# Patient Record
Sex: Female | Born: 1960 | Race: Black or African American | Hispanic: No | Smoking: Former smoker
Health system: Southern US, Community
[De-identification: ages and names within clinical notes are randomized; demographics above are authoritative.]

## PROBLEM LIST (undated history)

## (undated) DIAGNOSIS — I119 Hypertensive heart disease without heart failure: Secondary | ICD-10-CM

## (undated) DIAGNOSIS — G4733 Obstructive sleep apnea (adult) (pediatric): Secondary | ICD-10-CM

## (undated) DIAGNOSIS — R51 Headache: Secondary | ICD-10-CM

## (undated) DIAGNOSIS — D509 Iron deficiency anemia, unspecified: Secondary | ICD-10-CM

## (undated) DIAGNOSIS — M1990 Unspecified osteoarthritis, unspecified site: Secondary | ICD-10-CM

## (undated) DIAGNOSIS — I5032 Chronic diastolic (congestive) heart failure: Secondary | ICD-10-CM

## (undated) DIAGNOSIS — E119 Type 2 diabetes mellitus without complications: Secondary | ICD-10-CM

## (undated) DIAGNOSIS — R0789 Other chest pain: Secondary | ICD-10-CM

## (undated) DIAGNOSIS — N182 Chronic kidney disease, stage 2 (mild): Secondary | ICD-10-CM

## (undated) DIAGNOSIS — I10 Essential (primary) hypertension: Secondary | ICD-10-CM

## (undated) DIAGNOSIS — K219 Gastro-esophageal reflux disease without esophagitis: Secondary | ICD-10-CM

## (undated) HISTORY — PX: TUBAL LIGATION: SHX77

## (undated) HISTORY — PX: SHOULDER ARTHROSCOPY: SHX128

## (undated) HISTORY — DX: Essential (primary) hypertension: I10

## (undated) HISTORY — DX: Obstructive sleep apnea (adult) (pediatric): G47.33

## (undated) HISTORY — DX: Unspecified osteoarthritis, unspecified site: M19.90

## (undated) HISTORY — DX: Type 2 diabetes mellitus without complications: E11.9

## (undated) HISTORY — DX: Gastro-esophageal reflux disease without esophagitis: K21.9

---

## 2000-12-02 ENCOUNTER — Other Ambulatory Visit: Admission: RE | Admit: 2000-12-02 | Discharge: 2000-12-02 | Payer: Self-pay | Admitting: *Deleted

## 2001-06-08 ENCOUNTER — Encounter: Admission: RE | Admit: 2001-06-08 | Discharge: 2001-09-06 | Payer: Self-pay | Admitting: Family Medicine

## 2002-06-14 ENCOUNTER — Encounter: Admission: RE | Admit: 2002-06-14 | Discharge: 2002-09-12 | Payer: Self-pay | Admitting: Family Medicine

## 2002-09-15 ENCOUNTER — Encounter: Admission: RE | Admit: 2002-09-15 | Discharge: 2002-12-14 | Payer: Self-pay | Admitting: Family Medicine

## 2003-06-08 ENCOUNTER — Emergency Department (HOSPITAL_COMMUNITY): Admission: AD | Admit: 2003-06-08 | Discharge: 2003-06-08 | Payer: Self-pay | Admitting: Emergency Medicine

## 2003-08-01 ENCOUNTER — Other Ambulatory Visit: Admission: RE | Admit: 2003-08-01 | Discharge: 2003-08-01 | Payer: Self-pay | Admitting: Family Medicine

## 2003-08-08 ENCOUNTER — Encounter: Admission: RE | Admit: 2003-08-08 | Discharge: 2003-08-08 | Payer: Self-pay | Admitting: Family Medicine

## 2004-02-03 ENCOUNTER — Emergency Department (HOSPITAL_COMMUNITY): Admission: EM | Admit: 2004-02-03 | Discharge: 2004-02-03 | Payer: Self-pay | Admitting: Emergency Medicine

## 2004-09-24 ENCOUNTER — Encounter (INDEPENDENT_AMBULATORY_CARE_PROVIDER_SITE_OTHER): Payer: Self-pay | Admitting: *Deleted

## 2004-09-24 ENCOUNTER — Ambulatory Visit (HOSPITAL_COMMUNITY): Admission: RE | Admit: 2004-09-24 | Discharge: 2004-09-24 | Payer: Self-pay | Admitting: Obstetrics and Gynecology

## 2008-09-11 LAB — CONVERTED CEMR LAB: Pap Smear: NORMAL

## 2009-03-27 LAB — CONVERTED CEMR LAB: Pap Smear: NORMAL

## 2009-04-12 LAB — HM DIABETES EYE EXAM: HM Diabetic Eye Exam: NORMAL

## 2009-06-23 ENCOUNTER — Inpatient Hospital Stay (HOSPITAL_COMMUNITY): Admission: EM | Admit: 2009-06-23 | Discharge: 2009-06-24 | Payer: Self-pay | Admitting: Emergency Medicine

## 2009-08-05 ENCOUNTER — Encounter (INDEPENDENT_AMBULATORY_CARE_PROVIDER_SITE_OTHER): Payer: Self-pay | Admitting: *Deleted

## 2009-08-05 ENCOUNTER — Ambulatory Visit: Payer: Self-pay | Admitting: Internal Medicine

## 2009-08-05 DIAGNOSIS — K219 Gastro-esophageal reflux disease without esophagitis: Secondary | ICD-10-CM | POA: Insufficient documentation

## 2009-08-05 DIAGNOSIS — J45909 Unspecified asthma, uncomplicated: Secondary | ICD-10-CM | POA: Insufficient documentation

## 2009-08-05 DIAGNOSIS — E1129 Type 2 diabetes mellitus with other diabetic kidney complication: Secondary | ICD-10-CM | POA: Insufficient documentation

## 2009-08-05 DIAGNOSIS — M1990 Unspecified osteoarthritis, unspecified site: Secondary | ICD-10-CM | POA: Insufficient documentation

## 2009-08-05 DIAGNOSIS — J309 Allergic rhinitis, unspecified: Secondary | ICD-10-CM | POA: Insufficient documentation

## 2009-08-05 DIAGNOSIS — D539 Nutritional anemia, unspecified: Secondary | ICD-10-CM | POA: Insufficient documentation

## 2009-08-05 DIAGNOSIS — E1165 Type 2 diabetes mellitus with hyperglycemia: Secondary | ICD-10-CM

## 2009-08-05 DIAGNOSIS — I10 Essential (primary) hypertension: Secondary | ICD-10-CM | POA: Insufficient documentation

## 2009-08-05 LAB — CONVERTED CEMR LAB
ALT: 13 units/L (ref 0–35)
AST: 12 units/L (ref 0–37)
Albumin: 3.2 g/dL — ABNORMAL LOW (ref 3.5–5.2)
Alkaline Phosphatase: 77 units/L (ref 39–117)
BUN: 9 mg/dL (ref 6–23)
Basophils Absolute: 0 10*3/uL (ref 0.0–0.1)
Basophils Relative: 0.9 % (ref 0.0–3.0)
Bilirubin Urine: NEGATIVE
Bilirubin, Direct: 0.1 mg/dL (ref 0.0–0.3)
CO2: 27 meq/L (ref 19–32)
Calcium: 8.5 mg/dL (ref 8.4–10.5)
Chloride: 104 meq/L (ref 96–112)
Cholesterol: 156 mg/dL (ref 0–200)
Creatinine, Ser: 0.8 mg/dL (ref 0.4–1.2)
Creatinine,U: 126.5 mg/dL
Eosinophils Absolute: 0.2 10*3/uL (ref 0.0–0.7)
Eosinophils Relative: 3.5 % (ref 0.0–5.0)
Folate: 6.8 ng/mL
GFR calc non Af Amer: 80.6 mL/min (ref >60)
Glucose, Bld: 199 mg/dL — ABNORMAL HIGH (ref 70–99)
HCT: 34.8 % — ABNORMAL LOW (ref 36.0–46.0)
HDL: 56.5 mg/dL (ref >39.00)
Hemoglobin: 10.7 g/dL — ABNORMAL LOW (ref 12.0–15.0)
Hgb A1c MFr Bld: 11.8 % — ABNORMAL HIGH (ref 4.6–6.5)
Iron: 27 ug/dL — ABNORMAL LOW (ref 42–145)
Ketones, ur: NEGATIVE mg/dL
LDL Cholesterol: 92 mg/dL (ref 0–99)
Lymphocytes Relative: 28 % (ref 12.0–46.0)
Lymphs Abs: 1.5 10*3/uL (ref 0.7–4.0)
MCHC: 30.6 g/dL (ref 30.0–36.0)
MCV: 73.9 fL — ABNORMAL LOW (ref 78.0–100.0)
Microalb Creat Ratio: 36.4 mg/g — ABNORMAL HIGH (ref 0.0–30.0)
Microalb, Ur: 4.6 mg/dL — ABNORMAL HIGH (ref 0.0–1.9)
Monocytes Absolute: 0.6 10*3/uL (ref 0.1–1.0)
Monocytes Relative: 11.1 % (ref 3.0–12.0)
Neutro Abs: 3.2 10*3/uL (ref 1.4–7.7)
Neutrophils Relative %: 56.5 % (ref 43.0–77.0)
Nitrite: NEGATIVE
Platelets: 291 10*3/uL (ref 150.0–400.0)
Potassium: 4.4 meq/L (ref 3.5–5.1)
RBC: 4.71 M/uL (ref 3.87–5.11)
RDW: 16.8 % — ABNORMAL HIGH (ref 11.5–14.6)
Saturation Ratios: 6.6 % — ABNORMAL LOW (ref 20.0–50.0)
Sodium: 138 meq/L (ref 135–145)
Specific Gravity, Urine: 1.02 (ref 1.000–1.030)
TSH: 1.94 microintl units/mL (ref 0.35–5.50)
Total Bilirubin: 0.6 mg/dL (ref 0.3–1.2)
Total CHOL/HDL Ratio: 3
Total Protein, Urine: NEGATIVE mg/dL
Total Protein: 7.7 g/dL (ref 6.0–8.3)
Transferrin: 290.2 mg/dL (ref 212.0–360.0)
Triglycerides: 38 mg/dL (ref 0.0–149.0)
Urine Glucose: NEGATIVE mg/dL
Urobilinogen, UA: 0.2 (ref 0.0–1.0)
VLDL: 7.6 mg/dL (ref 0.0–40.0)
Vitamin B-12: 327 pg/mL (ref 211–911)
WBC: 5.5 10*3/uL (ref 4.5–10.5)
pH: 6.5 (ref 5.0–8.0)

## 2009-08-12 ENCOUNTER — Encounter (INDEPENDENT_AMBULATORY_CARE_PROVIDER_SITE_OTHER): Payer: Self-pay

## 2009-08-20 ENCOUNTER — Telehealth: Payer: Self-pay | Admitting: Internal Medicine

## 2009-08-20 ENCOUNTER — Ambulatory Visit: Payer: Self-pay | Admitting: Gastroenterology

## 2009-08-21 ENCOUNTER — Ambulatory Visit: Payer: Self-pay | Admitting: Internal Medicine

## 2009-08-26 ENCOUNTER — Telehealth: Payer: Self-pay | Admitting: Internal Medicine

## 2009-08-27 ENCOUNTER — Telehealth: Payer: Self-pay | Admitting: Internal Medicine

## 2009-08-28 ENCOUNTER — Encounter: Payer: Self-pay | Admitting: Internal Medicine

## 2009-09-03 ENCOUNTER — Telehealth: Payer: Self-pay | Admitting: Internal Medicine

## 2009-10-08 ENCOUNTER — Ambulatory Visit: Payer: Self-pay | Admitting: Internal Medicine

## 2009-10-27 ENCOUNTER — Emergency Department (HOSPITAL_COMMUNITY): Admission: EM | Admit: 2009-10-27 | Discharge: 2009-10-27 | Payer: Self-pay | Admitting: Emergency Medicine

## 2009-10-30 ENCOUNTER — Ambulatory Visit: Payer: Self-pay | Admitting: Internal Medicine

## 2009-10-30 LAB — CONVERTED CEMR LAB
Basophils Absolute: 0 10*3/uL (ref 0.0–0.1)
Basophils Relative: 0.4 % (ref 0.0–3.0)
Cholesterol: 139 mg/dL (ref 0–200)
Eosinophils Absolute: 0.3 10*3/uL (ref 0.0–0.7)
Eosinophils Relative: 4.4 % (ref 0.0–5.0)
HCT: 34.1 % — ABNORMAL LOW (ref 36.0–46.0)
HDL: 55.6 mg/dL (ref >39.00)
Hemoglobin: 10.9 g/dL — ABNORMAL LOW (ref 12.0–15.0)
Hgb A1c MFr Bld: 7.5 % — ABNORMAL HIGH (ref 4.6–6.5)
Iron: 20 ug/dL — ABNORMAL LOW (ref 42–145)
LDL Cholesterol: 73 mg/dL (ref 0–99)
Lymphocytes Relative: 23.4 % (ref 12.0–46.0)
Lymphs Abs: 1.6 10*3/uL (ref 0.7–4.0)
MCHC: 32.1 g/dL (ref 30.0–36.0)
MCV: 71 fL — ABNORMAL LOW (ref 78.0–100.0)
Monocytes Absolute: 0.5 10*3/uL (ref 0.1–1.0)
Monocytes Relative: 6.7 % (ref 3.0–12.0)
Neutro Abs: 4.4 10*3/uL (ref 1.4–7.7)
Neutrophils Relative %: 65.1 % (ref 43.0–77.0)
Platelets: 288 10*3/uL (ref 150.0–400.0)
RBC: 4.8 M/uL (ref 3.87–5.11)
RDW: 19.2 % — ABNORMAL HIGH (ref 11.5–14.6)
Saturation Ratios: 5 % — ABNORMAL LOW (ref 20.0–50.0)
Total CHOL/HDL Ratio: 3
Transferrin: 286.2 mg/dL (ref 212.0–360.0)
Triglycerides: 53 mg/dL (ref 0.0–149.0)
VLDL: 10.6 mg/dL (ref 0.0–40.0)
WBC: 6.7 10*3/uL (ref 4.5–10.5)

## 2010-01-01 ENCOUNTER — Ambulatory Visit: Payer: Self-pay | Admitting: Internal Medicine

## 2010-01-01 LAB — CONVERTED CEMR LAB
BUN: 18 mg/dL (ref 6–23)
Basophils Absolute: 0 10*3/uL (ref 0.0–0.1)
Basophils Relative: 0.4 % (ref 0.0–3.0)
CO2: 28 meq/L (ref 19–32)
Calcium: 9.4 mg/dL (ref 8.4–10.5)
Chloride: 106 meq/L (ref 96–112)
Creatinine, Ser: 0.9 mg/dL (ref 0.4–1.2)
Eosinophils Absolute: 0.2 10*3/uL (ref 0.0–0.7)
Eosinophils Relative: 3.3 % (ref 0.0–5.0)
GFR calc non Af Amer: 71.15 mL/min (ref >60)
Glucose, Bld: 81 mg/dL (ref 70–99)
HCT: 33.2 % — ABNORMAL LOW (ref 36.0–46.0)
Hemoglobin: 10.6 g/dL — ABNORMAL LOW (ref 12.0–15.0)
Hgb A1c MFr Bld: 7.6 % — ABNORMAL HIGH (ref 4.6–6.5)
Lymphocytes Relative: 22.8 % (ref 12.0–46.0)
Lymphs Abs: 1.4 10*3/uL (ref 0.7–4.0)
MCHC: 32.1 g/dL (ref 30.0–36.0)
MCV: 68.9 fL — ABNORMAL LOW (ref 78.0–100.0)
Monocytes Absolute: 0.4 10*3/uL (ref 0.1–1.0)
Monocytes Relative: 6.5 % (ref 3.0–12.0)
Neutro Abs: 4.1 10*3/uL (ref 1.4–7.7)
Neutrophils Relative %: 67 % (ref 43.0–77.0)
Platelets: 285 10*3/uL (ref 150.0–400.0)
Potassium: 4.4 meq/L (ref 3.5–5.1)
RBC: 4.81 M/uL (ref 3.87–5.11)
RDW: 18.6 % — ABNORMAL HIGH (ref 11.5–14.6)
Sodium: 141 meq/L (ref 135–145)
WBC: 6.2 10*3/uL (ref 4.5–10.5)

## 2010-03-20 ENCOUNTER — Telehealth: Payer: Self-pay | Admitting: Internal Medicine

## 2010-08-26 NOTE — Progress Notes (Signed)
Summary: Samples & RX  Phone Note Call from Patient Call back at [phone] - OK VM    Summary of Call: Patient is requesting samples of janumet & benicar. Also will need Rx.  Initial call taken by: Lamar Sprinkles, CMA,  March 20, 2010 11:05 AM  Follow-up for Phone Call        Left mess for pt that samples are upfront and rx is ready at pharmacy Follow-up by: Lamar Sprinkles, CMA,  March 21, 2010 5:37 PM    Prescriptions: BENICAR 20 MG TABS (OLMESARTAN MEDOXOMIL) One by mouth once daily for high blood pressure  #90 x 0   Entered by:   Lamar Sprinkles, CMA   Authorized by:   Etta Grandchild MD   Signed by:   Lamar Sprinkles, CMA on 03/20/2010   Method used:   Electronically to        [facility] [phone]* (retail)       [address]       [place], [place]  [address]       Ph: [phone]       Fax: [phone]   RxID:   [id number] JANUMET 50-1000 MG TABS (SITAGLIPTIN-METFORMIN HCL) One by mouth two times a day for diabetes  #180 x 0   Entered by:   Lamar Sprinkles, CMA   Authorized by:   Etta Grandchild MD   Signed by:   Lamar Sprinkles, CMA on 03/20/2010   Method used:   Electronically to        [facility] [phone]* (retail)       [address]       [place], [place]  [address]       Ph: [phone]       Fax: [phone]   RxID:   [id number]

## 2010-08-26 NOTE — Letter (Signed)
Summary: Previsit letter  [facility]  [address] [place], [place] [address]   Phone: [phone]  Fax: [phone]       08/05/2009 MRN: [id number]  [facility] [address] [place], [place]  [address]  Dear Lori Mclaughlin,  Welcome to the Gastroenterology Division at [facility].    You are scheduled to see a nurse for your pre-procedure visit on 08-14-09 at 10:00a.m. on the 3rd floor at [facility], [address]  We ask that you try to arrive at our office 15 minutes prior to your appointment time to allow for check-in.  Your nurse visit will consist of discussing your medical and surgical history, your immediate family medical history, and your medications.    Please bring a complete list of all your medications or, if you prefer, bring the medication bottles and we will list them.  We will need to be aware of both prescribed and over the counter drugs.  We will need to know exact dosage information as well.  If you are on blood thinners (Coumadin, Plavix, Aggrenox, Ticlid, etc.) please call our office today/prior to your appointment, as we need to consult with your physician about holding your medication.   Please be prepared to read and sign documents such as consent forms, a financial agreement, and acknowledgement forms.  If necessary, and with your consent, a friend or relative is welcome to sit-in on the nurse visit with you.  Please bring your insurance card so that we may make a copy of it.  If your insurance requires a referral to see a specialist, please bring your referral form from your primary care physician.  No co-pay is required for this nurse visit.     If you cannot keep your appointment, please call [phone] to cancel or reschedule prior to your appointment date.  This allows [place] the opportunity to schedule an appointment for another patient in need of care.    Thank you for choosing [facility] for your  medical needs.  We appreciate the opportunity to care for you.  Please visit [place] at our website  to learn more about our practice.                     Sincerely.                                                                                                                   The Gastroenterology Division

## 2010-08-26 NOTE — Miscellaneous (Signed)
Summary: Lec previsit  Clinical Lists Changes  Medications: Added new medication of MOVIPREP 100 GM  SOLR (PEG-KCL-NACL-NASULF-NA ASC-C) As per prep instructions. - Signed Rx of MOVIPREP 100 GM  SOLR (PEG-KCL-NACL-NASULF-NA ASC-C) As per prep instructions.;  #1 x 0;  Signed;  Entered by: Ulis Rias RN;  Authorized by: Rachael Fee MD;  Method used: Electronically to [facility] [phone]*, [address] [place], [place]  [address], Ph: [phone], Fax: [phone] Allergies: Added new allergy or adverse reaction of * LATEX    Prescriptions: MOVIPREP 100 GM  SOLR (PEG-KCL-NACL-NASULF-NA ASC-C) As per prep instructions.  #1 x 0   Entered by:   Ulis Rias RN   Authorized by:   Rachael Fee MD   Signed by:   Ulis Rias RN on 08/20/2009   Method used:   Electronically to        [facility] [phone]* (retail)       [address]       [place], [place]  [address]       Ph: [phone]       Fax: [phone]   RxID:   [id number]

## 2010-08-26 NOTE — Medication Information (Signed)
Summary: Denial/[employer]  Denial/[employer]   Imported By: Lester [place] 09/04/2009 08:48:23  _____________________________________________________________________  External Attachment:    Type:   Image     Comment:   External Document

## 2010-08-26 NOTE — Progress Notes (Signed)
Summary: Test strips  Phone Note From Pharmacy   Summary of Call: Test strips were denied, would you like to change qty of the prescription? (see previous note) Initial call taken by: Lucious Groves,  September 03, 2009 11:48 AM  Follow-up for Phone Call        sure Follow-up by: Etta Grandchild MD,  September 03, 2009 6:30 PM  Additional Follow-up for Phone Call Additional follow up Details #1::        Done thanks.  Left message on voicemail to call back to office. Additional Follow-up by: Lucious Groves,  September 04, 2009 4:36 PM    Additional Follow-up for Phone Call Additional follow up Details #2::    patient notified. Follow-up by: Lucious Groves,  September 04, 2009 5:03 PM  New/Updated Medications: FREESTYLE LITE TEST  STRP (GLUCOSE BLOOD) Test up to 2 times daily DX 250.00 Prescriptions: FREESTYLE LITE TEST  STRP (GLUCOSE BLOOD) Test up to 2 times daily DX 250.00  #153 x 3   Entered by:   Lucious Groves   Authorized by:   Etta Grandchild MD   Signed by:   Lucious Groves on 09/04/2009   Method used:   Electronically to        [facility] [phone]* (retail)       [address]       [place], [place]  [address]       Ph: [phone]       Fax: [phone]   RxID:   [id number]

## 2010-08-26 NOTE — Letter (Signed)
Summary: Diabetic Instructions   Gastroenterology  [address] [place], [place] [address]   Phone: [phone]  Fax: [phone]    Lori Mclaughlin 1960/11/17 MRN: [id number]   _  _   ORAL DIABETIC MEDICATION INSTRUCTIONS  The day before your procedure:   Take your diabetic pill as you do normally  The day of your procedure:   Do not take your diabetic pill    We will check your blood sugar levels during the admission process and again in Recovery before discharging you home  ________________________________________________________________________  _  _   INSULIN (LONG ACTING) MEDICATION INSTRUCTIONS (Lantus, NPH, 70/30, Humulin, Novolin-N)   The day before your procedure:   Take  your regular evening dose    The day of your procedure:   Do not take your morning dose    _  _   INSULIN (SHORT ACTING) MEDICATION INSTRUCTIONS (Regular, Humulog, Novolog)   The day before your procedure:   Do not take your evening dose   The day of your procedure:   Do not take your morning dose   _  _   INSULIN PUMP MEDICATION INSTRUCTIONS  We will contact the physician managing your diabetic care for written dosage instructions for the day before your procedure and the day of your procedure.  Once we have received the instructions, we will contact you.

## 2010-08-26 NOTE — Medication Information (Signed)
Summary: Prior Auth for [employer] Strips/[employer]  Prior Auth for [employer] Strips/[employer]   Imported By: Sherian Rein 08/30/2009 15:00:20  _____________________________________________________________________  External Attachment:    Type:   Image     Comment:   External Document

## 2010-08-26 NOTE — Assessment & Plan Note (Signed)
Summary: FU AND A1C/NWS   Vital Signs:  Patient profile:   50 year old female Height:      61 inches Weight:      283.75 pounds BMI:     53.81 O2 Sat:      98 % on Room air Temp:     98.4 degrees F oral Pulse rate:   90 / minute Pulse rhythm:   regular Resp:     16 per minute BP sitting:   140 / 80  (left arm) Cuff size:   large  Vitals Entered By: Rock Nephew CMA (January 01, 2010 8:54 AM)  Nutrition Counseling: Patient's BMI is greater than 25 and therefore counseled on weight management options.  O2 Flow:  Room air  Primary Care Provider:  Etta Grandchild MD   History of Present Illness:  Follow-Up Visit      This is a 50 year old woman who presents for Follow-up visit.  The patient complains of low blood sugar symptoms with recent AM FBS= 62 and 54, but denies chest pain, palpitations, dizziness, syncope, high blood sugar symptoms, edema, SOB, DOE, PND, and orthopnea.  Since the last visit the patient notes problems with medications.  The patient reports taking meds as prescribed, monitoring BP, and monitoring blood sugars.  When questioned about possible medication side effects, the patient notes none.    Hypertension History:      She denies headache, chest pain, palpitations, dyspnea with exertion, orthopnea, PND, peripheral edema, visual symptoms, neurologic problems, syncope, and side effects from treatment.  She notes no problems with any antihypertensive medication side effects.        Positive major cardiovascular risk factors include diabetes and hypertension.  Negative major cardiovascular risk factors include female age less than 34 years old, no history of hyperlipidemia, negative family history for ischemic heart disease, and non-tobacco-user status.        Further assessment for target organ damage reveals no history of ASHD, cardiac end-organ damage (CHF/LVH), stroke/TIA, peripheral vascular disease, renal insufficiency, or hypertensive retinopathy.     Current  Medications (verified): 1)  Glipizide 10 Mg Tabs (Glipizide) .... Take 1 Tablet By Mouth Two Times A Day 2)  Catapres 0.1 Mg Tabs (Clonidine Hcl) .... One By Mouth Three Times A Day 3)  Metformin Hcl 1000 Mg Tabs (Metformin Hcl) .... Take 1 Tablet By Mouth Two Times A Day 4)  Proair Hfa 108 (90 Base) Mcg/act Aers (Albuterol Sulfate) 5)  Cimetidine 200 Mg Tabs (Cimetidine) .... One By Mouth Bid 6)  Zyrtec Allergy 10 Mg Caps (Cetirizine Hcl) .... One By Mouth Once Daily 7)  Freestyle Lancets  Misc (Lancets) .... Test Up To Two Times A Day As Needed Dx 250.00 8)  Benicar 20 Mg Tabs (Olmesartan Medoxomil) .... One By Mouth Once Daily For High Blood Pressure 9)  Bee Pollen  Allergies (verified): 1)  ! * Lisinopril 2)  ! * Latex  Past History:  Past Medical History: Reviewed history from 08/05/2009 and no changes required. Allergic rhinitis Anemia-NOS Asthma Diabetes mellitus, type II GERD Hypertension Osteoarthritis  Past Surgical History: Reviewed history from 08/05/2009 and no changes required. Tubal ligation  Family History: Reviewed history from 08/05/2009 and no changes required. Family History of Colon CA 1st degree relative <60 Family History Diabetes 1st degree relative Family History Hypertension Family History of Prostate CA 1st degree relative <50 Family History of Stroke F 1st degree relative <60  Social History: Reviewed history from 08/05/2009  and no changes required. Occupation: CSR at [employer] Married Alcohol use-no Drug use-no Regular exercise-yes  Review of Systems       The patient complains of weight gain.  The patient denies anorexia, fever, weight loss, hoarseness, chest pain, syncope, dyspnea on exertion, peripheral edema, prolonged cough, headaches, hemoptysis, abdominal pain, hematuria, difficulty walking, and depression.   Endo:  Complains of weight change; denies cold intolerance, excessive hunger, excessive thirst, excessive urination, heat  intolerance, and polyuria. Heme:  Denies abnormal bruising, bleeding, enlarge lymph nodes, fevers, pallor, and skin discoloration.  Physical Exam  General:  alert, well-developed, well-nourished, well-hydrated, appropriate dress, cooperative to examination, good hygiene, and overweight-appearing.   Mouth:  Oral mucosa and oropharynx without lesions or exudates.  Teeth in good repair. Neck:  supple, full ROM, no masses, no thyromegaly, no JVD, normal carotid upstroke, and no carotid bruits.   Lungs:  normal respiratory effort, no intercostal retractions, no accessory muscle use, normal breath sounds, no dullness, no fremitus, no crackles, and no wheezes.   Heart:  normal rate, regular rhythm, no murmur, no gallop, no rub, and no JVD.   Abdomen:  soft, non-tender, normal bowel sounds, no distention, no masses, no guarding, no rigidity, no hepatomegaly, and no splenomegaly.   Msk:  No deformity or scoliosis noted of thoracic or lumbar spine.   Pulses:  R and L carotid,radial,femoral,dorsalis pedis and posterior tibial pulses are full and equal bilaterally Extremities:  No clubbing, cyanosis, edema, or deformity noted with normal full range of motion of all joints.   Neurologic:  No cranial nerve deficits noted. Station and gait are normal. Plantar reflexes are down-going bilaterally. DTRs are symmetrical throughout. Sensory, motor and coordinative functions appear intact. Skin:  Intact without suspicious lesions or rashes Cervical Nodes:  no anterior cervical adenopathy and no posterior cervical adenopathy.   Axillary Nodes:  no R axillary adenopathy and no L axillary adenopathy.   Psych:  Cognition and judgment appear intact. Alert and cooperative with normal attention span and concentration. No apparent delusions, illusions, hallucinations  Diabetes Management Exam:    Foot Exam (with socks and/or shoes not present):       Sensory-Pinprick/Light touch:          Left medial foot (L-4): normal           Left dorsal foot (L-5): normal          Left lateral foot (S-1): normal          Right medial foot (L-4): normal          Right dorsal foot (L-5): normal          Right lateral foot (S-1): normal       Sensory-Monofilament:          Left foot: normal          Right foot: normal       Inspection:          Left foot: normal          Right foot: normal       Nails:          Left foot: normal          Right foot: normal   Impression & Recommendations:  Problem # 1:  DIABETES MELLITUS, TYPE II (ICD-250.00) Assessment Deteriorated wilil stop glipizide due to hypoglycemia  The following medications were removed from the medication list:    Glipizide 10 Mg Tabs (Glipizide) .[name]... Take 1 tablet by mouth two  times a day    Metformin Hcl 1000 Mg Tabs (Metformin hcl) .[name]... Take 1 tablet by mouth two times a day Her updated medication list for this problem includes:    Benicar 20 Mg Tabs (Olmesartan medoxomil) ..... One by mouth once daily for high blood pressure    Janumet 50-1000 Mg Tabs (Sitagliptin-metformin hcl) ..... One by mouth two times a day for diabetes  Orders: TLB-CBC Platelet - w/Differential (85025-CBCD) TLB-BMP (Basic Metabolic Panel-BMET) (80048-METABOL) TLB-A1C / Hgb A1C (Glycohemoglobin) (83036-A1C)  Labs Reviewed: Creat: 0.8 (08/05/2009)     Last Eye Exam: normal (04/12/2009) Reviewed HgBA1c results: 7.5 (10/30/2009)  11.8 (08/05/2009)  Problem # 2:  HYPERTENSION (ICD-401.9) Assessment: Improved  Her updated medication list for this problem includes:    Catapres 0.1 Mg Tabs (Clonidine hcl) ..... One by mouth three times a day    Benicar 20 Mg Tabs (Olmesartan medoxomil) ..... One by mouth once daily for high blood pressure  BP today: 140/80 Prior BP: 134/80 (10/30/2009)  10 Yr Risk Heart Disease: 13 % Prior 10 Yr Risk Heart Disease: 9 % (10/30/2009)  Labs Reviewed: K+: 4.4 (08/05/2009) Creat: : 0.8 (08/05/2009)   Chol: 139 (10/30/2009)   HDL: 55.60  (10/30/2009)   LDL: 73 (10/30/2009)   TG: 53.0 (10/30/2009)  Problem # 3:  ANEMIA-NOS (ICD-285.9) Assessment: Unchanged  Orders: TLB-CBC Platelet - w/Differential (85025-CBCD) TLB-BMP (Basic Metabolic Panel-BMET) (80048-METABOL) TLB-A1C / Hgb A1C (Glycohemoglobin) (83036-A1C)  Hgb: 10.9 (10/30/2009)   Hct: 34.1 (10/30/2009)   Platelets: 288.0 (10/30/2009) RBC: 4.80 (10/30/2009)   RDW: 19.2 (10/30/2009)   WBC: 6.7 (10/30/2009) MCV: 71.0 (10/30/2009)   MCHC: 32.1 (10/30/2009) Iron: 20 (10/30/2009)   % Sat: 5.0 (10/30/2009) B12: 327 (08/05/2009)   Folate: 6.8 (08/05/2009)   TSH: 1.94 (08/05/2009)  Complete Medication List: 1)  Catapres 0.1 Mg Tabs (Clonidine hcl) .... One by mouth three times a day 2)  Proair Hfa 108 (90 Base) Mcg/act Aers (Albuterol sulfate) 3)  Cimetidine 200 Mg Tabs (Cimetidine) .... One by mouth bid 4)  Zyrtec Allergy 10 Mg Caps (Cetirizine hcl) .... One by mouth once daily 5)  Freestyle Lancets Misc (Lancets) .... Test up to two times a day as needed dx 250.00 6)  Benicar 20 Mg Tabs (Olmesartan medoxomil) .... One by mouth once daily for high blood pressure 7)  Bee Pollen  8)  Janumet 50-1000 Mg Tabs (Sitagliptin-metformin hcl) .... One by mouth two times a day for diabetes  Hypertension Assessment/Plan:      The patient's hypertensive risk group is category C: Target organ damage and/or diabetes.  Her calculated 10 year risk of coronary heart disease is 13 %.  Today's blood pressure is 140/80.  Her blood pressure goal is < 130/80.  Patient Instructions: 1)  Please schedule a follow-up appointment in 2 months. 2)  It is important that you exercise regularly at least 20 minutes 5 times a week. If you develop chest pain, have severe difficulty breathing, or feel very tired , stop exercising immediately and seek medical attention. 3)  You need to lose weight. Consider a lower calorie diet and regular exercise.  4)  Check your blood sugars regularly. If your readings  are usually above 200  or below 70 you should contact our office. 5)  It is important that your Diabetic A1c level is checked every 3 months. 6)  See your eye doctor yearly to check for diabetic eye damage. 7)  Check your feet each night for sore areas, calluses or  signs of infection. 8)  Check your Blood Pressure regularly. If it is above 130/80: you should make an appointment. Prescriptions: JANUMET 50-1000 MG TABS (SITAGLIPTIN-METFORMIN HCL) One by mouth two times a day for diabetes  #140 x 0   Entered and Authorized by:   Etta Grandchild MD   Signed by:   Etta Grandchild MD on 01/01/2010   Method used:   Samples Given   RxID:   [id number]

## 2010-08-26 NOTE — Progress Notes (Signed)
Summary: FMLA  Phone Note Call from Patient Call back at [facility] Phone [phone] Call back at [phone]   Caller: Patient Call For: Etta Grandchild MD Summary of Call: Pt came into the office to dropped off a FMLA form for Dr. Yetta Barre to complete. Put paper work in [employer].  Initial call taken by: Livingston Diones,  August 20, 2009 2:32 PM  Follow-up for Phone Call        paperwork completed and sent for MD signature/review. **Filled out paperwork for patient only--attached paperwork for family member does not need to be done by Dr. Yetta Barre. Follow-up by: Lucious Groves,  August 20, 2009 3:01 PM  Additional Follow-up for Phone Call Additional follow up Details #1::        Paperwork completed. Called cell and Cricket member  is not avail. Called home # and was told patient is not there. Will try cell again later. Additional Follow-up by: Lucious Groves,  August 22, 2009 11:54 AM    Additional Follow-up for Phone Call Additional follow up Details #2::    Left message with the lady who answered for patient to call the office.Lucious Groves,  August 23, 2009 11:15 AM  No respone from patient, called cell #, left message on voicemail to call back to office.Lucious Groves,  August 27, 2009 2:40 PM  Additional Follow-up for Phone Call Additional follow up Details #3:: Details for Additional Follow-up Action Taken: Patient is aware that paperwork was completed. Per patient request I faxed it Attn: Scharlene Gloss and mailed copy to patient. **Copy sent to scanning. Additional Follow-up by: Lucious Groves,  August 27, 2009 3:14 PM

## 2010-08-26 NOTE — Progress Notes (Signed)
Summary: TESTING SUPPLIES  Phone Note Call from Patient Call back at Home Phone [phone]   Summary of Call: Pt is req refills of Freestyle lite test strips and lancets to [facility] on [address]. Initial call taken by: Lamar Sprinkles, CMA,  August 26, 2009 5:34 PM    New/Updated Medications: FREESTYLE LITE TEST  STRP (GLUCOSE BLOOD) Test up to 2 times daily DX 250.00 FREESTYLE LANCETS  MISC (LANCETS) Test up to two times a day as needed DX 250.00 Prescriptions: FREESTYLE LANCETS  MISC (LANCETS) Test up to two times a day as needed DX 250.00  #3 mth x 1   Entered by:   Lamar Sprinkles, CMA   Authorized by:   Etta Grandchild MD   Signed by:   Lamar Sprinkles, CMA on 08/26/2009   Method used:   Electronically to        [facility] [phone]* (retail)       [address]       [place], [place]  [address]       Ph: [phone]       Fax: [phone]   RxID:   [id number] FREESTYLE LITE TEST  STRP (GLUCOSE BLOOD) Test up to 2 times daily DX 250.00  #3 mth x 1   Entered by:   Lamar Sprinkles, CMA   Authorized by:   Etta Grandchild MD   Signed by:   Lamar Sprinkles, CMA on 08/26/2009   Method used:   Electronically to        [facility] [phone]* (retail)       [address]       [place], [place]  [address]       Ph: [phone]       Fax: [phone]   RxID:   [id number]

## 2010-08-26 NOTE — Letter (Signed)
Summary: Lipid Letter  [facility]  [address] [place], [place] [address]   Phone: [phone]  Fax: [phone]    08/05/2009  [facility] [address] [place], [place]  [address]  Dear Lori Mclaughlin:  We have carefully reviewed your last lipid profile from 08/05/2009 and the results are noted below with a summary of recommendations for lipid management.    Cholesterol:       156     Goal: <200   HDL "good" Cholesterol:   69.62     Goal: >40   LDL "bad" Cholesterol:   92     Goal: <130   Triglycerides:       38.0     Goal: <150        TLC Diet (Therapeutic Lifestyle Change): Saturated Fats & Transfatty acids should be kept < 7% of total calories ***Reduce Saturated Fats Polyunstaurated Fat can be up to 10% of total calories Monounsaturated Fat Fat can be up to 20% of total calories Total Fat should be no greater than 25-35% of total calories Carbohydrates should be 50-60% of total calories Protein should be approximately 15% of total calories Fiber should be at least 20-30 grams a day ***Increased fiber may help lower LDL Total Cholesterol should be < 200mg /day Consider adding plant stanol/sterols to diet (example: Benacol spread) ***A higher intake of unsaturated fat may reduce Triglycerides and Increase HDL    Adjunctive Measures (may lower LIPIDS and reduce risk of Heart Attack) include: Aerobic Exercise (20-30 minutes 3-4 times a week) Limit Alcohol Consumption Weight Reduction Aspirin 75-81 mg a day by mouth (if not allergic or contraindicated) Dietary Fiber 20-30 grams a day by mouth     Current Medications: 1)    Amlodipine Besylate 5 Mg Tabs (Amlodipine besylate) .... Once daily 2)    Glipizide 10 Mg Tabs (Glipizide) .... Take 1 tablet by mouth two times a day 3)    Catapres 0.1 Mg Tabs (Clonidine hcl) .... One by mouth three times a day 4)    Metformin Hcl 1000 Mg Tabs (Metformin hcl) .... Take 1 tablet by mouth two times a day 5)    Proair  Hfa 108 (90 Base) Mcg/act Aers (Albuterol sulfate) 6)    Cimetidine 200 Mg Tabs (Cimetidine) .... One by mouth bid 7)    Zyrtec Allergy 10 Mg Caps (Cetirizine hcl) .... One by mouth once daily  If you have any questions, please call. We appreciate being able to work with you.   Sincerely,     [facility] Etta Grandchild MD

## 2010-08-26 NOTE — Assessment & Plan Note (Signed)
Summary: ER FU/NWS  #   Vital Signs:  Patient profile:   50 year old female Height:      61 inches Weight:      282 pounds BMI:     53.48 O2 Sat:      97 % on Room air Temp:     97.9 degrees F oral Pulse rate:   65 / minute Pulse rhythm:   regular Resp:     16 per minute BP sitting:   134 / 80  (left arm) Cuff size:   large  Vitals Entered By: Rock Nephew CMA (October 30, 2009 8:42 AM)  Nutrition Counseling: Patient's BMI is greater than 25 and therefore counseled on weight management options.  O2 Flow:  Room air CC: ER follow up for dizzines, weakness, and chest pain, Hypertension Management Is Patient Diabetic? Yes Did you bring your meter with you today? No Pain Assessment Patient in pain? no        Primary Care Provider:  Etta Grandchild MD  CC:  ER follow up for dizzines, weakness, and chest pain, and Hypertension Management.  History of Present Illness: She returns for f/up. She went to the ER 3 days ago for sharp chest pain related to stress and an elevated BP. She has felt well since then with the exception of chronic fatigue.  Hypertension History:      She complains of side effects from treatment, but denies headache, chest pain, palpitations, dyspnea with exertion, orthopnea, PND, peripheral edema, visual symptoms, neurologic problems, and syncope.  She notes the following problems with antihypertensive medication side effects: dizzy after amlodipine.        Positive major cardiovascular risk factors include diabetes and hypertension.  Negative major cardiovascular risk factors include female age less than 28 years old, no history of hyperlipidemia, negative family history for ischemic heart disease, and non-tobacco-user status.        Further assessment for target organ damage reveals no history of ASHD, cardiac end-organ damage (CHF/LVH), stroke/TIA, peripheral vascular disease, renal insufficiency, or hypertensive retinopathy.     Allergies: 1)  ! *  Lisinopril 2)  ! * Latex  Past History:  Past Medical History: Reviewed history from 08/05/2009 and no changes required. Allergic rhinitis Anemia-NOS Asthma Diabetes mellitus, type II GERD Hypertension Osteoarthritis  Past Surgical History: Reviewed history from 08/05/2009 and no changes required. Tubal ligation  Family History: Reviewed history from 08/05/2009 and no changes required. Family History of Colon CA 1st degree relative <60 Family History Diabetes 1st degree relative Family History Hypertension Family History of Prostate CA 1st degree relative <50 Family History of Stroke F 1st degree relative <60  Social History: Reviewed history from 08/05/2009 and no changes required. Occupation: CSR at [employer] Married Alcohol use-no Drug use-no Regular exercise-yes  Review of Systems       The patient complains of weight gain.  The patient denies anorexia, fever, chest pain, syncope, dyspnea on exertion, peripheral edema, prolonged cough, headaches, hemoptysis, abdominal pain, hematuria, difficulty walking, and depression.   Endo:  Complains of weight change; denies cold intolerance, excessive hunger, excessive thirst, excessive urination, heat intolerance, and polyuria. Heme:  Denies abnormal bruising, bleeding, enlarge lymph nodes, fevers, pallor, and skin discoloration.  Physical Exam  General:  alert, well-developed, well-nourished, well-hydrated, appropriate dress, cooperative to examination, good hygiene, and overweight-appearing.   Head:  normocephalic, atraumatic, no abnormalities observed, and no abnormalities palpated.   Eyes:  No corneal or conjunctival inflammation noted. EOMI. Perrla.  Funduscopic exam benign, without hemorrhages, exudates or papilledema. Vision grossly normal. Mouth:  Oral mucosa and oropharynx without lesions or exudates.  Teeth in good repair. Neck:  supple, full ROM, no masses, no thyromegaly, no JVD, normal carotid upstroke, and no carotid  bruits.   Lungs:  normal respiratory effort, no intercostal retractions, no accessory muscle use, normal breath sounds, no dullness, no fremitus, no crackles, and no wheezes.   Heart:  normal rate, regular rhythm, no murmur, no gallop, no rub, and no JVD.   Abdomen:  soft, non-tender, normal bowel sounds, no distention, no masses, no guarding, no rigidity, no hepatomegaly, and no splenomegaly.   Msk:  No deformity or scoliosis noted of thoracic or lumbar spine.   Pulses:  R and L carotid,radial,femoral,dorsalis pedis and posterior tibial pulses are full and equal bilaterally Extremities:  No clubbing, cyanosis, edema, or deformity noted with normal full range of motion of all joints.   Neurologic:  No cranial nerve deficits noted. Station and gait are normal. Plantar reflexes are down-going bilaterally. DTRs are symmetrical throughout. Sensory, motor and coordinative functions appear intact. Skin:  Intact without suspicious lesions or rashes Cervical Nodes:  no anterior cervical adenopathy and no posterior cervical adenopathy.   Axillary Nodes:  no R axillary adenopathy and no L axillary adenopathy.   Inguinal Nodes:  no R inguinal adenopathy and no L inguinal adenopathy.   Psych:  Cognition and judgment appear intact. Alert and cooperative with normal attention span and concentration. No apparent delusions, illusions, hallucinations  Diabetes Management Exam:    Foot Exam (with socks and/or shoes not present):       Sensory-Pinprick/Light touch:          Left medial foot (L-4): normal          Left dorsal foot (L-5): normal          Left lateral foot (S-1): normal          Right medial foot (L-4): normal          Right dorsal foot (L-5): normal          Right lateral foot (S-1): normal       Sensory-Monofilament:          Left foot: normal          Right foot: normal       Inspection:          Left foot: normal          Right foot: normal       Nails:          Left foot: normal           Right foot: normal   Impression & Recommendations:  Problem # 1:  HYPERTENSION (ICD-401.9) Assessment Unchanged  The following medications were removed from the medication list:    Amlodipine Besylate 5 Mg Tabs (Amlodipine besylate) ..... Once daily Her updated medication list for this problem includes:    Catapres 0.1 Mg Tabs (Clonidine hcl) ..... One by mouth three times a day    Benicar 20 Mg Tabs (Olmesartan medoxomil) ..... One by mouth once daily for high blood pressure  Orders: TLB-Lipid Panel (80061-LIPID) TLB-A1C / Hgb A1C (Glycohemoglobin) (83036-A1C) TLB-IBC Pnl (Iron/FE;Transferrin) (83550-IBC) TLB-CBC Platelet - w/Differential (85025-CBCD)  Problem # 2:  DIABETES MELLITUS, TYPE II (ICD-250.00) Assessment: Deteriorated  Her updated medication list for this problem includes:    Glipizide 10 Mg Tabs (Glipizide) .[name]... Take 1 tablet by mouth two times  a day    Metformin Hcl 1000 Mg Tabs (Metformin hcl) .[name]... Take 1 tablet by mouth two times a day    Benicar 20 Mg Tabs (Olmesartan medoxomil) ..... One by mouth once daily for high blood pressure  Orders: TLB-Lipid Panel (80061-LIPID) TLB-A1C / Hgb A1C (Glycohemoglobin) (83036-A1C) TLB-IBC Pnl (Iron/FE;Transferrin) (83550-IBC) TLB-CBC Platelet - w/Differential (85025-CBCD)  Labs Reviewed: Creat: 0.8 (08/05/2009)     Last Eye Exam: normal (04/12/2009) Reviewed HgBA1c results: 11.8 (08/05/2009)  Problem # 3:  ANEMIA-NOS (ICD-285.9) Assessment: Unchanged  Orders: TLB-Lipid Panel (80061-LIPID) TLB-A1C / Hgb A1C (Glycohemoglobin) (83036-A1C) TLB-IBC Pnl (Iron/FE;Transferrin) (83550-IBC) TLB-CBC Platelet - w/Differential (85025-CBCD)  Hgb: 10.7 (08/05/2009)   Hct: 34.8 (08/05/2009)   Platelets: 291.0 (08/05/2009) RBC: 4.71 (08/05/2009)   RDW: 16.8 (08/05/2009)   WBC: 5.5 (08/05/2009) MCV: 73.9 (08/05/2009)   MCHC: 30.6 L g/dL (16/04/9603) Iron: 27 (08/05/2009)   % Sat: 6.6 (08/05/2009) B12: 327 (08/05/2009)    Folate: 6.8 (08/05/2009)   TSH: 1.94 (08/05/2009)  Complete Medication List: 1)  Glipizide 10 Mg Tabs (Glipizide) .... Take 1 tablet by mouth two times a day 2)  Catapres 0.1 Mg Tabs (Clonidine hcl) .... One by mouth three times a day 3)  Metformin Hcl 1000 Mg Tabs (Metformin hcl) .... Take 1 tablet by mouth two times a day 4)  Proair Hfa 108 (90 Base) Mcg/act Aers (Albuterol sulfate) 5)  Cimetidine 200 Mg Tabs (Cimetidine) .... One by mouth bid 6)  Zyrtec Allergy 10 Mg Caps (Cetirizine hcl) .... One by mouth once daily 7)  Freestyle Lite Test Strp (Glucose blood) .... Test up to 2 times daily dx 250.00 8)  Freestyle Lancets Misc (Lancets) .... Test up to two times a day as needed dx 250.00 9)  Benicar 20 Mg Tabs (Olmesartan medoxomil) .... One by mouth once daily for high blood pressure  Hypertension Assessment/Plan:      The patient's hypertensive risk group is category C: Target organ damage and/or diabetes.  Her calculated 10 year risk of coronary heart disease is 9 %.  Today's blood pressure is 134/80.  Her blood pressure goal is < 130/80.  Patient Instructions: 1)  Please schedule a follow-up appointment in 2 months. 2)  It is important that you exercise regularly at least 20 minutes 5 times a week. If you develop chest pain, have severe difficulty breathing, or feel very tired , stop exercising immediately and seek medical attention. 3)  You need to lose weight. Consider a lower calorie diet and regular exercise.  4)  Check your blood sugars regularly. If your readings are usually above 200  or below 70 you should contact our office. 5)  It is important that your Diabetic A1c level is checked every 3 months. 6)  See your eye doctor yearly to check for diabetic eye damage. 7)  Check your feet each night for sore areas, calluses or signs of infection. 8)  Check your Blood Pressure regularly. If it is above 130/80: you should make an appointment. Prescriptions: BENICAR 20 MG TABS  (OLMESARTAN MEDOXOMIL) One by mouth once daily for high blood pressure  #105 x 0   Entered and Authorized by:   Etta Grandchild MD   Signed by:   Etta Grandchild MD on 10/30/2009   Method used:   Samples Given   RxID:   [id number]   Preventive Care Screening  Last Flu Shot:    Date:  04/26/2009    Results:  given   Pap Smear:  Date:  03/27/2009    Results:  normal

## 2010-08-26 NOTE — Progress Notes (Signed)
Summary: PA Test Strips  Phone Note Call from Patient   Caller: [facility]  Call For: ID:  [id number]    Case: [id number] Summary of Call: PA request--Freestyle Lite Test Strips. Ok to proceeed? Initial call taken by: Lucious Groves,  August 27, 2009 8:46 AM  Follow-up for Phone Call        yes Follow-up by: Etta Grandchild MD,  August 27, 2009 8:48 AM  Additional Follow-up for Phone Call Additional follow up Details #1::        Per [facility] this is a qty issue, they will fax forms. Additional Follow-up by: Lucious Groves,  August 27, 2009 2:18 PM    Additional Follow-up for Phone Call Additional follow up Details #2::    forms were completed and faxed.Lucious Groves,  August 28, 2009 1:32 PM  Called automated system and prescription is denied. Non insulin users can only receive 51 for 31/34 day supply and 153 per 90 day supply. Please advise. Lucious Groves  August 29, 2009 11:24 AM

## 2010-08-26 NOTE — Letter (Signed)
Summary: Request for modification of FMLA/Pt's Supervisor  Request for modification of FMLA/Pt's Supervisor   Imported By: Sherian Rein 01/03/2010 08:35:03  _____________________________________________________________________  External Attachment:    Type:   Image     Comment:   External Document

## 2010-08-26 NOTE — Assessment & Plan Note (Signed)
Summary: NEW PT CPX/ PT TO COME FASTING/ UHC/ NWS #   Vital Signs:  Patient profile:   50 year old female Height:      61 inches Weight:      277 pounds BMI:     52.53 O2 Sat:      98 % on Room air Temp:     98.6 degrees F oral Pulse rate:   60 / minute Pulse rhythm:   regular Resp:     16 per minute BP sitting:   144 / 94  (left arm) Cuff size:   large  Vitals Entered By: Rock Nephew CMA (August 05, 2009 10:55 AM)  Nutrition Counseling: Patient's BMI is greater than 25 and therefore counseled on weight management options.  O2 Flow:  Room air  Primary Care Provider:  Etta Grandchild MD   History of Present Illness: New to me she wants to establish primary care.  Asthma History    Initial Asthma Severity Rating:    Age range: 12+ years    Symptoms: 0-2 days/week    Nighttime Awakenings: 0-2/month    Interferes w/ normal activity: no limitations    SABA use (not for EIB): 0-2 days/week    Asthma Severity Assessment: Intermittent  Hypertension History:      She denies headache, chest pain, palpitations, dyspnea with exertion, orthopnea, PND, peripheral edema, visual symptoms, neurologic problems, syncope, and side effects from treatment.  She notes no problems with any antihypertensive medication side effects.        Positive major cardiovascular risk factors include diabetes and hypertension.  Negative major cardiovascular risk factors include female age less than 41 years old, no history of hyperlipidemia, negative family history for ischemic heart disease, and non-tobacco-user status.        Further assessment for target organ damage reveals no history of ASHD, cardiac end-organ damage (CHF/LVH), stroke/TIA, peripheral vascular disease, renal insufficiency, or hypertensive retinopathy.      Preventive Screening-Counseling & Management  Alcohol-Tobacco     Alcohol drinks/day: <1     Alcohol type: wine     >5/day in last 3 mos: no     Alcohol Counseling: not  indicated; use of alcohol is not excessive or problematic     Feels need to cut down: no     Feels annoyed by complaints: no     Feels guilty re: drinking: no     Needs 'eye opener' in am: no     Smoking Status: quit > 6 months     Year Quit: 1980  Caffeine-Diet-Exercise     Does Patient Exercise: yes  Hep-HIV-STD-Contraception     Hepatitis Risk: no risk noted     HIV Risk: no risk noted     STD Risk: no risk noted     SBE monthly: yes     SBE Education/Counseling: to perform regular SBE      Sexual History:  currently monogamous.        Drug Use:  no.        Blood Transfusions:  no.    Current Medications (verified): 1)  Amlodipine Besylate 5 Mg Tabs (Amlodipine Besylate) .... .qdtab 2)  Glipizide 10 Mg Tabs (Glipizide) .... Take 1 Tablet By Mouth Two Times A Day 3)  Clonidine 0.1 4)  Metformin Hcl 1000 Mg Tabs (Metformin Hcl) .... Take 1 Tablet By Mouth Two Times A Day 5)  Proair Hfa 108 (90 Base) Mcg/act Aers (Albuterol Sulfate) 6)  Cimetidine 200 Mg  Tabs (Cimetidine) .... One By Mouth Bid 7)  Zyrtec Allergy 10 Mg Caps (Cetirizine Hcl) .... One By Mouth Once Daily  Allergies (verified): No Known Drug Allergies  Past History:  Past Medical History: Allergic rhinitis Anemia-NOS Asthma Diabetes mellitus, type II GERD Hypertension Osteoarthritis  Past Surgical History: Tubal ligation  Family History: Family History of Colon CA 1st degree relative <60 Family History Diabetes 1st degree relative Family History Hypertension Family History of Prostate CA 1st degree relative <50 Family History of Stroke F 1st degree relative <60  Social History: Occupation: CSR at [employer] Married Alcohol use-no Drug use-no Regular exercise-yes Smoking Status:  quit > 6 months Hepatitis Risk:  no risk noted HIV Risk:  no risk noted STD Risk:  no risk noted Sexual History:  currently monogamous Blood Transfusions:  no Drug Use:  no Does Patient Exercise:  yes  Review of  Systems       The patient complains of weight gain.  The patient denies abdominal pain, melena, hematochezia, and severe indigestion/heartburn.   Endo:  Denies cold intolerance, excessive hunger, excessive thirst, excessive urination, polyuria, and weight change. Heme:  Denies abnormal bruising, bleeding, enlarge lymph nodes, fevers, pallor, and skin discoloration.  Physical Exam  General:  alert, well-developed, well-nourished, well-hydrated, appropriate dress, cooperative to examination, good hygiene, and overweight-appearing.   Head:  normocephalic, atraumatic, no abnormalities observed, and no abnormalities palpated.   Eyes:  No corneal or conjunctival inflammation noted. EOMI. Perrla. Funduscopic exam benign, without hemorrhages, exudates or papilledema. Vision grossly normal. Mouth:  Oral mucosa and oropharynx without lesions or exudates.  Teeth in good repair. Neck:  supple, full ROM, no masses, no thyromegaly, no JVD, normal carotid upstroke, and no carotid bruits.   Lungs:  normal respiratory effort, no intercostal retractions, no accessory muscle use, normal breath sounds, no dullness, no fremitus, no crackles, and no wheezes.   Heart:  normal rate, regular rhythm, no murmur, no gallop, no rub, and no JVD.   Abdomen:  soft, non-tender, normal bowel sounds, no distention, no masses, no guarding, no rigidity, no hepatomegaly, and no splenomegaly.   Msk:  No deformity or scoliosis noted of thoracic or lumbar spine.    Diabetes Management Exam:    Foot Exam (with socks and/or shoes not present):       Sensory-Pinprick/Light touch:          Left medial foot (L-4): normal          Left dorsal foot (L-5): normal          Left lateral foot (S-1): normal          Right medial foot (L-4): normal          Right dorsal foot (L-5): normal          Right lateral foot (S-1): normal       Sensory-Monofilament:          Left foot: normal          Right foot: normal       Inspection:           Left foot: normal          Right foot: normal       Nails:          Left foot: normal          Right foot: normal    Eye Exam:       Eye Exam done elsewhere  Date: 04/12/2009          Results: normal          Done by: Advanced   Impression & Recommendations:  Problem # 1:  HYPERTENSION (ICD-401.9) Assessment Improved  Her updated medication list for this problem includes:    Amlodipine Besylate 5 Mg Tabs (Amlodipine besylate) ..... Once daily    Catapres 0.1 Mg Tabs (Clonidine hcl) ..... One by mouth three times a day  Orders: Venipuncture (04540) TLB-Lipid Panel (80061-LIPID) TLB-BMP (Basic Metabolic Panel-BMET) (80048-METABOL) TLB-CBC Platelet - w/Differential (85025-CBCD) TLB-Hepatic/Liver Function Pnl (80076-HEPATIC) TLB-TSH (Thyroid Stimulating Hormone) (84443-TSH) TLB-B12 + Folate Pnl (98119_14782-N56/OZH) TLB-IBC Pnl (Iron/FE;Transferrin) (83550-IBC) TLB-A1C / Hgb A1C (Glycohemoglobin) (83036-A1C) TLB-Microalbumin/Creat Ratio, Urine (82043-MALB) TLB-Udip w/ Micro (81001-URINE)  BP today: 144/94  10 Yr Risk Heart Disease: Not enough information  Problem # 2:  DIABETES MELLITUS, TYPE II (ICD-250.00) Assessment: Unchanged  Her updated medication list for this problem includes:    Glipizide 10 Mg Tabs (Glipizide) .[name]... Take 1 tablet by mouth two times a day    Metformin Hcl 1000 Mg Tabs (Metformin hcl) .[name]... Take 1 tablet by mouth two times a day  Orders: Venipuncture (08657) TLB-Lipid Panel (80061-LIPID) TLB-BMP (Basic Metabolic Panel-BMET) (80048-METABOL) TLB-CBC Platelet - w/Differential (85025-CBCD) TLB-Hepatic/Liver Function Pnl (80076-HEPATIC) TLB-TSH (Thyroid Stimulating Hormone) (84443-TSH) TLB-B12 + Folate Pnl (84696_29528-U13/KGM) TLB-IBC Pnl (Iron/FE;Transferrin) (83550-IBC) TLB-A1C / Hgb A1C (Glycohemoglobin) (83036-A1C) TLB-Microalbumin/Creat Ratio, Urine (82043-MALB) TLB-Udip w/ Micro (81001-URINE)  Problem # 3:  GERD  (ICD-530.81) Assessment: Improved  Her updated medication list for this problem includes:    Cimetidine 200 Mg Tabs (Cimetidine) ..... One by mouth bid  Orders: Venipuncture (01027) TLB-Lipid Panel (80061-LIPID) TLB-BMP (Basic Metabolic Panel-BMET) (80048-METABOL) TLB-CBC Platelet - w/Differential (85025-CBCD) TLB-Hepatic/Liver Function Pnl (80076-HEPATIC) TLB-TSH (Thyroid Stimulating Hormone) (84443-TSH) TLB-B12 + Folate Pnl (25366_44034-V42/VZD) TLB-IBC Pnl (Iron/FE;Transferrin) (83550-IBC) TLB-A1C / Hgb A1C (Glycohemoglobin) (83036-A1C) TLB-Microalbumin/Creat Ratio, Urine (82043-MALB) TLB-Udip w/ Micro (81001-URINE)  Problem # 4:  ASTHMA (ICD-493.90) Assessment: Improved  Her updated medication list for this problem includes:    Proair Hfa 108 (90 Base) Mcg/act Aers (Albuterol sulfate)  Problem # 5:  ANEMIA-NOS (ICD-285.9) Assessment: Unchanged  Orders: Venipuncture (63875) TLB-Lipid Panel (80061-LIPID) TLB-BMP (Basic Metabolic Panel-BMET) (80048-METABOL) TLB-CBC Platelet - w/Differential (85025-CBCD) TLB-Hepatic/Liver Function Pnl (80076-HEPATIC) TLB-TSH (Thyroid Stimulating Hormone) (84443-TSH) TLB-B12 + Folate Pnl (64332_95188-C16/SAY) TLB-IBC Pnl (Iron/FE;Transferrin) (83550-IBC) TLB-A1C / Hgb A1C (Glycohemoglobin) (83036-A1C) TLB-Microalbumin/Creat Ratio, Urine (82043-MALB) TLB-Udip w/ Micro (81001-URINE)  Complete Medication List: 1)  Amlodipine Besylate 5 Mg Tabs (Amlodipine besylate) .... Once daily 2)  Glipizide 10 Mg Tabs (Glipizide) .... Take 1 tablet by mouth two times a day 3)  Catapres 0.1 Mg Tabs (Clonidine hcl) .... One by mouth three times a day 4)  Metformin Hcl 1000 Mg Tabs (Metformin hcl) .... Take 1 tablet by mouth two times a day 5)  Proair Hfa 108 (90 Base) Mcg/act Aers (Albuterol sulfate) 6)  Cimetidine 200 Mg Tabs (Cimetidine) .... One by mouth bid 7)  Zyrtec Allergy 10 Mg Caps (Cetirizine hcl) .... One by mouth once daily  Other  Orders: Gastroenterology Referral (GI)  Hypertension Assessment/Plan:      The patient's hypertensive risk group is category C: Target organ damage and/or diabetes.  Today's blood pressure is 144/94.  Her blood pressure goal is < 130/80.  Colorectal Screening:  Colon Cancer risk factors:    Father w/colon CA < age 76  Colonoscopy Results:    Date of Exam: 08/05/1988    Results: Normal  PAP Screening:  Hx Cervical Dysplasia in last 5 yrs? No    3 normal PAP smears in last 5 yrs? Yes    Last PAP smear:  09/11/2008  PAP Smear Results:    Date of Exam:  09/11/2008    Results:  Normal  Mammogram Screening:    Last Mammogram:  04/10/2009  Mammogram Results:    Date of Exam:  04/10/2009    Results:  Normal Bilateral  Osteoporosis Risk Assessment:  Risk Factors for Fracture or Low Bone Density:   Smoking status:       quit > 6 months  Immunization & Chemoprophylaxis:    Tetanus vaccine: Td  (07/30/1998)    Pneumovax: Pneumovax  (05/16/2009)  Patient Instructions: 1)  Please schedule a follow-up appointment in 2 months. 2)  It is important that you exercise regularly at least 20 minutes 5 times a week. If you develop chest pain, have severe difficulty breathing, or feel very tired , stop exercising immediately and seek medical attention. 3)  You need to lose weight. Consider a lower calorie diet and regular exercise.  4)  Check your blood sugars regularly. If your readings are usually above  200 or below 70 you should contact our office. 5)  It is important that your Diabetic A1c level is checked every 3 months. 6)  See your eye doctor yearly to check for diabetic eye damage. 7)  Check your feet each night for sore areas, calluses or signs of infection. 8)  Check your Blood Pressure regularly. If it is above 140/90: you should make an appointment. Prescriptions: CATAPRES 0.1 MG TABS (CLONIDINE HCL) One by mouth three times a day  #90 x 11   Entered and Authorized by:    Etta Grandchild MD   Signed by:   Etta Grandchild MD on 08/05/2009   Method used:   Electronically to        [facility] [phone]* (retail)       [address]       [place], [place]  [address]       Ph: [phone]       Fax: [phone]   RxID:   [id number] METFORMIN HCL 1000 MG TABS (METFORMIN HCL) Take 1 tablet by mouth two times a day  #60 x 11   Entered and Authorized by:   Etta Grandchild MD   Signed by:   Etta Grandchild MD on 08/05/2009   Method used:   Electronically to        [facility] [phone]* (retail)       [address]       [place], [place]  [address]       Ph: [phone]       Fax: [phone]   RxID:   [id number] GLIPIZIDE 10 MG TABS (GLIPIZIDE) Take 1 tablet by mouth two times a day  #60 x 11   Entered and Authorized by:   Etta Grandchild MD   Signed by:   Etta Grandchild MD on 08/05/2009   Method used:   Electronically to        [facility] [phone]* (retail)       [address]       [place], [place]  [address]       Ph: [phone]       Fax: [phone]   RxID:   [id number] AMLODIPINE BESYLATE 5 MG TABS (AMLODIPINE BESYLATE) once daily  #30 x 11  Entered and Authorized by:   Etta Grandchild MD   Signed by:   Etta Grandchild MD on 08/05/2009   Method used:   Electronically to        [facility] [phone]* (retail)       [address]       [place], [place]  [address]       Ph: [phone]       Fax: [phone]   RxID:   [id number] ZYRTEC ALLERGY 10 MG CAPS (CETIRIZINE HCL) One by mouth once daily  #30 x 11   Entered and Authorized by:   Etta Grandchild MD   Signed by:   Etta Grandchild MD on 08/05/2009   Method used:   Print then Give to Patient   RxID:   [id number] CIMETIDINE 200 MG TABS (CIMETIDINE) One by mouth BID  #60 x 11   Entered and Authorized by:   Etta Grandchild MD   Signed by:   Etta Grandchild MD on 08/05/2009   Method used:   Print then Give to Patient   RxID:    [id number]      Tetanus/Td Immunization History:    Tetanus/Td # 1:  Td (07/30/1998)  Pneumovax Immunization History:    Pneumovax # 1:  Pneumovax (05/16/2009)   Appended Document: NEW PT CPX/ PT TO COME FASTING/ UHC/ NWS #

## 2010-08-26 NOTE — Letter (Signed)
Summary: Lipid Letter  [facility]  [address] [place], [place] [address]   Phone: [phone]  Fax: [phone]    10/30/2009  [facility] [address] [place], [place]  [address]  Dear Lori Mclaughlin:  We have carefully reviewed your last lipid profile from 10/30/2009 and the results are noted below with a summary of recommendations for lipid management.    Cholesterol:       139     Goal: <200   HDL "good" Cholesterol:   28.41     Goal: >40   LDL "bad" Cholesterol:   73     Goal: <100   Triglycerides:       53.0     Goal: <150    you are anemic and your iron level is low. your blood sugars are just a little too high.    TLC Diet (Therapeutic Lifestyle Change): Saturated Fats & Transfatty acids should be kept < 7% of total calories ***Reduce Saturated Fats Polyunstaurated Fat can be up to 10% of total calories Monounsaturated Fat Fat can be up to 20% of total calories Total Fat should be no greater than 25-35% of total calories Carbohydrates should be 50-60% of total calories Protein should be approximately 15% of total calories Fiber should be at least 20-30 grams a day ***Increased fiber may help lower LDL Total Cholesterol should be < 200mg /day Consider adding plant stanol/sterols to diet (example: Benacol spread) ***A higher intake of unsaturated fat may reduce Triglycerides and Increase HDL    Adjunctive Measures (may lower LIPIDS and reduce risk of Heart Attack) include: Aerobic Exercise (20-30 minutes 3-4 times a week) Limit Alcohol Consumption Weight Reduction Aspirin 75-81 mg a day by mouth (if not allergic or contraindicated) Dietary Fiber 20-30 grams a day by mouth     Current Medications: 1)    Glipizide 10 Mg Tabs (Glipizide) .... Take 1 tablet by mouth two times a day 2)    Catapres 0.1 Mg Tabs (Clonidine hcl) .... One by mouth three times a day 3)    Metformin Hcl 1000 Mg Tabs (Metformin hcl) .... Take 1 tablet by mouth two times a  day 4)    Proair Hfa 108 (90 Base) Mcg/act Aers (Albuterol sulfate) 5)    Cimetidine 200 Mg Tabs (Cimetidine) .... One by mouth bid 6)    Zyrtec Allergy 10 Mg Caps (Cetirizine hcl) .... One by mouth once daily 7)    Freestyle Lite Test  Strp (Glucose blood) .... Test up to 2 times daily dx 250.00 8)    Freestyle Lancets  Misc (Lancets) .... Test up to two times a day as needed dx 250.00 9)    Benicar 20 Mg Tabs (Olmesartan medoxomil) .... One by mouth once daily for high blood pressure  If you have any questions, please call. We appreciate being able to work with you.   Sincerely,    [facility] Etta Grandchild MD

## 2010-08-26 NOTE — Letter (Signed)
Summary: [facility] Instructions  [facility]  [address] [place], [place] [address]   Phone: [phone]  Fax: [phone]       Lori Mclaughlin    04/15/1961    MRN: [id number]        Procedure Day Dorna Bloom:  Wednesday 08/28/2009     Arrival Time:  7:30 am      Procedure Time: 8:30 am     Location of Procedure:                    _x _  [facility] (4th Floor)                        PREPARATION FOR COLONOSCOPY WITH MOVIPREP   Starting 5 days prior to your procedure Friday 1/28 do not eat nuts, seeds, popcorn, corn, beans, peas,  salads, or any raw vegetables.  Do not take any fiber supplements (e.g. Metamucil, Citrucel, and Benefiber).  THE DAY BEFORE YOUR PROCEDURE         DATE: Tuesday 2/1  1.  Drink clear liquids the entire day-NO SOLID FOOD  2.  Do not drink anything colored red or purple.  Avoid juices with pulp.  No orange juice.  3.  Drink at least 64 oz. (8 glasses) of fluid/clear liquids during the day to prevent dehydration and help the prep work efficiently.  CLEAR LIQUIDS INCLUDE: Water Jello Ice Popsicles Tea (sugar ok, no milk/cream) Powdered fruit flavored drinks Coffee (sugar ok, no milk/cream) Gatorade Juice: apple, white grape, white cranberry  Lemonade Clear bullion, consomm, broth Carbonated beverages (any kind) Strained chicken noodle soup Hard Candy                             4.  In the morning, mix first dose of MoviPrep solution:    Empty 1 Pouch A and 1 Pouch B into the disposable container    Add lukewarm drinking water to the top line of the container. Mix to dissolve    Refrigerate (mixed solution should be used within 24 hrs)  5.  Begin drinking the prep at 5:00 p.m. The MoviPrep container is divided by 4 marks.   Every 15 minutes drink the solution down to the next mark (approximately 8 oz) until the full liter is complete.   6.  Follow completed prep with 16 oz of clear liquid of your choice  (Nothing red or purple).  Continue to drink clear liquids until bedtime.  7.  Before going to bed, mix second dose of MoviPrep solution:    Empty 1 Pouch A and 1 Pouch B into the disposable container    Add lukewarm drinking water to the top line of the container. Mix to dissolve    Refrigerate  THE DAY OF YOUR PROCEDURE      DATE: Wednesday 2/2  Beginning at 3:30 a.m. (5 hours before procedure):         1. Every 15 minutes, drink the solution down to the next mark (approx 8 oz) until the full liter is complete.  2. Follow completed prep with 16 oz. of clear liquid of your choice.    3. You may drink clear liquids until 6:30 am (2 HOURS BEFORE PROCEDURE).   MEDICATION INSTRUCTIONS  Unless otherwise instructed, you should take regular prescription medications with a small sip of water   as early as possible the morning  of your procedure.  Diabetic patients - see separate instructions.          OTHER INSTRUCTIONS  You will need a responsible adult at least 52 years of age to accompany you and drive you home.   This person must remain in the waiting room during your procedure.  Wear loose fitting clothing that is easily removed.  Leave jewelry and other valuables at home.  However, you may wish to bring a book to read or  an iPod/MP3 player to listen to music as you wait for your procedure to start.  Remove all body piercing jewelry and leave at home.  Total time from sign-in until discharge is approximately 2-3 hours.  You should go home directly after your procedure and rest.  You can resume normal activities the  day after your procedure.  The day of your procedure you should not:   Drive   Make legal decisions   Operate machinery   Drink alcohol   Return to work  You will receive specific instructions about eating, activities and medications before you leave.    The above instructions have been reviewed and explained to me by    _______________________    I fully understand and can verbalize these instructions _____________________________ Date _________

## 2010-08-26 NOTE — Letter (Signed)
Summary: Results Follow-up Letter  [facility]  [address] [place], [place] [address]   Phone: [phone]  Fax: [phone]    08/05/2009  [address] [place], [place]  [address]  Dear [name],   The following are the results of your recent test(s):  Test     Result     Blood sugar     199, too high A1c       11.8%, too high CBC       mild anemia Iron       low B12       normal Liver/kidney   normal Thyroid     normal    _________________________________________________________  Please call for an appointment in 1-2 months _________________________________________________________ _________________________________________________________ _________________________________________________________  Sincerely,  Sanda Linger MD [facility]

## 2010-08-29 ENCOUNTER — Other Ambulatory Visit: Payer: 59

## 2010-08-29 ENCOUNTER — Encounter: Payer: Self-pay | Admitting: Internal Medicine

## 2010-08-29 ENCOUNTER — Other Ambulatory Visit: Payer: Self-pay | Admitting: Internal Medicine

## 2010-08-29 ENCOUNTER — Ambulatory Visit (INDEPENDENT_AMBULATORY_CARE_PROVIDER_SITE_OTHER): Payer: 59 | Admitting: Internal Medicine

## 2010-08-29 ENCOUNTER — Telehealth: Payer: Self-pay | Admitting: Internal Medicine

## 2010-08-29 DIAGNOSIS — E119 Type 2 diabetes mellitus without complications: Secondary | ICD-10-CM

## 2010-08-29 DIAGNOSIS — B351 Tinea unguium: Secondary | ICD-10-CM

## 2010-08-29 DIAGNOSIS — Z1231 Encounter for screening mammogram for malignant neoplasm of breast: Secondary | ICD-10-CM

## 2010-08-29 DIAGNOSIS — L851 Acquired keratosis [keratoderma] palmaris et plantaris: Secondary | ICD-10-CM

## 2010-08-29 DIAGNOSIS — I10 Essential (primary) hypertension: Secondary | ICD-10-CM

## 2010-08-29 DIAGNOSIS — K219 Gastro-esophageal reflux disease without esophagitis: Secondary | ICD-10-CM

## 2010-08-29 DIAGNOSIS — D649 Anemia, unspecified: Secondary | ICD-10-CM

## 2010-08-29 LAB — CBC WITH DIFFERENTIAL/PLATELET
Basophils Absolute: 0 10*3/uL (ref 0.0–0.1)
Basophils Relative: 0.6 % (ref 0.0–3.0)
Eosinophils Absolute: 0.2 10*3/uL (ref 0.0–0.7)
Eosinophils Relative: 3.4 % (ref 0.0–5.0)
HCT: 33.8 % — ABNORMAL LOW (ref 36.0–46.0)
Hemoglobin: 10.9 g/dL — ABNORMAL LOW (ref 12.0–15.0)
Lymphocytes Relative: 23.1 % (ref 12.0–46.0)
Lymphs Abs: 1.3 10*3/uL (ref 0.7–4.0)
MCHC: 32.3 g/dL (ref 30.0–36.0)
MCV: 71.9 fl — ABNORMAL LOW (ref 78.0–100.0)
Monocytes Absolute: 0.6 10*3/uL (ref 0.1–1.0)
Monocytes Relative: 10 % (ref 3.0–12.0)
Neutro Abs: 3.6 10*3/uL (ref 1.4–7.7)
Neutrophils Relative %: 62.9 % (ref 43.0–77.0)
Platelets: 274 10*3/uL (ref 150.0–400.0)
RBC: 4.69 Mil/uL (ref 3.87–5.11)
RDW: 20.2 % — ABNORMAL HIGH (ref 11.5–14.6)
WBC: 5.7 10*3/uL (ref 4.5–10.5)

## 2010-08-29 LAB — BASIC METABOLIC PANEL
BUN: 14 mg/dL (ref 6–23)
CO2: 27 mEq/L (ref 19–32)
Calcium: 9 mg/dL (ref 8.4–10.5)
Chloride: 104 mEq/L (ref 96–112)
Creatinine, Ser: 0.8 mg/dL (ref 0.4–1.2)
GFR: 76.92 mL/min (ref >60.00)
Glucose, Bld: 221 mg/dL — ABNORMAL HIGH (ref 70–99)
Potassium: 4.8 mEq/L (ref 3.5–5.1)
Sodium: 137 mEq/L (ref 135–145)

## 2010-08-29 LAB — URINALYSIS, ROUTINE W REFLEX MICROSCOPIC
Bilirubin Urine: NEGATIVE
Hgb urine dipstick: NEGATIVE
Ketones, ur: NEGATIVE
Nitrite: NEGATIVE
Specific Gravity, Urine: >=1.03 (ref 1.000–1.030)
Total Protein, Urine: NEGATIVE
Urine Glucose: NEGATIVE
Urobilinogen, UA: 0.2 (ref 0.0–1.0)
pH: 5.5 (ref 5.0–8.0)

## 2010-08-29 LAB — HEPATIC FUNCTION PANEL
ALT: 12 U/L (ref 0–35)
AST: 13 U/L (ref 0–37)
Albumin: 3.3 g/dL — ABNORMAL LOW (ref 3.5–5.2)
Alkaline Phosphatase: 80 U/L (ref 39–117)
Bilirubin, Direct: 0 mg/dL (ref 0.0–0.3)
Total Bilirubin: 0.4 mg/dL (ref 0.3–1.2)
Total Protein: 7.1 g/dL (ref 6.0–8.3)

## 2010-08-29 LAB — LIPID PANEL
Cholesterol: 147 mg/dL (ref 0–200)
HDL: 50 mg/dL (ref >39.00)
LDL Cholesterol: 86 mg/dL (ref 0–99)
Total CHOL/HDL Ratio: 3
Triglycerides: 56 mg/dL (ref 0.0–149.0)
VLDL: 11.2 mg/dL (ref 0.0–40.0)

## 2010-08-29 LAB — TSH: TSH: 1.5 u[IU]/mL (ref 0.35–5.50)

## 2010-08-29 LAB — HEMOGLOBIN A1C: Hgb A1c MFr Bld: 11.3 % — ABNORMAL HIGH (ref 4.6–6.5)

## 2010-08-29 LAB — HM DIABETES FOOT EXAM

## 2010-09-03 NOTE — Letter (Signed)
Summary: Results Follow-up Letter  [facility]  [address] [place], [place] [address]   Phone: [phone]  Fax: [phone]    08/29/2010  [address] [place], [place]  [address]  [place]  Dear [name],   The following are the results of your recent test(s):  Test     Result     CBC       mild anemia Blood sugars   way too high Thyroid     normal Liver/kidney   normal Urine       normal  _________________________________________________________  Please call for an appointment soon _________________________________________________________ _________________________________________________________ _________________________________________________________  Sincerely,  Sanda Linger MD [facility]

## 2010-09-03 NOTE — Letter (Signed)
Summary: Lipid Letter  [facility]  [address] [place], [place] [address]   Phone: [phone]  Fax: [phone]    08/29/2010  [facility] [address] [place], [place]  [address]  Dear Babette Relic:  We have carefully reviewed your last lipid profile from 08/29/2010 and the results are noted below with a summary of recommendations for lipid management.    Cholesterol:       147     Goal: <200   HDL "good" Cholesterol:   57.84     Goal: >50   LDL "bad" Cholesterol:   86     Goal: <100   Triglycerides:       56.0     Goal: <150        TLC Diet (Therapeutic Lifestyle Change): Saturated Fats & Transfatty acids should be kept < 7% of total calories ***Reduce Saturated Fats Polyunstaurated Fat can be up to 10% of total calories Monounsaturated Fat Fat can be up to 20% of total calories Total Fat should be no greater than 25-35% of total calories Carbohydrates should be 50-60% of total calories Protein should be approximately 15% of total calories Fiber should be at least 20-30 grams a day ***Increased fiber may help lower LDL Total Cholesterol should be < 200mg /day Consider adding plant stanol/sterols to diet (example: Benacol spread) ***A higher intake of unsaturated fat may reduce Triglycerides and Increase HDL    Adjunctive Measures (may lower LIPIDS and reduce risk of Heart Attack) include: Aerobic Exercise (20-30 minutes 3-4 times a week) Limit Alcohol Consumption Weight Reduction Aspirin 75-81 mg a day by mouth (if not allergic or contraindicated) Dietary Fiber 20-30 grams a day by mouth     Current Medications: 1)    Freestyle Lancets  Misc (Lancets) .... Test up to two times a day as needed dx 250.00 2)    Benicar 20 Mg Tabs (Olmesartan medoxomil) .... One by mouth once daily for high blood pressure 3)    Bee Pollen  4)    Janumet 50-1000 Mg Tabs (Sitagliptin-metformin hcl) .... One by mouth two times a day for diabetes 5)    Terbinafine Hcl 250  Mg Tabs (Terbinafine hcl) .... One by mouth once daily for toenail fungus 6)    Lac-hydrin Five 5 % Lotn (Ammonium lactate) .... Apply to dry skin ad prn  If you have any questions, please call. We appreciate being able to work with you.   Sincerely,    [facility] Etta Grandchild MD

## 2010-09-03 NOTE — Assessment & Plan Note (Signed)
Summary: PER PT FRI APPT  STC   Vital Signs:  Patient profile:   50 year old female Menstrual status:  postmenopausal Height:      61 inches Weight:      280 pounds O2 Sat:      97 % on Room air Temp:     98.4 degrees F oral Pulse rate:   66 / minute Pulse rhythm:   regular Resp:     16 per minute BP sitting:   130 / 80  (left arm) Cuff size:   large  Vitals Entered By: Rock Nephew CMA (August 29, 2010 10:15 AM)  O2 Flow:  Room air CC: follow-up visit, Hypertension Management Is Patient Diabetic? Yes Did you bring your meter with you today? No Pain Assessment Patient in pain? no          Menstrual Status postmenopausal Last PAP Result normal   Primary Care Provider:  Etta Grandchild MD  CC:  follow-up visit and Hypertension Management.  History of Present Illness:  Follow-Up Visit      This is a 50 year old woman who presents for Follow-up visit.  The patient complains of high blood sugar symptoms, but denies chest pain, palpitations, dizziness, syncope, low blood sugar symptoms, edema, SOB, DOE, PND, and orthopnea.  Since the last visit the patient notes no new problems or concerns.  The patient reports taking meds as prescribed, monitoring BP, monitoring blood sugars, and dietary noncompliance.  When questioned about possible medication side effects, the patient notes none.  She also complains of dry skin and tells me that her toenails have become think, dark, and painful.    Hypertension History:      She denies headache, chest pain, palpitations, dyspnea with exertion, orthopnea, PND, peripheral edema, visual symptoms, neurologic problems, syncope, and side effects from treatment.  She notes no problems with any antihypertensive medication side effects.        Positive major cardiovascular risk factors include diabetes and hypertension.  Negative major cardiovascular risk factors include female age less than 68 years old, no history of hyperlipidemia, negative  family history for ischemic heart disease, and non-tobacco-user status.        Further assessment for target organ damage reveals no history of ASHD, cardiac end-organ damage (CHF/LVH), stroke/TIA, peripheral vascular disease, renal insufficiency, or hypertensive retinopathy.     Preventive Screening-Counseling & Management  Alcohol-Tobacco     Alcohol drinks/day: <1     Alcohol type: wine     >5/day in last 3 mos: no     Alcohol Counseling: not indicated; use of alcohol is not excessive or problematic     Feels need to cut down: no     Feels annoyed by complaints: no     Feels guilty re: drinking: no     Needs 'eye opener' in am: no     Smoking Status: quit > 6 months     Year Quit: 1980     Tobacco Counseling: to remain off tobacco products  Hep-HIV-STD-Contraception     Hepatitis Risk: no risk noted     HIV Risk: no risk noted     STD Risk: no risk noted     SBE monthly: yes     SBE Education/Counseling: to perform regular SBE      Sexual History:  currently monogamous.        Drug Use:  no.        Blood Transfusions:  no.  Clinical Review Panels:  Prevention   Last Mammogram:  Normal Bilateral (04/10/2009)   Last Pap Smear:  normal (03/27/2009)   Last Colonoscopy:  Normal (08/05/1988)  Immunizations   Last Tetanus Booster:  Td (07/30/1998)   Last Flu Vaccine:  given (04/26/2009)   Last Pneumovax:  Pneumovax (05/16/2009)  Lipid Management   Cholesterol:  139 (10/30/2009)   LDL (bad choesterol):  73 (10/30/2009)   HDL (good cholesterol):  55.60 (10/30/2009)  Diabetes Management   HgBA1C:  7.6 (01/01/2010)   Creatinine:  0.9 (01/01/2010)   Last Dilated Eye Exam:  normal (04/12/2009)   Last Foot Exam:  yes (08/29/2010)   Last Flu Vaccine:  given (04/26/2009)   Last Pneumovax:  Pneumovax (05/16/2009)  CBC   WBC:  6.2 (01/01/2010)   RBC:  4.81 (01/01/2010)   Hgb:  10.6 (01/01/2010)   Hct:  33.2 (01/01/2010)   Platelets:  285.0 (01/01/2010)   MCV  68.9  (01/01/2010)   MCHC  32.1 (01/01/2010)   RDW  18.6 (01/01/2010)   PMN:  67.0 (01/01/2010)   Lymphs:  22.8 (01/01/2010)   Monos:  6.5 (01/01/2010)   Eosinophils:  3.3 (01/01/2010)   Basophil:  0.4 (01/01/2010)  Complete Metabolic Panel   Glucose:  81 (01/01/2010)   Sodium:  141 (01/01/2010)   Potassium:  4.4 (01/01/2010)   Chloride:  106 (01/01/2010)   CO2:  28 (01/01/2010)   BUN:  18 (01/01/2010)   Creatinine:  0.9 (01/01/2010)   Albumin:  3.2 (08/05/2009)   Total Protein:  7.7 (08/05/2009)   Calcium:  9.4 (01/01/2010)   Total Bili:  0.6 (08/05/2009)   Alk Phos:  77 (08/05/2009)   SGPT (ALT):  13 (08/05/2009)   SGOT (AST):  12 (08/05/2009)   Medications Prior to Update: 1)  Catapres 0.1 Mg Tabs (Clonidine Hcl) .... One By Mouth Three Times A Day 2)  Proair Hfa 108 (90 Base) Mcg/act Aers (Albuterol Sulfate) 3)  Cimetidine 200 Mg Tabs (Cimetidine) .... One By Mouth Bid 4)  Zyrtec Allergy 10 Mg Caps (Cetirizine Hcl) .... One By Mouth Once Daily 5)  Freestyle Lancets  Misc (Lancets) .... Test Up To Two Times A Day As Needed Dx 250.00 6)  Benicar 20 Mg Tabs (Olmesartan Medoxomil) .... One By Mouth Once Daily For High Blood Pressure 7)  Bee Pollen 8)  Janumet 50-1000 Mg Tabs (Sitagliptin-Metformin Hcl) .... One By Mouth Two Times A Day For Diabetes  Current Medications (verified): 1)  Freestyle Lancets  Misc (Lancets) .... Test Up To Two Times A Day As Needed Dx 250.00 2)  Benicar 20 Mg Tabs (Olmesartan Medoxomil) .... One By Mouth Once Daily For High Blood Pressure 3)  Bee Pollen 4)  Janumet 50-1000 Mg Tabs (Sitagliptin-Metformin Hcl) .... One By Mouth Two Times A Day For Diabetes 5)  Terbinafine Hcl 250 Mg Tabs (Terbinafine Hcl) .... One By Mouth Once Daily For Toenail Fungus 6)  Lac-Hydrin Five 5 % Lotn (Ammonium Lactate) .... Apply To Dry Skin Ad Prn  Allergies (verified): 1)  ! * Lisinopril 2)  ! 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 Family History of Colon CA 1st degree relative <60 Family History Diabetes 1st degree relative Family History Hypertension Family History of Prostate CA 1st degree relative <50 Family History of Stroke F 1st degree relative <60  Social History: Reviewed history from 08/05/2009 and no changes required. 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  The patient denies anorexia, fever, weight loss, chest pain, syncope, dyspnea on exertion, peripheral edema, prolonged cough, headaches, hemoptysis, abdominal pain, hematuria, suspicious skin lesions, difficulty walking, depression, enlarged lymph nodes, and angioedema.   Derm:  Denies changes in color of skin, changes in nail beds, dryness, excessive perspiration, flushing, hair loss, insect bite(s), itching, poor wound healing, and rash. Endo:  Complains of polyuria; denies cold intolerance, excessive hunger, excessive thirst, excessive urination, heat intolerance, and weight change. Heme:  Denies abnormal bruising,  bleeding, enlarge lymph nodes, fevers, pallor, and skin discoloration.  Physical Exam  General:  alert, well-developed, well-nourished, well-hydrated, appropriate dress, cooperative to examination, good hygiene, and overweight-appearing.   Head:  normocephalic, atraumatic, no abnormalities observed, and no abnormalities palpated.   Eyes:  No corneal or conjunctival inflammation noted. EOMI. Perrla. Funduscopic exam benign, without hemorrhages, exudates or papilledema. Vision grossly normal. Mouth:  Oral mucosa and oropharynx without lesions or exudates.  Teeth in good repair. Neck:  supple, full ROM, no masses, no thyromegaly, no JVD, normal carotid upstroke, and no carotid bruits.   Lungs:  normal respiratory effort, no intercostal retractions, no accessory muscle use, normal breath sounds, no dullness, no fremitus, no crackles, and no wheezes.   Heart:  normal rate, regular rhythm, no murmur, no gallop, no rub, and no JVD.   Abdomen:  soft, non-tender, normal bowel sounds, no distention, no masses, no guarding, no rigidity, no hepatomegaly, and no splenomegaly.   Msk:  No deformity or scoliosis noted of thoracic or lumbar spine.   Pulses:  R and L carotid,radial,femoral,dorsalis pedis and posterior tibial pulses are full and equal bilaterally Extremities:  No clubbing, cyanosis, edema, or deformity noted with normal full range of motion of all joints.   Neurologic:  No cranial nerve deficits noted. Station and gait are normal. Plantar reflexes are down-going bilaterally. DTRs are symmetrical throughout. Sensory, motor and coordinative functions appear intact. Skin:  She has xerosis but no discreet lesions. Her toenails are dark, thick, have subungual debris, and some lysis but there is no erythema Cervical Nodes:  no anterior cervical adenopathy and no posterior cervical adenopathy.   Axillary Nodes:  no R axillary adenopathy and no L axillary adenopathy.   Inguinal Nodes:  no R inguinal  adenopathy and no L inguinal adenopathy.   Psych:  Cognition and judgment appear intact. Alert and cooperative with normal attention span and concentration. No apparent delusions, illusions, hallucinations  Diabetes Management Exam:    Foot Exam (with socks and/or shoes not present):       Sensory-Pinprick/Light touch:          Left medial foot (L-4): normal          Left dorsal foot (L-5): normal  Left lateral foot (S-1): normal          Right medial foot (L-4): normal          Right dorsal foot (L-5): normal          Right lateral foot (S-1): normal       Sensory-Monofilament:          Left foot: normal          Right foot: normal       Inspection:          Left foot: normal          Right foot: normal       Nails:          Left foot: ingrown          Right foot: fungal infection   Impression & Recommendations:  Problem # 1:  DRY SKIN (ICD-701.1) try lachydrin lotion  Problem # 2:  ONYCHOMYCOSIS, TOENAILS (ICD-110.1) Assessment: New  Her updated medication list for this problem includes:    Terbinafine Hcl 250 Mg Tabs (Terbinafine hcl) ..... One by mouth once daily for toenail fungus  Orders: Venipuncture ([address]) TLB-Lipid Panel (80061-LIPID) TLB-BMP (Basic Metabolic Panel-BMET) (80048-METABOL) TLB-CBC Platelet - w/Differential (85025-CBCD) TLB-Hepatic/Liver Function Pnl (80076-HEPATIC) TLB-TSH (Thyroid Stimulating Hormone) (84443-TSH) TLB-A1C / Hgb A1C (Glycohemoglobin) (83036-A1C) TLB-Udip w/ Micro (81001-URINE)  Problem # 3:  HYPERTENSION (ICD-401.9) Assessment: Improved  The following medications were removed from the medication list:    Catapres 0.1 Mg Tabs (Clonidine hcl) ..... One by mouth three times a day Her updated medication list for this problem includes:    Benicar 20 Mg Tabs (Olmesartan medoxomil) ..... One by mouth once daily for high blood pressure  Orders: Venipuncture ([address]) TLB-Lipid Panel (80061-LIPID) TLB-BMP (Basic Metabolic  Panel-BMET) (80048-METABOL) TLB-CBC Platelet - w/Differential (85025-CBCD) TLB-Hepatic/Liver Function Pnl (80076-HEPATIC) TLB-TSH (Thyroid Stimulating Hormone) (84443-TSH) TLB-A1C / Hgb A1C (Glycohemoglobin) (83036-A1C) TLB-Udip w/ Micro (81001-URINE)  BP today: 130/80 Prior BP: 140/80 (01/01/2010)  10 Yr Risk Heart Disease: 9 % Prior 10 Yr Risk Heart Disease: 13 % (01/01/2010)  Labs Reviewed: K+: 4.4 (01/01/2010) Creat: : 0.9 (01/01/2010)   Chol: 139 (10/30/2009)   HDL: 55.60 (10/30/2009)   LDL: 73 (10/30/2009)   TG: 53.0 (10/30/2009)  Problem # 4:  DIABETES MELLITUS, TYPE II (ICD-250.00) Assessment: Deteriorated  Her updated medication list for this problem includes:    Benicar 20 Mg Tabs (Olmesartan medoxomil) ..... One by mouth once daily for high blood pressure    Janumet 50-1000 Mg Tabs (Sitagliptin-metformin hcl) ..... One by mouth two times a day for diabetes  Orders: Venipuncture ([address]) TLB-Lipid Panel (80061-LIPID) TLB-BMP (Basic Metabolic Panel-BMET) (80048-METABOL) TLB-CBC Platelet - w/Differential (85025-CBCD) TLB-Hepatic/Liver Function Pnl (80076-HEPATIC) TLB-TSH (Thyroid Stimulating Hormone) (84443-TSH) TLB-A1C / Hgb A1C (Glycohemoglobin) (83036-A1C) TLB-Udip w/ Micro (81001-URINE)  Labs Reviewed: Creat: 0.9 (01/01/2010)     Last Eye Exam: normal (04/12/2009) Reviewed HgBA1c results: 7.6 (01/01/2010)  7.5 (10/30/2009)  Problem # 5:  ANEMIA-NOS (ICD-285.9) Assessment: Unchanged  Orders: Venipuncture ([address]78) TLB-Lipid Panel (80061-LIPID) TLB-BMP (Basic Metabolic Panel-BMET) (80048-METABOL) TLB-CBC Platelet - w/Differential (85025-CBCD) TLB-Hepatic/Liver Function Pnl (80076-HEPATIC) TLB-TSH (Thyroid Stimulating Hormone) (84443-TSH) TLB-A1C / Hgb A1C (Glycohemoglobin) (83036-A1C) TLB-Udip w/ Micro (81001-URINE)  Complete Medication List: 1)  Freestyle Lancets Misc (Lancets) .... Test up to two times a day as needed dx 250.00 2)  Benicar 20 Mg Tabs  (Olmesartan medoxomil) .... One by mouth once daily for high blood pressure 3)  Bee Pollen  4)  Janumet 50-1000 Mg  Tabs (Sitagliptin-metformin hcl) .... One by mouth two times a day for diabetes 5)  Terbinafine Hcl 250 Mg Tabs (Terbinafine hcl) .... One by mouth once daily for toenail fungus 6)  Lac-hydrin Five 5 % Lotn (Ammonium lactate) .... Apply to dry skin ad prn  Hypertension Assessment/Plan:      The patient's hypertensive risk group is category C: Target organ damage and/or diabetes.  Her calculated 10 year risk of coronary heart disease is 9 %.  Today's blood pressure is 130/80.  Her blood pressure goal is < 130/80.  Patient Instructions: 1)  Please schedule a follow-up appointment in 2 months. 2)  It is important that you exercise regularly at least 20 minutes 5 times a week. If you develop chest pain, have severe difficulty breathing, or feel very tired , stop exercising immediately and seek medical attention. 3)  You need to lose weight. Consider a lower calorie diet and regular exercise.  4)  Schedule your mammogram. 5)  Schedule a colonoscopy/sigmoidoscopy to help detect colon cancer. 6)  You need to have a Pap Smear to prevent cervical cancer. 7)  Take an Aspirin every day. 8)  Check your blood sugars regularly. If your readings are usually above 200 or below 70 you should contact our office. 9)  It is important that your Diabetic A1c level is checked every 3 months. 10)  See your eye doctor yearly to check for diabetic eye damage. 11)  Check your feet each night for sore areas, calluses or signs of infection. 12)  Check your Blood Pressure regularly. If it is above 130/80: you should make an appointment. Prescriptions: LAC-HYDRIN FIVE 5 % LOTN (AMMONIUM LACTATE) apply to dry skin ad prn  #1 bottle x 11   Entered and Authorized by:   Etta Grandchild MD   Signed by:   Etta Grandchild MD on 08/29/2010   Method used:   Electronically to        [facility] [phone]*  (retail)       [address]       [place], [place]  [address]       Ph: [phone]       Fax: [phone]   RxID:   [id number] JANUMET 50-1000 MG TABS (SITAGLIPTIN-METFORMIN HCL) One by mouth two times a day for diabetes  #180 x 3   Entered and Authorized by:   Etta Grandchild MD   Signed by:   Etta Grandchild MD on 08/29/2010   Method used:   Electronically to        [facility] [phone]* (retail)       [address]       [place], [place]  [address]       Ph: [phone]       Fax: [phone]   RxID:   [id number] BENICAR 20 MG TABS (OLMESARTAN MEDOXOMIL) One by mouth once daily for high blood pressure  #90 x 3   Entered and Authorized by:   Etta Grandchild MD   Signed by:   Etta Grandchild MD on 08/29/2010   Method used:   Electronically to        [facility] [phone]* (retail)       [address]       [place], [place]  [address]       Ph: [phone]       Fax: [phone]   RxID:   [id number] TERBINAFINE HCL 250 MG TABS (  TERBINAFINE HCL) One by mouth once daily for toenail fungus  #30 x 3   Entered and Authorized by:   Etta Grandchild MD   Signed by:   Etta Grandchild MD on 08/29/2010   Method used:   Electronically to        [facility] [phone]* (retail)       [address]       [place], [place]  [address]       Ph: [phone]       Fax: [phone]   RxID:   [id number]    Orders Added: 1)  Venipuncture [66440] 2)  TLB-Lipid Panel [80061-LIPID] 3)  TLB-BMP (Basic Metabolic Panel-BMET) [80048-METABOL] 4)  TLB-CBC Platelet - w/Differential [85025-CBCD] 5)  TLB-Hepatic/Liver Function Pnl [80076-HEPATIC] 6)  TLB-TSH (Thyroid Stimulating Hormone) [84443-TSH] 7)  TLB-A1C / Hgb A1C (Glycohemoglobin) [83036-A1C] 8)  TLB-Udip w/ Micro [81001-URINE] 9)  Est. Patient Level V [347[address]]

## 2010-09-03 NOTE — Progress Notes (Signed)
     New Problems: ROUTINE GENERAL MEDICAL EXAM@HEALTH  CARE FACL (ICD-V70.0)   New Problems: ROUTINE GENERAL MEDICAL EXAM@HEALTH  CARE FACL (ICD-V70.0)

## 2010-09-12 ENCOUNTER — Ambulatory Visit
Admission: RE | Admit: 2010-09-12 | Discharge: 2010-09-12 | Disposition: A | Discharge status: Home / Self Care | Payer: 59 | Source: Ambulatory Visit | Attending: Internal Medicine | Admitting: Internal Medicine

## 2010-09-12 DIAGNOSIS — Z1231 Encounter for screening mammogram for malignant neoplasm of breast: Secondary | ICD-10-CM

## 2010-09-12 LAB — HM MAMMOGRAPHY: HM Mammogram: NORMAL

## 2010-09-18 ENCOUNTER — Telehealth: Payer: Self-pay | Admitting: Internal Medicine

## 2010-09-23 NOTE — Progress Notes (Signed)
    PAP Screening:    Last PAP smear:  03/27/2009  Mammogram Screening:    Last Mammogram:  09/12/2010  Mammogram Results:    Date of Exam:  09/12/2010    Results:  Normal Bilateral  Osteoporosis Risk Assessment:  Risk Factors for Fracture or Low Bone Density:   Smoking status:       quit > 6 months  Immunization & Chemoprophylaxis:    Tetanus vaccine: Td  (07/30/1998)    Influenza vaccine: given  (04/26/2009)    Pneumovax: Pneumovax  (05/16/2009)

## 2010-10-14 ENCOUNTER — Telehealth: Payer: Self-pay | Admitting: Internal Medicine

## 2010-10-14 NOTE — Telephone Encounter (Signed)
Daughter, Glee Arvin, called to inform us that Pt has been having trouble breathing since last night. Pt stopped using ProAir inhaler in 08/2010 and had removed from med list - Informed to always keep rescue inhaler available when asthmatic.  Caller states that Pt has been having SOB, hot flashes, coughing, wheezing, stomach pain. Pt called daughter from work (where she is now) and was having trouble breathing when she got up to go to bathroom and could be heard wheezing and trying to get breath over the telephone.  Had caller try to contact Pt via cell phone while on line, but could not reach. Caller will continue to try & reach Pt to inform her to leave work immediately and go to Urgent Care or ER to be assessed and/or get breathing treatment. Caller will call back and inform us when Pt has been contacted & update her situation.

## 2010-10-15 LAB — URINE MICROSCOPIC-ADD ON

## 2010-10-15 LAB — DIFFERENTIAL
Basophils Absolute: 0 10*3/uL (ref 0.0–0.1)
Basophils Relative: 0 % (ref 0–1)
Eosinophils Absolute: 0.2 10*3/uL (ref 0.0–0.7)
Eosinophils Relative: 3 % (ref 0–5)
Lymphocytes Relative: 20 % (ref 12–46)
Lymphs Abs: 1.4 10*3/uL (ref 0.7–4.0)
Monocytes Absolute: 0.6 10*3/uL (ref 0.1–1.0)
Monocytes Relative: 9 % (ref 3–12)
Neutro Abs: 4.7 10*3/uL (ref 1.7–7.7)
Neutrophils Relative %: 68 % (ref 43–77)

## 2010-10-15 LAB — URINALYSIS, ROUTINE W REFLEX MICROSCOPIC
Bilirubin Urine: NEGATIVE
Glucose, UA: NEGATIVE mg/dL
Hgb urine dipstick: NEGATIVE
Ketones, ur: NEGATIVE mg/dL
Nitrite: NEGATIVE
Protein, ur: 100 mg/dL — AB
Specific Gravity, Urine: 1.012 (ref 1.005–1.030)
Urobilinogen, UA: 1 mg/dL (ref 0.0–1.0)
pH: 7.5 (ref 5.0–8.0)

## 2010-10-15 LAB — COMPREHENSIVE METABOLIC PANEL
ALT: 13 U/L (ref 0–35)
AST: 14 U/L (ref 0–37)
Albumin: 3.4 g/dL — ABNORMAL LOW (ref 3.5–5.2)
Alkaline Phosphatase: 78 U/L (ref 39–117)
BUN: 14 mg/dL (ref 6–23)
CO2: 25 mEq/L (ref 19–32)
Calcium: 9.2 mg/dL (ref 8.4–10.5)
Chloride: 107 mEq/L (ref 96–112)
Creatinine, Ser: 0.8 mg/dL (ref 0.4–1.2)
GFR calc Af Amer: >60 mL/min (ref >60)
GFR calc non Af Amer: >60 mL/min (ref >60)
Glucose, Bld: 197 mg/dL — ABNORMAL HIGH (ref 70–99)
Potassium: 4.1 mEq/L (ref 3.5–5.1)
Sodium: 138 mEq/L (ref 135–145)
Total Bilirubin: 0.3 mg/dL (ref 0.3–1.2)
Total Protein: 7.3 g/dL (ref 6.0–8.3)

## 2010-10-15 LAB — URINE CULTURE: Colony Count: 45000

## 2010-10-15 LAB — CBC
HCT: 34.3 % — ABNORMAL LOW (ref 36.0–46.0)
Hemoglobin: 11 g/dL — ABNORMAL LOW (ref 12.0–15.0)
MCHC: 32.1 g/dL (ref 30.0–36.0)
MCV: 71.4 fL — ABNORMAL LOW (ref 78.0–100.0)
Platelets: 291 10*3/uL (ref 150–400)
RBC: 4.8 MIL/uL (ref 3.87–5.11)
RDW: 19.1 % — ABNORMAL HIGH (ref 11.5–15.5)
WBC: 6.9 10*3/uL (ref 4.0–10.5)

## 2010-10-15 LAB — PREGNANCY, URINE: Preg Test, Ur: NEGATIVE

## 2010-10-17 NOTE — Telephone Encounter (Signed)
Re-established ProAir HFT. Pt informed.

## 2010-10-22 DIAGNOSIS — Z0279 Encounter for issue of other medical certificate: Secondary | ICD-10-CM

## 2010-10-29 LAB — TSH: TSH: 2.249 u[IU]/mL (ref 0.350–4.500)

## 2010-10-29 LAB — CBC
HCT: 34.8 % — ABNORMAL LOW (ref 36.0–46.0)
MCHC: 32.2 g/dL (ref 30.0–36.0)
Platelets: 217 10*3/uL (ref 150–400)
RBC: 5.08 MIL/uL (ref 3.87–5.11)
WBC: 5.9 10*3/uL (ref 4.0–10.5)
WBC: 6.5 10*3/uL (ref 4.0–10.5)

## 2010-10-29 LAB — COMPREHENSIVE METABOLIC PANEL
ALT: 18 U/L (ref 0–35)
AST: 20 U/L (ref 0–37)
CO2: 27 mEq/L (ref 19–32)
Calcium: 8.9 mg/dL (ref 8.4–10.5)
GFR calc Af Amer: >60 mL/min (ref >60)
GFR calc non Af Amer: >60 mL/min (ref >60)
Potassium: 3.7 mEq/L (ref 3.5–5.1)
Sodium: 134 mEq/L — ABNORMAL LOW (ref 135–145)
Total Protein: 7.3 g/dL (ref 6.0–8.3)

## 2010-10-29 LAB — TROPONIN I: Troponin I: 0.02 ng/mL (ref 0.00–0.06)

## 2010-10-29 LAB — CARDIAC PANEL(CRET KIN+CKTOT+MB+TROPI)
CK, MB: 1.6 ng/mL (ref 0.3–4.0)
Total CK: 77 U/L (ref 7–177)
Troponin I: 0.02 ng/mL (ref 0.00–0.06)

## 2010-10-29 LAB — URINALYSIS, ROUTINE W REFLEX MICROSCOPIC
Bilirubin Urine: NEGATIVE
Glucose, UA: >1000 mg/dL — AB
Ketones, ur: NEGATIVE mg/dL
pH: 6.5 (ref 5.0–8.0)

## 2010-10-29 LAB — GLUCOSE, CAPILLARY
Glucose-Capillary: 225 mg/dL — ABNORMAL HIGH (ref 70–99)
Glucose-Capillary: 257 mg/dL — ABNORMAL HIGH (ref 70–99)
Glucose-Capillary: 369 mg/dL — ABNORMAL HIGH (ref 70–99)
Glucose-Capillary: 481 mg/dL — ABNORMAL HIGH (ref 70–99)

## 2010-10-29 LAB — POCT CARDIAC MARKERS
CKMB, poc: 1.2 ng/mL (ref 1.0–8.0)
Myoglobin, poc: 66.4 ng/mL (ref 12–200)

## 2010-10-29 LAB — CK TOTAL AND CKMB (NOT AT ARMC): Relative Index: INVALID (ref 0.0–2.5)

## 2010-10-29 LAB — URINE MICROSCOPIC-ADD ON

## 2010-10-29 LAB — BASIC METABOLIC PANEL
BUN: 8 mg/dL (ref 6–23)
Chloride: 103 mEq/L (ref 96–112)
Glucose, Bld: 166 mg/dL — ABNORMAL HIGH (ref 70–99)
Potassium: 3.5 mEq/L (ref 3.5–5.1)

## 2010-10-29 LAB — DIFFERENTIAL
Eosinophils Absolute: 0.2 10*3/uL (ref 0.0–0.7)
Eosinophils Relative: 3 % (ref 0–5)
Lymphs Abs: 1.9 10*3/uL (ref 0.7–4.0)
Monocytes Relative: 9 % (ref 3–12)

## 2010-10-29 LAB — POCT PREGNANCY, URINE: Preg Test, Ur: NEGATIVE

## 2010-11-04 ENCOUNTER — Telehealth: Payer: Self-pay | Admitting: *Deleted

## 2010-11-04 NOTE — Telephone Encounter (Signed)
Pt c/o severe h/a, dizzyness and nausea since Saturday. She req rx from MD due to no open apts. I advised she go to UC OR ER NOW, Pt agreed

## 2010-12-12 NOTE — Op Note (Signed)
Lori Mclaughlin, OCONNELL        ACCOUNT NO.:  [id number]   MEDICAL RECORD NO.:  [id number]          PATIENT TYPE:  AMB   LOCATION:  SDC                           FACILITY:  [facility]   PHYSICIAN:  Sherry A. Dickstein, M.D.DATE OF BIRTH:  03-07-1961   DATE OF PROCEDURE:  09/24/2004  DATE OF DISCHARGE:                                 OPERATIVE REPORT   PREOPERATIVE DIAGNOSES:  1.  Menorrhagia.  2.  Thickened endometrium.   POSTOPERATIVE DIAGNOSES:  1.  Menorrhagia.  2.  Thickened endometrium.  3.  Submucosal fibroid.   SURGEON:  Sherry A. Rosalio Macadamia, M.D.   ANESTHESIA:  General.   PROCEDURE:  Dilatation and curettage hysteroscopy with resectoscope.   INDICATIONS:  This is a 50 year old G10, P4-0-6-2, woman who has had  irregular bleeding and heavy menstrual periods for many years.  The patient  was initially treated with Prometrium 200 mg each month x12 days.  The  bleeding persisted.  Endometrial biopsy was attempted in the office;  however, endometrial biopsy could not be performed because of her obesity.  Ultrasound was performed after a period, which showed a very thickened  endometrium with a possible polyp or submucosal fibroid present.  Because of  this, the patient is brought to the operating room for [facility] hysteroscopy with  resectoscope.   FINDINGS:  A 10-week sized anteflexed uterus, no adnexal masses that could  be palpated.  A submucosal fibroid in the posterior wall of the uterus and  thickened endometrium.   PROCEDURE:  The patient was brought into the operating room and given  adequate general anesthesia.  She was placed in a dorsal lithotomy position.  Her perineum was washed with Hibiclens.  The vagina was washed with  Betadine.  I&O catheterization was performed.  A pelvic examination was  performed.  The patient was draped in a sterile fashion.  A speculum was  placed within the vagina.  A paracervical block was administered with 1%  Nesacaine.  The anterior  lip of the cervix was grasped with a single-tooth  tenaculum.  The cervix was sounded and cervix was dilated with Shawnie Pons  dilators to a #31.  A hysteroscope was introduced into the endometrial  cavity and pictures were obtained.  The submucosal fibroid was resected  first with a double-loop resector.  Then taking sheets of endometrial  tissue, the endometrial cavity was resected circumferentially.  Adequate  hemostasis was present.  Pictures were obtained, all instruments removed  from the vagina.  The patient was taken out of the dorsal lithotomy  position.  She was awakened.  She was moved from the operating room to a  stretcher in stable condition.   COMPLICATIONS:  None.   ESTIMATED BLOOD LOSS:  Less than 5 mL.   SORBITOL DIFFERENTIAL:  Minus 30 mL.   SPECIMENS:  1.  Submucosal fibroid.  2.  Endometrial resection.      SAD/MEDQ  D:  09/24/2004  T:  09/24/2004  Job:  [id number]

## 2010-12-19 ENCOUNTER — Other Ambulatory Visit: Payer: 59 | Admitting: Internal Medicine

## 2011-01-01 ENCOUNTER — Other Ambulatory Visit: Payer: 59 | Admitting: Internal Medicine

## 2011-01-08 ENCOUNTER — Ambulatory Visit: Payer: 59 | Admitting: Internal Medicine

## 2011-01-14 ENCOUNTER — Other Ambulatory Visit (INDEPENDENT_AMBULATORY_CARE_PROVIDER_SITE_OTHER): Payer: 59

## 2011-01-14 ENCOUNTER — Encounter: Payer: Self-pay | Admitting: Internal Medicine

## 2011-01-14 ENCOUNTER — Ambulatory Visit (INDEPENDENT_AMBULATORY_CARE_PROVIDER_SITE_OTHER): Payer: 59 | Admitting: Internal Medicine

## 2011-01-14 DIAGNOSIS — E7800 Pure hypercholesterolemia, unspecified: Secondary | ICD-10-CM

## 2011-01-14 DIAGNOSIS — E785 Hyperlipidemia, unspecified: Secondary | ICD-10-CM | POA: Insufficient documentation

## 2011-01-14 DIAGNOSIS — J45909 Unspecified asthma, uncomplicated: Secondary | ICD-10-CM

## 2011-01-14 DIAGNOSIS — D649 Anemia, unspecified: Secondary | ICD-10-CM

## 2011-01-14 DIAGNOSIS — I10 Essential (primary) hypertension: Secondary | ICD-10-CM

## 2011-01-14 DIAGNOSIS — E119 Type 2 diabetes mellitus without complications: Secondary | ICD-10-CM

## 2011-01-14 LAB — IBC PANEL
Iron: 25 ug/dL — ABNORMAL LOW (ref 42–145)
Saturation Ratios: 5.7 % — ABNORMAL LOW (ref 20.0–50.0)
Transferrin: 310.9 mg/dL (ref 212.0–360.0)

## 2011-01-14 LAB — CBC WITH DIFFERENTIAL/PLATELET
Eosinophils Absolute: 0.2 10*3/uL (ref 0.0–0.7)
Eosinophils Relative: 3.6 % (ref 0.0–5.0)
Lymphocytes Relative: 23.4 % (ref 12.0–46.0)
MCV: 70.9 fl — ABNORMAL LOW (ref 78.0–100.0)
Monocytes Absolute: 0.4 10*3/uL (ref 0.1–1.0)
Neutrophils Relative %: 64.9 % (ref 43.0–77.0)
Platelets: 262 10*3/uL (ref 150.0–400.0)
WBC: 6.1 10*3/uL (ref 4.5–10.5)

## 2011-01-14 LAB — COMPREHENSIVE METABOLIC PANEL
ALT: 15 U/L (ref 0–35)
AST: 16 U/L (ref 0–37)
Chloride: 104 mEq/L (ref 96–112)
Creatinine, Ser: 1 mg/dL (ref 0.4–1.2)
Sodium: 137 mEq/L (ref 135–145)
Total Bilirubin: 0.3 mg/dL (ref 0.3–1.2)

## 2011-01-14 LAB — URINALYSIS, ROUTINE W REFLEX MICROSCOPIC
Ketones, ur: NEGATIVE
Specific Gravity, Urine: >=1.03 (ref 1.000–1.030)
Urine Glucose: NEGATIVE
pH: 5.5 (ref 5.0–8.0)

## 2011-01-14 LAB — LIPID PANEL
HDL: 55.3 mg/dL (ref >39.00)
LDL Cholesterol: 112 mg/dL — ABNORMAL HIGH (ref 0–99)
VLDL: 14.2 mg/dL (ref 0.0–40.0)

## 2011-01-14 LAB — HEMOGLOBIN A1C: Hgb A1c MFr Bld: 10.2 % — ABNORMAL HIGH (ref 4.6–6.5)

## 2011-01-14 LAB — TSH: TSH: 1.85 u[IU]/mL (ref 0.35–5.50)

## 2011-01-14 MED ORDER — EZETIMIBE-SIMVASTATIN 10-20 MG PO TABS: 1.0000 | ORAL_TABLET | Freq: Every day | ORAL | Status: DC

## 2011-01-14 MED ORDER — OLMESARTAN MEDOXOMIL 40 MG PO TABS: 40.0000 mg | ORAL_TABLET | Freq: Every day | ORAL | Status: DC

## 2011-01-14 MED ORDER — SAXAGLIPTIN-METFORMIN ER 5-1000 MG PO TB24: 1.0000 | ORAL_TABLET | Freq: Every day | ORAL | Status: DC

## 2011-01-14 NOTE — Patient Instructions (Signed)
Diabetes, Type 2 Diabetes is a lasting (chronic) disease. In type 2 diabetes, the pancreas does not make enough insulin (a hormone), and the body does not respond normally to the insulin that is made. This type of diabetes was also previously called adult onset diabetes. About 90% of all those who have diabetes have type 2. It usually occurs after the age of 40 but can occur at any age. CAUSES Unlike type 1 diabetes, which happens because insulin is no longer being made, type 2 diabetes happens because the body is making less insulin and has trouble using the insulin properly. SYMPTOMS  Drinking more than usual.   Urinating more than usual.   Blurred vision.   Dry, itchy skin.   Frequent infection like yeast infections in women.   More tired than usual (fatigue).  TREATMENT  Healthy eating.   Exercise.   Medication, if needed.   Monitoring blood glucose (sugar).   Seeing your caregiver regularly.  HOME CARE INSTRUCTIONS  Check your blood glucose (sugar) at least once daily. More frequent monitoring may be necessary, depending on your medications and on how well your diabetes is controlled. Your caregiver will advise you.   Take your medicine as directed by your caregiver.   Do not smoke.   Make wise food choices. Ask your caregiver for information. Weight loss can improve your diabetes.   Learn about low blood glucose (hypoglycemia) and how to treat it.   Get your eyes checked regularly.   Have a yearly physical exam. Have your blood pressure checked. Get your blood and urine tested.   Wear a pendant or bracelet saying that you have diabetes.   Check your feet every night for sores. Let your caregiver know if you have sores that are not healing.  SEEK MEDICAL CARE IF:  You are having problems keeping your blood glucose at target range.   You feel you might be having problems with your medicines.   You have symptoms of an illness that is not improving after 24  hours.   You have a sore or wound that is not healing.   You notice a change in vision or a new problem with your vision.   You develop a fever of more than 100.5.  Document Released: 07/13/2005 Document Re-Released: 08/04/2009 Exit[employer] Patient Information 2011 [employer]. 

## 2011-01-14 NOTE — Progress Notes (Signed)
Subjective:    Patient ID: Lori Mclaughlin, female    DOB: 05-13-1961, 50 y.o.   MRN: [id number]  Diabetes She presents for her follow-up diabetic visit. She has type 2 diabetes mellitus. Her disease course has been worsening. There are no hypoglycemic associated symptoms. Pertinent negatives for hypoglycemia include no dizziness, headaches, pallor, seizures, speech difficulty, sweats or tremors. Pertinent negatives for diabetes include no blurred vision, no chest pain, no fatigue, no foot paresthesias, no foot ulcerations, no polydipsia, no polyphagia, no polyuria, no visual change, no weakness and no weight loss. There are no hypoglycemic complications. Symptoms are stable. Diabetic complications include retinopathy. Current diabetic treatment includes oral agent (dual therapy). She is compliant with treatment most of the time. Her weight is stable. She is following a generally healthy diet. Meal planning includes avoidance of concentrated sweets. She has had a previous visit with a dietician. She participates in exercise intermittently. Her home blood glucose trend is increasing steadily. Her breakfast blood glucose range is generally 110-130 mg/dl. Her lunch blood glucose range is generally 110-130 mg/dl. Her dinner blood glucose range is generally 110-130 mg/dl. Her highest blood glucose is >200 mg/dl. Her overall blood glucose range is 110-130 mg/dl. An ACE inhibitor/angiotensin II receptor blocker is being taken. She does not see a podiatrist.Eye exam is current.  Hypertension This is a chronic problem. The current episode started more than 1 year ago. The problem has been gradually worsening since onset. The problem is uncontrolled. Pertinent negatives include no anxiety, blurred vision, chest pain, headaches, malaise/fatigue, neck pain, orthopnea, palpitations, peripheral edema, PND, shortness of breath or sweats. There are no associated agents to hypertension. Past treatments include  angiotensin blockers. The current treatment provides moderate improvement. Compliance problems include exercise and diet.  Hypertensive end-organ damage includes retinopathy.      Review of Systems  Constitutional: Negative for fever, chills, weight loss, malaise/fatigue, diaphoresis, activity change, appetite change, fatigue and unexpected weight change.  HENT: Negative for hearing loss, ear pain, sore throat, facial swelling, trouble swallowing, neck pain, neck stiffness, voice change, tinnitus and ear discharge.   Eyes: Negative.  Negative for blurred vision.  Respiratory: Negative for apnea, cough, choking, chest tightness, shortness of breath, wheezing and stridor.   Cardiovascular: Negative for chest pain, palpitations, orthopnea and PND.  Gastrointestinal: Negative for nausea, vomiting, abdominal pain, diarrhea, constipation, blood in stool and anal bleeding.  Genitourinary: Negative for dysuria, urgency, polyuria, frequency, hematuria, flank pain, decreased urine volume, enuresis, difficulty urinating and dyspareunia.  Musculoskeletal: Negative for myalgias, back pain, joint swelling, arthralgias and gait problem.  Skin: Negative for color change, pallor and rash.  Neurological: Negative for dizziness, tremors, seizures, syncope, facial asymmetry, speech difficulty, weakness, light-headedness, numbness and headaches.  Hematological: Negative for polydipsia, polyphagia and adenopathy. Does not bruise/bleed easily.  Psychiatric/Behavioral: Negative.        Objective:   Physical Exam  Vitals reviewed. Constitutional: She is oriented to person, place, and time. She appears well-developed and well-nourished. No distress.  HENT:  Head: Normocephalic and atraumatic.  Right Ear: External ear normal.  Left Ear: External ear normal.  Nose: Nose normal.  Mouth/Throat: Oropharynx is clear and moist. No oropharyngeal exudate.  Eyes: Conjunctivae and EOM are normal. Pupils are equal, round,  and reactive to light. Right eye exhibits no discharge. Left eye exhibits no discharge. No scleral icterus.  Neck: Normal range of motion. Neck supple. No JVD present. No tracheal deviation present. No thyromegaly present.  Cardiovascular: Normal rate, regular rhythm,  normal heart sounds and intact distal pulses.  Exam reveals no gallop and no friction rub.   No murmur heard. Pulmonary/Chest: Effort normal and breath sounds normal. No stridor. No respiratory distress. She has no wheezes. She has no rales. She exhibits no tenderness.  Abdominal: Soft. Bowel sounds are normal. She exhibits no distension and no mass. There is no tenderness. There is no rebound and no guarding.  Musculoskeletal: Normal range of motion. She exhibits no edema and no tenderness.  Lymphadenopathy:    She has no cervical adenopathy.  Neurological: She is alert and oriented to person, place, and time. She has normal reflexes. She displays normal reflexes. No cranial nerve deficit. She exhibits normal muscle tone. Coordination normal.  Skin: Skin is warm and dry. No rash noted. She is not diaphoretic. No erythema. No pallor.  Psychiatric: She has a normal mood and affect. Her behavior is normal. Judgment and thought content normal.        Lab Results  Component Value Date   WBC 5.7 08/29/2010   HGB 10.9* 08/29/2010   HCT 33.8* 08/29/2010   PLT 274.0 08/29/2010   CHOL 147 08/29/2010   TRIG 56.0 08/29/2010   HDL 50.00 08/29/2010   ALT 12 08/29/2010   AST 13 08/29/2010   NA 137 08/29/2010   K 4.8 08/29/2010   CL 104 08/29/2010   CREATININE 0.8 08/29/2010   BUN 14 08/29/2010   CO2 27 08/29/2010   TSH 1.50 08/29/2010   HGBA1C 11.3* 08/29/2010   MICROALBUR 4.6* 08/05/2009    Assessment & Plan:

## 2011-01-15 ENCOUNTER — Encounter: Payer: Self-pay | Admitting: Internal Medicine

## 2011-01-15 NOTE — Assessment & Plan Note (Signed)
Continue proair.  °

## 2011-01-15 NOTE — Assessment & Plan Note (Signed)
Start vytorin 

## 2011-01-15 NOTE — Assessment & Plan Note (Signed)
Her BP is not well controlled so I have increased the Benicar to 40 mg per day

## 2011-01-15 NOTE — Assessment & Plan Note (Signed)
I changed her to [facility] today due to her concerns about cost and I have given her samples, today I will check her A1C and monitor her renal function

## 2011-01-15 NOTE — Assessment & Plan Note (Signed)
I see no evidence of blood loss, today I will recheck her CBC and look at her iron and B12 levels

## 2011-01-21 ENCOUNTER — Telehealth: Payer: Self-pay | Admitting: Internal Medicine

## 2011-01-21 NOTE — Telephone Encounter (Signed)
Message copied by Janeal Holmes on Wed Jan 21, 2011  9:56 PM ------      Message from: Vernie Murders      Created: Mon Jan 19, 2011  6:39 PM       Please review for Dr Yetta Barre      ----- Message -----         From: Lab [facility] Interface         Sent: 01/14/2011   1:59 PM           To: Etta Grandchild, MD

## 2011-01-21 NOTE — Telephone Encounter (Signed)
Pls sch appt w/Dr Yetta Barre to discuss labs Thx

## 2011-01-23 NOTE — Telephone Encounter (Signed)
Tried to call pt to inform of below. No working numbers

## 2011-01-27 NOTE — Telephone Encounter (Signed)
MD mailed lab letter on 6/20

## 2011-04-16 ENCOUNTER — Telehealth: Payer: Self-pay

## 2011-04-16 NOTE — Telephone Encounter (Signed)
Patient called requesting samples of Benicar

## 2011-04-21 NOTE — Telephone Encounter (Signed)
Patient notified

## 2011-06-15 ENCOUNTER — Telehealth: Payer: Self-pay

## 2011-06-15 NOTE — Telephone Encounter (Signed)
Patient called [facility] requesting samples of Benicar. She would like for her daughter to pick them up today. No phone number left by patient// called phone number on file and told incorrect. Samples placed upfront, closing phone note

## 2011-09-18 ENCOUNTER — Ambulatory Visit: Payer: 59 | Admitting: Internal Medicine

## 2011-09-18 DIAGNOSIS — Z0289 Encounter for other administrative examinations: Secondary | ICD-10-CM

## 2011-09-18 LAB — HM DIABETES EYE EXAM

## 2011-11-13 ENCOUNTER — Ambulatory Visit (INDEPENDENT_AMBULATORY_CARE_PROVIDER_SITE_OTHER): Payer: BC Managed Care – PPO | Admitting: Internal Medicine

## 2011-11-13 ENCOUNTER — Encounter: Payer: Self-pay | Admitting: Internal Medicine

## 2011-11-13 VITALS — BP 140/76 | HR 74 | Temp 98.6°F | Resp 16 | Wt 292.0 lb

## 2011-11-13 DIAGNOSIS — E119 Type 2 diabetes mellitus without complications: Secondary | ICD-10-CM

## 2011-11-13 DIAGNOSIS — R609 Edema, unspecified: Secondary | ICD-10-CM

## 2011-11-13 DIAGNOSIS — R079 Chest pain, unspecified: Secondary | ICD-10-CM

## 2011-11-13 DIAGNOSIS — D649 Anemia, unspecified: Secondary | ICD-10-CM

## 2011-11-13 DIAGNOSIS — K219 Gastro-esophageal reflux disease without esophagitis: Secondary | ICD-10-CM

## 2011-11-13 DIAGNOSIS — J45909 Unspecified asthma, uncomplicated: Secondary | ICD-10-CM

## 2011-11-13 DIAGNOSIS — E7800 Pure hypercholesterolemia, unspecified: Secondary | ICD-10-CM

## 2011-11-13 DIAGNOSIS — I10 Essential (primary) hypertension: Secondary | ICD-10-CM

## 2011-11-13 DIAGNOSIS — R1013 Epigastric pain: Secondary | ICD-10-CM

## 2011-11-13 DIAGNOSIS — G4730 Sleep apnea, unspecified: Secondary | ICD-10-CM

## 2011-11-13 DIAGNOSIS — G4733 Obstructive sleep apnea (adult) (pediatric): Secondary | ICD-10-CM | POA: Insufficient documentation

## 2011-11-13 MED ORDER — ESOMEPRAZOLE MAGNESIUM 40 MG PO CPDR: 40.0000 mg | DELAYED_RELEASE_CAPSULE | Freq: Every day | ORAL | Status: DC

## 2011-11-13 MED ORDER — COLESEVELAM HCL 3.75 G PO PACK: 1.0000 | PACK | Freq: Every day | ORAL | Status: DC

## 2011-11-13 MED ORDER — SITAGLIP PHOS-METFORMIN HCL ER 50-1000 MG PO TB24: 1.0000 | ORAL_TABLET | Freq: Two times a day (BID) | ORAL | Status: DC

## 2011-11-13 NOTE — Assessment & Plan Note (Signed)
I think this is c/w her GERD so I have asked her to start taking nexium, also I will check her labs today to look for organ pathology

## 2011-11-13 NOTE — Progress Notes (Signed)
Subjective:    Patient ID: Lori Mclaughlin, female    DOB: 1960-08-13, 51 y.o.   MRN: [id number]  Gastrophageal Reflux She complains of abdominal pain, belching and heartburn. She reports no chest pain, no choking, no coughing, no dysphagia, no early satiety, no globus sensation, no hoarse voice, no nausea, no sore throat, no stridor, no tooth decay, no water brash or no wheezing. This is a recurrent problem. The current episode started more than 1 year ago. The problem occurs frequently. The problem has been unchanged. The heartburn is located in the left chest and substernum. The heartburn is of moderate intensity. The heartburn does not wake her from sleep. The heartburn does not limit her activity. The heartburn doesn't change with position. The symptoms are aggravated by stress. Pertinent negatives include no fatigue, muscle weakness or weight loss. Risk factors include obesity, ETOH use and lack of exercise. She has tried nothing for the symptoms.  Chest Pain  This is a recurrent problem. Episode onset: 3-4 months ago. The onset quality is gradual. The problem occurs intermittently. The problem has been unchanged. Pain location: left upper anterior chest. The pain is at a severity of 2/10. The pain is mild. The quality of the pain is described as dull. The pain does not radiate. Associated symptoms include abdominal pain, lower extremity edema and malaise/fatigue. Pertinent negatives include no back pain, claudication, cough, diaphoresis, dizziness, exertional chest pressure, fever, headaches, hemoptysis, irregular heartbeat, leg pain, nausea, near-syncope, numbness, orthopnea, palpitations, PND, shortness of breath, sputum production, syncope, vomiting or weakness. The pain is aggravated by emotional upset. She has tried nothing for the symptoms. Risk factors include obesity and lack of exercise.  Pertinent negatives for past medical history include no muscle weakness and no seizures.       Review of Systems  Constitutional: Positive for malaise/fatigue. Negative for fever, chills, weight loss, diaphoresis, activity change, appetite change, fatigue and unexpected weight change.  HENT: Negative.  Negative for sore throat and hoarse voice.   Eyes: Negative.   Respiratory: Positive for apnea (and heavy snorig). Negative for cough, hemoptysis, sputum production, choking, chest tightness, shortness of breath, wheezing and stridor.   Cardiovascular: Positive for leg swelling. Negative for chest pain, palpitations, orthopnea, claudication, syncope, PND and near-syncope.  Gastrointestinal: Positive for heartburn and abdominal pain. Negative for dysphagia, nausea, vomiting, diarrhea, constipation, blood in stool, abdominal distention, anal bleeding and rectal pain.  Genitourinary: Negative.  Negative for dysuria, frequency, hematuria and flank pain.  Musculoskeletal: Negative for myalgias, back pain, joint swelling, arthralgias, gait problem and muscle weakness.  Skin: Negative for color change, pallor, rash and wound.  Neurological: Negative for dizziness, tremors, seizures, syncope, facial asymmetry, speech difficulty, weakness, light-headedness, numbness and headaches.  Hematological: Negative for adenopathy. Does not bruise/bleed easily.  Psychiatric/Behavioral: Negative.        Objective:   Physical Exam  Vitals reviewed. Constitutional: She is oriented to person, place, and time. She appears well-developed and well-nourished. No distress.  HENT:  Head: Normocephalic and atraumatic.  Mouth/Throat: Oropharynx is clear and moist. No oropharyngeal exudate.  Eyes: Conjunctivae are normal. Right eye exhibits no discharge. Left eye exhibits no discharge. No scleral icterus.  Neck: Normal range of motion. Neck supple. No JVD present. No tracheal deviation present. No thyromegaly present.  Cardiovascular: Normal rate, regular rhythm, normal heart sounds and intact distal pulses.   Exam reveals no gallop and no friction rub.   No murmur heard. Pulmonary/Chest: Effort normal and breath sounds normal. No  stridor. No respiratory distress. She has no wheezes. She has no rales. She exhibits no tenderness.  Abdominal: Soft. Bowel sounds are normal. She exhibits no distension and no mass. There is no tenderness. There is no rebound and no guarding.  Musculoskeletal: Normal range of motion. She exhibits edema (2+ edema in BLE's). She exhibits no tenderness.  Lymphadenopathy:    She has no cervical adenopathy.  Neurological: She is oriented to person, place, and time.  Skin: Skin is warm and dry. No rash noted. She is not diaphoretic. No erythema. No pallor.  Psychiatric: She has a normal mood and affect. Her behavior is normal. Judgment and thought content normal.      Lab Results  Component Value Date   WBC 6.1 01/14/2011   HGB 11.0* 01/14/2011   HCT 34.3* 01/14/2011   PLT 262.0 01/14/2011   GLUCOSE 166* 01/14/2011   CHOL 181 01/14/2011   TRIG 71.0 01/14/2011   HDL 55.30 01/14/2011   LDLCALC 112* 01/14/2011   ALT 15 01/14/2011   AST 16 01/14/2011   NA 137 01/14/2011   K 4.7 01/14/2011   CL 104 01/14/2011   CREATININE 1.0 01/14/2011   BUN 15 01/14/2011   CO2 27 01/14/2011   TSH 1.85 01/14/2011   HGBA1C 10.2* 01/14/2011   MICROALBUR 4.6* 08/05/2009      Assessment & Plan:

## 2011-11-13 NOTE — Assessment & Plan Note (Signed)
I will recheck her CBC today 

## 2011-11-13 NOTE — Assessment & Plan Note (Signed)
Her EKG is normal and her symptoms are not suggestive of ischemia, I will check a BNP for CHF and some cardiac enzymes to be more certain that she is not experiencing cardiac ischemia

## 2011-11-13 NOTE — Patient Instructions (Signed)
Diabetes, Type 2 Diabetes is a long-lasting (chronic) disease. In type 2 diabetes, the pancreas does not make enough insulin (a hormone), and the body does not respond normally to the insulin that is made. This type of diabetes was also previously called adult-onset diabetes. It usually occurs after the age of 40, but it can occur at any age.  CAUSES  Type 2 diabetes happens because the pancreasis not making enough insulin or your body has trouble using the insulin that your pancreas does make properly. SYMPTOMS   Drinking more than usual.   Urinating more than usual.   Blurred vision.   Dry, itchy skin.   Frequent infections.   Feeling more tired than usual (fatigue).  DIAGNOSIS The diagnosis of type 2 diabetes is usually made by one of the following tests:  Fasting blood glucose test. You will not eat for at least 8 hours and then take a blood test.   Random blood glucose test. Your blood glucose (sugar) is checked at any time of the day regardless of when you ate.   Oral glucose tolerance test (OGTT). Your blood glucose is measured after you have not eaten (fasted) and then after you drink a glucose containing beverage.  TREATMENT   Healthy eating.   Exercise.   Medicine, if needed.   Monitoring blood glucose.   Seeing your caregiver regularly.  HOME CARE INSTRUCTIONS   Check your blood glucose at least once a day. More frequent monitoring may be necessary, depending on your medicines and on how well your diabetes is controlled. Your caregiver will advise you.   Take your medicine as directed by your caregiver.   Do not smoke.   Make wise food choices. Ask your caregiver for information. Weight loss can improve your diabetes.   Learn about low blood glucose (hypoglycemia) and how to treat it.   Get your eyes checked regularly.   Have a yearly physical exam. Have your blood pressure checked and your blood and urine tested.   Wear a pendant or bracelet saying  that you have diabetes.   Check your feet every night for cuts, sores, blisters, and redness. Let your caregiver know if you have any problems.  SEEK MEDICAL CARE IF:   You have problems keeping your blood glucose in target range.   You have problems with your medicines.   You have symptoms of an illness that do not improve after 24 hours.   You have a sore or wound that is not healing.   You notice a change in vision or a new problem with your vision.   You have a fever.  MAKE SURE YOU:  Understand these instructions.   Will watch your condition.   Will get help right away if you are not doing well or get worse.  Document Released: 07/13/2005 Document Revised: 07/02/2011 Document Reviewed: 12/29/2010 Exit[employer] Patient Information 2012 [employer].Abdominal Pain Abdominal pain can be caused by many things. Your caregiver decides the seriousness of your pain by an examination and possibly blood tests and X-rays. Many cases can be observed and treated at home. Most abdominal pain is not caused by a disease and will probably improve without treatment. However, in many cases, more time must pass before a clear cause of the pain can be found. Before that point, it may not be known if you need more testing, or if hospitalization or surgery is needed. HOME CARE INSTRUCTIONS   Do not take laxatives unless directed by your caregiver.     Take pain medicine only as directed by your caregiver.   Only take over-the-counter or prescription medicines for pain, discomfort, or fever as directed by your caregiver.   Try a clear liquid diet (broth, tea, or water) for as long as directed by your caregiver. Slowly move to a bland diet as tolerated.  SEEK IMMEDIATE MEDICAL CARE IF:   The pain does not go away.   You have a fever.   You keep throwing up (vomiting).   The pain is felt only in portions of the abdomen. Pain in the right side could possibly be appendicitis. In an adult, pain in the  left lower portion of the abdomen could be colitis or diverticulitis.   You pass bloody or black tarry stools.  MAKE SURE YOU:   Understand these instructions.   Will watch your condition.   Will get help right away if you are not doing well or get worse.  Document Released: 04/22/2005 Document Revised: 07/02/2011 Document Reviewed: 02/29/2008 Exit[employer] Patient Information 2012 [employer]. 

## 2011-11-13 NOTE — Assessment & Plan Note (Signed)
Her BP is well controlled, I will check her lytes and renal function 

## 2011-11-13 NOTE — Assessment & Plan Note (Signed)
I think she needs to have a sleep study done

## 2011-11-13 NOTE — Assessment & Plan Note (Signed)
I have asked her to start janumet-xr and welchol, today I will check her a1c and will monitor her renal function

## 2011-11-13 NOTE — Assessment & Plan Note (Signed)
Start nexium 

## 2011-11-13 NOTE — Assessment & Plan Note (Signed)
The edema is most likely due to her body habitus but I will check her labs today to look for other causes (CHF, hypothyroid, renal, low protein, etc)

## 2011-11-13 NOTE — Assessment & Plan Note (Signed)
She is doing well on vytorin 

## 2011-11-16 ENCOUNTER — Ambulatory Visit: Payer: 59 | Admitting: Internal Medicine

## 2011-12-07 ENCOUNTER — Institutional Professional Consult (permissible substitution): Payer: BC Managed Care – PPO | Admitting: Pulmonary Disease

## 2012-01-26 ENCOUNTER — Encounter: Payer: Self-pay | Admitting: Internal Medicine

## 2012-01-26 ENCOUNTER — Other Ambulatory Visit (INDEPENDENT_AMBULATORY_CARE_PROVIDER_SITE_OTHER): Payer: BC Managed Care – PPO

## 2012-01-26 ENCOUNTER — Ambulatory Visit (INDEPENDENT_AMBULATORY_CARE_PROVIDER_SITE_OTHER): Payer: BC Managed Care – PPO | Admitting: Internal Medicine

## 2012-01-26 ENCOUNTER — Ambulatory Visit (INDEPENDENT_AMBULATORY_CARE_PROVIDER_SITE_OTHER)
Admission: RE | Admit: 2012-01-26 | Discharge: 2012-01-26 | Disposition: A | Discharge status: Home / Self Care | Payer: BC Managed Care – PPO | Source: Ambulatory Visit | Attending: Internal Medicine | Admitting: Internal Medicine

## 2012-01-26 VITALS — BP 146/98 | HR 76 | Temp 98.6°F | Resp 16 | Ht 64.0 in | Wt 288.0 lb

## 2012-01-26 DIAGNOSIS — M545 Low back pain, unspecified: Secondary | ICD-10-CM

## 2012-01-26 DIAGNOSIS — E119 Type 2 diabetes mellitus without complications: Secondary | ICD-10-CM

## 2012-01-26 DIAGNOSIS — R1013 Epigastric pain: Secondary | ICD-10-CM

## 2012-01-26 DIAGNOSIS — E7800 Pure hypercholesterolemia, unspecified: Secondary | ICD-10-CM

## 2012-01-26 DIAGNOSIS — D649 Anemia, unspecified: Secondary | ICD-10-CM

## 2012-01-26 DIAGNOSIS — I10 Essential (primary) hypertension: Secondary | ICD-10-CM

## 2012-01-26 DIAGNOSIS — M79605 Pain in left leg: Secondary | ICD-10-CM

## 2012-01-26 LAB — LIPID PANEL
Cholesterol: 162 mg/dL (ref 0–200)
VLDL: 14.6 mg/dL (ref 0.0–40.0)

## 2012-01-26 LAB — COMPREHENSIVE METABOLIC PANEL
Albumin: 3.3 g/dL — ABNORMAL LOW (ref 3.5–5.2)
CO2: 28 mEq/L (ref 19–32)
GFR: 72.45 mL/min (ref >60.00)
Glucose, Bld: 295 mg/dL — ABNORMAL HIGH (ref 70–99)
Potassium: 4.3 mEq/L (ref 3.5–5.1)
Sodium: 138 mEq/L (ref 135–145)
Total Bilirubin: 0.6 mg/dL (ref 0.3–1.2)
Total Protein: 7.9 g/dL (ref 6.0–8.3)

## 2012-01-26 LAB — CBC WITH DIFFERENTIAL/PLATELET
Eosinophils Relative: 3.2 % (ref 0.0–5.0)
Lymphocytes Relative: 21.3 % (ref 12.0–46.0)
Monocytes Relative: 9.7 % (ref 3.0–12.0)
Neutrophils Relative %: 65.3 % (ref 43.0–77.0)
Platelets: 238 10*3/uL (ref 150.0–400.0)
WBC: 6.4 10*3/uL (ref 4.5–10.5)

## 2012-01-26 LAB — URINALYSIS, ROUTINE W REFLEX MICROSCOPIC
Ketones, ur: NEGATIVE
Specific Gravity, Urine: 1.02 (ref 1.000–1.030)
Urine Glucose: 250
pH: 6 (ref 5.0–8.0)

## 2012-01-26 LAB — TSH: TSH: 1.79 u[IU]/mL (ref 0.35–5.50)

## 2012-01-26 LAB — HEMOGLOBIN A1C: Hgb A1c MFr Bld: 11.7 % — ABNORMAL HIGH (ref 4.6–6.5)

## 2012-01-26 LAB — AMYLASE: Amylase: 41 U/L (ref 27–131)

## 2012-01-26 MED ORDER — OXYCODONE-ACETAMINOPHEN 7.5-325 MG PO TABS: 1.0000 | ORAL_TABLET | ORAL | Status: DC | PRN

## 2012-01-26 MED ORDER — OLMESARTAN MEDOXOMIL 40 MG PO TABS: 40.0000 mg | ORAL_TABLET | Freq: Every day | ORAL | Status: DC

## 2012-01-26 NOTE — Patient Instructions (Signed)
Back Pain, Adult Low back pain is very common. About 1 in 5 people have back pain.The cause of low back pain is rarely dangerous. The pain often gets better over time.About half of people with a sudden onset of back pain feel better in just 2 weeks. About 8 in 10 people feel better by 6 weeks.  CAUSES Some common causes of back pain include:  Strain of the muscles or ligaments supporting the spine.   Wear and tear (degeneration) of the spinal discs.   Arthritis.   Direct injury to the back.  DIAGNOSIS Most of the time, the direct cause of low back pain is not known.However, back pain can be treated effectively even when the exact cause of the pain is unknown.Answering your caregiver's questions about your overall health and symptoms is one of the most accurate ways to make sure the cause of your pain is not dangerous. If your caregiver needs more information, he or she may order lab work or imaging tests (X-rays or MRIs).However, even if imaging tests show changes in your back, this usually does not require surgery. HOME CARE INSTRUCTIONS For many people, back pain returns.Since low back pain is rarely dangerous, it is often a condition that people can learn to manageon their own.   Remain active. It is stressful on the back to sit or stand in one place. Do not sit, drive, or stand in one place for more than 30 minutes at a time. Take short walks on level surfaces as soon as pain allows.Try to increase the length of time you walk each day.   Do not stay in bed.Resting more than 1 or 2 days can delay your recovery.   Do not avoid exercise or work.Your body is made to move.It is not dangerous to be active, even though your back may hurt.Your back will likely heal faster if you return to being active before your pain is gone.   Pay attention to your body when you bend and lift. Many people have less discomfortwhen lifting if they bend their knees, keep the load close to their  bodies,and avoid twisting. Often, the most comfortable positions are those that put less stress on your recovering back.   Find a comfortable position to sleep. Use a firm mattress and lie on your side with your knees slightly bent. If you lie on your back, put a pillow under your knees.   Only take over-the-counter or prescription medicines as directed by your caregiver. Over-the-counter medicines to reduce pain and inflammation are often the most helpful.Your caregiver may prescribe muscle relaxant drugs.These medicines help dull your pain so you can more quickly return to your normal activities and healthy exercise.   Put ice on the injured area.   Put ice in a plastic bag.   Place a towel between your skin and the bag.   Leave the ice on for 15 to 20 minutes, 3 to 4 times a day for the first 2 to 3 days. After that, ice and heat may be alternated to reduce pain and spasms.   Ask your caregiver about trying back exercises and gentle massage. This may be of some benefit.   Avoid feeling anxious or stressed.Stress increases muscle tension and can worsen back pain.It is important to recognize when you are anxious or stressed and learn ways to manage it.Exercise is a great option.  SEEK MEDICAL CARE IF:  You have pain that is not relieved with rest or medicine.   You have   pain that does not improve in 1 week.   You have new symptoms.   You are generally not feeling well.  SEEK IMMEDIATE MEDICAL CARE IF:   You have pain that radiates from your back into your legs.   You develop new bowel or bladder control problems.   You have unusual weakness or numbness in your arms or legs.   You develop nausea or vomiting.   You develop abdominal pain.   You feel faint.  Document Released: 07/13/2005 Document Revised: 07/02/2011 Document Reviewed: 12/01/2010 Exit[employer] Patient Information 2012 [employer]. 

## 2012-01-26 NOTE — Progress Notes (Signed)
Subjective:    Patient ID: Lori Mclaughlin, female    DOB: 02-23-1961, 51 y.o.   MRN: [id number]  Abdominal Pain This is a recurrent problem. The current episode started more than 1 year ago. The onset quality is gradual. The problem occurs intermittently. The problem has been unchanged. The pain is located in the epigastric region. The pain is at a severity of 2/10. The pain is mild. The quality of the pain is sharp. The abdominal pain does not radiate. Associated symptoms include nausea and vomiting. Pertinent negatives include no anorexia, arthralgias, belching, constipation, diarrhea, dysuria, fever, flatus, frequency, headaches, hematochezia, hematuria, melena, myalgias or weight loss. The pain is aggravated by NSAIDs. She has tried proton pump inhibitors for the symptoms. The treatment provided mild relief.  Back Pain This is a recurrent problem. The current episode started more than 1 month ago. The problem occurs intermittently. The problem has been gradually worsening since onset. The pain is present in the lumbar spine. The quality of the pain is described as shooting and stabbing. The pain radiates to the left thigh. The pain is at a severity of 6/10. The pain is moderate. The pain is worse during the day. The symptoms are aggravated by standing. Stiffness is present all day. Associated symptoms include abdominal pain and leg pain. Pertinent negatives include no bladder incontinence, bowel incontinence, chest pain, dysuria, fever, headaches, numbness, paresis, paresthesias, pelvic pain, perianal numbness, tingling, weakness or weight loss. Risk factors include obesity and lack of exercise. She has tried NSAIDs for the symptoms. The treatment provided mild relief.      Review of Systems  Constitutional: Negative for fever, chills, weight loss, diaphoresis, activity change, appetite change, fatigue and unexpected weight change.  HENT: Negative.   Eyes: Negative.   Respiratory:  Negative for cough, chest tightness, shortness of breath, wheezing and stridor.   Cardiovascular: Negative for chest pain, palpitations and leg swelling.  Gastrointestinal: Positive for nausea, vomiting and abdominal pain. Negative for diarrhea, constipation, blood in stool, melena, hematochezia, abdominal distention, anal bleeding, rectal pain, anorexia, flatus and bowel incontinence.  Genitourinary: Negative for bladder incontinence, dysuria, urgency, frequency, hematuria, flank pain, decreased urine volume, vaginal bleeding, vaginal discharge, enuresis, difficulty urinating, genital sores, vaginal pain, menstrual problem, pelvic pain and dyspareunia.  Musculoskeletal: Positive for back pain. Negative for myalgias, joint swelling, arthralgias and gait problem.  Skin: Negative for color change, pallor, rash and wound.  Neurological: Negative for dizziness, tingling, tremors, seizures, syncope, facial asymmetry, speech difficulty, weakness, light-headedness, numbness, headaches and paresthesias.  Hematological: Negative for adenopathy. Does not bruise/bleed easily.  Psychiatric/Behavioral: Negative.        Objective:   Physical Exam  Constitutional: She is oriented to person, place, and time. She appears well-developed and well-nourished.  Non-toxic appearance. She does not have a sickly appearance. She does not appear ill. She appears distressed (some pain noted when she stands).  HENT:  Head: Normocephalic and atraumatic.  Mouth/Throat: No oropharyngeal exudate.  Eyes: Conjunctivae are normal. Right eye exhibits no discharge. Left eye exhibits no discharge. No scleral icterus.  Neck: Normal range of motion. Neck supple. No JVD present. No tracheal deviation present. No thyromegaly present.  Cardiovascular: Normal rate, regular rhythm, normal heart sounds and intact distal pulses.  Exam reveals no gallop and no friction rub.   No murmur heard. Pulmonary/Chest: Effort normal and breath sounds  normal. No stridor. No respiratory distress. She has no wheezes. She has no rales. She exhibits no tenderness.  Abdominal: Soft.  Bowel sounds are normal. She exhibits no distension and no mass. There is no tenderness. There is no rebound and no guarding.  Musculoskeletal: Normal range of motion. She exhibits no edema and no tenderness.       Lumbar back: She exhibits tenderness and bony tenderness. She exhibits normal range of motion, no swelling, no edema, no deformity, no laceration, no pain, no spasm and normal pulse.  Lymphadenopathy:    She has no cervical adenopathy.  Neurological: She is alert and oriented to person, place, and time. She has normal strength. She displays no atrophy, no tremor and normal reflexes. No cranial nerve deficit or sensory deficit. She exhibits normal muscle tone. She displays a negative Romberg sign. She displays no seizure activity. Coordination and gait normal. She displays no Babinski's sign on the right side. She displays no Babinski's sign on the left side.  Reflex Scores:      Tricep reflexes are 1+ on the right side and 1+ on the left side.      Bicep reflexes are 1+ on the right side.      Brachioradialis reflexes are 1+ on the right side.      Patellar reflexes are 1+ on the right side and 1+ on the left side.      Achilles reflexes are 1+ on the right side and 1+ on the left side.      - SLR in BLE  Skin: Skin is warm and dry. No rash noted. She is not diaphoretic. No erythema. No pallor.  Psychiatric: She has a normal mood and affect. Her behavior is normal. Judgment and thought content normal.          Assessment & Plan:

## 2012-01-27 ENCOUNTER — Encounter: Payer: Self-pay | Admitting: Internal Medicine

## 2012-01-29 MED ORDER — EZETIMIBE-SIMVASTATIN 10-20 MG PO TABS: 1.0000 | ORAL_TABLET | Freq: Every day | ORAL | Status: DC

## 2012-01-29 NOTE — Assessment & Plan Note (Signed)
She has had recurrent pain for one year, I think this is GERD, I have asked her to be more compliant with her nexium therapy. I have ordered an u/s to look for gallstones and today I will check her labs to look for hepatitis, pancreatitis, renal stones, anemia, etc

## 2012-01-29 NOTE — Assessment & Plan Note (Signed)
I will check her a1c today to see if she is well controlled. I will also monitor her renal function.

## 2012-01-29 NOTE — Assessment & Plan Note (Signed)
I will recheck her CBC and see if this is worsening

## 2012-01-29 NOTE — Assessment & Plan Note (Signed)
Her BP has not been well controlled so I have asked her to restart Benicar

## 2012-01-29 NOTE — Assessment & Plan Note (Signed)
She will continue taking vytorin

## 2012-01-29 NOTE — Assessment & Plan Note (Signed)
She will try percocet for pain and I will check her plain films today, she may need to have an MRI done and/or referred to pain management

## 2012-02-22 ENCOUNTER — Other Ambulatory Visit: Payer: BC Managed Care – PPO

## 2012-02-29 ENCOUNTER — Other Ambulatory Visit: Payer: Self-pay | Admitting: *Deleted

## 2012-02-29 NOTE — Telephone Encounter (Signed)
She needs to be seen.

## 2012-02-29 NOTE — Telephone Encounter (Signed)
Patient daughter called stated mom lost all prescription including pain medication Rx at last OV 01/26/2012. Requesting to get Rx for pain med. Lower back is hurting again.

## 2012-03-01 NOTE — Telephone Encounter (Signed)
LEFT MESSAGE ON PHONE #GIVEN TO CALL DAUGHTER BACK FOR PATIENT. INFORMED CAN NOT REFILL MEDICATION Rx FROM LAST OV FOR BACK PAIN. WILL NEED TO CALL OFFICE AND MAKE APPT. FOR THIS

## 2012-03-04 ENCOUNTER — Encounter: Payer: Self-pay | Admitting: Internal Medicine

## 2012-03-04 ENCOUNTER — Ambulatory Visit (INDEPENDENT_AMBULATORY_CARE_PROVIDER_SITE_OTHER): Payer: BC Managed Care – PPO | Admitting: Internal Medicine

## 2012-03-04 VITALS — BP 150/80 | HR 76 | Temp 97.9°F | Resp 18 | Wt 291.8 lb

## 2012-03-04 DIAGNOSIS — M545 Low back pain, unspecified: Secondary | ICD-10-CM

## 2012-03-04 DIAGNOSIS — E7800 Pure hypercholesterolemia, unspecified: Secondary | ICD-10-CM

## 2012-03-04 DIAGNOSIS — E119 Type 2 diabetes mellitus without complications: Secondary | ICD-10-CM

## 2012-03-04 DIAGNOSIS — I10 Essential (primary) hypertension: Secondary | ICD-10-CM

## 2012-03-04 DIAGNOSIS — M79605 Pain in left leg: Secondary | ICD-10-CM

## 2012-03-04 MED ORDER — OLMESARTAN MEDOXOMIL-HCTZ 40-12.5 MG PO TABS: 1.0000 | ORAL_TABLET | Freq: Every day | ORAL | Status: DC

## 2012-03-04 MED ORDER — CANAGLIFLOZIN 300 MG PO TABS: 1.0000 | ORAL_TABLET | Freq: Every day | ORAL | Status: DC

## 2012-03-04 MED ORDER — OXYCODONE-ACETAMINOPHEN 7.5-325 MG PO TABS: 1.0000 | ORAL_TABLET | ORAL | Status: AC | PRN

## 2012-03-04 NOTE — Assessment & Plan Note (Signed)
She has LBP that radiates into her LLE with an abnormal plain xray and abnormal neuro exam so I have ordered an MRI to see if she has spinal stenosis, HNP with impingement, tumor, etc. She will cont percocet and motrin for pain, I have asked her to see pain management as well

## 2012-03-04 NOTE — Assessment & Plan Note (Signed)
Her blood sugars are not well controlled so I have added Invokana to [employer]

## 2012-03-04 NOTE — Assessment & Plan Note (Signed)
Her BP is not well controlled so I have changed her med to [facility]

## 2012-03-04 NOTE — Assessment & Plan Note (Signed)
Her LDL is at goal and she is doing well on vytorin

## 2012-03-04 NOTE — Patient Instructions (Signed)

## 2012-03-04 NOTE — Progress Notes (Signed)
Subjective:    Patient ID: Lori Mclaughlin, female    DOB: 1961/04/18, 51 y.o.   MRN: [id number]  Back Pain This is a recurrent problem. The current episode started more than 1 month ago. The problem occurs daily. The problem has been gradually worsening since onset. The pain is present in the lumbar spine. The quality of the pain is described as stabbing and shooting. The pain radiates to the left foot. The pain is at a severity of 6/10. The pain is moderate. The pain is the same all the time. The symptoms are aggravated by bending, standing and sitting. Stiffness is present all day. Associated symptoms include leg pain (left), numbness (left leg), paresthesias (left leg), tingling (left leg) and weakness (left leg). Pertinent negatives include no abdominal pain, bladder incontinence, bowel incontinence, chest pain, dysuria, fever, headaches, paresis, pelvic pain, perianal numbness or weight loss. Risk factors include obesity and lack of exercise. She has tried analgesics and NSAIDs for the symptoms. The treatment provided moderate relief.  Diabetes She presents for her follow-up diabetic visit. She has type 2 diabetes mellitus. Her disease course has been worsening. There are no hypoglycemic associated symptoms. Pertinent negatives for hypoglycemia include no dizziness, headaches, seizures, speech difficulty or tremors. Associated symptoms include polydipsia, polyphagia, polyuria and weakness (left leg). Pertinent negatives for diabetes include no blurred vision, no chest pain, no fatigue, no foot paresthesias, no foot ulcerations, no visual change and no weight loss. There are no hypoglycemic complications. There are no diabetic complications. Current diabetic treatment includes oral agent (dual therapy). She is compliant with treatment all of the time. Her weight is increasing steadily. She is following a generally healthy diet. Meal planning includes avoidance of concentrated sweets. She never  participates in exercise. Her home blood glucose trend is increasing steadily. An ACE inhibitor/angiotensin II receptor blocker is being taken. She does not see a podiatrist.Eye exam is current.      Review of Systems  Constitutional: Negative for fever, weight loss and fatigue.  HENT: Negative.   Eyes: Negative.  Negative for blurred vision.  Respiratory: Negative.  Negative for cough, chest tightness, shortness of breath, wheezing and stridor.   Cardiovascular: Negative for chest pain and leg swelling.  Gastrointestinal: Negative.  Negative for abdominal pain and bowel incontinence.  Genitourinary: Positive for polyuria and frequency. Negative for bladder incontinence, dysuria, urgency, hematuria, flank pain, decreased urine volume, vaginal bleeding, vaginal discharge, enuresis, difficulty urinating, genital sores, vaginal pain, menstrual problem and pelvic pain.  Musculoskeletal: Positive for back pain. Negative for myalgias, joint swelling, arthralgias and gait problem.  Skin: Negative.   Neurological: Positive for tingling (left leg), weakness (left leg), numbness (left leg) and paresthesias (left leg). Negative for dizziness, tremors, seizures, syncope, facial asymmetry, speech difficulty, light-headedness and headaches.  Hematological: Positive for polydipsia and polyphagia. Negative for adenopathy. Does not bruise/bleed easily.  Psychiatric/Behavioral: Negative.        Objective:   Physical Exam  Vitals reviewed. Constitutional: She is oriented to person, place, and time. She appears well-developed and well-nourished. No distress.  HENT:  Head: Normocephalic and atraumatic.  Mouth/Throat: Oropharynx is clear and moist. No oropharyngeal exudate.  Eyes: Conjunctivae are normal. Right eye exhibits no discharge. Left eye exhibits no discharge. No scleral icterus.  Neck: Normal range of motion. Neck supple. No JVD present. No tracheal deviation present. No thyromegaly present.    Cardiovascular: Normal rate, regular rhythm, normal heart sounds and intact distal pulses.  Exam reveals no gallop and no  friction rub.   No murmur heard. Pulmonary/Chest: Effort normal and breath sounds normal. No stridor. No respiratory distress. She has no wheezes. She has no rales. She exhibits no tenderness.  Abdominal: Soft. Bowel sounds are normal. She exhibits no distension and no mass. There is no tenderness. There is no rebound and no guarding.  Musculoskeletal: Normal range of motion. She exhibits no edema and no tenderness.  Lymphadenopathy:    She has no cervical adenopathy.  Neurological: She is alert and oriented to person, place, and time. She displays abnormal reflex. She displays no atrophy and no tremor. No cranial nerve deficit or sensory deficit. She exhibits abnormal muscle tone (weakness in LLE). She displays no seizure activity. Coordination and gait normal. She displays no Babinski's sign on the right side. She displays no Babinski's sign on the left side.  Reflex Scores:      Tricep reflexes are 1+ on the right side and 1+ on the left side.      Bicep reflexes are 1+ on the right side and 1+ on the left side.      Brachioradialis reflexes are 1+ on the right side and 1+ on the left side.      Patellar reflexes are 1+ on the right side and 0 on the left side.      Achilles reflexes are 1+ on the right side and 0 on the left side.      + SLR in LLE, - SLR in RLE  Skin: Skin is warm and dry. No rash noted. She is not diaphoretic. No erythema. No pallor.  Psychiatric: She has a normal mood and affect. Her behavior is normal. Judgment and thought content normal.     Lab Results  Component Value Date   WBC 6.4 01/26/2012   HGB 10.6* 01/26/2012   HCT 34.2* 01/26/2012   PLT 238.0 01/26/2012   GLUCOSE 295* 01/26/2012   CHOL 162 01/26/2012   TRIG 73.0 01/26/2012   HDL 54.20 01/26/2012   LDLCALC 93 01/26/2012   ALT 13 01/26/2012   AST 16 01/26/2012   NA 138 01/26/2012   K 4.3 01/26/2012    CL 102 01/26/2012   CREATININE 0.9 01/26/2012   BUN 14 01/26/2012   CO2 28 01/26/2012   TSH 1.79 01/26/2012   HGBA1C 11.7* 01/26/2012   MICROALBUR 4.6* 08/05/2009   Dg Lumbar Spine Complete  01/26/2012  *RADIOLOGY REPORT*  Clinical Data: Low back pain  LUMBAR SPINE - COMPLETE 4+ VIEW  Comparison: None.  Findings: Five lumbar-type vertebral bodies.  Normal lumbar lordosis.  No evidence of fracture or dislocation.  Vertebral body heights are maintained.  Mild multilevel degenerative changes of the visualized thoracolumbar spine.  Presumed calcified phleboliths overlying the pelvis.  IMPRESSION: No fracture or dislocation is seen.  Mild multilevel degenerative changes.  Original Report Authenticated By: Charline Bills, M.D.     Assessment & Plan:

## 2012-03-07 ENCOUNTER — Ambulatory Visit (HOSPITAL_COMMUNITY)
Admission: RE | Admit: 2012-03-07 | Discharge: 2012-03-07 | Disposition: A | Discharge status: Home / Self Care | Payer: BC Managed Care – PPO | Source: Ambulatory Visit | Attending: Internal Medicine | Admitting: Internal Medicine

## 2012-03-07 DIAGNOSIS — R209 Unspecified disturbances of skin sensation: Secondary | ICD-10-CM | POA: Insufficient documentation

## 2012-03-07 DIAGNOSIS — M545 Low back pain, unspecified: Secondary | ICD-10-CM | POA: Insufficient documentation

## 2012-03-10 ENCOUNTER — Inpatient Hospital Stay (HOSPITAL_COMMUNITY): Admission: RE | Admit: 2012-03-10 | Payer: BC Managed Care – PPO | Source: Ambulatory Visit

## 2012-03-21 ENCOUNTER — Telehealth: Payer: Self-pay | Admitting: Internal Medicine

## 2012-03-21 NOTE — Telephone Encounter (Signed)
Caller: [name]/Patient; Patient Name: Lori Mclaughlin; PCP: Sanda Linger (Adults only); Best Callback Phone Number: [phone]; Reason for call:  Pt reports blood sugar is 183.  Pt report chest tightness, abdominal pain , headache.  Pt states I feel awful.  Pt states that her blood sugar was 283 earlier today. Emergent symptom of 'New or worsening signs and symptoms that may indicate shock' identified in the Diabetes:  Control Problems Protocol.  Pt advised to hang up and CALL 911.  Pt verbalized understanding

## 2012-05-05 ENCOUNTER — Ambulatory Visit: Payer: BC Managed Care – PPO | Admitting: Internal Medicine

## 2012-05-09 ENCOUNTER — Telehealth: Payer: Self-pay | Admitting: Internal Medicine

## 2012-05-09 DIAGNOSIS — E119 Type 2 diabetes mellitus without complications: Secondary | ICD-10-CM

## 2012-05-09 MED ORDER — CANAGLIFLOZIN 300 MG PO TABS: 1.0000 | ORAL_TABLET | Freq: Every day | ORAL | Status: DC

## 2012-05-09 MED ORDER — SITAGLIP PHOS-METFORMIN HCL ER 50-1000 MG PO TB24: 1.0000 | ORAL_TABLET | Freq: Two times a day (BID) | ORAL | Status: DC

## 2012-05-09 NOTE — Telephone Encounter (Signed)
Rx sent to [facility], pt's daughter informed.

## 2012-05-09 NOTE — Telephone Encounter (Signed)
Caller: [name]/Dia; Patient Name: Lori Mclaughlin; PCP: Sanda Linger (Adults only); Best Callback Phone Number: [phone] Calling to request refill on Diabetes mediations: Invokana and Metformin. She is completely out of meds. Last seen in the office for Diabetes Check on 01/26/12. Medication Questions Protocol.

## 2012-05-10 ENCOUNTER — Telehealth: Payer: Self-pay | Admitting: Internal Medicine

## 2012-05-10 DIAGNOSIS — E119 Type 2 diabetes mellitus without complications: Secondary | ICD-10-CM

## 2012-05-10 NOTE — Telephone Encounter (Signed)
Caller: Lastasia/Other; Patient Name: Lori Mclaughlin; PCP: Oliver Barre (Adults only); Best Callback Phone Number: [phone].  Caller states she called office to request Rx refill on 05/09/12.  Now she asks if there are any samples of medications (Invokana and Metformin)  that can be obtained from the office to last until she gets paid on 10/18.  She also asks if she can get hard copy of Rx so she can shop around for the best price on her medications  Thank you.  Pharm[facility] she uses [facility] [address]; [facility].   Information noted and sent to office for follow up per Medication Questions protocol.

## 2012-05-11 MED ORDER — CANAGLIFLOZIN 300 MG PO TABS: 1.0000 | ORAL_TABLET | Freq: Every day | ORAL | Status: DC

## 2012-05-11 MED ORDER — SITAGLIP PHOS-METFORMIN HCL ER 50-1000 MG PO TB24: 1.0000 | ORAL_TABLET | Freq: Two times a day (BID) | ORAL | Status: DC

## 2012-05-11 NOTE — Telephone Encounter (Signed)
Samples are on your desk

## 2012-05-11 NOTE — Telephone Encounter (Signed)
Hard copy of Rx printed, unsure of Invokana samples, please advise Thanks

## 2012-05-11 NOTE — Telephone Encounter (Signed)
Patient daughter notified

## 2012-05-24 ENCOUNTER — Telehealth: Payer: Self-pay | Admitting: Internal Medicine

## 2012-05-24 NOTE — Telephone Encounter (Signed)
Caller: [name]/Patient; Patient Name: Lori Mclaughlin; PCP: Sanda Linger (Adults only); Best Callback Phone Number: [phone].  Has been off her diabetes medication for two weeks or so, as her new insurance carrier will not cover it.  BP 226/115 05/23/12 at [facility] 05/23/12, sent by employer because her "eyes got bloodshot."  Sent home with her BP 226/115.  Has not checked her BP today.  States BS 261.  Has had samples of Invokana and benicar, but cannot afford her prescriptions monthly.  Would like to know if there is a reasonable, affordable substitute medication she can take.  Triaged for hypertension; advised appt within 4 hours.  Appt offered 05/24/12; states is working in [facility] and has no transportation to come to the office today.  Info to office for provider review/Rx change/callback.  May reach patient at [phone].

## 2012-05-25 NOTE — Telephone Encounter (Signed)
OV w/Dr Yetta Barre or any avail MD today pls Thx

## 2012-05-25 NOTE — Telephone Encounter (Signed)
8:15 OCT 31 with Nicki Reaper.  Refused today with Dr. Yetta Barre and Magdalene Molly PM with Dr. Yetta Barre.

## 2012-05-26 ENCOUNTER — Ambulatory Visit: Payer: BC Managed Care – PPO | Admitting: Internal Medicine

## 2012-05-27 ENCOUNTER — Encounter: Payer: Self-pay | Admitting: Internal Medicine

## 2012-05-27 ENCOUNTER — Ambulatory Visit (INDEPENDENT_AMBULATORY_CARE_PROVIDER_SITE_OTHER): Payer: BC Managed Care – PPO | Admitting: Internal Medicine

## 2012-05-27 ENCOUNTER — Other Ambulatory Visit (INDEPENDENT_AMBULATORY_CARE_PROVIDER_SITE_OTHER): Payer: BC Managed Care – PPO

## 2012-05-27 VITALS — BP 170/102 | HR 93 | Temp 98.1°F | Resp 16 | Wt 274.5 lb

## 2012-05-27 DIAGNOSIS — I10 Essential (primary) hypertension: Secondary | ICD-10-CM

## 2012-05-27 DIAGNOSIS — IMO0001 Reserved for inherently not codable concepts without codable children: Secondary | ICD-10-CM

## 2012-05-27 LAB — BASIC METABOLIC PANEL WITH GFR
BUN: 21 mg/dL (ref 6–23)
CO2: 28 meq/L (ref 19–32)
Calcium: 9.2 mg/dL (ref 8.4–10.5)
Chloride: 98 meq/L (ref 96–112)
Creatinine, Ser: 1.2 mg/dL (ref 0.4–1.2)
GFR: 48.98 mL/min — ABNORMAL LOW (ref >60.00)
Glucose, Bld: 392 mg/dL — ABNORMAL HIGH (ref 70–99)
Potassium: 4.4 meq/L (ref 3.5–5.1)
Sodium: 135 meq/L (ref 135–145)

## 2012-05-27 LAB — HEMOGLOBIN A1C: Hgb A1c MFr Bld: 11.9 % — ABNORMAL HIGH (ref 4.6–6.5)

## 2012-05-27 LAB — GLUCOSE, POCT (MANUAL RESULT ENTRY): POC Glucose: 423 mg/dl — AB (ref 70–99)

## 2012-05-27 MED ORDER — SITAGLIPTIN-METFORMIN HCL 50-1000 MG PO TABS
1.0000 | ORAL_TABLET | Freq: Two times a day (BID) | ORAL | Status: DC
Start: 2012-05-27 — End: 2014-09-12

## 2012-05-27 MED ORDER — TRIAMTERENE-HCTZ 37.5-25 MG PO TABS: 1.0000 | ORAL_TABLET | Freq: Every day | ORAL | Status: DC

## 2012-05-27 MED ORDER — GLUCOSE BLOOD VI STRP: ORAL_STRIP | Status: DC

## 2012-05-27 MED ORDER — AMLODIPINE BESYLATE-VALSARTAN 5-320 MG PO TABS: 1.0000 | ORAL_TABLET | Freq: Every day | ORAL | Status: DC

## 2012-05-27 MED ORDER — BAYER CONTOUR NEXT EZ W/DEVICE KIT: 1.0000 | PACK | Freq: Two times a day (BID) | Status: DC

## 2012-05-27 MED ORDER — INSULIN DETEMIR 100 UNIT/ML ~~LOC~~ SOLN: 50.0000 [IU] | Freq: Every day | SUBCUTANEOUS | Status: DC

## 2012-05-27 MED ORDER — INSULIN PEN NEEDLE 32G X 6 MM MISC: 1.0000 | Freq: Every day | Status: DC

## 2012-05-27 NOTE — Assessment & Plan Note (Signed)
Her BP is not well controlled, I have changed her meds to maxzide and exforge for better control, I will check her lytes and renal function today

## 2012-05-27 NOTE — Assessment & Plan Note (Signed)
Her blood sugars are not well controlled, I have changed her janumet and gave her a co-pay card so that she can better afford it, I have asked her to start levemir (she was given her first dose in the office today), I will check her a1c and will monitor her renal function

## 2012-05-27 NOTE — Patient Instructions (Signed)
Diabetes, Type 2 Diabetes is a long-lasting (chronic) disease. In type 2 diabetes, the pancreas does not make enough insulin (a hormone), and the body does not respond normally to the insulin that is made. This type of diabetes was also previously called adult-onset diabetes. It usually occurs after the age of 40, but it can occur at any age.  CAUSES  Type 2 diabetes happens because the pancreasis not making enough insulin or your body has trouble using the insulin that your pancreas does make properly. SYMPTOMS   Drinking more than usual.  Urinating more than usual.  Blurred vision.  Dry, itchy skin.  Frequent infections.  Feeling more tired than usual (fatigue). DIAGNOSIS The diagnosis of type 2 diabetes is usually made by one of the following tests:  Fasting blood glucose test. You will not eat for at least 8 hours and then take a blood test.  Random blood glucose test. Your blood glucose (sugar) is checked at any time of the day regardless of when you ate.  Oral glucose tolerance test (OGTT). Your blood glucose is measured after you have not eaten (fasted) and then after you drink a glucose containing beverage. TREATMENT   Healthy eating.  Exercise.  Medicine, if needed.  Monitoring blood glucose.  Seeing your caregiver regularly. HOME CARE INSTRUCTIONS   Check your blood glucose at least once a day. More frequent monitoring may be necessary, depending on your medicines and on how well your diabetes is controlled. Your caregiver will advise you.  Take your medicine as directed by your caregiver.  Do not smoke.  Make wise food choices. Ask your caregiver for information. Weight loss can improve your diabetes.  Learn about low blood glucose (hypoglycemia) and how to treat it.  Get your eyes checked regularly.  Have a yearly physical exam. Have your blood pressure checked and your blood and urine tested.  Wear a pendant or bracelet saying that you have  diabetes.  Check your feet every night for cuts, sores, blisters, and redness. Let your caregiver know if you have any problems. SEEK MEDICAL CARE IF:   You have problems keeping your blood glucose in target range.  You have problems with your medicines.  You have symptoms of an illness that do not improve after 24 hours.  You have a sore or wound that is not healing.  You notice a change in vision or a new problem with your vision.  You have a fever. MAKE SURE YOU:  Understand these instructions.  Will watch your condition.  Will get help right away if you are not doing well or get worse. Document Released: 07/13/2005 Document Revised: 10/05/2011 Document Reviewed: 12/29/2010 [employer] Patient Information 2013 [employer]. Hypertension As your heart beats, it forces blood through your arteries. This force is your blood pressure. If the pressure is too high, it is called hypertension (HTN) or high blood pressure. HTN is dangerous because you may have it and not know it. High blood pressure may mean that your heart has to work harder to pump blood. Your arteries may be narrow or stiff. The extra work puts you at risk for heart disease, stroke, and other problems.  Blood pressure consists of two numbers, a higher number over a lower, 110/72, for example. It is stated as "110 over 72." The ideal is below 120 for the top number (systolic) and under 80 for the bottom (diastolic). Write down your blood pressure today. You should pay close attention to your blood pressure if   you have certain conditions such as:  Heart failure.  Prior heart attack.  Diabetes  Chronic kidney disease.  Prior stroke.  Multiple risk factors for heart disease. To see if you have HTN, your blood pressure should be measured while you are seated with your arm held at the level of the heart. It should be measured at least twice. A one-time elevated blood pressure reading (especially in the Emergency  Department) does not mean that you need treatment. There may be conditions in which the blood pressure is different between your right and left arms. It is important to see your caregiver soon for a recheck. Most people have essential hypertension which means that there is not a specific cause. This type of high blood pressure may be lowered by changing lifestyle factors such as:  Stress.  Smoking.  Lack of exercise.  Excessive weight.  Drug/tobacco/alcohol use.  Eating less salt. Most people do not have symptoms from high blood pressure until it has caused damage to the body. Effective treatment can often prevent, delay or reduce that damage. TREATMENT  When a cause has been identified, treatment for high blood pressure is directed at the cause. There are a large number of medications to treat HTN. These fall into several categories, and your caregiver will help you select the medicines that are best for you. Medications may have side effects. You should review side effects with your caregiver. If your blood pressure stays high after you have made lifestyle changes or started on medicines,   Your medication(s) may need to be changed.  Other problems may need to be addressed.  Be certain you understand your prescriptions, and know how and when to take your medicine.  Be sure to follow up with your caregiver within the time frame advised (usually within two weeks) to have your blood pressure rechecked and to review your medications.  If you are taking more than one medicine to lower your blood pressure, make sure you know how and at what times they should be taken. Taking two medicines at the same time can result in blood pressure that is too low. SEEK IMMEDIATE MEDICAL CARE IF:  You develop a severe headache, blurred or changing vision, or confusion.  You have unusual weakness or numbness, or a faint feeling.  You have severe chest or abdominal pain, vomiting, or breathing  problems. MAKE SURE YOU:   Understand these instructions.  Will watch your condition.  Will get help right away if you are not doing well or get worse. Document Released: 07/13/2005 Document Revised: 10/05/2011 Document Reviewed: 03/02/2008 [employer] Patient Information 2013 [employer].  

## 2012-05-27 NOTE — Progress Notes (Signed)
Subjective:    Patient ID: Lori Mclaughlin, female    DOB: Jul 03, 1961, 51 y.o.   MRN: [id number]  Hypertension This is a chronic problem. The current episode started more than 1 year ago. Pertinent negatives include no anxiety, blurred vision, chest pain, headaches, malaise/fatigue, neck pain, orthopnea, palpitations, peripheral edema, PND, shortness of breath or sweats. Past treatments include diuretics and angiotensin blockers. The current treatment provides mild improvement. Compliance problems include exercise, diet and medication cost.  Identifiable causes of hypertension include sleep apnea.  Diabetes She presents for her follow-up diabetic visit. She has type 2 diabetes mellitus. Her disease course has been worsening. There are no hypoglycemic associated symptoms. Pertinent negatives for hypoglycemia include no dizziness, headaches or sweats. Associated symptoms include polydipsia, polyphagia and polyuria. Pertinent negatives for diabetes include no blurred vision, no chest pain, no fatigue, no foot paresthesias, no foot ulcerations, no visual change, no weakness and no weight loss. There are no hypoglycemic complications. There are no diabetic complications. Current diabetic treatment includes oral agent (triple therapy). She is compliant with treatment some of the time. Her weight is stable. She is following a generally unhealthy diet. When asked about meal planning, she reported none. She has not had a previous visit with a dietician. She never participates in exercise. Her home blood glucose trend is increasing steadily. Her breakfast blood glucose range is generally >200 mg/dl. Her lunch blood glucose range is generally >200 mg/dl. Her dinner blood glucose range is generally >200 mg/dl. Her highest blood glucose is >200 mg/dl. Her overall blood glucose range is >200 mg/dl. An ACE inhibitor/angiotensin II receptor blocker is being taken. She does not see a podiatrist.Eye exam is current.       Review of Systems  Constitutional: Negative for fever, chills, weight loss, malaise/fatigue, diaphoresis, activity change, appetite change, fatigue and unexpected weight change.  HENT: Negative.  Negative for neck pain.   Eyes: Negative.  Negative for blurred vision.  Respiratory: Positive for apnea. Negative for cough, chest tightness, shortness of breath, wheezing and stridor.   Cardiovascular: Negative for chest pain, palpitations, orthopnea, leg swelling and PND.  Gastrointestinal: Negative.   Genitourinary: Positive for polyuria.  Musculoskeletal: Negative.   Skin: Negative.   Neurological: Negative.  Negative for dizziness, weakness, light-headedness and headaches.  Hematological: Positive for polydipsia and polyphagia. Negative for adenopathy. Does not bruise/bleed easily.  Psychiatric/Behavioral: Negative.        Objective:   Physical Exam  Vitals reviewed. Constitutional: She is oriented to person, place, and time. She appears well-developed and well-nourished. No distress.  HENT:  Head: Normocephalic and atraumatic.  Mouth/Throat: Oropharynx is clear and moist. No oropharyngeal exudate.  Eyes: Conjunctivae normal are normal. Right eye exhibits no discharge. Left eye exhibits no discharge. No scleral icterus.  Neck: Normal range of motion. Neck supple. No JVD present. No tracheal deviation present. No thyromegaly present.  Cardiovascular: Normal rate, regular rhythm, normal heart sounds and intact distal pulses.  Exam reveals no gallop and no friction rub.   No murmur heard. Pulmonary/Chest: Effort normal and breath sounds normal. No stridor. No respiratory distress. She has no wheezes. She has no rales. She exhibits no tenderness.  Abdominal: Soft. Bowel sounds are normal. She exhibits no distension and no mass. There is no tenderness. There is no rebound and no guarding.  Musculoskeletal: Normal range of motion. She exhibits no edema and no tenderness.   Lymphadenopathy:    She has no cervical adenopathy.  Neurological: She is oriented to  person, place, and time.  Skin: Skin is warm and dry. No rash noted. She is not diaphoretic. No erythema. No pallor.  Psychiatric: Her behavior is normal. Judgment and thought content normal.      Lab Results  Component Value Date   WBC 6.4 01/26/2012   HGB 10.6* 01/26/2012   HCT 34.2* 01/26/2012   PLT 238.0 01/26/2012   GLUCOSE 295* 01/26/2012   CHOL 162 01/26/2012   TRIG 73.0 01/26/2012   HDL 54.20 01/26/2012   LDLCALC 93 01/26/2012   ALT 13 01/26/2012   AST 16 01/26/2012   NA 138 01/26/2012   K 4.3 01/26/2012   CL 102 01/26/2012   CREATININE 0.9 01/26/2012   BUN 14 01/26/2012   CO2 28 01/26/2012   TSH 1.79 01/26/2012   HGBA1C 11.7* 01/26/2012   MICROALBUR 4.6* 08/05/2009      Assessment & Plan:

## 2012-07-07 ENCOUNTER — Ambulatory Visit: Payer: BC Managed Care – PPO | Admitting: *Deleted

## 2012-12-29 ENCOUNTER — Telehealth: Payer: Self-pay | Admitting: Internal Medicine

## 2012-12-29 NOTE — Telephone Encounter (Signed)
Pt called req sample for Levemir 100 unit, Janumet and Benicar 40mg . Pt also stated that the doctor gave her a form to fill out so she get help with her medication due to the limited income. Pt stated that she never heard anything about it and was wondering if she can get another form to fill out again. Please follow with pt.

## 2012-12-30 NOTE — Telephone Encounter (Signed)
Pt called back to check on sample req for Levemir 100 unit, Janumet and Benicar 40mg . Pt is going to be out of this med, please call pt back if we have any sample. Please follow up with the paper work as well, but right now just wonder if doctor have any of those sample for pickup.

## 2012-12-30 NOTE — Telephone Encounter (Signed)
Samples of janumet 50-1000 and exforge available.  Only samples of levemir vials available.Pt does not have benicar listed on current medication list. Also provide copay savings cards since pt has commercial insurance. Called pt no answer

## 2012-12-30 NOTE — Telephone Encounter (Signed)
Will check samples and forward messg to check status of pt asst.

## 2013-01-03 NOTE — Telephone Encounter (Signed)
Pt called back and was inform that the sample is up front.

## 2013-01-20 ENCOUNTER — Ambulatory Visit: Payer: BC Managed Care – PPO | Admitting: Internal Medicine

## 2013-01-20 DIAGNOSIS — Z0289 Encounter for other administrative examinations: Secondary | ICD-10-CM

## 2013-06-08 LAB — HM DIABETES EYE EXAM

## 2013-06-21 ENCOUNTER — Ambulatory Visit: Payer: BC Managed Care – PPO | Admitting: Internal Medicine

## 2013-06-21 DIAGNOSIS — Z0289 Encounter for other administrative examinations: Secondary | ICD-10-CM

## 2013-10-09 ENCOUNTER — Ambulatory Visit (INDEPENDENT_AMBULATORY_CARE_PROVIDER_SITE_OTHER): Payer: PRIVATE HEALTH INSURANCE | Admitting: Internal Medicine

## 2013-10-09 ENCOUNTER — Encounter: Payer: Self-pay | Admitting: Internal Medicine

## 2013-10-09 VITALS — BP 180/110 | HR 72 | Temp 97.1°F | Resp 16 | Ht 64.0 in | Wt 260.0 lb

## 2013-10-09 DIAGNOSIS — E7800 Pure hypercholesterolemia, unspecified: Secondary | ICD-10-CM

## 2013-10-09 DIAGNOSIS — E119 Type 2 diabetes mellitus without complications: Secondary | ICD-10-CM

## 2013-10-09 DIAGNOSIS — E1165 Type 2 diabetes mellitus with hyperglycemia: Principal | ICD-10-CM

## 2013-10-09 DIAGNOSIS — L03211 Cellulitis of face: Secondary | ICD-10-CM

## 2013-10-09 DIAGNOSIS — I10 Essential (primary) hypertension: Secondary | ICD-10-CM

## 2013-10-09 DIAGNOSIS — J340 Abscess, furuncle and carbuncle of nose: Secondary | ICD-10-CM | POA: Insufficient documentation

## 2013-10-09 DIAGNOSIS — IMO0001 Reserved for inherently not codable concepts without codable children: Secondary | ICD-10-CM

## 2013-10-09 DIAGNOSIS — L0201 Cutaneous abscess of face: Secondary | ICD-10-CM

## 2013-10-09 MED ORDER — FLUCONAZOLE 150 MG PO TABS: 150.0000 mg | ORAL_TABLET | Freq: Once | ORAL | Status: DC

## 2013-10-09 MED ORDER — COLESEVELAM HCL 3.75 G PO PACK: 1.0000 | PACK | Freq: Every day | ORAL | Status: DC

## 2013-10-09 MED ORDER — SULFAMETHOXAZOLE-TRIMETHOPRIM 800-160 MG PO TABS: 1.0000 | ORAL_TABLET | Freq: Two times a day (BID) | ORAL | Status: AC

## 2013-10-09 MED ORDER — TRIAMTERENE-HCTZ 37.5-25 MG PO TABS: 1.0000 | ORAL_TABLET | Freq: Every day | ORAL | Status: DC

## 2013-10-09 MED ORDER — AMLODIPINE BESYLATE-VALSARTAN 5-320 MG PO TABS: 1.0000 | ORAL_TABLET | Freq: Every day | ORAL | Status: DC

## 2013-10-09 NOTE — Assessment & Plan Note (Signed)
She will restart vytorin and welchol Will recheck the FLP today

## 2013-10-09 NOTE — Assessment & Plan Note (Signed)
I think this is a staph infection Will treat with bactrim-ds She will take diflucan to treat the development of yeast vaginitis

## 2013-10-09 NOTE — Assessment & Plan Note (Signed)
She will restart the previous meds I will check her A1C and her renal function today

## 2013-10-09 NOTE — Progress Notes (Signed)
Pre visit review using our clinic review tool, if applicable. No additional management support is needed unless otherwise documented below in the visit note. 

## 2013-10-09 NOTE — Progress Notes (Signed)
Subjective:    Patient ID: Lori Mclaughlin, female    DOB: 1960/12/16, 53 y.o.   MRN: [id number]  HPI Comments: She has been out of all meds for one year.  Hypertension This is a chronic problem. The current episode started more than 1 year ago. The problem has been gradually worsening since onset. The problem is uncontrolled. Pertinent negatives include no anxiety, blurred vision, chest pain, headaches, malaise/fatigue, neck pain, orthopnea, palpitations, peripheral edema, PND, shortness of breath or sweats. Past treatments include nothing. The current treatment provides no improvement. Compliance problems include diet, exercise, medication cost and psychosocial issues.  Identifiable causes of hypertension include sleep apnea.      Review of Systems  Constitutional: Positive for fatigue. Negative for fever, chills, malaise/fatigue, diaphoresis, activity change, appetite change and unexpected weight change.  HENT: Negative.        She has a painful, swollen nodule on the right side of her nose for several weeks, she drained some pus out of it a few days ago and the pain and swelling diminished but the small nodule is still there  Eyes: Negative.  Negative for blurred vision.  Respiratory: Positive for apnea. Negative for cough, choking, chest tightness, shortness of breath, wheezing and stridor.   Cardiovascular: Negative.  Negative for chest pain, palpitations, orthopnea, leg swelling and PND.  Gastrointestinal: Negative.  Negative for nausea, vomiting, abdominal pain, diarrhea, constipation and blood in stool.  Endocrine: Negative.  Negative for polydipsia, polyphagia and polyuria.  Genitourinary: Negative.  Negative for dysuria, urgency, frequency, hematuria, enuresis, difficulty urinating and pelvic pain.  Musculoskeletal: Negative.  Negative for arthralgias, back pain, gait problem, joint swelling, myalgias, neck pain and neck stiffness.  Skin: Negative.   Allergic/Immunologic:  Negative.   Neurological: Negative.  Negative for dizziness and headaches.  Hematological: Negative.  Negative for adenopathy. Does not bruise/bleed easily.  Psychiatric/Behavioral: Negative.        Objective:   Physical Exam  Vitals reviewed. Constitutional: She is oriented to person, place, and time. She appears well-developed and well-nourished. No distress.  HENT:  Head: Normocephalic and atraumatic.  Nose: Nasal deformity (small putsule/nodule noted on the the right lateral wall) present. No mucosal edema or rhinorrhea. No epistaxis.  No foreign bodies. Right sinus exhibits no maxillary sinus tenderness and no frontal sinus tenderness. Left sinus exhibits no maxillary sinus tenderness and no frontal sinus tenderness.  Mouth/Throat: Oropharynx is clear and moist. No oropharyngeal exudate.  Eyes: Conjunctivae are normal. Right eye exhibits no discharge. Left eye exhibits no discharge. No scleral icterus.  Neck: Normal range of motion. Neck supple. No JVD present. No tracheal deviation present. No thyromegaly present.  Cardiovascular: Normal rate, regular rhythm, normal heart sounds and intact distal pulses.  Exam reveals no gallop and no friction rub.   No murmur heard. Pulmonary/Chest: Effort normal and breath sounds normal. No stridor. No respiratory distress. She has no wheezes. She has no rales. She exhibits no tenderness.  Abdominal: Soft. Bowel sounds are normal. She exhibits no distension and no mass. There is no tenderness. There is no rebound and no guarding.  Musculoskeletal: Normal range of motion. She exhibits no edema and no tenderness.  Lymphadenopathy:    She has no cervical adenopathy.  Neurological: She is oriented to person, place, and time.  Skin: Skin is warm and dry. No rash noted. She is not diaphoretic. No erythema. No pallor.  Psychiatric: She has a normal mood and affect. Her behavior is normal. Judgment and thought  content normal.     Lab Results  Component  Value Date   WBC 6.4 01/26/2012   HGB 10.6* 01/26/2012   HCT 34.2* 01/26/2012   PLT 238.0 01/26/2012   GLUCOSE 392* 05/27/2012   CHOL 162 01/26/2012   TRIG 73.0 01/26/2012   HDL 54.20 01/26/2012   LDLCALC 93 01/26/2012   ALT 13 01/26/2012   AST 16 01/26/2012   NA 135 05/27/2012   K 4.4 05/27/2012   CL 98 05/27/2012   CREATININE 1.2 05/27/2012   BUN 21 05/27/2012   CO2 28 05/27/2012   TSH 1.79 01/26/2012   HGBA1C 11.9 Repeated and verified X2.* 05/27/2012   MICROALBUR 4.6* 08/05/2009       Assessment & Plan:

## 2013-10-09 NOTE — Assessment & Plan Note (Signed)
Her BP is not well controlled today She will restart previous meds I will check her renal function today

## 2013-10-09 NOTE — Patient Instructions (Signed)
Cellulitis Cellulitis is an infection of the skin and the tissue beneath it. The infected area is usually red and tender. Cellulitis occurs most often in the arms and lower legs.  CAUSES  Cellulitis is caused by bacteria that enter the skin through cracks or cuts in the skin. The most common types of bacteria that cause cellulitis are Staphylococcus and Streptococcus. SYMPTOMS   Redness and warmth.  Swelling.  Tenderness or pain.  Fever. DIAGNOSIS  Your caregiver can usually determine what is wrong based on a physical exam. Blood tests may also be done. TREATMENT  Treatment usually involves taking an antibiotic medicine. HOME CARE INSTRUCTIONS   Take your antibiotics as directed. Finish them even if you start to feel better.  Keep the infected arm or leg elevated to reduce swelling.  Apply a warm cloth to the affected area up to 4 times per day to relieve pain.  Only take over-the-counter or prescription medicines for pain, discomfort, or fever as directed by your caregiver.  Keep all follow-up appointments as directed by your caregiver. SEEK MEDICAL CARE IF:   You notice red streaks coming from the infected area.  Your red area gets larger or turns dark in color.  Your bone or joint underneath the infected area becomes painful after the skin has healed.  Your infection returns in the same area or another area.  You notice a swollen bump in the infected area.  You develop new symptoms. SEEK IMMEDIATE MEDICAL CARE IF:   You have a fever.  You feel very sleepy.  You develop vomiting or diarrhea.  You have a general ill feeling (malaise) with muscle aches and pains. MAKE SURE YOU:   Understand these instructions.  Will watch your condition.  Will get help right away if you are not doing well or get worse. Document Released: 04/22/2005 Document Revised: 01/12/2012 Document Reviewed: 09/28/2011 [employer] Patient Information 2014 [employer].  

## 2013-10-10 ENCOUNTER — Telehealth: Payer: Self-pay | Admitting: Internal Medicine

## 2013-10-10 NOTE — Telephone Encounter (Signed)
Relevant patient education assigned to patient using Emmi. ° °

## 2013-10-13 ENCOUNTER — Telehealth: Payer: Self-pay

## 2013-10-13 NOTE — Telephone Encounter (Signed)
Relevant patient education assigned to patient using Emmi. ° °

## 2014-01-25 ENCOUNTER — Telehealth: Payer: Self-pay | Admitting: *Deleted

## 2014-01-25 DIAGNOSIS — IMO0001 Reserved for inherently not codable concepts without codable children: Secondary | ICD-10-CM

## 2014-01-25 DIAGNOSIS — E1165 Type 2 diabetes mellitus with hyperglycemia: Principal | ICD-10-CM

## 2014-01-25 NOTE — Telephone Encounter (Signed)
Left message on machine for patient to schedule a follow up diabetic appointment.  Labs ordered.

## 2014-03-06 ENCOUNTER — Telehealth: Payer: Self-pay

## 2014-03-06 NOTE — Telephone Encounter (Signed)
All numbers and contacts information is not valid.

## 2014-03-26 ENCOUNTER — Emergency Department (HOSPITAL_COMMUNITY)
Admission: EM | Admit: 2014-03-26 | Discharge: 2014-03-26 | Disposition: A | Discharge status: Home / Self Care | Payer: PRIVATE HEALTH INSURANCE | Attending: Emergency Medicine | Admitting: Emergency Medicine

## 2014-03-26 ENCOUNTER — Emergency Department (HOSPITAL_COMMUNITY): Payer: PRIVATE HEALTH INSURANCE

## 2014-03-26 ENCOUNTER — Encounter (HOSPITAL_COMMUNITY): Payer: Self-pay | Admitting: Emergency Medicine

## 2014-03-26 DIAGNOSIS — Z79899 Other long term (current) drug therapy: Secondary | ICD-10-CM | POA: Diagnosis not present

## 2014-03-26 DIAGNOSIS — R42 Dizziness and giddiness: Secondary | ICD-10-CM | POA: Insufficient documentation

## 2014-03-26 DIAGNOSIS — I158 Other secondary hypertension: Secondary | ICD-10-CM | POA: Insufficient documentation

## 2014-03-26 DIAGNOSIS — E119 Type 2 diabetes mellitus without complications: Secondary | ICD-10-CM | POA: Insufficient documentation

## 2014-03-26 DIAGNOSIS — R079 Chest pain, unspecified: Secondary | ICD-10-CM | POA: Insufficient documentation

## 2014-03-26 DIAGNOSIS — Z87891 Personal history of nicotine dependence: Secondary | ICD-10-CM | POA: Insufficient documentation

## 2014-03-26 DIAGNOSIS — Z862 Personal history of diseases of the blood and blood-forming organs and certain disorders involving the immune mechanism: Secondary | ICD-10-CM | POA: Insufficient documentation

## 2014-03-26 DIAGNOSIS — J45909 Unspecified asthma, uncomplicated: Secondary | ICD-10-CM | POA: Diagnosis not present

## 2014-03-26 DIAGNOSIS — R739 Hyperglycemia, unspecified: Secondary | ICD-10-CM

## 2014-03-26 DIAGNOSIS — Z794 Long term (current) use of insulin: Secondary | ICD-10-CM | POA: Diagnosis not present

## 2014-03-26 DIAGNOSIS — I159 Secondary hypertension, unspecified: Secondary | ICD-10-CM

## 2014-03-26 DIAGNOSIS — K219 Gastro-esophageal reflux disease without esophagitis: Secondary | ICD-10-CM | POA: Insufficient documentation

## 2014-03-26 DIAGNOSIS — Z91040 Latex allergy status: Secondary | ICD-10-CM | POA: Diagnosis not present

## 2014-03-26 DIAGNOSIS — Z8739 Personal history of other diseases of the musculoskeletal system and connective tissue: Secondary | ICD-10-CM | POA: Insufficient documentation

## 2014-03-26 LAB — BASIC METABOLIC PANEL
ANION GAP: 13 (ref 5–15)
BUN: 19 mg/dL (ref 6–23)
CALCIUM: 9 mg/dL (ref 8.4–10.5)
CO2: 25 mEq/L (ref 19–32)
Chloride: 97 mEq/L (ref 96–112)
Creatinine, Ser: 1 mg/dL (ref 0.50–1.10)
GFR calc Af Amer: 73 mL/min — ABNORMAL LOW (ref >90)
GFR, EST NON AFRICAN AMERICAN: 63 mL/min — AB (ref >90)
GLUCOSE: 382 mg/dL — AB (ref 70–99)
POTASSIUM: 4.2 meq/L (ref 3.7–5.3)
SODIUM: 135 meq/L — AB (ref 137–147)

## 2014-03-26 LAB — I-STAT TROPONIN, ED
TROPONIN I, POC: 0 ng/mL (ref 0.00–0.08)
Troponin i, poc: 0.01 ng/mL (ref 0.00–0.08)

## 2014-03-26 LAB — PRO B NATRIURETIC PEPTIDE: Pro B Natriuretic peptide (BNP): 571.3 pg/mL — ABNORMAL HIGH (ref 0–125)

## 2014-03-26 LAB — CBC
HCT: 35.4 % — ABNORMAL LOW (ref 36.0–46.0)
HEMOGLOBIN: 11.6 g/dL — AB (ref 12.0–15.0)
MCH: 23.8 pg — AB (ref 26.0–34.0)
MCHC: 32.8 g/dL (ref 30.0–36.0)
MCV: 72.5 fL — ABNORMAL LOW (ref 78.0–100.0)
PLATELETS: 191 10*3/uL (ref 150–400)
RBC: 4.88 MIL/uL (ref 3.87–5.11)
RDW: 15.1 % (ref 11.5–15.5)
WBC: 6.8 10*3/uL (ref 4.0–10.5)

## 2014-03-26 MED ORDER — SODIUM CHLORIDE 0.9 % IV BOLUS (SEPSIS)
1000.0000 mL | Freq: Once | INTRAVENOUS | Status: AC
  Administered 2014-03-26: 1000 mL via INTRAVENOUS

## 2014-03-26 MED ORDER — LABETALOL HCL 5 MG/ML IV SOLN
10.0000 mg | Freq: Once | INTRAVENOUS | Status: AC
  Administered 2014-03-26: 10 mg via INTRAVENOUS
  Filled 2014-03-26: qty 4

## 2014-03-26 MED ORDER — NITROGLYCERIN 0.4 MG SL SUBL
0.4000 mg | SUBLINGUAL_TABLET | SUBLINGUAL | Status: DC | PRN
  Administered 2014-03-26 (×3): 0.4 mg via SUBLINGUAL

## 2014-03-26 MED ORDER — ACETAMINOPHEN 325 MG PO TABS
650.0000 mg | ORAL_TABLET | Freq: Once | ORAL | Status: AC
  Administered 2014-03-26: 650 mg via ORAL
  Filled 2014-03-26: qty 2

## 2014-03-26 NOTE — ED Provider Notes (Signed)
CSN: [id number]     Arrival date & time 03/26/14  1339 History   First MD Initiated Contact with Patient 03/26/14 1351     Chief Complaint  Patient presents with  . Chest Pain    Patient is a 53 y.o. female presenting with chest pain. The history is provided by the patient.  Chest Pain Pain location:  Substernal area Pain quality: pressure   Radiates to: to b/l arms. Pain radiates to the back: no   Pain severity now: 10/10, now 5/10. Onset quality:  Gradual Progression:  Improving (after NTG) Chronicity:  New Context: raising an arm and at rest   Context: not breathing, not lifting (has been lifting a heavy grandson recently) and no trauma   Relieved by:  Nitroglycerin Ineffective treatments:  Leaning forward Associated symptoms: no abdominal pain, no back pain, no cough, no diaphoresis, no dizziness, no fever, no heartburn, no lower extremity edema, no nausea, no orthopnea, no shortness of breath, no syncope, not vomiting and no weakness   Risk factors: no prior DVT/PE and no smoking    EMS gave ASA 35m and 1 SL NTG, pain improved to 5/10.  No hx of similar pain.  Has been exercising a lot recently without CP or dyspnea.  No wheezing.  No family hx of early CAD.  Treated for HTN, HLD and DM per patient.  Pain was gradual in onset and has not migrated.  No hempoptysis.      Past Medical History  Diagnosis Date  . Allergic rhinitis   . Anemia   . Asthma   . Diabetes mellitus type II   . GERD (gastroesophageal reflux disease)   . Hypertension   . Osteoarthritis    Past Surgical History  Procedure Laterality Date  . Tubal ligation     Family History  Problem Relation Age of Onset  . Diabetes Other   . Hypertension Other   . Stroke Other   . Cancer Other     colon; prostate  . Stroke Mother   . Cancer Father   . Hypertension Brother   . Kidney disease Neg Hx   . Alcohol abuse Neg Hx   . Asthma Neg Hx   . COPD Neg Hx   . Depression Neg Hx   . Drug abuse Neg Hx    . Early death Neg Hx   . Hearing loss Neg Hx   . Heart disease Neg Hx   . Hyperlipidemia Neg Hx    History  Substance Use Topics  . Smoking status: Former Smoker    Quit date: 08/29/2010  . Smokeless tobacco: Never Used  . Alcohol Use: 3.0 oz/week    5 Glasses of wine per week     Comment: Wine < 1   OB History   Grav Para Term Preterm Abortions TAB SAB Ect Mult Living                 Review of Systems  Constitutional: Negative for fever and diaphoresis.  HENT: Negative for congestion.   Respiratory: Negative for cough, shortness of breath and wheezing.   Cardiovascular: Positive for chest pain. Negative for orthopnea and syncope.  Gastrointestinal: Negative for heartburn, nausea, vomiting and abdominal pain.  Musculoskeletal: Negative for back pain.  Neurological: Positive for light-headedness. Negative for dizziness, syncope and weakness.    Allergies  Latex and Lisinopril  Home Medications   Prior to Admission medications   Medication Sig Start Date End Date Taking? Authorizing Provider  albuterol (PROAIR HFA) 108 (90 BASE) MCG/ACT inhaler Inhale 2 puffs into the lungs 4 (four) times daily as needed.      Historical Provider, MD  amLODipine-valsartan (EXFORGE) 5-320 MG per tablet Take 1 tablet by mouth daily. 10/09/13   Janith Lima, MD  ammonium lactate (LAC-HYDRIN FIVE) 5 % LOTN lotion Apply topically as needed.      Historical Provider, MD  Blood Glucose Monitoring Suppl (CONTOUR NEXT EZ MONITOR) W/DEVICE KIT 1 Act by Does not apply route 2 (two) times daily. 05/27/12   Janith Lima, MD  Canagliflozin (INVOKANA) 300 MG TABS Take 1 tablet by mouth daily. 05/11/12   Janith Lima, MD  Colesevelam HCl 3.75 G PACK Take 1 packet by mouth daily. 10/09/13   Janith Lima, MD  esomeprazole (NEXIUM) 40 MG capsule Take 1 capsule (40 mg total) by mouth daily. 11/13/11 11/12/12  Janith Lima, MD  ezetimibe-simvastatin (VYTORIN) 10-20 MG per tablet Take 1 tablet by mouth at  bedtime. 01/29/12 01/28/13  Janith Lima, MD  fluconazole (DIFLUCAN) 150 MG tablet Take 1 tablet (150 mg total) by mouth once. 10/09/13   Janith Lima, MD  glucose blood (BAYER CONTOUR NEXT TEST) test strip Use BID 05/27/12   Janith Lima, MD  insulin detemir (LEVEMIR FLEXPEN) 100 UNIT/ML injection Inject 50 Units into the skin at bedtime. 05/27/12   Janith Lima, MD  Insulin Pen Needle (NOVOFINE) 32G X 6 MM MISC 1 Act by Does not apply route daily. 05/27/12   Janith Lima, MD  Lancets (FREESTYLE) lancets 1 each 2 (two) times daily as needed. Dx 250.00      Historical Provider, MD  sitaGLIPtan-metformin (JANUMET) 50-1000 MG per tablet Take 1 tablet by mouth 2 (two) times daily with a meal. 05/27/12   Janith Lima, MD  triamterene-hydrochlorothiazide (MAXZIDE-25) 37.5-25 MG per tablet Take 1 tablet by mouth daily. 10/09/13   Janith Lima, MD   BP 168/77  Pulse 79  Temp(Src) 98.3 F (36.8 C) (Oral)  Resp 18  SpO2 99%  LMP 02/23/2012 Physical Exam  Nursing note and vitals reviewed. Constitutional: She is oriented to person, place, and time. She appears well-developed and well-nourished. No distress.  HENT:  Head: Normocephalic and atraumatic.  Nose: Nose normal.  Eyes: Conjunctivae are normal.  Neck: Normal range of motion. Neck supple. No tracheal deviation present.  Cardiovascular: Normal rate, regular rhythm and normal heart sounds.   No murmur heard. Pulmonary/Chest: Effort normal and breath sounds normal. No respiratory distress. She has no rales. She exhibits tenderness (reproducible exact pain she experienced earlier).  Abdominal: Soft. Bowel sounds are normal. She exhibits no distension and no mass. There is no tenderness.  Musculoskeletal: Normal range of motion. She exhibits no edema.  No lower extremity edema, erythema or tenderness  Neurological: She is alert and oriented to person, place, and time.  Skin: Skin is warm and dry. No rash noted.  Psychiatric: She has a  normal mood and affect.    ED Course  Procedures (including critical care time) Labs Review Labs Reviewed  CBC - Abnormal; Notable for the following:    Hemoglobin 11.6 (*)    HCT 35.4 (*)    MCV 72.5 (*)    MCH 23.8 (*)    All other components within normal limits  BASIC METABOLIC PANEL - Abnormal; Notable for the following:    Sodium 135 (*)    Glucose, Bld 382 (*)    GFR  calc non Af Amer 63 (*)    GFR calc Af Amer 73 (*)    All other components within normal limits  PRO B NATRIURETIC PEPTIDE - Abnormal; Notable for the following:    Pro B Natriuretic peptide (BNP) 571.3 (*)    All other components within normal limits  I-STAT TROPOININ, ED    Imaging Review Dg Chest 2 View  03/26/2014   CLINICAL DATA:  Chest pain, hypertension, and diabetes.  EXAM: CHEST  2 VIEW  COMPARISON:  Portable chest x-ray of October 27, 2009  FINDINGS: The lungs are adequately inflated and clear. The cardiopericardial silhouette remains enlarged. The pulmonary vascularity is not engorged. The mediastinum is normal in width. The trachea is midline. There is no pleural effusion or pneumothorax. The bony thorax is unremarkable.  IMPRESSION: Stable enlargement of the cardiac silhouette without evidence of pulmonary edema or pulmonary vascular congestion.   Electronically Signed   By: David  [place]   On: 03/26/2014 15:29     EKG Interpretation   Date/Time:  Monday March 26 2014 13:45:39 EDT Ventricular Rate:  81 PR Interval:  148 QRS Duration: 88 QT Interval:  408 QTC Calculation: 474 R Axis:   -29 Text Interpretation:  Sinus rhythm Left axis deviation Nonspecific ST  abnormality Confirmed by Ashok Cordia  MD, Lennette Bihari ([id number]) on 03/26/2014 1:56:25 PM      MDM   Final diagnoses:  Chest pain, unspecified chest pain type  Hyperglycemia  Secondary hypertension, unspecified    Hx of DM, HTN, HLD who presents to ED for eval of substernal CP.  Atypical and typical features noted in hx.  No hx of angina  equivalents. Pain improved with NTG.  EKG non ischemic. Will need stress testing in near term.  Pain reproduced with moving arms and palpation, indicator of MSK source. CXR without consolidation to indicate PNA. EKG without ischemic changes. No signs of DVT, not hypoxic, not tachycardic, no hemoptysis or pleurisy, doubt PE. Wells 0. No reflux sx.  Well appearing, pain improving, onset not maximal at onset, doubt dissection.   3:53 PM initial trop neg. Hyperglycemic without acidosis.  NTG did not help CP, still 4-5/10. BP rising. Took home BP meds this AM, not due for any now.  Gave labetalol.   Filed Vitals:   03/26/14 1539  BP: 216/92  Pulse:   Temp:   Resp: 20    4:55 PM BP still elevated.  CP 2/10.  Signed out to Dr. Reather Converse.  Please see his note for further ED course. Pending delta troponin. If neg anticipate d/c home with cardiology f/u within week.    Aleysia Sours, MD 03/26/14 [phone]

## 2014-03-26 NOTE — ED Notes (Signed)
Pt arrives via EMS from patients workplace where around noon began to feel nauseated. States shortly after began having 10/10 substernal chest pain with tingling sensation all over body, felt lightheaded and SOB with the pain. Pt took  ibuprofen prior to EMS arrival. On arrival patient was rating pain 6/10. They administered 1 SL nitro and  ASA. Pain went down to 5/10. Currently 5/10. cbg 386. 20g LAC. Pt awake, alert, oriented x4, NAD at present.

## 2014-03-26 NOTE — ED Provider Notes (Signed)
5:56 PM Please refer to D. Stengel's note for full details of care. Pt with atypical chest pain. Plan for Delta trop and outpatient fu with cardiology if neg. Will need prompt cardiology fu which she agreed to. Delta trop negative.   Sena Hitch, MD 03/27/14 [phone]

## 2014-03-26 NOTE — Discharge Instructions (Signed)

## 2014-03-30 ENCOUNTER — Inpatient Hospital Stay (HOSPITAL_COMMUNITY)
Admission: AD | Admit: 2014-03-30 | Discharge: 2014-04-01 | DRG: 291 | Disposition: A | Discharge status: Home / Self Care | Payer: PRIVATE HEALTH INSURANCE | Source: Ambulatory Visit | Attending: Cardiology | Admitting: Cardiology

## 2014-03-30 ENCOUNTER — Encounter: Payer: Self-pay | Admitting: Cardiology

## 2014-03-30 ENCOUNTER — Other Ambulatory Visit: Payer: Self-pay | Admitting: Cardiology

## 2014-03-30 ENCOUNTER — Ambulatory Visit (INDEPENDENT_AMBULATORY_CARE_PROVIDER_SITE_OTHER): Payer: PRIVATE HEALTH INSURANCE | Admitting: Cardiology

## 2014-03-30 ENCOUNTER — Encounter (HOSPITAL_COMMUNITY): Payer: Self-pay | Admitting: General Practice

## 2014-03-30 VITALS — BP 210/130 | HR 78 | Ht 61.0 in | Wt 275.0 lb

## 2014-03-30 DIAGNOSIS — E1129 Type 2 diabetes mellitus with other diabetic kidney complication: Secondary | ICD-10-CM | POA: Diagnosis present

## 2014-03-30 DIAGNOSIS — I509 Heart failure, unspecified: Secondary | ICD-10-CM | POA: Diagnosis present

## 2014-03-30 DIAGNOSIS — Z6841 Body Mass Index (BMI) 40.0 and over, adult: Secondary | ICD-10-CM

## 2014-03-30 DIAGNOSIS — I130 Hypertensive heart and chronic kidney disease with heart failure and stage 1 through stage 4 chronic kidney disease, or unspecified chronic kidney disease: Secondary | ICD-10-CM | POA: Diagnosis present

## 2014-03-30 DIAGNOSIS — I5031 Acute diastolic (congestive) heart failure: Secondary | ICD-10-CM

## 2014-03-30 DIAGNOSIS — E1165 Type 2 diabetes mellitus with hyperglycemia: Secondary | ICD-10-CM

## 2014-03-30 DIAGNOSIS — R079 Chest pain, unspecified: Secondary | ICD-10-CM

## 2014-03-30 DIAGNOSIS — I369 Nonrheumatic tricuspid valve disorder, unspecified: Secondary | ICD-10-CM

## 2014-03-30 DIAGNOSIS — I10 Essential (primary) hypertension: Secondary | ICD-10-CM | POA: Diagnosis present

## 2014-03-30 DIAGNOSIS — M1990 Unspecified osteoarthritis, unspecified site: Secondary | ICD-10-CM | POA: Diagnosis present

## 2014-03-30 DIAGNOSIS — Z87891 Personal history of nicotine dependence: Secondary | ICD-10-CM

## 2014-03-30 DIAGNOSIS — J45909 Unspecified asthma, uncomplicated: Secondary | ICD-10-CM | POA: Diagnosis present

## 2014-03-30 DIAGNOSIS — D509 Iron deficiency anemia, unspecified: Secondary | ICD-10-CM | POA: Diagnosis present

## 2014-03-30 DIAGNOSIS — K219 Gastro-esophageal reflux disease without esophagitis: Secondary | ICD-10-CM | POA: Diagnosis present

## 2014-03-30 DIAGNOSIS — N058 Unspecified nephritic syndrome with other morphologic changes: Secondary | ICD-10-CM | POA: Diagnosis present

## 2014-03-30 DIAGNOSIS — N189 Chronic kidney disease, unspecified: Principal | ICD-10-CM

## 2014-03-30 DIAGNOSIS — R0789 Other chest pain: Secondary | ICD-10-CM

## 2014-03-30 DIAGNOSIS — R072 Precordial pain: Secondary | ICD-10-CM | POA: Diagnosis present

## 2014-03-30 DIAGNOSIS — Z794 Long term (current) use of insulin: Secondary | ICD-10-CM | POA: Diagnosis not present

## 2014-03-30 DIAGNOSIS — IMO0001 Reserved for inherently not codable concepts without codable children: Secondary | ICD-10-CM

## 2014-03-30 DIAGNOSIS — Z79899 Other long term (current) drug therapy: Secondary | ICD-10-CM

## 2014-03-30 DIAGNOSIS — I5032 Chronic diastolic (congestive) heart failure: Secondary | ICD-10-CM | POA: Insufficient documentation

## 2014-03-30 DIAGNOSIS — N182 Chronic kidney disease, stage 2 (mild): Secondary | ICD-10-CM | POA: Diagnosis present

## 2014-03-30 DIAGNOSIS — I110 Hypertensive heart disease with heart failure: Secondary | ICD-10-CM

## 2014-03-30 HISTORY — DX: Chronic kidney disease, stage 2 (mild): N18.2

## 2014-03-30 HISTORY — DX: Iron deficiency anemia, unspecified: D50.9

## 2014-03-30 HISTORY — DX: Hypertensive heart disease without heart failure: I11.9

## 2014-03-30 HISTORY — DX: Headache: R51

## 2014-03-30 HISTORY — DX: Chronic diastolic (congestive) heart failure: I50.32

## 2014-03-30 HISTORY — DX: Other chest pain: R07.89

## 2014-03-30 LAB — HEMOGLOBIN A1C
Hgb A1c MFr Bld: 11.6 % — ABNORMAL HIGH (ref <5.7)
MEAN PLASMA GLUCOSE: 286 mg/dL — AB (ref <117)

## 2014-03-30 LAB — COMPREHENSIVE METABOLIC PANEL
ALT: 13 U/L (ref 0–35)
AST: 15 U/L (ref 0–37)
Albumin: 3.4 g/dL — ABNORMAL LOW (ref 3.5–5.2)
Alkaline Phosphatase: 99 U/L (ref 39–117)
Anion gap: 12 (ref 5–15)
BUN: 19 mg/dL (ref 6–23)
CO2: 25 meq/L (ref 19–32)
Calcium: 9.3 mg/dL (ref 8.4–10.5)
Chloride: 99 mEq/L (ref 96–112)
Creatinine, Ser: 0.98 mg/dL (ref 0.50–1.10)
GFR, EST AFRICAN AMERICAN: 74 mL/min — AB (ref >90)
GFR, EST NON AFRICAN AMERICAN: 64 mL/min — AB (ref >90)
GLUCOSE: 228 mg/dL — AB (ref 70–99)
Potassium: 4.2 mEq/L (ref 3.7–5.3)
Sodium: 136 mEq/L — ABNORMAL LOW (ref 137–147)
Total Bilirubin: 0.3 mg/dL (ref 0.3–1.2)
Total Protein: 8.1 g/dL (ref 6.0–8.3)

## 2014-03-30 LAB — PROTIME-INR
INR: 0.96 (ref 0.00–1.49)
Prothrombin Time: 12.8 seconds (ref 11.6–15.2)

## 2014-03-30 LAB — CBC WITH DIFFERENTIAL/PLATELET
Basophils Absolute: 0 10*3/uL (ref 0.0–0.1)
Basophils Relative: 1 % (ref 0–1)
EOS ABS: 0.5 10*3/uL (ref 0.0–0.7)
EOS PCT: 7 % — AB (ref 0–5)
HCT: 36.6 % (ref 36.0–46.0)
HEMOGLOBIN: 12.2 g/dL (ref 12.0–15.0)
LYMPHS ABS: 1.7 10*3/uL (ref 0.7–4.0)
Lymphocytes Relative: 27 % (ref 12–46)
MCH: 24.1 pg — AB (ref 26.0–34.0)
MCHC: 33.3 g/dL (ref 30.0–36.0)
MCV: 72.3 fL — ABNORMAL LOW (ref 78.0–100.0)
Monocytes Absolute: 0.4 10*3/uL (ref 0.1–1.0)
Monocytes Relative: 7 % (ref 3–12)
Neutro Abs: 3.6 10*3/uL (ref 1.7–7.7)
Neutrophils Relative %: 58 % (ref 43–77)
PLATELETS: 221 10*3/uL (ref 150–400)
RBC: 5.06 MIL/uL (ref 3.87–5.11)
RDW: 15.4 % (ref 11.5–15.5)
WBC: 6.1 10*3/uL (ref 4.0–10.5)

## 2014-03-30 LAB — MRSA PCR SCREENING: MRSA by PCR: NEGATIVE

## 2014-03-30 LAB — GLUCOSE, CAPILLARY
GLUCOSE-CAPILLARY: 361 mg/dL — AB (ref 70–99)
GLUCOSE-CAPILLARY: 313 mg/dL — AB (ref 70–99)
Glucose-Capillary: 223 mg/dL — ABNORMAL HIGH (ref 70–99)
Glucose-Capillary: 326 mg/dL — ABNORMAL HIGH (ref 70–99)

## 2014-03-30 LAB — PRO B NATRIURETIC PEPTIDE: Pro B Natriuretic peptide (BNP): 375.4 pg/mL — ABNORMAL HIGH (ref 0–125)

## 2014-03-30 LAB — MAGNESIUM: Magnesium: 1.6 mg/dL (ref 1.5–2.5)

## 2014-03-30 LAB — TROPONIN I: Troponin I: <0.3 ng/mL (ref <0.30)

## 2014-03-30 LAB — TSH: TSH: 2.05 u[IU]/mL (ref 0.350–4.500)

## 2014-03-30 LAB — APTT: aPTT: 32 seconds (ref 24–37)

## 2014-03-30 MED ORDER — ASPIRIN EC 81 MG PO TBEC
81.0000 mg | DELAYED_RELEASE_TABLET | Freq: Every day | ORAL | Status: DC
  Administered 2014-03-31 – 2014-04-01 (×2): 81 mg via ORAL
  Filled 2014-03-30 (×2): qty 1

## 2014-03-30 MED ORDER — INSULIN ASPART 100 UNIT/ML ~~LOC~~ SOLN
0.0000 [IU] | Freq: Every day | SUBCUTANEOUS | Status: DC
  Administered 2014-03-30: 4 [IU] via SUBCUTANEOUS
  Administered 2014-03-31: 3 [IU] via SUBCUTANEOUS

## 2014-03-30 MED ORDER — ASPIRIN 81 MG PO CHEW
324.0000 mg | CHEWABLE_TABLET | ORAL | Status: AC
  Administered 2014-03-30: 324 mg via ORAL
  Filled 2014-03-30: qty 4

## 2014-03-30 MED ORDER — HYDROCODONE-ACETAMINOPHEN 5-325 MG PO TABS
1.0000 | ORAL_TABLET | Freq: Four times a day (QID) | ORAL | Status: DC | PRN
  Administered 2014-03-31: 1 via ORAL
  Filled 2014-03-30: qty 2

## 2014-03-30 MED ORDER — FUROSEMIDE 10 MG/ML IJ SOLN
40.0000 mg | Freq: Once | INTRAMUSCULAR | Status: AC
  Administered 2014-03-30: 40 mg via INTRAVENOUS
  Filled 2014-03-30: qty 4

## 2014-03-30 MED ORDER — ONDANSETRON HCL 4 MG/2ML IJ SOLN: 4.0000 mg | Freq: Four times a day (QID) | INTRAMUSCULAR | Status: DC | PRN

## 2014-03-30 MED ORDER — METOPROLOL TARTRATE 1 MG/ML IV SOLN
2.5000 mg | Freq: Once | INTRAVENOUS | Status: AC
Start: 2014-03-30 — End: 2014-03-30
  Administered 2014-03-30: 2.5 mg via INTRAVENOUS

## 2014-03-30 MED ORDER — METOPROLOL TARTRATE 1 MG/ML IV SOLN
INTRAVENOUS | Status: AC
  Administered 2014-03-30: 5 mg
  Filled 2014-03-30: qty 5

## 2014-03-30 MED ORDER — METOPROLOL TARTRATE 25 MG PO TABS
25.0000 mg | ORAL_TABLET | Freq: Two times a day (BID) | ORAL | Status: DC
  Administered 2014-03-30 – 2014-03-31 (×3): 25 mg via ORAL
  Filled 2014-03-30 (×4): qty 1

## 2014-03-30 MED ORDER — NITROGLYCERIN IN D5W 200-5 MCG/ML-% IV SOLN
3.0000 ug/min | INTRAVENOUS | Status: DC
  Administered 2014-03-30: 5 ug/min via INTRAVENOUS
  Administered 2014-03-31: 9 ug/min via INTRAVENOUS
  Filled 2014-03-30 (×2): qty 250

## 2014-03-30 MED ORDER — ACETAMINOPHEN 325 MG PO TABS
650.0000 mg | ORAL_TABLET | ORAL | Status: DC | PRN
  Administered 2014-03-30 – 2014-04-01 (×4): 650 mg via ORAL
  Filled 2014-03-30 (×3): qty 2

## 2014-03-30 MED ORDER — NITROGLYCERIN 0.4 MG SL SUBL: 0.4000 mg | SUBLINGUAL_TABLET | SUBLINGUAL | Status: DC | PRN

## 2014-03-30 MED ORDER — HEPARIN SODIUM (PORCINE) 5000 UNIT/ML IJ SOLN
5000.0000 [IU] | Freq: Three times a day (TID) | INTRAMUSCULAR | Status: DC
  Administered 2014-03-30 – 2014-04-01 (×6): 5000 [IU] via SUBCUTANEOUS
  Filled 2014-03-30 (×11): qty 1

## 2014-03-30 MED ORDER — INSULIN ASPART 100 UNIT/ML ~~LOC~~ SOLN
0.0000 [IU] | Freq: Three times a day (TID) | SUBCUTANEOUS | Status: DC
  Administered 2014-03-31: 5 [IU] via SUBCUTANEOUS
  Administered 2014-03-31: 7 [IU] via SUBCUTANEOUS
  Administered 2014-04-01: 3 [IU] via SUBCUTANEOUS

## 2014-03-30 MED ORDER — ASPIRIN 300 MG RE SUPP
300.0000 mg | RECTAL | Status: AC
  Filled 2014-03-30: qty 1

## 2014-03-30 NOTE — Progress Notes (Signed)
Called MD Turner reported increase in BP orders to give IV lopressor 2.5 mg

## 2014-03-30 NOTE — Progress Notes (Signed)
Utilization Review Completed.Lori Mclaughlin T9/10/2013  

## 2014-03-30 NOTE — Progress Notes (Signed)
  Echocardiogram 2D Echocardiogram has been performed.  Georgian Co 03/30/2014, 3:24 PM

## 2014-03-30 NOTE — Progress Notes (Signed)
[place], [place], [place]  [address] Phone: [phone] Fax:  [phone]  Date:  03/30/2014   ID:  Lori Mclaughlin, DOB [birth year], MRN [id number]  PCP:  Scarlette Calico, MD  Cardiologist:  Fransico Him, MD     History of Present Illness: Lori Mclaughlin is a 53 y.o. female with a history of HTN and type II DM who presents today for evaluation of CP.  She says that on Monday she has sudden onset of chest pain while sitting at her computer.  She initially felt nauseated and went to the cafeteria to get something to eat but on the way back she started having SOB.  She went back to her desk to work and had sudden onset of sharp chest pain located over her left breast with heaviness.  The pain lasted about 2 hours.  The nurse took her BP and it was 220/184mHg and she was sent to the ER.  She went to the ER with atypical and typical CP and nonischemic EKG.  Her pain did not eased with NTG.  The pain was reproduced with palpation of her chest wall and movement.  Her BNP was mildly elevated but chest xray showed no CHF.  Yesterday she had reoccurrence of the Chest pressure lasting about 90 minutes and resolved with rest. Today she denies any chest pain.  She continues to have DOE.     Wt Readings from Last 3 Encounters:  03/30/14 275 lb (124.739 kg)  10/09/13 260 lb (117.935 kg)  05/27/12 274 lb 8 oz (124.512 kg)     Past Medical History  Diagnosis Date  . Allergic rhinitis   . Anemia   . Asthma   . Diabetes mellitus type II   . GERD (gastroesophageal reflux disease)   . Hypertension   . Osteoarthritis     Current Outpatient Prescriptions  Medication Sig Dispense Refill  . Blood Glucose Monitoring Suppl (CONTOUR NEXT EZ MONITOR) W/DEVICE KIT 1 Act by Does not apply route 2 (two) times daily.  1 kit  0  . ibuprofen (ADVIL,MOTRIN) 200 MG tablet Take 400 mg by mouth every 6 (six) hours as needed for mild pain.      .Marland Kitcheninsulin detemir (LEVEMIR FLEXPEN) 100  UNIT/ML injection Inject 50 Units into the skin at bedtime.  3 mL  11  . Multiple Vitamins-Minerals (MULTIVITAMIN WITH MINERALS) tablet Take 1 tablet by mouth daily.      . sitaGLIPtan-metformin (JANUMET) 50-1000 MG per tablet Take 1 tablet by mouth 2 (two) times daily with a meal.  180 tablet  1  . triamterene-hydrochlorothiazide (MAXZIDE-25) 37.5-25 MG per tablet Take 1 tablet by mouth daily.  90 tablet  1   No current facility-administered medications for this visit.    Allergies:    Allergies  Allergen Reactions  . Latex     REACTION: itching,rash  . Lisinopril     REACTION: headache    Social History:  The patient  reports that she quit smoking about 3 years ago. She has never used smokeless tobacco. She reports that she does not drink alcohol or use illicit drugs.   Family History:  The patient's family history includes Cancer in her father and other; Diabetes in her other; Hypertension in her brother and other; Stroke in her mother and other. There is no history of Kidney disease, Alcohol abuse, Asthma, COPD, Depression, Drug abuse, Early death, Hearing loss, Heart disease, or Hyperlipidemia.   ROS:  Please see  the history of present illness.      All other systems reviewed and negative.   PHYSICAL EXAM: VS:  BP 210/130  Pulse 78  Ht _0  (1.549 m)  Wt 275 lb (124.739 kg)  BMI 51.99 kg/m2  LMP 02/23/2012 Well nourished, well developed, in no acute distress HEENT: normal Neck: no JVD Cardiac:  normal S1, S2; RRR; no murmur Lungs:  clear to auscultation bilaterally, no wheezing, rhonchi or rales Abd: soft, nontender, no hepatomegaly Ext: no edema Skin: warm and dry Neuro:  CNs 2-12 intact, no focal abnormalities noted  EKG:     NSR with borderline LVH  ASSESSMENT:  1.  Chest pain that has typical and  Atypical components.  I do not think the sharp pain is cardiac but the chest pressure is more concerning.  This could be subendocardial ischemia from markedly elevated  BP but she has several CRF including obesity, HTN and DM.  She had recurrent CP yesterday. 2.  Accelerated HTN - BP markedly elevated this am in office.  3.  Acute diastolic CHF most likely secondary to #2 with increased BNP and LE edema with DOE.   4.  DM - poorly controlled - last BS wsa 382  PLAN: 1.  Admit to tele bed 2.  Cycle cardiac enzymes 3.  Lasix 71m IV now 4.  2D echo to assess LVF 5.  Check HbA1C and diabetic counseling 6.  NPO after MN 7.  2 day Lexiscan myoview if she rules out and LVF is normal to rule out ischemia 8.  PA and lat Chest xray 9.  Continue Maxide 10.  Lopressor 279mBID 11.  IV NTG gtt and titrated to keep SBP<150  Followup PRN pending results of stress test  Signed, TrFransico HimMD 03/30/2014 10:00 AM

## 2014-03-30 NOTE — H&P (Signed)
[place], [place]  [place], [place] [address]  Phone: [phone]  Fax: [phone]  Date: 03/30/2014  ID: RONDALYN BELFORD, DOB Jul 29, 1960, MRN [id number]  PCP: Scarlette Calico, MD  Cardiologist: Fransico Him, MD  History of Present Illness:  Lori Mclaughlin is a 53 y.o. female with a history of HTN and type II DM who presents today for evaluation of CP. She says that on Monday she has sudden onset of chest pain while sitting at her computer. She initially felt nauseated and went to the cafeteria to get something to eat but on the way back she started having SOB. She went back to her desk to work and had sudden onset of sharp chest pain located over her left breast with heaviness. The pain lasted about 2 hours. The nurse took her BP and it was 220/12mHg and she was sent to the ER. She went to the ER with atypical and typical CP and nonischemic EKG. Her pain did not eased with NTG. The pain was reproduced with palpation of her chest wall and movement. Her BNP was mildly elevated but chest xray showed no CHF. Yesterday she had reoccurrence of the Chest pressure lasting about 90 minutes and resolved with rest. Today she denies any chest pain. She continues to have DOE.  Wt Readings from Last 3 Encounters:   03/30/14  275 lb (124.739 kg)   10/09/13  260 lb (117.935 kg)   05/27/12  274 lb 8 oz (124.512 kg)    Past Medical History   Diagnosis  Date   .  Allergic rhinitis    .  Anemia    .  Asthma    .  Diabetes mellitus type II    .  GERD (gastroesophageal reflux disease)    .  Hypertension    .  Osteoarthritis     Current Outpatient Prescriptions   Medication  Sig  Dispense  Refill   .  Blood Glucose Monitoring Suppl (CONTOUR NEXT EZ MONITOR) W/DEVICE KIT  1 Act by Does not apply route 2 (two) times daily.  1 kit  0   .  ibuprofen (ADVIL,MOTRIN) 200 MG tablet  Take 400 mg by mouth every 6 (six) hours as needed for mild pain.     .[name] insulin detemir (LEVEMIR FLEXPEN) 100  UNIT/ML injection  Inject 50 Units into the skin at bedtime.  3 mL  11   .  Multiple Vitamins-Minerals (MULTIVITAMIN WITH MINERALS) tablet  Take 1 tablet by mouth daily.     .  sitaGLIPtan-metformin (JANUMET) 50-1000 MG per tablet  Take 1 tablet by mouth 2 (two) times daily with a meal.  180 tablet  1   .  triamterene-hydrochlorothiazide (MAXZIDE-25) 37.5-25 MG per tablet  Take 1 tablet by mouth daily.  90 tablet  1    No current facility-administered medications for this visit.   Allergies:  Allergies   Allergen  Reactions   .  Latex      REACTION: itching,rash   .  Lisinopril      REACTION: headache   Social History: The patient reports that she quit smoking about 3 years ago. She has never used smokeless tobacco. She reports that she does not drink alcohol or use illicit drugs.  Family History: The patient's family history includes Cancer in her father and other; Diabetes in her other; Hypertension in her brother and other; Stroke in her mother and other. There is no history of Kidney disease,  Alcohol abuse, Asthma, COPD, Depression, Drug abuse, Early death, Hearing loss, Heart disease, or Hyperlipidemia.  ROS: Please see the history of present illness. All other systems reviewed and negative.  PHYSICAL EXAM:  VS: BP 210/130  Pulse 78  Ht '5\' 1"'  (1.549 m)  Wt 275 lb (124.739 kg)  BMI 51.99 kg/m2  LMP 02/23/2012  Well nourished, well developed, in no acute distress  HEENT: normal  Neck: no JVD  Cardiac: normal S1, S2; RRR; no murmur  Lungs: clear to auscultation bilaterally, no wheezing, rhonchi or rales  Abd: soft, nontender, no hepatomegaly  Ext: no edema  Skin: warm and dry  Neuro: CNs 2-12 intact, no focal abnormalities noted  EKG: NSR with borderline LVH  ASSESSMENT:  1. Chest pain that has typical and Atypical components. I do not think the sharp pain is cardiac but the chest pressure is more concerning. This could be subendocardial ischemia from markedly elevated BP but she  has several CRF including obesity, HTN and DM. She had recurrent CP yesterday.  2. Accelerated HTN - BP markedly elevated this am in office.  3. Acute diastolic CHF most likely secondary to #2 with increased BNP and LE edema with DOE.  4. DM - poorly controlled - last BS wsa 382  PLAN:  1. Admit to tele bed  2. Cycle cardiac enzymes  3. Lasix 14m IV now  4. 2D echo to assess LVF  5. Check HbA1C and diabetic counseling  6. NPO after MN  7. 2 day Lexiscan myoview if she rules out and LVF is normal to rule out ischemia  8. PA and lat Chest xray  9. Continue Maxide  10. Lopressor 238mBID  11. IV NTG gtt and titrated to keep SBP<150  Followup PRN pending results of stress test  Signed,  TrFransico HimMD

## 2014-03-31 ENCOUNTER — Inpatient Hospital Stay (HOSPITAL_COMMUNITY): Payer: PRIVATE HEALTH INSURANCE

## 2014-03-31 ENCOUNTER — Other Ambulatory Visit (HOSPITAL_COMMUNITY): Payer: PRIVATE HEALTH INSURANCE

## 2014-03-31 DIAGNOSIS — I110 Hypertensive heart disease with heart failure: Secondary | ICD-10-CM

## 2014-03-31 DIAGNOSIS — I5031 Acute diastolic (congestive) heart failure: Secondary | ICD-10-CM

## 2014-03-31 DIAGNOSIS — I509 Heart failure, unspecified: Secondary | ICD-10-CM

## 2014-03-31 DIAGNOSIS — E1165 Type 2 diabetes mellitus with hyperglycemia: Secondary | ICD-10-CM

## 2014-03-31 DIAGNOSIS — IMO0001 Reserved for inherently not codable concepts without codable children: Secondary | ICD-10-CM

## 2014-03-31 DIAGNOSIS — R0789 Other chest pain: Secondary | ICD-10-CM

## 2014-03-31 LAB — CBC
HCT: 33.8 % — ABNORMAL LOW (ref 36.0–46.0)
HEMOGLOBIN: 11.1 g/dL — AB (ref 12.0–15.0)
MCH: 24.1 pg — ABNORMAL LOW (ref 26.0–34.0)
MCHC: 32.8 g/dL (ref 30.0–36.0)
MCV: 73.5 fL — ABNORMAL LOW (ref 78.0–100.0)
Platelets: 183 10*3/uL (ref 150–400)
RBC: 4.6 MIL/uL (ref 3.87–5.11)
RDW: 15.5 % (ref 11.5–15.5)
WBC: 6.7 10*3/uL (ref 4.0–10.5)

## 2014-03-31 LAB — BASIC METABOLIC PANEL
ANION GAP: 11 (ref 5–15)
BUN: 25 mg/dL — AB (ref 6–23)
CHLORIDE: 97 meq/L (ref 96–112)
CO2: 25 mEq/L (ref 19–32)
CREATININE: 1.13 mg/dL — AB (ref 0.50–1.10)
Calcium: 8.8 mg/dL (ref 8.4–10.5)
GFR, EST AFRICAN AMERICAN: 63 mL/min — AB (ref >90)
GFR, EST NON AFRICAN AMERICAN: 54 mL/min — AB (ref >90)
Glucose, Bld: 319 mg/dL — ABNORMAL HIGH (ref 70–99)
POTASSIUM: 4.2 meq/L (ref 3.7–5.3)
Sodium: 133 mEq/L — ABNORMAL LOW (ref 137–147)

## 2014-03-31 LAB — GLUCOSE, CAPILLARY
GLUCOSE-CAPILLARY: 216 mg/dL — AB (ref 70–99)
Glucose-Capillary: 332 mg/dL — ABNORMAL HIGH (ref 70–99)
Glucose-Capillary: 284 mg/dL — ABNORMAL HIGH (ref 70–99)
Glucose-Capillary: 288 mg/dL — ABNORMAL HIGH (ref 70–99)

## 2014-03-31 LAB — TROPONIN I: Troponin I: <0.3 ng/mL (ref <0.30)

## 2014-03-31 MED ORDER — REGADENOSON 0.4 MG/5ML IV SOLN
0.4000 mg | Freq: Once | INTRAVENOUS | Status: AC
  Administered 2014-04-01: 0.4 mg via INTRAVENOUS
  Filled 2014-03-31: qty 5

## 2014-03-31 MED ORDER — HYDRALAZINE HCL 20 MG/ML IJ SOLN
10.0000 mg | Freq: Once | INTRAMUSCULAR | Status: AC
  Administered 2014-03-31: 10 mg via INTRAVENOUS
  Filled 2014-03-31: qty 1

## 2014-03-31 MED ORDER — INSULIN DETEMIR 100 UNIT/ML ~~LOC~~ SOLN: 25.0000 [IU] | Freq: Every day | SUBCUTANEOUS | Status: DC

## 2014-03-31 MED ORDER — CARVEDILOL 12.5 MG PO TABS
12.5000 mg | ORAL_TABLET | Freq: Two times a day (BID) | ORAL | Status: DC
  Administered 2014-03-31 – 2014-04-01 (×2): 12.5 mg via ORAL
  Filled 2014-03-31 (×4): qty 1

## 2014-03-31 MED ORDER — INSULIN DETEMIR 100 UNIT/ML ~~LOC~~ SOLN
35.0000 [IU] | Freq: Every day | SUBCUTANEOUS | Status: DC
  Administered 2014-03-31: 35 [IU] via SUBCUTANEOUS
  Filled 2014-03-31 (×2): qty 0.35

## 2014-03-31 MED ORDER — TRIAMTERENE-HCTZ 37.5-25 MG PO TABS
1.0000 | ORAL_TABLET | Freq: Every day | ORAL | Status: DC
  Administered 2014-03-31 – 2014-04-01 (×2): 1 via ORAL
  Filled 2014-03-31 (×2): qty 1

## 2014-03-31 MED ORDER — TECHNETIUM TC 99M SESTAMIBI - CARDIOLITE
30.0000 | Freq: Once | INTRAVENOUS | Status: AC | PRN
  Administered 2014-03-31: 13:00:00 30 via INTRAVENOUS

## 2014-03-31 NOTE — Progress Notes (Signed)
Went to evaluate patient, wasn't in her room, was in stress test, will return to evaluate later.

## 2014-03-31 NOTE — Progress Notes (Addendum)
SUBJECTIVE: The patient is doing well today.  At this time, she denies chest pain, shortness of breath, or any new concerns.  [name] aspirin EC  81 mg Oral Daily  . carvedilol  12.5 mg Oral BID WC  . heparin  5,000 Units Subcutaneous 3 times per day  . insulin aspart  0-5 Units Subcutaneous QHS  . insulin aspart  0-9 Units Subcutaneous TID WC  . [START ON 04/01/2014] regadenoson  0.4 mg Intravenous Once  . triamterene-hydrochlorothiazide  1 tablet Oral Daily      OBJECTIVE: Physical Exam: Filed Vitals:   03/31/14 0321 03/31/14 0400 03/31/14 0700 03/31/14 0810  BP: 185/92 180/89 208/94 187/87  Pulse: 64   63  Temp: 98 F (36.7 C)   97.1 F (36.2 C)  TempSrc: Oral   Oral  Resp: Height:      Weight: 280 lb 3.3 oz (127.1 kg)     SpO2: 96% 95% 99% 98%    Intake/Output Summary (Last 24 hours) at 03/31/14 1211 Last data filed at 03/31/14 0850  Gross per 24 hour  Intake    648 ml  Output   1050 ml  Net   -402 ml    Telemetry reveals sinus rhythm  GEN- The patient is well appearing, alert and oriented x 3 today.   Head- normocephalic, atraumatic Eyes-  Sclera clear, conjunctiva pink Ears- hearing intact Oropharynx- clear Neck- supple, no JVP Lymph- no cervical lymphadenopathy Lungs- Clear to ausculation bilaterally, normal work of breathing Heart- Regular rate and rhythm, no murmurs, rubs or gallops, PMI not laterally displaced GI- soft, NT, ND, + BS Extremities- no clubbing, cyanosis, or edema Skin- no rash or lesion Psych- euthymic mood, full affect Neuro- strength and sensation are intact  LABS: Basic Metabolic Panel:  Recent Labs  96/04/54 1241 03/31/14 0247  NA 136* 133*  K 4.2 4.2  CL 99 97  CO2 25 25  GLUCOSE 228* 319*  BUN 19 25*  CREATININE 0.98 1.13*  CALCIUM 9.3 8.8  MG 1.6  --    Liver Function Tests:  Recent Labs  03/30/14 1241  AST 15  ALT 13  ALKPHOS 99  BILITOT 0.3  PROT 8.1  ALBUMIN 3.4*   No results found for this  basename: LIPASE, AMYLASE,  in the last 72 hours CBC:  Recent Labs  03/30/14 1241 03/31/14 0247  WBC 6.1 6.7  NEUTROABS 3.6  --   HGB 12.2 11.1*  HCT 36.6 33.8*  MCV 72.3* 73.5*  PLT 221 183   Cardiac Enzymes:  Recent Labs  03/30/14 1241 03/30/14 1640 03/30/14 2345  TROPONINI <0.30 <0.30 <0.30   BNP: No components found with this basename: POCBNP,  D-Dimer: No results found for this basename: DDIMER,  in the last 72 hours Hemoglobin A1C:  Recent Labs  03/30/14 1241  HGBA1C 11.6*   Fasting Lipid Panel: No results found for this basename: CHOL, HDL, LDLCALC, TRIG, CHOLHDL, LDLDIRECT,  in the last 72 hours Thyroid Function Tests:  Recent Labs  03/30/14 1241  TSH 2.050   Anemia Panel: No results found for this basename: VITAMINB12, FOLATE, FERRITIN, TIBC, IRON, RETICCTPCT,  in the last 72 hours  RADIOLOGY: Dg Chest 2 View  03/31/2014   CLINICAL DATA:  CHF  EXAM: CHEST  2 VIEW  COMPARISON:  03/26/2014  FINDINGS: Low lung volumes. No consolidation. Mild cardiomegaly. Normal vascularity. No pneumothorax.  IMPRESSION: No active cardiopulmonary disease.   Electronically Signed   By: Lenoria Farrier  Hoss M.D.   On: 03/31/2014 08:11   Dg Chest 2 View  03/26/2014   CLINICAL DATA:  Chest pain, hypertension, and diabetes.  EXAM: CHEST  2 VIEW  COMPARISON:  Portable chest x-ray of October 27, 2009  FINDINGS: The lungs are adequately inflated and clear. The cardiopericardial silhouette remains enlarged. The pulmonary vascularity is not engorged. The mediastinum is normal in width. The trachea is midline. There is no pleural effusion or pneumothorax. The bony thorax is unremarkable.  IMPRESSION: Stable enlargement of the cardiac silhouette without evidence of pulmonary edema or pulmonary vascular congestion.   Electronically Signed   By: David  [place]   On: 03/26/2014 15:29    ASSESSMENT AND PLAN:  1. Chest pain that has typical and Atypical components. 2 day myoview- rest portion today 2.  Hypertensive cardiovascular disease with CHF - change metoprolol to coreg, add maxzide, consider adding norvasc if BP remains elevated EF is preserved 3. Acute diastolic CHF most likely secondary to #2 with increased BNP and LE edema with DOE.  Add maxzide 4. DM - poorly controlled - last BS wsa [id number]  Will need close outpatient management  Stop IV nitro Transfer to telemetry Her elevated blood pressure is a chronic issue and should be safely managed on tele   Hillis Range, MD 03/31/2014 12:11 PM

## 2014-03-31 NOTE — Consult Note (Signed)
[facility]   Patient Demographics  Lori Mclaughlin, is a 53 y.o. female  CSN: [id number]  MRN: [id number]  DOB - January 31, 1961  Admit date - 03/30/2014  Admitting Physician Sueanne Margarita, MD  Outpatient Primary MD for the patient is Scarlette Calico, MD  LOS - 1   Consult requested in the Hospital by Sueanne Margarita, MD, On 03/31/2014    Reason for consult : Hyperglycemia     Assessment & Plan  1. Diabetes mellitus: Uncontrolled: This is most likely secondary to the change outpatient regimen while as an inpatient, I will resume her back on Levemir 35 units at bedtime, given her first dose now, and we'll continue with her current insulin sliding coverage, will continue to hold her oral hypoglycemic agent, as unclear if she will have any procedures done occluding cardiac cath so I would like to hold her metformin to her plan is more clear, and upon discharge patient can be resumed back on her home oral medication and her home dose levemir.  2. chest pain: Patient is having a stress test done, management as per cardiology . 3. Hypertension: Uncontrolled, management as per cardiology .     DVT Prophyla Family Communication - discussed with patient. Active Problems:   Chest pain   Past Medical History  Diagnosis Date  . Allergic rhinitis   . Anemia   . Asthma   . Diabetes mellitus type II   . GERD (gastroesophageal reflux disease)   . Hypertension   . Osteoarthritis   . Headache(784.0)     when my bp is up     Past Surgical History  Procedure Laterality Date  . Tubal ligation    . Shoulder arthroscopy Bilateral     Past Surgical History  Procedure Laterality Date  . Tubal ligation    . Shoulder arthroscopy Bilateral     HPI:-  Lori Mclaughlin CSN:[id number],MRN:[id number] is a 53 y.o. female, admitted yesterday for evaluation of her chest pain, patient is to have history of diabetes mellitus, hypertension,  patient is having nuclear stress test over 48 hours, a shunt was noticed to have uncontrolled blood sugar, but been on insulin sliding scale 3 times a day and at bedtime, so hospitalist service were consulted for further evaluation, patient reports she has been taking Levemir 50 units subcutaneous at bedtime, over the last year, and Janumet 2 times a day with meals, where she reports her blood sugar has been fairly controlled with that, patient capillary blood glucose monitoring were in the 200s and 300s range, a shunt oral hypoglycemic agents and Levemir were held upon admission as patient was n.p.o. for her stress test.    Review of Systems    In addition to the HPI above, No Fever-chills, No Headache, No changes with Vision or hearing, No problems swallowing food or Liquids, No Chest pain, Cough or Shortness of Breath, No Abdominal pain, No Nausea or Vommitting, Bowel movements are regular, No Blood in stool or Urine, No dysuria, No new skin rashes or bruises, No new joints pains-aches,  No new weakness, tingling, numbness in any extremity, No recent weight gain or loss, No polyuria, polydypsia or polyphagia, No significant Mental Stressors.  A full 10 point review of systems was obtained except as dictated above all other review of systems are negative.     Social History History  Substance Use Topics  . Smoking status: Former Smoker    Quit date: 08/29/2010  . Smokeless tobacco: Never Used  . Alcohol  Use: No     Comment: quit 4 years     Family History Family History  Problem Relation Age of Onset  . Diabetes Other   . Hypertension Other   . Stroke Other   . Cancer Other     colon; prostate  . Stroke Mother   . Cancer Father   . Hypertension Brother   . Kidney disease Neg Hx   . Alcohol abuse Neg Hx   . Asthma Neg Hx   . COPD Neg Hx   . Depression Neg Hx   . Drug abuse Neg Hx   . Early death Neg Hx   . Hearing loss Neg Hx   . Heart disease Neg Hx   .  Hyperlipidemia Neg Hx      Prior to Admission medications   Medication Sig Start Date End Date Taking? Authorizing Provider  ibuprofen (ADVIL,MOTRIN) 200 MG tablet Take 400 mg by mouth every 6 (six) hours as needed for mild pain.   Yes Historical Provider, MD  insulin detemir (LEVEMIR FLEXPEN) 100 UNIT/ML injection Inject 50 Units into the skin at bedtime. 05/27/12  Yes Janith Lima, MD  Multiple Vitamins-Minerals (MULTIVITAMIN WITH MINERALS) tablet Take 1 tablet by mouth daily.   Yes Historical Provider, MD  sitaGLIPtan-metformin (JANUMET) 50-1000 MG per tablet Take 1 tablet by mouth 2 (two) times daily with a meal. 05/27/12  Yes Janith Lima, MD  triamterene-hydrochlorothiazide (MAXZIDE-25) 37.5-25 MG per tablet Take 1 tablet by mouth daily. 10/09/13  Yes Janith Lima, MD  Blood Glucose Monitoring Suppl (CONTOUR NEXT EZ MONITOR) W/DEVICE KIT 1 Act by Does not apply route 2 (two) times daily. 05/27/12   Janith Lima, MD    Allergies  Allergen Reactions  . Latex     REACTION: itching,rash  . Lisinopril     REACTION: headache     Physical Exam   Vitals  Blood pressure 202/103, pulse 63, temperature 98.1 F (36.7 C), temperature source Oral, resp. rate 18, height 5' 1" (1.549 m), weight 127.1 kg (280 lb 3.3 oz), last menstrual period 02/23/2012, SpO2 97.00%.   1. General obese female lying in bed in NAD,   2. Normal affect and insight, Not Suicidal or Homicidal, Awake Alert, Oriented X 3.  3. No F.N deficits, ALL C.Nerves Intact, Strength 5/5 all 4 extremities, Sensation intact all   4 extremities, Plantars down going.  4. Ears and Eyes appear Normal, Conjunctivae clear, PERRLA. Moist Oral Mucosa.  5. Supple Neck, No JVD, No cervical lymphadenopathy appriciated, No Carotid Bruits.  6. Symmetrical Chest wall movement, Good air movement bilaterally, CTAB.  7. RRR, No Gallops, Rubs or Murmurs, No Parasternal Heave.  8. Positive Bowel Sounds, Abdomen Soft, Non tender, No  organomegaly appriciated, No rebound -guarding or rigidity.  9.  No Cyanosis, Normal Skin Turgor, No Skin Rash or Bruise.  10. Good muscle tone,  joints appear normal , no effusions, Normal ROM.  11. No Palpable Lymph Nodes in Neck or Axillae   Data Review  CBC  Recent Labs Lab 03/26/14 1353 03/30/14 1241 03/31/14 0247  WBC 6.8 6.1 6.7  HGB 11.6* 12.2 11.1*  HCT 35.4* 36.6 33.8*  PLT 191 221 183  MCV 72.5* 72.3* 73.5*  MCH 23.8* 24.1* 24.1*  MCHC 32.8 33.3 32.8  RDW 15.1 15.4 15.5  LYMPHSABS  --  1.7  --   MONOABS  --  0.4  --   EOSABS  --  0.5  --   BASOSABS  --  0.0  --     Chemistries   Recent Labs Lab 03/26/14 1353 03/30/14 1241 03/31/14 0247  NA 135* 136* 133*  K 4.2 4.2 4.2  CL 97 99 97  CO2 _0 GLUCOSE 382* 228* 319*  BUN 19 19 25*  CREATININE 1.00 0.98 1.13*  CALCIUM 9.0 9.3 8.8  MG  --  1.6  --   AST  --  15  --   ALT  --  13  --   ALKPHOS  --  99  --   BILITOT  --  0.3  --    ------------------------------------------------------------------------------------------------------------------ estimated creatinine clearance is 71.4 ml/min (by C-G formula based on Cr of 1.13). ------------------------------------------------------------------------------------------------------------------  Recent Labs  03/30/14 1241  HGBA1C 11.6*   ------------------------------------------------------------------------------------------------------------------ No results found for this basename: CHOL, HDL, LDLCALC, TRIG, CHOLHDL, LDLDIRECT,  in the last 72 hours ------------------------------------------------------------------------------------------------------------------  Recent Labs  03/30/14 1241  TSH 2.050   ------------------------------------------------------------------------------------------------------------------ No results found for this basename: VITAMINB12, FOLATE, FERRITIN, TIBC, IRON, RETICCTPCT,  in the last 72  hours  Coagulation profile  Recent Labs Lab 03/30/14 1241  INR 0.96    No results found for this basename: DDIMER,  in the last 72 hours  Cardiac Enzymes  Recent Labs Lab 03/30/14 1241 03/30/14 1640 03/30/14 2345  TROPONINI <0.30 <0.30 <0.30   ------------------------------------------------------------------------------------------------------------------ No components found with this basename: POCBNP,   ------------------------------------------------------------------------------------------------------------------- Micro Results Recent Results (from the past 240 hour(s))  MRSA PCR SCREENING     Status: None   Collection Time    03/30/14 10:59 AM      Result Value Ref Range Status   MRSA by PCR NEGATIVE  NEGATIVE Final   Comment:            The GeneXpert MRSA Assay (FDA     approved for NASAL specimens     only), is one component of a     comprehensive MRSA colonization     surveillance program. It is not     intended to diagnose MRSA     infection nor to guide or     monitor treatment for     MRSA infections.   ------------------------------------------------------------------------------------------------------------------ Radiology Reports Dg Chest 2 View  03/31/2014   CLINICAL DATA:  CHF  EXAM: CHEST  2 VIEW  COMPARISON:  03/26/2014  FINDINGS: Low lung volumes. No consolidation. Mild cardiomegaly. Normal vascularity. No pneumothorax.  IMPRESSION: No active cardiopulmonary disease.   Electronically Signed   By: Maryclare Bean M.D.   On: 03/31/2014 08:11   Dg Chest 2 View  03/26/2014   CLINICAL DATA:  Chest pain, hypertension, and diabetes.  EXAM: CHEST  2 VIEW  COMPARISON:  Portable chest x-ray of October 27, 2009  FINDINGS: The lungs are adequately inflated and clear. The cardiopericardial silhouette remains enlarged. The pulmonary vascularity is not engorged. The mediastinum is normal in width. The trachea is midline. There is no pleural effusion or pneumothorax. The  bony thorax is unremarkable.  IMPRESSION: Stable enlargement of the cardiac silhouette without evidence of pulmonary edema or pulmonary vascular congestion.   Electronically Signed   By: David  [place]   On: 03/26/2014 15:29    Scheduled Meds: . aspirin EC  81 mg Oral Daily  . carvedilol  12.5 mg Oral BID WC  . heparin  5,000 Units Subcutaneous 3 times per day  . insulin aspart  0-5 Units Subcutaneous QHS  . insulin aspart  0-9 Units Subcutaneous TID WC  . insulin  detemir  35 Units Subcutaneous QHS  . [START ON 04/01/2014] regadenoson  0.4 mg Intravenous Once  . triamterene-hydrochlorothiazide  1 tablet Oral Daily   Continuous Infusions:  PRN Meds:.acetaminophen, HYDROcodone-acetaminophen, nitroGLYCERIN, ondansetron (ZOFRAN) IV  Antibiotics.  Anti-infectives   None      Time Spent in minutes 45 minute  ELGERGAWY, DAWOOD M.D on 03/31/2014 at 4:40 PM  [facility] Office  [phone]

## 2014-03-31 NOTE — Progress Notes (Signed)
Pt complained of H/A without relief from Tylenol. In addition pt SBP in 200's and CBG 300+. Cardiology MD called and order was placed for PRN Vicodin,  Hydralazine one-time dose, and sensitive sliding scale for CBG coverage. Pt's vital signs stable and patient resting, will continue to monitor pt.

## 2014-03-31 NOTE — Progress Notes (Signed)
Called to give report patient eating at this time will call back

## 2014-03-31 NOTE — ED Provider Notes (Signed)
I agree with follow up of previous providers recommendations.  Lori Mclaughlin   Lori Skeens, MD 03/31/14 [id number]

## 2014-04-01 ENCOUNTER — Encounter (HOSPITAL_COMMUNITY): Payer: Self-pay | Admitting: Physician Assistant

## 2014-04-01 ENCOUNTER — Inpatient Hospital Stay (HOSPITAL_COMMUNITY): Payer: PRIVATE HEALTH INSURANCE

## 2014-04-01 ENCOUNTER — Other Ambulatory Visit: Payer: Self-pay | Admitting: Physician Assistant

## 2014-04-01 DIAGNOSIS — R079 Chest pain, unspecified: Secondary | ICD-10-CM

## 2014-04-01 DIAGNOSIS — N182 Chronic kidney disease, stage 2 (mild): Secondary | ICD-10-CM

## 2014-04-01 LAB — HEMOGLOBIN A1C
HEMOGLOBIN A1C: 11.2 % — AB (ref <5.7)
Mean Plasma Glucose: 275 mg/dL — ABNORMAL HIGH (ref <117)

## 2014-04-01 LAB — GLUCOSE, CAPILLARY
Glucose-Capillary: 205 mg/dL — ABNORMAL HIGH (ref 70–99)
Glucose-Capillary: 204 mg/dL — ABNORMAL HIGH (ref 70–99)

## 2014-04-01 MED ORDER — HYDRALAZINE HCL 20 MG/ML IJ SOLN
10.0000 mg | Freq: Once | INTRAMUSCULAR | Status: AC
  Administered 2014-04-01: 10 mg via INTRAVENOUS

## 2014-04-01 MED ORDER — HYDRALAZINE HCL 20 MG/ML IJ SOLN
INTRAMUSCULAR | Status: AC
  Administered 2014-04-01: 10 mg via INTRAVENOUS
  Filled 2014-04-01: qty 1

## 2014-04-01 MED ORDER — LOSARTAN POTASSIUM 50 MG PO TABS: 50.0000 mg | ORAL_TABLET | Freq: Every day | ORAL | Status: DC

## 2014-04-01 MED ORDER — INSULIN DETEMIR 100 UNIT/ML ~~LOC~~ SOLN
10.0000 [IU] | Freq: Once | SUBCUTANEOUS | Status: DC
  Filled 2014-04-01: qty 0.1

## 2014-04-01 MED ORDER — TECHNETIUM TC 99M SESTAMIBI GENERIC - CARDIOLITE
30.0000 | Freq: Once | INTRAVENOUS | Status: AC | PRN
  Administered 2014-04-01: 30 via INTRAVENOUS

## 2014-04-01 MED ORDER — REGADENOSON 0.4 MG/5ML IV SOLN
INTRAVENOUS | Status: AC
  Filled 2014-04-01: qty 5

## 2014-04-01 MED ORDER — AMLODIPINE BESYLATE 5 MG PO TABS
5.0000 mg | ORAL_TABLET | Freq: Every day | ORAL | Status: DC
  Administered 2014-04-01: 5 mg via ORAL
  Filled 2014-04-01: qty 1

## 2014-04-01 MED ORDER — CARVEDILOL 12.5 MG PO TABS: 12.5000 mg | ORAL_TABLET | Freq: Two times a day (BID) | ORAL | Status: DC

## 2014-04-01 MED ORDER — ACETAMINOPHEN 325 MG PO TABS
ORAL_TABLET | ORAL | Status: AC
  Filled 2014-04-01: qty 2

## 2014-04-01 MED ORDER — ASPIRIN 81 MG PO TBEC: 81.0000 mg | DELAYED_RELEASE_TABLET | Freq: Every day | ORAL | Status: DC

## 2014-04-01 MED ORDER — LOSARTAN POTASSIUM 50 MG PO TABS
50.0000 mg | ORAL_TABLET | Freq: Every day | ORAL | Status: DC
  Administered 2014-04-01: 50 mg via ORAL
  Filled 2014-04-01: qty 1

## 2014-04-01 MED ORDER — INSULIN DETEMIR 100 UNIT/ML ~~LOC~~ SOLN
50.0000 [IU] | Freq: Every day | SUBCUTANEOUS | Status: DC
  Filled 2014-04-01: qty 0.5

## 2014-04-01 NOTE — Discharge Summary (Signed)
Discharge Summary   Patient ID: Lori Mclaughlin MRN: [id number], DOB/AGE: [birth year] 55 y.o. Admit date: 03/30/2014 D/C date:     04/01/2014  Primary Care Provider: Sanda Linger, MD Primary Cardiologist: Dr. Mayford Knife  Primary Discharge Diagnoses:  1. Precordial chest pain, atypical - normal nuclear stress test this admission 2. Hypertensive heart disease with acute diastolic CHF 3. Uncontrolled DM A1C 11.6 4. HTN 5. Microcytic anemia, chronic appearing 6. CKD stage II  Secondary Discharge Diagnoses:  1. Allergic rhinitis   2. Asthma   3. GERD (gastroesophageal reflux disease)   4. Osteoarthritis   Hospital Course: [name] is a 53 y.o. female with a history of HTN and type II DM who presented to the office 03/30/14 for evaluation of CP following an ED visit 03/26/14.  On Monday 03/26/14, she had sudden onset of chest pain while sitting at her computer. She initially felt nauseated and went to the cafeteria to get something to eat but on the way back she started having SOB. She went back to her desk to work and had sudden onset of sharp chest pain located over her left breast with heaviness. The pain lasted about 2 hours. The nurse took her BP and it was 220/154mmHg and she was sent to the ER. There she had atypical and typical CP and nonischemic EKG. Her pain did not eased with NTG. The pain was reproduced with palpation of her chest wall and movement. Her BNP was mildly elevated but chest xray showed no CHF. She was discharged from the ED with cardiac followup. On 03/29/14, she had reoccurrence of the chest pressure lasting about 90 minutes and resolved with rest. She saw Dr. Mayford Knife in the office on 03/30/14 at which time she was no longer having CP but was having DOE. BP was 210/130; she was felt to have acute diastolic CHF with increased BNP (375 on 9/4; was 571 on 8/31) and LE edema/DOE. She was admitted to the hospital and given  IV Lasix, then started on metoprolol and IV NTG  gtt. She was started on aspirin. 2D Echo showed moderate LVH, EF 55-60%, grade 1 diastolic dysfunction, mildly dilated LA. Troponins remained negative. Metoprolol was later changed to Coreg. She tolerated this change well. She underwent 2 day protocol nuclear stress test which was normal. Blood pressure has improved to 123/52 today. Today she was started on both amlodipine and losartan by different providers - per d/w Dr. Johney Frame, we will hold off on amlodipine for now. We do feel given CKD II and DM she would benefit from continuing the losartan. If BP remains elevated as outpatient we can consider adding amlodipine or titrating Coreg. Dr. Graciela Husbands has seen and examined the patient today and feels he is stable for discharge. I have left a message on our office's scheduling voicemail requesting a follow-up appointment, and our office will call the patient with this appointment.  The patient was instructed to f/u PCP for: - uncontrolled DM, A1C 11.6. IM saw the patient during her admission who addressed her hyperglycemia in the hospital, and recommended to discharge her on her home regimen. The importance of blood sugar control was stressed. - mild renal insufficiency - it appears she likely has CKD stage II - we will plan to check a BMET next week (office will call her) but further monitoring of this with regards to her DM should be at the discretion of her PCP. She was also instructed to avoid NSAIDS. - microcytic anemia without evidence for  bleeding (chronic) - as outpatient, might consider statin given DM  Discharge Vitals: Blood pressure 123/52, pulse 65, temperature 98.1 F (36.7 C), temperature source Oral, resp. rate 20, height  (1.549 m), weight 273 lb 3.2 oz (123.923 kg), last menstrual period 02/23/2012, SpO2 98.00%.  Labs: Lab Results  Component Value Date   WBC 6.7 03/31/2014   HGB 11.1* 03/31/2014   HCT 33.8* 03/31/2014   MCV 73.5* 03/31/2014   PLT 183 03/31/2014    Recent Labs Lab  03/30/14 1241 03/31/14 0247  NA 136* 133*  K 4.2 4.2  CL 99 97  CO2 25 25  BUN 19 25*  CREATININE 0.98 1.13*  CALCIUM 9.3 8.8  PROT 8.1  --   BILITOT 0.3  --   ALKPHOS 99  --   ALT 13  --   AST 15  --   GLUCOSE 228* 319*    Recent Labs  03/30/14 1241 03/30/14 1640 03/30/14 2345  TROPONINI <0.30 <0.30 <0.30   Lab Results  Component Value Date   CHOL 162 01/26/2012   HDL 54.20 01/26/2012   LDLCALC 93 01/26/2012   TRIG 73.0 01/26/2012     Diagnostic Studies/Procedures   Dg Chest 2 View 03/31/2014   CLINICAL DATA:  CHF  EXAM: CHEST  2 VIEW  COMPARISON:  03/26/2014  FINDINGS: Low lung volumes. No consolidation. Mild cardiomegaly. Normal vascularity. No pneumothorax.  IMPRESSION: No active cardiopulmonary disease.   Electronically Signed   By: Maryclare Bean M.D.   On: 03/31/2014 08:11   Dg Chest 2 View 03/26/2014   CLINICAL DATA:  Chest pain, hypertension, and diabetes.  EXAM: CHEST  2 VIEW  COMPARISON:  Portable chest x-ray of October 27, 2009  FINDINGS: The lungs are adequately inflated and clear. The cardiopericardial silhouette remains enlarged. The pulmonary vascularity is not engorged. The mediastinum is normal in width. The trachea is midline. There is no pleural effusion or pneumothorax. The bony thorax is unremarkable.  IMPRESSION: Stable enlargement of the cardiac silhouette without evidence of pulmonary edema or pulmonary vascular congestion.   Electronically Signed   By: David  [place]   On: 03/26/2014 15:29   Nm Myocar Multi W/spect W/wall Motion / Ef 04/01/2014   CLINICAL DATA:  Chest pain  EXAM: MYOCARDIAL IMAGING WITH SPECT (REST AND PHARMACOLOGIC-STRESS - 2 DAY PROTOCOL)  GATED LEFT VENTRICULAR WALL MOTION STUDY  LEFT VENTRICULAR EJECTION FRACTION  TECHNIQUE: Standard myocardial SPECT imaging was performed after resting intravenous injection of 30 mCi Tc-24m sestamibi. Subsequently, on a second day, intravenous infusion of Lexiscan was performed under the supervision of the  Cardiology staff. At peak effect of the drug, 30 mCi Tc-75m sestamibi was injected intravenously and standard myocardial SPECT imaging was performed. Quantitative gated imaging was also performed to evaluate left ventricular wall motion, and estimate left ventricular ejection fraction.  COMPARISON:  None.  FINDINGS: Perfusion: Allowing for breast attenuation artifact, there is no decreased activity in the left ventricle on stress imaging to suggest reversible ischemia or infarction.  Wall Motion: Normal left ventricular wall motion. No left ventricular dilation.  Left Ventricular Ejection Fraction: 57 %  End diastolic volume 104 ml  End systolic volume 40 for ml  IMPRESSION: 1. No reversible ischemia or infarction. Breast attenuation artifact is noted.  2. Normal left ventricular wall motion.  3. Left ventricular ejection fraction 57%  4. Low-risk stress test findings*.  *2012 Appropriate Use Criteria for Coronary Revascularization Focused Update: J Am Coll Cardiol. 2012;59(9):857-881. http://content.dementiazones.com.aspx?articleid=1201161   Electronically  Signed   By: Maryclare Bean M.D.   On: 04/01/2014 13:08   2D echo 03/30/14 - Left ventricle: The cavity size was normal. Wall thickness was increased in a pattern of moderate LVH. Systolic function was normal. The estimated ejection fraction was in the range of 55% to 60%. Doppler parameters are consistent with abnormal left ventricular relaxation (grade 1 diastolic dysfunction). - Left atrium: The atrium was mildly dilated.   Discharge Medications   Current Discharge Medication List    START taking these medications   Details  aspirin EC 81 MG EC tablet Take 1 tablet (81 mg total) by mouth daily.       carvedilol (COREG) 12.5 MG tablet Take 1 tablet (12.5 mg total) by mouth 2 (two) times daily with a meal. Qty: 60 tablet, Refills: 6    losartan (COZAAR) 50 MG tablet Take 1 tablet (50 mg total) by mouth daily. Qty: 30 tablet, Refills: 6       CONTINUE these medications which have NOT CHANGED   Details  insulin detemir (LEVEMIR FLEXPEN) 100 UNIT/ML injection Inject 50 Units into the skin at bedtime.     Multiple Vitamins-Minerals (MULTIVITAMIN WITH MINERALS) tablet Take 1 tablet by mouth daily.    sitaGLIPtan-metformin (JANUMET) 50-1000 MG per tablet Take 1 tablet by mouth 2 (two) times daily with a meal.    Associated Diagnoses: Type II or unspecified type diabetes mellitus without mention of complication, uncontrolled    triamterene-hydrochlorothiazide (MAXZIDE-25) 37.5-25 MG per tablet Take 1 tablet by mouth daily.    Associated Diagnoses: Unspecified essential hypertension      STOP taking these medications     ibuprofen (ADVIL,MOTRIN) 200 MG tablet         Disposition   The patient will be discharged in stable condition to home. Discharge Instructions   Diet - low sodium heart healthy    Complete by:  As directed   Diabetic diet     Increase activity slowly    Complete by:  As directed   Your labwork suggests you have Chronic Kidney Disease Stage 2 likely due to your high blood pressure and diabetes. Patients with kidney abnormalities should generally stay away from medicines like ibuprofen, Advil, Motrin, naproxen, and Aleve due to risk of making kidney function worse. You may take Tylenol as directed or talk to your primary doctor about alternatives.  Your bloodwork showed you are mildly anemic, but this seems chronic. This needs to be followed by your primary care doctor.  Please monitor your blood pressure occasionally at home. Call your doctor if you tend to get readings of greater than 130 on the top number or 80 on the bottom number.  It is VERY IMPORTANT that you follow up with your primary care doctor for your uncontrolled diabetes. Diabetes can easily lead to serious consequences such as heart attack, stroke, peripheral vascular disease including amputated limbs, kidney failure requiring dialysis  treatments, blindness, chronic pain from nerve damage, and early death. Please call when primary care doctor's office re-opens to make that appointment.          Follow-up Information   Follow up with Sanda Linger, MD. (See "discharge instructions")    Specialty:  Internal Medicine   Contact information:   [address] [place] [address] [phone]       Follow up with Quintella Reichert, MD. (Office will call you for your followup appointment. Call office if you have not heard back in 3 days.  We will also plan for repeat labwork in a few days since your medications have changed.)    Specialty:  Cardiology   Contact information:   [address] [place] [address] [phone]         Duration of Discharge Encounter: Greater than 30 minutes including physician and PA time.  Signed, Kriste Basque Dunn PA-C 04/01/2014, 2:52 PM   Hillis Range MD

## 2014-04-01 NOTE — Progress Notes (Signed)
Patient Demographics  Lori Mclaughlin, is a 53 y.o. female, DOB - [birth year], ZOX:[id number]  Admit date - 03/30/2014   Admitting Physician Quintella Reichert, MD  Outpatient Primary MD for the patient is Sanda Linger, MD  LOS - 2   Reason for medical consult: Uncontrolled diabetes mellitus      Subjective:   Lori Mclaughlin today has, No headache, No chest pain, No abdominal pain - No Nausea, No new weakness tingling or numbness, No Cough - SOB. Denies any constipation.  Assessment & Plan    Principal Problem:   Chest pain Active Problems:   Type II diabetes mellitus with renal manifestations, uncontrolled   HYPERTENSION  1. Diabetes mellitus:  Better controlled than yesterday, finger sticks in the 200s range instead of 300, but still significantly elevated, will resume her back on her home dose Levemir 50 units subcutaneous at bedtime, we'll give her 10 units of Levemir this morning, and continue with insulin sliding scale, she can be resumed back on her oral hypoglycemic agents once decision is made not to perform any procedures with IV contrast, and if patient is to be discharged, she can be discharged on her home regimen.  2. chest pain:  Patient is having the second part of the stress test done today, management as per cardiology .  3. Hypertension:   management as per cardiology .     Code Status: Full             Medications  Scheduled Meds: . aspirin EC  81 mg Oral Daily  . carvedilol  12.5 mg Oral BID WC  . heparin  5,000 Units Subcutaneous 3 times per day  . insulin aspart  0-5 Units Subcutaneous QHS  . insulin aspart  0-9 Units Subcutaneous TID WC  . insulin detemir  10 Units Subcutaneous Once  . insulin detemir  50 Units Subcutaneous QHS  . losartan  50 mg Oral Daily  . regadenoson      .  triamterene-hydrochlorothiazide  1 tablet Oral Daily   Continuous Infusions:  PRN Meds:.acetaminophen, HYDROcodone-acetaminophen, nitroGLYCERIN, ondansetron (ZOFRAN) IV  DVT Prophylaxis  L Heparin - SCDs  Lab Results  Component Value Date   PLT 183 03/31/2014    Antibiotics    Anti-infectives   None          Objective:   Filed Vitals:   03/31/14 1900 03/31/14 2009 04/01/14 0536 04/01/14 0857  BP:  155/63 160/66 194/100  Pulse:  63 65 66  Temp: 98.3 F (36.8 C) 98.3 F (36.8 C) 98 F (36.7 C)   TempSrc: Oral Oral Oral   Resp:  18 18   Height:      Weight:   123.923 kg (273 lb 3.2 oz)   SpO2:  96% 99%     Wt Readings from Last 3 Encounters:  04/01/14 123.923 kg (273 lb 3.2 oz)  03/30/14 124.739 kg (275 lb)  10/09/13 117.935 kg (260 lb)     Intake/Output Summary (Last 24 hours) at 04/01/14 0929 Last data filed at 04/01/14 0913  Gross per 24 hour  Intake    600 ml  Output   2250 ml  Net  -1650 ml     Physical Exam  Awake Alert, Oriented  X 3, No new F.N deficits, Normal affect Adams.AT,PERRAL Supple Neck,No JVD, No cervical lymphadenopathy appriciated.  Symmetrical Chest wall movement, Good air movement bilaterally, CTAB RRR,No Gallops,Rubs or new Murmurs, No Parasternal Heave +ve B.Sounds, Abd Soft, No tenderness, No organomegaly appriciated, No rebound - guarding or rigidity. No Cyanosis, Clubbing or edema, No new Rash or bruise     Data Review   Micro Results Recent Results (from the past 240 hour(s))  MRSA PCR SCREENING     Status: None   Collection Time    03/30/14 10:59 AM      Result Value Ref Range Status   MRSA by PCR NEGATIVE  NEGATIVE Final   Comment:            The GeneXpert MRSA Assay (FDA     approved for NASAL specimens     only), is one component of a     comprehensive MRSA colonization     surveillance program. It is not     intended to diagnose MRSA     infection nor to guide or     monitor treatment for     MRSA infections.      Radiology Reports Dg Chest 2 View  03/31/2014   CLINICAL DATA:  CHF  EXAM: CHEST  2 VIEW  COMPARISON:  03/26/2014  FINDINGS: Low lung volumes. No consolidation. Mild cardiomegaly. Normal vascularity. No pneumothorax.  IMPRESSION: No active cardiopulmonary disease.   Electronically Signed   By: Maryclare Bean M.D.   On: 03/31/2014 08:11    CBC  Recent Labs Lab 03/26/14 1353 03/30/14 1241 03/31/14 0247  WBC 6.8 6.1 6.7  HGB 11.6* 12.2 11.1*  HCT 35.4* 36.6 33.8*  PLT 191 221 183  MCV 72.5* 72.3* 73.5*  MCH 23.8* 24.1* 24.1*  MCHC 32.8 33.3 32.8  RDW 15.1 15.4 15.5  LYMPHSABS  --  1.7  --   MONOABS  --  0.4  --   EOSABS  --  0.5  --   BASOSABS  --  0.0  --     Chemistries   Recent Labs Lab 03/26/14 1353 03/30/14 1241 03/31/14 0247  NA 135* 136* 133*  K 4.2 4.2 4.2  CL 97 99 97  CO2 GLUCOSE 382* 228* 319*  BUN 19 19 25*  CREATININE 1.00 0.98 1.13*  CALCIUM 9.0 9.3 8.8  MG  --  1.6  --   AST  --  15  --   ALT  --  13  --   ALKPHOS  --  99  --   BILITOT  --  0.3  --    ------------------------------------------------------------------------------------------------------------------ estimated creatinine clearance is 70.3 ml/min (by C-G formula based on Cr of 1.13). ------------------------------------------------------------------------------------------------------------------  Recent Labs  03/30/14 1241  HGBA1C 11.6*   ------------------------------------------------------------------------------------------------------------------ No results found for this basename: CHOL, HDL, LDLCALC, TRIG, CHOLHDL, LDLDIRECT,  in the last 72 hours ------------------------------------------------------------------------------------------------------------------  Recent Labs  03/30/14 1241  TSH 2.050   ------------------------------------------------------------------------------------------------------------------ No results found for this basename: VITAMINB12,  FOLATE, FERRITIN, TIBC, IRON, RETICCTPCT,  in the last 72 hours  Coagulation profile  Recent Labs Lab 03/30/14 1241  INR 0.96    No results found for this basename: DDIMER,  in the last 72 hours  Cardiac Enzymes  Recent Labs Lab 03/30/14 1241 03/30/14 1640 03/30/14 2345  TROPONINI <0.30 <0.30 <0.30   ------------------------------------------------------------------------------------------------------------------ No components found with this basename: POCBNP,      Time Spent in minutes   25  minute   Graclyn Lawther M.D on 04/01/2014 at 9:29 AM  Between 7am to 7pm - Pager - [phone]  After 7pm go to [email or web address] - password [email or web address]  And look for the night coverage person covering for me after hours  [facility] Office  [phone]   **Disclaimer: This note may have been dictated with voice recognition software. Similar sounding words can inadvertently be transcribed and this note may contain transcription errors which may not have been corrected upon publication of note.**

## 2014-04-01 NOTE — Progress Notes (Signed)
Subjective: No CP since lunch time yesterday.    Objective: Vital signs in last 24 hours: Temp:  [98 F (36.7 C)-98.3 F (36.8 C)] 98 F (36.7 C) (09/06 0536) Pulse Rate:  [63-70] 66 (09/06 0857) Resp:  [18-22] 18 (09/06 0536) BP: (98-202)/(63-103) 194/100 mmHg (09/06 0857) SpO2:  [96 %-99 %] 99 % (09/06 0536) Weight:  [273 lb 3.2 oz (123.923 kg)-275 lb 5.7 oz (124.9 kg)] 273 lb 3.2 oz (123.923 kg) (09/06 0536) Last BM Date: 03/30/14  Intake/Output from previous day: 09/05 0701 - 09/06 0700 In: 600 [P.O.:600] Out: 2550 [Urine:2550] Intake/Output this shift:    Medications Current Facility-Administered Medications  Medication Dose Route Frequency Provider Last Rate Last Dose  . acetaminophen (TYLENOL) tablet 650 mg  650 mg Oral Q4H PRN Quintella Reichert, MD   650 mg at 03/31/14 1651  . aspirin EC tablet 81 mg  81 mg Oral Daily Quintella Reichert, MD   81 mg at 03/31/14 0956  . carvedilol (COREG) tablet 12.5 mg  12.5 mg Oral BID WC Hillis Range, MD   12.5 mg at 03/31/14 1622  . heparin injection 5,000 Units  5,000 Units Subcutaneous 3 times per day Quintella Reichert, MD   5,000 Units at 04/01/14 0543  . HYDROcodone-acetaminophen (NORCO/VICODIN) 5-325 MG per tablet 1-2 tablet  1-2 tablet Oral Q6H PRN Leeann Must, MD   1 tablet at 03/31/14 [phone]  . insulin aspart (novoLOG) injection 0-5 Units  0-5 Units Subcutaneous QHS Leeann Must, MD   3 Units at 03/31/14 2154  . insulin aspart (novoLOG) injection 0-9 Units  0-9 Units Subcutaneous TID WC Leeann Must, MD   5 Units at 03/31/14 1622  . insulin detemir (LEVEMIR) injection 10 Units  10 Units Subcutaneous Once Huey Bienenstock, MD      . insulin detemir (LEVEMIR) injection 50 Units  50 Units Subcutaneous QHS Dawood Elgergawy, MD      . losartan (COZAAR) tablet 50 mg  50 mg Oral Daily Duke Salvia, MD      . nitroGLYCERIN (NITROSTAT) SL tablet 0.4 mg  0.4 mg Sublingual Q5 Min x 3 PRN Quintella Reichert, MD      . ondansetron (ZOFRAN) injection 4  mg  4 mg Intravenous Q6H PRN Quintella Reichert, MD      . regadenoson (LEXISCAN) 0.4 MG/5ML injection SOLN           . triamterene-hydrochlorothiazide (MAXZIDE-25) 37.5-25 MG per tablet 1 tablet  1 tablet Oral Daily Hillis Range, MD   1 tablet at 03/31/14 1401    PE: General appearance: alert, cooperative and no distress Lungs: clear to auscultation bilaterally Heart: regular rate and rhythm, S1, S2 normal, no murmur, click, rub or gallop Extremities: No LEE Pulses: 2+ and symmetric Skin: Warm and dry Neurologic: Grossly normal  Lab Results:   Recent Labs  03/30/14 1241 03/31/14 0247  WBC 6.1 6.7  HGB 12.2 11.1*  HCT 36.6 33.8*  PLT 221 183   BMET  Recent Labs  03/30/14 1241 03/31/14 0247  NA 136* 133*  K 4.2 4.2  CL 99 97  CO2 25 25  GLUCOSE 228* 319*  BUN 19 25*  CREATININE 0.98 1.13*  CALCIUM 9.3 8.8   PT/INR  Recent Labs  03/30/14 1241  LABPROT 12.8  INR 0.96    Assessment/Plan  Principal Problem:   Chest pain Active Problems:   Type II diabetes mellitus with renal manifestations, uncontrolled   HYPERTENSION  Plan:  Ruled  out for MI.  Lexiscan completed.  Results to follow.  BP poorly controlled.   IV hydralazine given in nuc med.  Will add norvasc.   LOS: 2 days    Wilburt Finlay PA-C 04/01/2014 9:23 AM Dr. Graciela Husbands may also be seeing patient. I will touch base with him.

## 2014-04-01 NOTE — Progress Notes (Signed)
Patient Name: Lori Mclaughlin   SUBJECTIVE:  mobidly obese, poorly controlled HTN and DM  admitted with chest pain with concerning features.  She has been undergoing 2 day myoview protocol  She also had HFpEF Rx w edema  She is much improved without edema, ambulating without dypsnea  No chest pain  Past Medical History  Diagnosis Date  . Allergic rhinitis   . Anemia   . Asthma   . Diabetes mellitus type II   . GERD (gastroesophageal reflux disease)   . Hypertension   . Osteoarthritis   . Headache(784.0)     when my bp is up    Scheduled Meds:  Scheduled Meds: . aspirin EC  81 mg Oral Daily  . carvedilol  12.5 mg Oral BID WC  . heparin  5,000 Units Subcutaneous 3 times per day  . insulin aspart  0-5 Units Subcutaneous QHS  . insulin aspart  0-9 Units Subcutaneous TID WC  . insulin detemir  10 Units Subcutaneous Once  . insulin detemir  50 Units Subcutaneous QHS  . regadenoson      . triamterene-hydrochlorothiazide  1 tablet Oral Daily   Continuous Infusions:  acetaminophen, HYDROcodone-acetaminophen, nitroGLYCERIN, ondansetron (ZOFRAN) IV    PHYSICAL EXAM Filed Vitals:   03/31/14 1842 03/31/14 1900 03/31/14 2009 04/01/14 0536  BP: 162/78  155/63 160/66  Pulse: 66  63 65  Temp:  98.3 F (36.8 C) 98.3 F (36.8 C) 98 F (36.7 C)  TempSrc:  Oral Oral Oral  Resp:   18 18  Height:      Weight:    273 lb 3.2 oz (123.923 kg)  SpO2:   96% 99%   Well developed and nourished in no acute distress HENT normal Neck supple with JVP??? Clear Regular rate and rhythm, no murmurs or gallops Abd-soft with active BS No Clubbing cyanosis edema Skin-warm and dry A & Oriented  Grossly normal sensory and motor function  TELEMETRY: Reviewed telemetry pt in **NSR   Intake/Output Summary (Last 24 hours) at 04/01/14 0850 Last data filed at 04/01/14 0539  Gross per 24 hour  Intake    600 ml  Output   2250 ml  Net  -1650 ml    LABS: Basic Metabolic  Panel:  Recent Labs Lab 03/26/14 1353 03/30/14 1241 03/31/14 0247  NA 135* 136* 133*  K 4.2 4.2 4.2  CL 97 99 97  CO2 GLUCOSE 382* 228* 319*  BUN 19 19 25*  CREATININE 1.00 0.98 1.13*  CALCIUM 9.0 9.3 8.8  MG  --  1.6  --    Cardiac Enzymes:  Recent Labs  03/30/14 1241 03/30/14 1640 03/30/14 2345  TROPONINI <0.30 <0.30 <0.30   CBC:  Recent Labs Lab 03/26/14 1353 03/30/14 1241 03/31/14 0247  WBC 6.8 6.1 6.7  NEUTROABS  --  3.6  --   HGB 11.6* 12.2 11.1*  HCT 35.4* 36.6 33.8*  MCV 72.5* 72.3* 73.5*  PLT 191 221 183   PROTIME:  Recent Labs  03/30/14 1241  LABPROT 12.8  INR 0.96   Liver Function Tests:  Recent Labs  03/30/14 1241  AST 15  ALT 13  ALKPHOS 99  BILITOT 0.3  PROT 8.1  ALBUMIN 3.4*   No results found for this basename: LIPASE, AMYLASE,  in the last 72 hours BNP: BNP (last 3 results)  Recent Labs  03/26/14 1353 03/30/14 1241  PROBNP 571.3* 375.4*   D-Dimer: No results found  for this basename: DDIMER,  in the last 72 hours Hemoglobin A1C:  Recent Labs  03/30/14 1241  HGBA1C 11.6*   Fasting Lipid Panel: No results found for this basename: CHOL, HDL, LDLCALC, TRIG, CHOLHDL, LDLDIRECT,  in the last 72 hours Thyroid Function Tests:  Recent Labs  03/30/14 1241  TSH 2.050   Anemia Panel: No results found for this basename: VITAMINB12, FOLATE, FERRITIN, TIBC, IRON, RETICCTPCT,  in the last 72 hours   ASSESSMENT AND PLAN:  Principal Problem:   Chest pain Active Problems:   Type II diabetes mellitus with renal manifestations, uncontrolled   HYPERTENSION  Will add losartan Check Hgb A1c Microcytosis >> check ferritin level  If myoview negative will discharge home today With early followup with PCP for BP management  Signed, Sherryl Manges MD  04/01/2014

## 2014-04-03 NOTE — ED Provider Notes (Signed)
I saw and evaluated the patient, reviewed the resident's note and I agree with the findings and plan.   EKG Interpretation   Date/Time:  Monday March 26 2014 13:45:39 EDT Ventricular Rate:  81 PR Interval:  148 QRS Duration: 88 QT Interval:  408 QTC Calculation: 474 R Axis:   -29 Text Interpretation:  Sinus rhythm Left axis deviation Nonspecific ST  abnormality Confirmed by Denton Lank  MD, Caryn Bee ([id number]) on 03/26/2014 1:56:25 PM      Pt c/o mid chest pain. Dull. Moderate. No sob or diaphoresis. Labs. Monitor. Ecg. Chest cta. Rrr.  Plan for delta troponin and recheck.    Suzi Roots, MD 04/03/14 [phone]

## 2014-04-04 ENCOUNTER — Telehealth: Payer: Self-pay | Admitting: Cardiology

## 2014-04-04 NOTE — Telephone Encounter (Signed)
She needs to come in for BP check in the am 9/10

## 2014-04-04 NOTE — Telephone Encounter (Signed)
New message     Pt was released from the hosp Sunday because of high bp.  The nurse checked it at work just now and it was 240/110.  Please advise

## 2014-04-04 NOTE — Telephone Encounter (Signed)
Contacted pt to ask her, her BP results from today and pt states that she went to work and the nurse there checked her BP and stated that it was 240/110.  Pt states she had no complaints at all when the nurse took it.  Pt states she does have HTN and has recently been in the hospital for this.  Pt denies any cp, sob, palpitations, feeling dizzy, visual disturbances, HA, or any stroke like symptoms. Pt states she is being compliant with taking her meds.  Pt states "I think the nurse at work took my BP incorrectly, for she used the wrong sized cuff." Pt states that she is going to the fire dept later this evening to have it checked again, when her daughter returns with her car.  Pt states she feels fine, and verbalized that if she at all felt like her BP needed immediate attention, then she would call EMS.  Advised pt to continue with current med regimen, maintain low sodium diet, monitor her BP and report to our office in the morning, stay plenty hydrated, and seek med attention if she experiences any visual disturbances, HA, nose bleed, dizziness, feels faint, or any stroke like symptoms. Informed pt that I will further notify Dr Mayford Knife and CMA of this, and will follow-up with her of any recommendations.  Pt verbalized understanding and agrees with this plan.

## 2014-04-05 NOTE — Telephone Encounter (Signed)
PT stated she went to Fire station last night and this morning and they checked her BP for her.   Last night 9/9 BP 155/88 This Morning 9/10 BP 142/80

## 2014-04-06 ENCOUNTER — Other Ambulatory Visit (INDEPENDENT_AMBULATORY_CARE_PROVIDER_SITE_OTHER): Payer: PRIVATE HEALTH INSURANCE

## 2014-04-06 DIAGNOSIS — N182 Chronic kidney disease, stage 2 (mild): Secondary | ICD-10-CM

## 2014-04-06 LAB — BASIC METABOLIC PANEL
BUN: 19 mg/dL (ref 6–23)
CALCIUM: 9 mg/dL (ref 8.4–10.5)
CO2: 26 mEq/L (ref 19–32)
Chloride: 103 mEq/L (ref 96–112)
Creatinine, Ser: 1.1 mg/dL (ref 0.4–1.2)
GFR: 57.21 mL/min — AB (ref >60.00)
GLUCOSE: 148 mg/dL — AB (ref 70–99)
Potassium: 3.9 mEq/L (ref 3.5–5.1)
SODIUM: 136 meq/L (ref 135–145)

## 2014-04-06 NOTE — Telephone Encounter (Signed)
Please instruct her to buy a BP cuff and check BP twice daily for a week and call with results

## 2014-04-09 NOTE — Telephone Encounter (Signed)
Spoke with pt she agreed to plan adn will also bring all BP readings to her appt on Weds that is already scheduled.

## 2014-04-11 ENCOUNTER — Ambulatory Visit (INDEPENDENT_AMBULATORY_CARE_PROVIDER_SITE_OTHER): Payer: PRIVATE HEALTH INSURANCE | Admitting: Cardiology

## 2014-04-11 ENCOUNTER — Encounter: Payer: Self-pay | Admitting: Cardiology

## 2014-04-11 VITALS — BP 146/98 | HR 46 | Ht 61.0 in | Wt 277.6 lb

## 2014-04-11 DIAGNOSIS — IMO0002 Reserved for concepts with insufficient information to code with codable children: Secondary | ICD-10-CM

## 2014-04-11 DIAGNOSIS — E1165 Type 2 diabetes mellitus with hyperglycemia: Secondary | ICD-10-CM

## 2014-04-11 DIAGNOSIS — T50905A Adverse effect of unspecified drugs, medicaments and biological substances, initial encounter: Secondary | ICD-10-CM | POA: Insufficient documentation

## 2014-04-11 DIAGNOSIS — R001 Bradycardia, unspecified: Secondary | ICD-10-CM | POA: Insufficient documentation

## 2014-04-11 DIAGNOSIS — I10 Essential (primary) hypertension: Secondary | ICD-10-CM

## 2014-04-11 DIAGNOSIS — I498 Other specified cardiac arrhythmias: Secondary | ICD-10-CM

## 2014-04-11 DIAGNOSIS — G4710 Hypersomnia, unspecified: Secondary | ICD-10-CM

## 2014-04-11 DIAGNOSIS — R079 Chest pain, unspecified: Secondary | ICD-10-CM

## 2014-04-11 DIAGNOSIS — E1129 Type 2 diabetes mellitus with other diabetic kidney complication: Secondary | ICD-10-CM

## 2014-04-11 DIAGNOSIS — I5032 Chronic diastolic (congestive) heart failure: Secondary | ICD-10-CM

## 2014-04-11 MED ORDER — AMLODIPINE BESYLATE 5 MG PO TABS
5.0000 mg | ORAL_TABLET | Freq: Every day | ORAL | Status: DC
Start: 2014-04-11 — End: 2014-11-27

## 2014-04-11 NOTE — Progress Notes (Addendum)
[address] [place], [place]  [address] Phone: [phone] Fax:  [phone]  Date:  04/11/2014   ID:  Lori Mclaughlin, DOB [birth year], MRN [id number]  PCP:  Sanda Linger, MD  Cardiologist:  Armanda Magic, MD     History of Present Illness: Lori Mclaughlin is a 53 y.o. female with a history of HTN and type II DM who presents today for followup of CP.  I saw her recently for workup of CP and SOB and her BP was markedly elevated and she was admitted to [facility] for further w/u.  She was admitted with acute diastolic CHF and diuresed. 2D echo showed normal LVF with grade I diastolic CHF.  She ruled out for MI.  Her metoprolol was changed to Coreg.  She was started on Losartan as well.  Nuclear stress test showed no ischemia.  She now presents today for followup.  She says that she has been feeling bad on the Losartan.  She has deduced by process of elimination that it is the losartan because she takes it in the am and has a similar feeling to when she took lisinopril.  Every morning she feels bad with chest tightness, nausea and dizziness that starts about 1 hour after taking Losartan and lasts about 30 minutes after.  She does not have chest tightness otherwise but still has SOB when she exerts herself and sometimes has a feeling of heaviness in her legs.  She also has severe snoring and has been very sleepy.  She has had to sleep during the day in the past interrupting her work schedule although this seems to have improved since she got out of the hospital.  Wt Readings from Last 3 Encounters:  04/11/14 277 lb 9.6 oz (125.919 kg)  04/01/14 273 lb 3.2 oz (123.923 kg)  03/30/14 275 lb (124.739 kg)     Past Medical History  Diagnosis Date  . Allergic rhinitis   . Microcytic anemia   . Asthma   . Diabetes mellitus type II   . GERD (gastroesophageal reflux disease)   . Hypertension   . Osteoarthritis   . Headache(784.0)     when my bp is up  . CKD (chronic kidney  disease), stage II   . Atypical chest pain     a. 03/2014: normal nuclear stress test.  . Hypertensive heart disease   . Chronic diastolic CHF (congestive heart failure)     a. Dx 03/2014 with echo - moderate LVH, EF 55-60%, grade 1 diastolic dysfunction, mildly dilated LA.    Current Outpatient Prescriptions  Medication Sig Dispense Refill  . aspirin EC 81 MG EC tablet Take 1 tablet (81 mg total) by mouth daily.      . carvedilol (COREG) 12.5 MG tablet Take 1 tablet (12.5 mg total) by mouth 2 (two) times daily with a meal.  60 tablet  6  . insulin detemir (LEVEMIR FLEXPEN) 100 UNIT/ML injection Inject 50 Units into the skin at bedtime.  3 mL  11  . losartan (COZAAR) 50 MG tablet Take 1 tablet (50 mg total) by mouth daily.  30 tablet  6  . Multiple Vitamins-Minerals (MULTIVITAMIN WITH MINERALS) tablet Take 1 tablet by mouth daily.      . sitaGLIPtan-metformin (JANUMET) 50-1000 MG per tablet Take 1 tablet by mouth 2 (two) times daily with a meal.  180 tablet  1  . triamterene-hydrochlorothiazide (MAXZIDE-25) 37.5-25 MG per tablet Take 1 tablet by mouth daily.  90 tablet  1   No current facility-administered medications for this visit.    Allergies:    Allergies  Allergen Reactions  . Latex     REACTION: itching,rash  . Lisinopril     REACTION: headache    Social History:  The patient  reports that she quit smoking about 3 years ago. She has never used smokeless tobacco. She reports that she does not drink alcohol or use illicit drugs.   Family History:  The patient's family history includes Cancer in her father and other; Diabetes in her other; Hypertension in her brother and other; Stroke in her mother and other. There is no history of Kidney disease, Alcohol abuse, Asthma, COPD, Depression, Drug abuse, Early death, Hearing loss, Heart disease, or Hyperlipidemia.   ROS:  Please see the history of present illness.      All other systems reviewed and negative.   PHYSICAL EXAM: VS:   BP 146/98  Pulse 46  Ht  (1.549 m)  Wt 277 lb 9.6 oz (125.919 kg)  BMI 52.48 kg/m2  LMP 02/23/2012 Well nourished, well developed, in no acute distress HEENT: normal Neck: no JVD Cardiac:  normal S1, S2; RRR; no murmur Lungs:  clear to auscultation bilaterally, no wheezing, rhonchi or rales Abd: soft, nontender, no hepatomegaly Ext: no edema Skin: warm and dry Neuro:  CNs 2-12 intact, no focal abnormalities noted  EKG:   Sinus bradycardia at 56bpm with lateral T wave abnormality  ASSESSMENT AND PLAN:  1. Chronic diastolic CHF - appears euvolemic - continue BB and Maxide 2. HTN - still poorly controlled - she has not taken her Losartan this am - Continue Coreg (cannot increase further due to resting brady) - continue maxide - stop Losartan since she is having side effects - start Amlodipine  daily - check BP daily for a week and call with results 3. Chest pain secondary to #1 and 2 - nuclear stress test with no ischemia - most likely secondary to demand ischemia from poorly controlled HTN 4. CKD stage II 5. Type II DM - followup with PCP 6. Hypersomnia with severe snoring - will set her up for sleep study especially in light of difficult to control HTN  7. Morbid obesity - encouraged her to get into an exercise routine  Followup with me in 4 weeks  Signed, Armanda Magic, MD 04/11/2014 10:29 AM

## 2014-04-11 NOTE — Patient Instructions (Addendum)
**Note De-Identified Lori Mclaughlin Obfuscation** Your physician has recommended you make the following change in your medication: stop taking Losartan and start taking Amlodipine 5 mg daily  Your physician has recommended that you have a sleep study. This test records several body functions during sleep, including: brain activity, eye movement, oxygen and carbon dioxide blood levels, heart rate and rhythm, breathing rate and rhythm, the flow of air through your mouth and nose, snoring, body muscle movements, and chest and belly movement.  Dr Mayford Knife wants you to monitor your blood pressure twice daily for 2 weeks then call office at [phone] to report your readings.  Your physician wants you to follow-up in: 4 weeks. You will receive a reminder letter in the mail two months in advance. If you don't receive a letter, please call our office to schedule the follow-up appointment.

## 2014-04-20 ENCOUNTER — Institutional Professional Consult (permissible substitution): Payer: PRIVATE HEALTH INSURANCE | Admitting: Cardiology

## 2014-05-14 ENCOUNTER — Ambulatory Visit: Payer: PRIVATE HEALTH INSURANCE | Admitting: Cardiology

## 2014-05-29 ENCOUNTER — Ambulatory Visit (HOSPITAL_BASED_OUTPATIENT_CLINIC_OR_DEPARTMENT_OTHER): Payer: PRIVATE HEALTH INSURANCE | Attending: Cardiology

## 2014-07-06 ENCOUNTER — Telehealth: Payer: Self-pay | Admitting: Internal Medicine

## 2014-07-06 NOTE — Telephone Encounter (Signed)
Pt requesting samples of Janumet and Levemil.

## 2014-07-07 NOTE — Telephone Encounter (Signed)
Ok with me 

## 2014-07-17 ENCOUNTER — Ambulatory Visit: Payer: PRIVATE HEALTH INSURANCE | Admitting: Internal Medicine

## 2014-09-12 ENCOUNTER — Telehealth: Payer: Self-pay | Admitting: Internal Medicine

## 2014-09-12 DIAGNOSIS — E1129 Type 2 diabetes mellitus with other diabetic kidney complication: Secondary | ICD-10-CM

## 2014-09-12 DIAGNOSIS — E1165 Type 2 diabetes mellitus with hyperglycemia: Principal | ICD-10-CM

## 2014-09-12 DIAGNOSIS — IMO0002 Reserved for concepts with insufficient information to code with codable children: Secondary | ICD-10-CM

## 2014-09-12 MED ORDER — INSULIN DETEMIR 100 UNIT/ML ~~LOC~~ SOLN: 50.0000 [IU] | Freq: Every day | SUBCUTANEOUS | Status: DC

## 2014-09-12 MED ORDER — SITAGLIPTIN PHOS-METFORMIN HCL 50-1000 MG PO TABS: 1.0000 | ORAL_TABLET | Freq: Two times a day (BID) | ORAL | Status: DC

## 2014-09-12 NOTE — Telephone Encounter (Signed)
Patient is requesting samples of janumet and a script for janumet and levemir flex pen.  Patient now has insurance.

## 2014-09-12 NOTE — Telephone Encounter (Signed)
RX approved and sent to pharmacy

## 2014-09-24 ENCOUNTER — Telehealth: Payer: Self-pay | Admitting: *Deleted

## 2014-09-24 NOTE — Telephone Encounter (Signed)
Does she has a formulary for her insurance?

## 2014-09-24 NOTE — Telephone Encounter (Signed)
Left msg on triage stating the two medicine that was sent in last week (Janumet & Levemir) is too expensive both medication together cost over $1400 and can't afford. Requesting md to rx something else...Raechel Chute/lmb

## 2014-09-24 NOTE — Telephone Encounter (Signed)
Called pt no answer LMOM with md response.../lmb 

## 2014-11-08 ENCOUNTER — Telehealth: Payer: Self-pay | Admitting: Internal Medicine

## 2014-11-08 NOTE — Telephone Encounter (Signed)
Pt called in and said that she needs to know if she could get samples of the  insulin detemir (LEVEMIR) 100 UNIT/ML injection [[id number]][[id number]] and the Janumet  She said that she just cant get her meds until she get the ins straighten out and she has been without it for a while.

## 2014-11-09 NOTE — Telephone Encounter (Signed)
Patient notified that we do not have these samples and advised that she will need to find out what he insurance will cover.

## 2014-11-27 ENCOUNTER — Emergency Department (HOSPITAL_COMMUNITY)
Admission: EM | Admit: 2014-11-27 | Discharge: 2014-11-27 | Disposition: A | Discharge status: Home / Self Care | Payer: PRIVATE HEALTH INSURANCE | Attending: Emergency Medicine | Admitting: Emergency Medicine

## 2014-11-27 ENCOUNTER — Emergency Department (HOSPITAL_COMMUNITY): Payer: PRIVATE HEALTH INSURANCE

## 2014-11-27 ENCOUNTER — Encounter (HOSPITAL_COMMUNITY): Payer: Self-pay | Admitting: Emergency Medicine

## 2014-11-27 ENCOUNTER — Telehealth: Payer: Self-pay

## 2014-11-27 DIAGNOSIS — I129 Hypertensive chronic kidney disease with stage 1 through stage 4 chronic kidney disease, or unspecified chronic kidney disease: Secondary | ICD-10-CM | POA: Diagnosis not present

## 2014-11-27 DIAGNOSIS — Z7982 Long term (current) use of aspirin: Secondary | ICD-10-CM | POA: Insufficient documentation

## 2014-11-27 DIAGNOSIS — Z87891 Personal history of nicotine dependence: Secondary | ICD-10-CM | POA: Diagnosis not present

## 2014-11-27 DIAGNOSIS — Z8719 Personal history of other diseases of the digestive system: Secondary | ICD-10-CM | POA: Diagnosis not present

## 2014-11-27 DIAGNOSIS — J45909 Unspecified asthma, uncomplicated: Secondary | ICD-10-CM | POA: Insufficient documentation

## 2014-11-27 DIAGNOSIS — Z79899 Other long term (current) drug therapy: Secondary | ICD-10-CM | POA: Insufficient documentation

## 2014-11-27 DIAGNOSIS — R42 Dizziness and giddiness: Secondary | ICD-10-CM | POA: Insufficient documentation

## 2014-11-27 DIAGNOSIS — Z91040 Latex allergy status: Secondary | ICD-10-CM | POA: Insufficient documentation

## 2014-11-27 DIAGNOSIS — R110 Nausea: Secondary | ICD-10-CM | POA: Diagnosis not present

## 2014-11-27 DIAGNOSIS — N182 Chronic kidney disease, stage 2 (mild): Secondary | ICD-10-CM | POA: Insufficient documentation

## 2014-11-27 DIAGNOSIS — E119 Type 2 diabetes mellitus without complications: Secondary | ICD-10-CM | POA: Insufficient documentation

## 2014-11-27 DIAGNOSIS — M542 Cervicalgia: Secondary | ICD-10-CM | POA: Insufficient documentation

## 2014-11-27 DIAGNOSIS — I10 Essential (primary) hypertension: Secondary | ICD-10-CM

## 2014-11-27 DIAGNOSIS — I5032 Chronic diastolic (congestive) heart failure: Secondary | ICD-10-CM | POA: Diagnosis not present

## 2014-11-27 DIAGNOSIS — Z862 Personal history of diseases of the blood and blood-forming organs and certain disorders involving the immune mechanism: Secondary | ICD-10-CM | POA: Insufficient documentation

## 2014-11-27 DIAGNOSIS — M1990 Unspecified osteoarthritis, unspecified site: Secondary | ICD-10-CM | POA: Diagnosis not present

## 2014-11-27 DIAGNOSIS — I639 Cerebral infarction, unspecified: Secondary | ICD-10-CM

## 2014-11-27 DIAGNOSIS — Z794 Long term (current) use of insulin: Secondary | ICD-10-CM | POA: Diagnosis not present

## 2014-11-27 LAB — COMPREHENSIVE METABOLIC PANEL
ALBUMIN: 3.6 g/dL (ref 3.5–5.0)
ALK PHOS: 119 U/L (ref 38–126)
ALT: 15 U/L (ref 14–54)
AST: 16 U/L (ref 15–41)
Anion gap: 7 (ref 5–15)
BUN: 16 mg/dL (ref 6–20)
CALCIUM: 8.9 mg/dL (ref 8.9–10.3)
CO2: 25 mmol/L (ref 22–32)
Chloride: 101 mmol/L (ref 101–111)
Creatinine, Ser: 1.02 mg/dL — ABNORMAL HIGH (ref 0.44–1.00)
GFR calc Af Amer: >60 mL/min (ref >60)
GFR calc non Af Amer: >60 mL/min (ref >60)
Glucose, Bld: 425 mg/dL — ABNORMAL HIGH (ref 70–99)
Potassium: 4.2 mmol/L (ref 3.5–5.1)
SODIUM: 133 mmol/L — AB (ref 135–145)
TOTAL PROTEIN: 7.8 g/dL (ref 6.5–8.1)
Total Bilirubin: 0.5 mg/dL (ref 0.3–1.2)

## 2014-11-27 LAB — CBC WITH DIFFERENTIAL/PLATELET
Basophils Absolute: 0 10*3/uL (ref 0.0–0.1)
Basophils Relative: 0 % (ref 0–1)
EOS PCT: 4 % (ref 0–5)
Eosinophils Absolute: 0.2 10*3/uL (ref 0.0–0.7)
HCT: 39.6 % (ref 36.0–46.0)
Hemoglobin: 12.8 g/dL (ref 12.0–15.0)
LYMPHS ABS: 1.7 10*3/uL (ref 0.7–4.0)
Lymphocytes Relative: 28 % (ref 12–46)
MCH: 23.5 pg — AB (ref 26.0–34.0)
MCHC: 32.3 g/dL (ref 30.0–36.0)
MCV: 72.8 fL — AB (ref 78.0–100.0)
MONOS PCT: 8 % (ref 3–12)
Monocytes Absolute: 0.5 10*3/uL (ref 0.1–1.0)
NEUTROS PCT: 61 % (ref 43–77)
Neutro Abs: 3.7 10*3/uL (ref 1.7–7.7)
Platelets: 183 10*3/uL (ref 150–400)
RBC: 5.44 MIL/uL — AB (ref 3.87–5.11)
RDW: 15.6 % — ABNORMAL HIGH (ref 11.5–15.5)
WBC: 6.1 10*3/uL (ref 4.0–10.5)

## 2014-11-27 LAB — CBG MONITORING, ED
GLUCOSE-CAPILLARY: 388 mg/dL — AB (ref 70–99)
Glucose-Capillary: 320 mg/dL — ABNORMAL HIGH (ref 70–99)

## 2014-11-27 LAB — TROPONIN I: Troponin I: 0.03 ng/mL (ref <0.031)

## 2014-11-27 MED ORDER — INSULIN DETEMIR 100 UNIT/ML ~~LOC~~ SOLN: 50.0000 [IU] | Freq: Every day | SUBCUTANEOUS | Status: DC

## 2014-11-27 MED ORDER — CARVEDILOL 6.25 MG PO TABS
6.2500 mg | ORAL_TABLET | Freq: Two times a day (BID) | ORAL | Status: DC
  Filled 2014-11-27 (×2): qty 1

## 2014-11-27 MED ORDER — AMLODIPINE BESYLATE 5 MG PO TABS
5.0000 mg | ORAL_TABLET | Freq: Once | ORAL | Status: AC
  Administered 2014-11-27: 5 mg via ORAL
  Filled 2014-11-27 (×2): qty 1

## 2014-11-27 MED ORDER — GADOBENATE DIMEGLUMINE 529 MG/ML IV SOLN
20.0000 mL | Freq: Once | INTRAVENOUS | Status: AC | PRN
  Administered 2014-11-27: 20 mL via INTRAVENOUS

## 2014-11-27 MED ORDER — INSULIN ASPART 100 UNIT/ML ~~LOC~~ SOLN
10.0000 [IU] | Freq: Once | SUBCUTANEOUS | Status: AC
  Administered 2014-11-27: 10 [IU] via SUBCUTANEOUS
  Filled 2014-11-27: qty 1

## 2014-11-27 MED ORDER — AMLODIPINE BESYLATE 5 MG PO TABS: 5.0000 mg | ORAL_TABLET | Freq: Every day | ORAL | Status: DC

## 2014-11-27 MED ORDER — HYDRALAZINE HCL 20 MG/ML IJ SOLN
10.0000 mg | Freq: Once | INTRAMUSCULAR | Status: AC
  Administered 2014-11-27: 10 mg via INTRAVENOUS
  Filled 2014-11-27: qty 1

## 2014-11-27 MED ORDER — ONDANSETRON HCL 4 MG/2ML IJ SOLN
4.0000 mg | Freq: Once | INTRAMUSCULAR | Status: AC
  Administered 2014-11-27: 4 mg via INTRAVENOUS
  Filled 2014-11-27: qty 2

## 2014-11-27 MED ORDER — MECLIZINE HCL 25 MG PO TABS: 25.0000 mg | ORAL_TABLET | Freq: Four times a day (QID) | ORAL | Status: DC

## 2014-11-27 MED ORDER — CARVEDILOL 6.25 MG PO TABS: 6.2500 mg | ORAL_TABLET | Freq: Two times a day (BID) | ORAL | Status: DC

## 2014-11-27 MED ORDER — LABETALOL HCL 5 MG/ML IV SOLN
20.0000 mg | INTRAVENOUS | Status: DC | PRN
  Administered 2014-11-27 (×2): 20 mg via INTRAVENOUS
  Filled 2014-11-27 (×2): qty 4

## 2014-11-27 MED ORDER — MECLIZINE HCL 25 MG PO TABS
25.0000 mg | ORAL_TABLET | Freq: Once | ORAL | Status: AC
  Administered 2014-11-27: 25 mg via ORAL
  Filled 2014-11-27: qty 1

## 2014-11-27 MED ORDER — DIAZEPAM 5 MG/ML IJ SOLN
5.0000 mg | Freq: Once | INTRAMUSCULAR | Status: AC
  Administered 2014-11-27: 5 mg via INTRAVENOUS
  Filled 2014-11-27: qty 2

## 2014-11-27 MED ORDER — SITAGLIPTIN PHOS-METFORMIN HCL 50-1000 MG PO TABS: 1.0000 | ORAL_TABLET | Freq: Two times a day (BID) | ORAL | Status: DC

## 2014-11-27 MED ORDER — TRIAMTERENE-HCTZ 37.5-25 MG PO TABS: 1.0000 | ORAL_TABLET | Freq: Every day | ORAL | Status: DC

## 2014-11-27 MED ORDER — SODIUM CHLORIDE 0.9 % IV BOLUS (SEPSIS)
1000.0000 mL | Freq: Once | INTRAVENOUS | Status: AC
  Administered 2014-11-27: 1000 mL via INTRAVENOUS

## 2014-11-27 NOTE — Progress Notes (Addendum)
54 yr old female dizziness, nausea, and headache since Saturday. Pt continues to report OUT OF BP AND DIABETIC MEDIATION FOR MONTHS per ED RN note EPIC reviewed, CM spoke with ED registration, verified pt with [employer] insurance coverage therefore [facility] is [facility] Cm went to see pt but ED RN states best for CM to return to see pt later, Pt given valium and would not be a good historian at this time. 2 females at bedside  1230 spoke with pt about goodrx.com options to decrease med cost and her not being eligible at this time for [facility] program Pt voiced understanding.  Pt shown how to use goodrx and given discount coupons. Pt, daughter and CM called [facility] to get new medication added Lorsartan 50 mg and Fluconazole.  Discussed affordable care act contact for another offer of insurance coverage option. Answered insurance questions.  Discussed options of taking Januvia + metformin for Janumet but [place] cost remains $371.04 by itself. Pt states she was previously on glipizide and glucophage that was changed to Janumet already.  Most expensive meds for pt [facility] and levemir ($221.34 per vial, $330.34 per carton and 1 flexipen at $73.78). Cm reviewed goodrx, needymeds and [place] med assist Pt confirms she does not have an endocrinologist Reports a past admission for glucose >600 1301 spoke with [facility]ty at [facility] [address] (now pt preferred pharmacy) to inquire about cost effeciency alternatives to levemir Kathie RhodesBetty confirmed pt would not be able to get a single pen and there is not a [facility] brand for levemir. Recommended samples from pcp office  1305 called pcp [phone] to inquire about assistance with levemir, janumet Spoke with Alvy BealLakisha, RN who states she has assisted samples previous, discussed drug company programs and confirmed pt has not been seen since March 2015 by Dr Yetta BarreJones.  Alvy BealLakisha states Dr Yetta BarreJones will help pt as much as possible   [id number] Spoke with Dr Fayrene FearingJames about CM interventions and outcomes of resources offered  1355 Pt given coupons, lists of discount pharmacies, Levemir & Janumet pt assistance program applications plus discount voucher for janumet for $25 and information/website to access to get to a discount voucher for Levemir (needs pt personal information)

## 2014-11-27 NOTE — ED Notes (Addendum)
Pt reports dizziness, nausea, and headache since Saturday. Pt denies other symptoms. Pt continues to report OUT OF BP AND DIABETIC MEDIATION FOR MONTHS.

## 2014-11-27 NOTE — ED Notes (Signed)
Called Selena BattenKim, case manager, for medication assistance-per Selena BattenKim, she will speak with patient

## 2014-11-27 NOTE — Discharge Instructions (Signed)
Benign Positional Vertigo Vertigo means you feel like you or your surroundings are moving when they are not. Benign positional vertigo is the most common form of vertigo. Benign means that the cause of your condition is not serious. Benign positional vertigo is more common in older adults. CAUSES  Benign positional vertigo is the result of an upset in the labyrinth system. This is an area in the middle ear that helps control your balance. This may be caused by a viral infection, head injury, or repetitive motion. However, often no specific cause is found. SYMPTOMS  Symptoms of benign positional vertigo occur when you move your head or eyes in different directions. Some of the symptoms may include:  Loss of balance and falls.  Vomiting.  Blurred vision.  Dizziness.  Nausea.  Involuntary eye movements (nystagmus). DIAGNOSIS  Benign positional vertigo is usually diagnosed by physical exam. If the specific cause of your benign positional vertigo is unknown, your caregiver may perform imaging tests, such as magnetic resonance imaging (MRI) or computed tomography (CT). TREATMENT  Your caregiver may recommend movements or procedures to correct the benign positional vertigo. Medicines such as meclizine, benzodiazepines, and medicines for nausea may be used to treat your symptoms. In rare cases, if your symptoms are caused by certain conditions that affect the inner ear, you may need surgery. HOME CARE INSTRUCTIONS   Follow your caregiver's instructions.  Move slowly. Do not make sudden body or head movements.  Avoid driving.  Avoid operating heavy machinery.  Avoid performing any tasks that would be dangerous to you or others during a vertigo episode.  Drink enough fluids to keep your urine clear or pale yellow. SEEK IMMEDIATE MEDICAL CARE IF:   You develop problems with walking, weakness, numbness, or using your arms, hands, or legs.  You have difficulty speaking.  You develop  severe headaches.  Your nausea or vomiting continues or gets worse.  You develop visual changes.  Your family or friends notice any behavioral changes.  Your condition gets worse.  You have a fever.  You develop a stiff neck or sensitivity to light. MAKE SURE YOU:   Understand these instructions.  Will watch your condition.  Will get help right away if you are not doing well or get worse. Document Released: 04/20/2006 Document Revised: 10/05/2011 Document Reviewed: 04/02/2011 ExitCare Patient Information 2015 [employer]. This information is not intended to replace advice given to you by your health care provider. Make sure you discuss any questions you have with your health care provider.    

## 2014-11-27 NOTE — Telephone Encounter (Signed)
FYI.[name].[name].Kim a Child psychotherapistsocial worker with [facility] called to advise that patient is being seen in ED today for BP and diabetic issues. She states that pt is having problems obtaining her janumet and levemir due to insurance coverage. She advised that she has explored several options including [facility] list and patient assistance programs with no avail. I advised that we are aware and have had various conversation regarding samples, pt asst etc.. I advised also that pt has not been seen in the office since 09/2013 which makes it hard to management when pt is noncompliant.

## 2014-11-27 NOTE — ED Provider Notes (Signed)
CSN: [id number]     Arrival date & time 11/27/14  0932 History   First MD Initiated Contact with Patient 11/27/14 [phone]     Chief Complaint  Patient presents with  . Dizziness      HPI  Patient states for about a week to set intermittent episodes where she feels dizziness. Describes a sensation. She cannot lie flat without symptoms although it is somewhat better to lay down. History of diabetes and hypertension. Has been off her medications for a few months  No chest pain or shortness of breath. Has not checked her blood sugar in several months. States that she previously had "mini stroke" in 6 she had similar symptoms and was seen at an urgent care and a CAT scan suggested a previous stroke per her report.  She denies difficulty with use of extremities. Does have difficulty with ambulation due to the dizziness.  Past Medical History  Diagnosis Date  . Allergic rhinitis   . Microcytic anemia   . Asthma   . Diabetes mellitus type II   . GERD (gastroesophageal reflux disease)   . Hypertension   . Osteoarthritis   . Headache(784.0)     when my bp is up  . CKD (chronic kidney disease), stage II   . Atypical chest pain     a. 03/2014: normal nuclear stress test.  . Hypertensive heart disease   . Chronic diastolic CHF (congestive heart failure)     a. Dx 03/2014 with echo - moderate LVH, EF 55-60%, grade 1 diastolic dysfunction, mildly dilated LA.   Past Surgical History  Procedure Laterality Date  . Tubal ligation    . Shoulder arthroscopy Bilateral    Family History  Problem Relation Age of Onset  . Diabetes Other   . Hypertension Other   . Stroke Other   . Cancer Other     colon; prostate  . Stroke Mother   . Cancer Father   . Hypertension Brother   . Kidney disease Neg Hx   . Alcohol abuse Neg Hx   . Asthma Neg Hx   . COPD Neg Hx   . Depression Neg Hx   . Drug abuse Neg Hx   . Early death Neg Hx   . Hearing loss Neg Hx   . Heart disease Neg Hx   . Hyperlipidemia  Neg Hx    History  Substance Use Topics  . Smoking status: Former Smoker    Quit date: 08/29/2010  . Smokeless tobacco: Never Used  . Alcohol Use: No     Comment: quit 4 years   OB History    No data available     Review of Systems  Constitutional: Negative for fever, chills, diaphoresis, appetite change and fatigue.  HENT: Negative for mouth sores, sore throat and trouble swallowing.   Eyes: Negative for visual disturbance.  Respiratory: Negative for cough, chest tightness, shortness of breath and wheezing.   Cardiovascular: Negative for chest pain.  Gastrointestinal: Positive for nausea. Negative for vomiting, abdominal pain, diarrhea and abdominal distention.  Endocrine: Negative for polydipsia, polyphagia and polyuria.  Genitourinary: Negative for dysuria, frequency and hematuria.  Musculoskeletal: Positive for neck pain. Negative for gait problem.  Skin: Negative for color change, pallor and rash.  Neurological: Positive for dizziness and light-headedness. Negative for syncope and headaches.  Hematological: Does not bruise/bleed easily.  Psychiatric/Behavioral: Negative for behavioral problems and confusion.   complains of intermittent right-sided headache, and right posterior neck pain.    Allergies  Latex and Lisinopril  Home Medications   Prior to Admission medications   Medication Sig Start Date End Date Taking? Authorizing Provider  acetaminophen (TYLENOL) 500 MG tablet Take 1,000 mg by mouth every 6 (six) hours as needed for mild pain, moderate pain or headache.   Yes Historical Provider, MD  aspirin EC 81 MG EC tablet Take 1 tablet (81 mg total) by mouth daily. 04/01/14  Yes Dayna N Dunn, PA-C  fluconazole (DIFLUCAN) 150 MG tablet Take 150 mg by mouth once. With 3 refills   Yes Historical Provider, MD  Multiple Vitamins-Minerals (MULTIVITAMIN WITH MINERALS) tablet Take 1 tablet by mouth daily.   Yes Historical Provider, MD  amLODipine (NORVASC) 5 MG tablet Take 1  tablet (5 mg total) by mouth daily. 11/27/14   Rolland Porter, MD  carvedilol (COREG) 6.25 MG tablet Take 1 tablet (6.25 mg total) by mouth 2 (two) times daily with a meal. 11/27/14   Rolland Porter, MD  insulin detemir (LEVEMIR) 100 UNIT/ML injection Inject 0.5 mLs (50 Units total) into the skin at bedtime. 11/27/14   Rolland Porter, MD  meclizine (ANTIVERT) 25 MG tablet Take 1 tablet (25 mg total) by mouth 4 (four) times daily. 11/27/14   Rolland Porter, MD  sitaGLIPtin-metformin (JANUMET) 50-1000 MG per tablet Take 1 tablet by mouth 2 (two) times daily with a meal. 11/27/14   Rolland Porter, MD  triamterene-hydrochlorothiazide (MAXZIDE-25) 37.5-25 MG per tablet Take 1 tablet by mouth daily. 11/27/14   Rolland Porter, MD   BP 178/85 mmHg  Pulse 70  Temp(Src) 97.7 F (36.5 C) (Oral)  Resp 18  SpO2 100%  LMP 02/23/2012 Physical Exam  Constitutional: She is oriented to person, place, and time. She appears well-developed and well-nourished. No distress.  HENT:  Head: Normocephalic.    Eyes: Conjunctivae are normal. Pupils are equal, round, and reactive to light. No scleral icterus.  Neck: Normal range of motion. Neck supple. No thyromegaly present.  Cardiovascular: Normal rate and regular rhythm.  Exam reveals no gallop and no friction rub.   No murmur heard. Pulmonary/Chest: Effort normal and breath sounds normal. No respiratory distress. She has no wheezes. She has no rales.  Abdominal: Soft. Bowel sounds are normal. She exhibits no distension. There is no tenderness. There is no rebound.  Musculoskeletal: Normal range of motion.  Neurological: She is alert and oriented to person, place, and time.  Lateral collateral eye movement does not produce supple nystagmus, but does reproduce her symptoms. No additional cranial nerve deficits.  Skin: Skin is warm and dry. No rash noted.  Psychiatric: She has a normal mood and affect. Her behavior is normal.    ED Course  Procedures (including critical care time) Labs  Review Labs Reviewed  CBC WITH DIFFERENTIAL/PLATELET - Abnormal; Notable for the following:    RBC 5.44 (*)    MCV 72.8 (*)    MCH 23.5 (*)    RDW 15.6 (*)    All other components within normal limits  COMPREHENSIVE METABOLIC PANEL - Abnormal; Notable for the following:    Sodium 133 (*)    Glucose, Bld 425 (*)    Creatinine, Ser 1.02 (*)    All other components within normal limits  CBG MONITORING, ED - Abnormal; Notable for the following:    Glucose-Capillary 388 (*)    All other components within normal limits  CBG MONITORING, ED - Abnormal; Notable for the following:    Glucose-Capillary 320 (*)    All other components within normal  limits  TROPONIN I    Imaging Review Mr Shirlee Latch Wo Contrast  11/27/2014   CLINICAL DATA:  CVA.  Dizziness.  EXAM: MR HEAD WITHOUT CONTRAST  MR CIRCLE OF WILLIS WITHOUT CONTRAST  MRA OF THE NECK WITHOUT AND WITH CONTRAST  TECHNIQUE: Multiplanar, multiecho pulse sequences of the brain and surrounding structures were obtained according to standard protocol without intravenous contrast.; Angiographic images of the Circle of Willis were obtained using MRA technique without intravenous contrast.; Multiplanar and multiecho pulse sequences of the neck were obtained without and with intravenous contrast. Angiographic images of the neck were obtained using MRA technique without and with intravenous contast.  CONTRAST:  20mL MULTIHANCE GADOBENATE DIMEGLUMINE 529 MG/ML IV SOLN  COMPARISON:  None  FINDINGS: MR HEAD FINDINGS  Calvarium and upper cervical spine: No marrow signal abnormality.  Orbits: No significant findings.  Sinuses: Probable mucous retention cysts in the bilateral maxillary antra, nonobstructive.  Brain: No acute abnormality such as acute infarct, hemorrhage, hydrocephalus, or mass lesion. No evidence of large vessel occlusion. Mild patchy bilateral cerebral white matter T2 and FLAIR hyperintensity, nonspecific but usually from chronic vessel disease. The  largest low-density in the left frontal deep white matter is stable since at least 2010 head CT.  MR CIRCLE OF WILLIS FINDINGS  Mild dominance of the left vertebral artery. The PICAS are widely patent. AICAsis not visible. Symmetric and normal appearing superior cerebellar and PCAs.  Balanced anterior circulation with anterior communicating artery present. There is trifurcation of the A2 segments. Bilateral posterior communicating arteries with a 1 mm outpouching on the left likely infundibulum.  There are tandem moderate narrowings of the right M1 segment. No surrounding subarachnoid hemorrhage on FLAIR imaging or evidence of intramural hematoma and T1 weighted imaging. This is presumably atherosclerosis given the patient's clinical risk factors. No other notable stenoses. No major vessel occlusion.  MRA NECK FINDINGS  Normal aortic branching.  On time-of-flight imaging there is antegrade flow in the bilateral vertebral and carotid systems.  No evidence of stenosis or dissection involving the bilateral carotid or vertebral systems.  IMPRESSION: 1. No acute intracranial findings, including infarct. 2. Chronic mild white matter disease, usually from small vessel ischemia given history of hypertension and diabetes. 3. Tandem moderate right M1 stenosis. 4. 1 mm outpouching from the left supraclinoid carotid, favor infundibulum over aneurysm. 5. No arterial stenosis in the neck.   Electronically Signed   By: Marnee Spring M.D.   On: 11/27/2014 12:30   Mr Angiogram Neck W Wo Contrast  11/27/2014   CLINICAL DATA:  CVA.  Dizziness.  EXAM: MR HEAD WITHOUT CONTRAST  MR CIRCLE OF WILLIS WITHOUT CONTRAST  MRA OF THE NECK WITHOUT AND WITH CONTRAST  TECHNIQUE: Multiplanar, multiecho pulse sequences of the brain and surrounding structures were obtained according to standard protocol without intravenous contrast.; Angiographic images of the Circle of Willis were obtained using MRA technique without intravenous contrast.;  Multiplanar and multiecho pulse sequences of the neck were obtained without and with intravenous contrast. Angiographic images of the neck were obtained using MRA technique without and with intravenous contast.  CONTRAST:  20mL MULTIHANCE GADOBENATE DIMEGLUMINE 529 MG/ML IV SOLN  COMPARISON:  None  FINDINGS: MR HEAD FINDINGS  Calvarium and upper cervical spine: No marrow signal abnormality.  Orbits: No significant findings.  Sinuses: Probable mucous retention cysts in the bilateral maxillary antra, nonobstructive.  Brain: No acute abnormality such as acute infarct, hemorrhage, hydrocephalus, or mass lesion. No evidence of large vessel occlusion. Mild  patchy bilateral cerebral white matter T2 and FLAIR hyperintensity, nonspecific but usually from chronic vessel disease. The largest low-density in the left frontal deep white matter is stable since at least 2010 head CT.  MR CIRCLE OF WILLIS FINDINGS  Mild dominance of the left vertebral artery. The PICAS are widely patent. AICAsis not visible. Symmetric and normal appearing superior cerebellar and PCAs.  Balanced anterior circulation with anterior communicating artery present. There is trifurcation of the A2 segments. Bilateral posterior communicating arteries with a 1 mm outpouching on the left likely infundibulum.  There are tandem moderate narrowings of the right M1 segment. No surrounding subarachnoid hemorrhage on FLAIR imaging or evidence of intramural hematoma and T1 weighted imaging. This is presumably atherosclerosis given the patient's clinical risk factors. No other notable stenoses. No major vessel occlusion.  MRA NECK FINDINGS  Normal aortic branching.  On time-of-flight imaging there is antegrade flow in the bilateral vertebral and carotid systems.  No evidence of stenosis or dissection involving the bilateral carotid or vertebral systems.  IMPRESSION: 1. No acute intracranial findings, including infarct. 2. Chronic mild white matter disease, usually  from small vessel ischemia given history of hypertension and diabetes. 3. Tandem moderate right M1 stenosis. 4. 1 mm outpouching from the left supraclinoid carotid, favor infundibulum over aneurysm. 5. No arterial stenosis in the neck.   Electronically Signed   By: Marnee Spring M.D.   On: 11/27/2014 12:30   Mr Brain Wo Contrast  11/27/2014   CLINICAL DATA:  CVA.  Dizziness.  EXAM: MR HEAD WITHOUT CONTRAST  MR CIRCLE OF WILLIS WITHOUT CONTRAST  MRA OF THE NECK WITHOUT AND WITH CONTRAST  TECHNIQUE: Multiplanar, multiecho pulse sequences of the brain and surrounding structures were obtained according to standard protocol without intravenous contrast.; Angiographic images of the Circle of Willis were obtained using MRA technique without intravenous contrast.; Multiplanar and multiecho pulse sequences of the neck were obtained without and with intravenous contrast. Angiographic images of the neck were obtained using MRA technique without and with intravenous contast.  CONTRAST:  20mL MULTIHANCE GADOBENATE DIMEGLUMINE 529 MG/ML IV SOLN  COMPARISON:  None  FINDINGS: MR HEAD FINDINGS  Calvarium and upper cervical spine: No marrow signal abnormality.  Orbits: No significant findings.  Sinuses: Probable mucous retention cysts in the bilateral maxillary antra, nonobstructive.  Brain: No acute abnormality such as acute infarct, hemorrhage, hydrocephalus, or mass lesion. No evidence of large vessel occlusion. Mild patchy bilateral cerebral white matter T2 and FLAIR hyperintensity, nonspecific but usually from chronic vessel disease. The largest low-density in the left frontal deep white matter is stable since at least 2010 head CT.  MR CIRCLE OF WILLIS FINDINGS  Mild dominance of the left vertebral artery. The PICAS are widely patent. AICAsis not visible. Symmetric and normal appearing superior cerebellar and PCAs.  Balanced anterior circulation with anterior communicating artery present. There is trifurcation of the A2  segments. Bilateral posterior communicating arteries with a 1 mm outpouching on the left likely infundibulum.  There are tandem moderate narrowings of the right M1 segment. No surrounding subarachnoid hemorrhage on FLAIR imaging or evidence of intramural hematoma and T1 weighted imaging. This is presumably atherosclerosis given the patient's clinical risk factors. No other notable stenoses. No major vessel occlusion.  MRA NECK FINDINGS  Normal aortic branching.  On time-of-flight imaging there is antegrade flow in the bilateral vertebral and carotid systems.  No evidence of stenosis or dissection involving the bilateral carotid or vertebral systems.  IMPRESSION: 1. No acute intracranial  findings, including infarct. 2. Chronic mild white matter disease, usually from small vessel ischemia given history of hypertension and diabetes. 3. Tandem moderate right M1 stenosis. 4. 1 mm outpouching from the left supraclinoid carotid, favor infundibulum over aneurysm. 5. No arterial stenosis in the neck.   Electronically Signed   By: Marnee SpringJonathon  Watts M.D.   On: 11/27/2014 12:30     EKG Interpretation None      MDM   Final diagnoses:  Neck pain  Vertigo  Essential hypertension  Type 2 diabetes mellitus without complication    MRI is negative. Symptoms are much improved. Blood sugar improves with insulin and fluids. Blood pressure improves with medications both IV, and by mouth  Heart case manager has spent a great of time with her. Discussed medications, consulting with her primary care physician and myself. Try to determine the least expensive means possible for obtaining her medications. She's been given multiple pharmacies, credits, coupons, and follow-up possibilities for obtaining and using her medications.      Rolland PorterMark Jeweldean Drohan, MD 11/27/14 [phone]

## 2014-11-27 NOTE — ED Notes (Signed)
Pt transported to MR 

## 2014-11-30 ENCOUNTER — Other Ambulatory Visit: Payer: Self-pay

## 2014-11-30 MED ORDER — TRIAMTERENE-HCTZ 37.5-25 MG PO TABS: 1.0000 | ORAL_TABLET | Freq: Every day | ORAL | Status: DC

## 2014-12-05 LAB — HM DIABETES EYE EXAM

## 2015-01-10 ENCOUNTER — Telehealth: Payer: Self-pay | Admitting: Internal Medicine

## 2015-01-10 NOTE — Telephone Encounter (Signed)
Patient is requesting refill for insulin detemir (LEVEMIR) 100 UNIT/ML injection [[id number]] . She just needs the pen. She is also needing a prescription for diflucan. She does have an appointment with you on 6/21

## 2015-01-10 NOTE — Telephone Encounter (Signed)
Patient last seen 10/09/13 and was advised to follow up 10/2013.

## 2015-01-15 ENCOUNTER — Encounter: Payer: Self-pay | Admitting: Internal Medicine

## 2015-01-15 ENCOUNTER — Other Ambulatory Visit (INDEPENDENT_AMBULATORY_CARE_PROVIDER_SITE_OTHER): Payer: PRIVATE HEALTH INSURANCE

## 2015-01-15 ENCOUNTER — Ambulatory Visit (INDEPENDENT_AMBULATORY_CARE_PROVIDER_SITE_OTHER): Payer: PRIVATE HEALTH INSURANCE | Admitting: Internal Medicine

## 2015-01-15 VITALS — HR 72 | Temp 98.5°F | Ht 61.0 in | Wt 271.0 lb

## 2015-01-15 DIAGNOSIS — E1129 Type 2 diabetes mellitus with other diabetic kidney complication: Secondary | ICD-10-CM | POA: Diagnosis not present

## 2015-01-15 DIAGNOSIS — E1165 Type 2 diabetes mellitus with hyperglycemia: Secondary | ICD-10-CM

## 2015-01-15 DIAGNOSIS — G4730 Sleep apnea, unspecified: Secondary | ICD-10-CM | POA: Diagnosis not present

## 2015-01-15 DIAGNOSIS — M545 Low back pain, unspecified: Secondary | ICD-10-CM

## 2015-01-15 DIAGNOSIS — I10 Essential (primary) hypertension: Secondary | ICD-10-CM

## 2015-01-15 DIAGNOSIS — IMO0002 Reserved for concepts with insufficient information to code with codable children: Secondary | ICD-10-CM

## 2015-01-15 DIAGNOSIS — M79605 Pain in left leg: Secondary | ICD-10-CM

## 2015-01-15 LAB — URINALYSIS, ROUTINE W REFLEX MICROSCOPIC
BILIRUBIN URINE: NEGATIVE
Hgb urine dipstick: NEGATIVE
KETONES UR: NEGATIVE
LEUKOCYTES UA: NEGATIVE
NITRITE: NEGATIVE
PH: 6.5 (ref 5.0–8.0)
RBC / HPF: NONE SEEN (ref 0–?)
Specific Gravity, Urine: 1.015 (ref 1.000–1.030)
Total Protein, Urine: 30 — AB
Urine Glucose: >=1000 — AB
Urobilinogen, UA: 0.2 (ref 0.0–1.0)

## 2015-01-15 LAB — HEMOGLOBIN A1C: Hgb A1c MFr Bld: 13.8 % — ABNORMAL HIGH (ref 4.6–6.5)

## 2015-01-15 LAB — BASIC METABOLIC PANEL
BUN: 25 mg/dL — ABNORMAL HIGH (ref 6–23)
CALCIUM: 9.3 mg/dL (ref 8.4–10.5)
CO2: 28 mEq/L (ref 19–32)
Chloride: 99 mEq/L (ref 96–112)
Creatinine, Ser: 1.26 mg/dL — ABNORMAL HIGH (ref 0.40–1.20)
GFR: 46.73 mL/min — AB (ref >60.00)
Glucose, Bld: 401 mg/dL — ABNORMAL HIGH (ref 70–99)
Potassium: 4.4 mEq/L (ref 3.5–5.1)
SODIUM: 134 meq/L — AB (ref 135–145)

## 2015-01-15 LAB — MICROALBUMIN / CREATININE URINE RATIO
Creatinine,U: 69.3 mg/dL
MICROALB UR: 18.7 mg/dL — AB (ref 0.0–1.9)
Microalb Creat Ratio: 27 mg/g (ref 0.0–30.0)

## 2015-01-15 MED ORDER — SITAGLIPTIN PHOS-METFORMIN HCL 50-1000 MG PO TABS: 1.0000 | ORAL_TABLET | Freq: Two times a day (BID) | ORAL | Status: DC

## 2015-01-15 MED ORDER — FLUCONAZOLE 150 MG PO TABS: 150.0000 mg | ORAL_TABLET | Freq: Once | ORAL | Status: DC

## 2015-01-15 MED ORDER — CLONIDINE HCL 0.2 MG/24HR TD PTWK: 0.2000 mg | MEDICATED_PATCH | TRANSDERMAL | Status: DC

## 2015-01-15 MED ORDER — CARVEDILOL 12.5 MG PO TABS: 12.5000 mg | ORAL_TABLET | Freq: Two times a day (BID) | ORAL | Status: DC

## 2015-01-15 MED ORDER — INSULIN DETEMIR 100 UNIT/ML FLEXPEN: 75.0000 [IU] | PEN_INJECTOR | Freq: Every day | SUBCUTANEOUS | Status: DC

## 2015-01-15 MED ORDER — AMLODIPINE BESYLATE 10 MG PO TABS: 10.0000 mg | ORAL_TABLET | Freq: Every day | ORAL | Status: DC

## 2015-01-15 NOTE — Patient Instructions (Signed)

## 2015-01-15 NOTE — Progress Notes (Signed)
Pre visit review using our clinic review tool, if applicable. No additional management support is needed unless otherwise documented below in the visit note. 

## 2015-01-15 NOTE — Progress Notes (Signed)
Subjective:  Patient ID: Lori Mclaughlin, female    DOB: 10-13-1960  Age: 54 y.o. MRN: [id number]  CC: Hypertension and Diabetes   HPI Lori Mclaughlin presents for HTN and DM2. She tells me that she suffers from chronic intermittent episodes of low back pain. She has a hx of heavy snoring and possible OSA but she did not have a sleep study done.   Outpatient Prescriptions Prior to Visit  Medication Sig Dispense Refill  . acetaminophen (TYLENOL) 500 MG tablet Take 1,000 mg by mouth every 6 (six) hours as needed for mild pain, moderate pain or headache.    [name] aspirin EC 81 MG EC tablet Take 1 tablet (81 mg total) by mouth daily.    . Multiple Vitamins-Minerals (MULTIVITAMIN WITH MINERALS) tablet Take 1 tablet by mouth daily.    [name] triamterene-hydrochlorothiazide (MAXZIDE-25) 37.5-25 MG per tablet Take 1 tablet by mouth daily. 90 tablet 0  . amLODipine (NORVASC) 5 MG tablet Take 1 tablet (5 mg total) by mouth daily. 30 tablet 0  . carvedilol (COREG) 6.25 MG tablet Take 1 tablet (6.25 mg total) by mouth 2 (two) times daily with a meal. 30 tablet 0  . fluconazole (DIFLUCAN) 150 MG tablet Take 150 mg by mouth once. With 3 refills    . insulin detemir (LEVEMIR) 100 UNIT/ML injection Inject 0.5 mLs (50 Units total) into the skin at bedtime. 10 mL 0  . meclizine (ANTIVERT) 25 MG tablet Take 1 tablet (25 mg total) by mouth 4 (four) times daily. 28 tablet 0  . sitaGLIPtin-metformin (JANUMET) 50-1000 MG per tablet Take 1 tablet by mouth 2 (two) times daily with a meal. 30 tablet 0   No facility-administered medications prior to visit.    ROS Review of Systems  Constitutional: Negative.  Negative for fever, chills, diaphoresis, appetite change, fatigue and unexpected weight change.  HENT: Negative.  Negative for trouble swallowing and voice change.   Eyes: Negative.   Respiratory: Positive for apnea. Negative for cough, choking, chest tightness, shortness of breath, wheezing and  stridor.   Cardiovascular: Negative.  Negative for chest pain, palpitations and leg swelling.  Gastrointestinal: Negative.  Negative for nausea, abdominal pain, diarrhea, constipation and blood in stool.  Endocrine: Negative.  Negative for polydipsia, polyphagia and polyuria.  Genitourinary: Negative.  Negative for dysuria, urgency, frequency, flank pain and difficulty urinating.  Musculoskeletal: Positive for back pain. Negative for myalgias, joint swelling, arthralgias, gait problem, neck pain and neck stiffness.  Skin: Negative.   Allergic/Immunologic: Negative.   Neurological: Negative.  Negative for dizziness, tremors, weakness and light-headedness.  Hematological: Negative.  Negative for adenopathy. Does not bruise/bleed easily.  Psychiatric/Behavioral: Negative.     Objective:  Pulse 72  Temp(Src) 98.5 F (36.9 C) (Oral)  Ht  (1.549 m)  Wt 271 lb (122.925 kg)  BMI 51.23 kg/m2  SpO2 95%  LMP 02/23/2012  BP Readings from Last 3 Encounters:  11/27/14 186/89  04/11/14 146/98  04/01/14 123/52    Wt Readings from Last 3 Encounters:  01/15/15 271 lb (122.925 kg)  04/11/14 277 lb 9.6 oz (125.919 kg)  04/01/14 273 lb 3.2 oz (123.923 kg)    Physical Exam  Constitutional: She is oriented to person, place, and time. She appears well-developed and well-nourished. No distress.  HENT:  Head: Normocephalic and atraumatic.  Mouth/Throat: Oropharynx is clear and moist. No oropharyngeal exudate.  Eyes: Conjunctivae are normal. Right eye exhibits no discharge. Left eye exhibits no discharge. No scleral icterus.  Neck: Normal range of motion. Neck supple. No JVD present. No tracheal deviation present. No thyromegaly present.  Cardiovascular: Normal rate, regular rhythm, normal heart sounds and intact distal pulses.  Exam reveals no gallop and no friction rub.   No murmur heard. Pulmonary/Chest: Effort normal and breath sounds normal. No stridor. No respiratory distress. She has no  wheezes. She has no rales. She exhibits no tenderness.  Abdominal: Soft. Bowel sounds are normal. She exhibits no distension and no mass. There is no tenderness. There is no rebound and no guarding.  Musculoskeletal: Normal range of motion. She exhibits no edema or tenderness.  Lymphadenopathy:    She has no cervical adenopathy.  Neurological: She is oriented to person, place, and time.  Skin: Skin is warm and dry. No rash noted. She is not diaphoretic. No erythema. No pallor.  Psychiatric: She has a normal mood and affect. Her behavior is normal. Judgment and thought content normal.  Vitals reviewed.   Lab Results  Component Value Date   WBC 6.1 11/27/2014   HGB 12.8 11/27/2014   HCT 39.6 11/27/2014   PLT 183 11/27/2014   GLUCOSE 401* 01/15/2015   CHOL 162 01/26/2012   TRIG 73.0 01/26/2012   HDL 54.20 01/26/2012   LDLCALC 93 01/26/2012   ALT 15 11/27/2014   AST 16 11/27/2014   NA 134* 01/15/2015   K 4.4 01/15/2015   CL 99 01/15/2015   CREATININE 1.26* 01/15/2015   BUN 25* 01/15/2015   CO2 28 01/15/2015   TSH 2.050 03/30/2014   INR 0.96 03/30/2014   HGBA1C 13.8* 01/15/2015   MICROALBUR 18.7* 01/15/2015    Mr Mra Head Wo Contrast  11/27/2014   CLINICAL DATA:  CVA.  Dizziness.  EXAM: MR HEAD WITHOUT CONTRAST  MR CIRCLE OF WILLIS WITHOUT CONTRAST  MRA OF THE NECK WITHOUT AND WITH CONTRAST  TECHNIQUE: Multiplanar, multiecho pulse sequences of the brain and surrounding structures were obtained according to standard protocol without intravenous contrast.; Angiographic images of the Circle of Willis were obtained using MRA technique without intravenous contrast.; Multiplanar and multiecho pulse sequences of the neck were obtained without and with intravenous contrast. Angiographic images of the neck were obtained using MRA technique without and with intravenous contast.  CONTRAST:  20mL MULTIHANCE GADOBENATE DIMEGLUMINE 529 MG/ML IV SOLN  COMPARISON:  None  FINDINGS: MR HEAD FINDINGS   Calvarium and upper cervical spine: No marrow signal abnormality.  Orbits: No significant findings.  Sinuses: Probable mucous retention cysts in the bilateral maxillary antra, nonobstructive.  Brain: No acute abnormality such as acute infarct, hemorrhage, hydrocephalus, or mass lesion. No evidence of large vessel occlusion. Mild patchy bilateral cerebral white matter T2 and FLAIR hyperintensity, nonspecific but usually from chronic vessel disease. The largest low-density in the left frontal deep white matter is stable since at least 2010 head CT.  MR CIRCLE OF WILLIS FINDINGS  Mild dominance of the left vertebral artery. The PICAS are widely patent. AICAsis not visible. Symmetric and normal appearing superior cerebellar and PCAs.  Balanced anterior circulation with anterior communicating artery present. There is trifurcation of the A2 segments. Bilateral posterior communicating arteries with a 1 mm outpouching on the left likely infundibulum.  There are tandem moderate narrowings of the right M1 segment. No surrounding subarachnoid hemorrhage on FLAIR imaging or evidence of intramural hematoma and T1 weighted imaging. This is presumably atherosclerosis given the patient's clinical risk factors. No other notable stenoses. No major vessel occlusion.  MRA NECK FINDINGS  Normal aortic branching.  On time-of-flight imaging there is antegrade flow in the bilateral vertebral and carotid systems.  No evidence of stenosis or dissection involving the bilateral carotid or vertebral systems.  IMPRESSION: 1. No acute intracranial findings, including infarct. 2. Chronic mild white matter disease, usually from small vessel ischemia given history of hypertension and diabetes. 3. Tandem moderate right M1 stenosis. 4. 1 mm outpouching from the left supraclinoid carotid, favor infundibulum over aneurysm. 5. No arterial stenosis in the neck.   Electronically Signed   By: Marnee Spring M.D.   On: 11/27/2014 12:30   Mr Angiogram Neck  W Wo Contrast  11/27/2014   CLINICAL DATA:  CVA.  Dizziness.  EXAM: MR HEAD WITHOUT CONTRAST  MR CIRCLE OF WILLIS WITHOUT CONTRAST  MRA OF THE NECK WITHOUT AND WITH CONTRAST  TECHNIQUE: Multiplanar, multiecho pulse sequences of the brain and surrounding structures were obtained according to standard protocol without intravenous contrast.; Angiographic images of the Circle of Willis were obtained using MRA technique without intravenous contrast.; Multiplanar and multiecho pulse sequences of the neck were obtained without and with intravenous contrast. Angiographic images of the neck were obtained using MRA technique without and with intravenous contast.  CONTRAST:  31mL MULTIHANCE GADOBENATE DIMEGLUMINE 529 MG/ML IV SOLN  COMPARISON:  None  FINDINGS: MR HEAD FINDINGS  Calvarium and upper cervical spine: No marrow signal abnormality.  Orbits: No significant findings.  Sinuses: Probable mucous retention cysts in the bilateral maxillary antra, nonobstructive.  Brain: No acute abnormality such as acute infarct, hemorrhage, hydrocephalus, or mass lesion. No evidence of large vessel occlusion. Mild patchy bilateral cerebral white matter T2 and FLAIR hyperintensity, nonspecific but usually from chronic vessel disease. The largest low-density in the left frontal deep white matter is stable since at least 2010 head CT.  MR CIRCLE OF WILLIS FINDINGS  Mild dominance of the left vertebral artery. The PICAS are widely patent. AICAsis not visible. Symmetric and normal appearing superior cerebellar and PCAs.  Balanced anterior circulation with anterior communicating artery present. There is trifurcation of the A2 segments. Bilateral posterior communicating arteries with a 1 mm outpouching on the left likely infundibulum.  There are tandem moderate narrowings of the right M1 segment. No surrounding subarachnoid hemorrhage on FLAIR imaging or evidence of intramural hematoma and T1 weighted imaging. This is presumably atherosclerosis  given the patient's clinical risk factors. No other notable stenoses. No major vessel occlusion.  MRA NECK FINDINGS  Normal aortic branching.  On time-of-flight imaging there is antegrade flow in the bilateral vertebral and carotid systems.  No evidence of stenosis or dissection involving the bilateral carotid or vertebral systems.  IMPRESSION: 1. No acute intracranial findings, including infarct. 2. Chronic mild white matter disease, usually from small vessel ischemia given history of hypertension and diabetes. 3. Tandem moderate right M1 stenosis. 4. 1 mm outpouching from the left supraclinoid carotid, favor infundibulum over aneurysm. 5. No arterial stenosis in the neck.   Electronically Signed   By: Marnee Spring M.D.   On: 11/27/2014 12:30   Mr Brain Wo Contrast  11/27/2014   CLINICAL DATA:  CVA.  Dizziness.  EXAM: MR HEAD WITHOUT CONTRAST  MR CIRCLE OF WILLIS WITHOUT CONTRAST  MRA OF THE NECK WITHOUT AND WITH CONTRAST  TECHNIQUE: Multiplanar, multiecho pulse sequences of the brain and surrounding structures were obtained according to standard protocol without intravenous contrast.; Angiographic images of the Circle of Willis were obtained using MRA technique without intravenous contrast.; Multiplanar and multiecho pulse sequences of the neck were obtained  without and with intravenous contrast. Angiographic images of the neck were obtained using MRA technique without and with intravenous contast.  CONTRAST:  20mL MULTIHANCE GADOBENATE DIMEGLUMINE 529 MG/ML IV SOLN  COMPARISON:  None  FINDINGS: MR HEAD FINDINGS  Calvarium and upper cervical spine: No marrow signal abnormality.  Orbits: No significant findings.  Sinuses: Probable mucous retention cysts in the bilateral maxillary antra, nonobstructive.  Brain: No acute abnormality such as acute infarct, hemorrhage, hydrocephalus, or mass lesion. No evidence of large vessel occlusion. Mild patchy bilateral cerebral white matter T2 and FLAIR hyperintensity,  nonspecific but usually from chronic vessel disease. The largest low-density in the left frontal deep white matter is stable since at least 2010 head CT.  MR CIRCLE OF WILLIS FINDINGS  Mild dominance of the left vertebral artery. The PICAS are widely patent. AICAsis not visible. Symmetric and normal appearing superior cerebellar and PCAs.  Balanced anterior circulation with anterior communicating artery present. There is trifurcation of the A2 segments. Bilateral posterior communicating arteries with a 1 mm outpouching on the left likely infundibulum.  There are tandem moderate narrowings of the right M1 segment. No surrounding subarachnoid hemorrhage on FLAIR imaging or evidence of intramural hematoma and T1 weighted imaging. This is presumably atherosclerosis given the patient's clinical risk factors. No other notable stenoses. No major vessel occlusion.  MRA NECK FINDINGS  Normal aortic branching.  On time-of-flight imaging there is antegrade flow in the bilateral vertebral and carotid systems.  No evidence of stenosis or dissection involving the bilateral carotid or vertebral systems.  IMPRESSION: 1. No acute intracranial findings, including infarct. 2. Chronic mild white matter disease, usually from small vessel ischemia given history of hypertension and diabetes. 3. Tandem moderate right M1 stenosis. 4. 1 mm outpouching from the left supraclinoid carotid, favor infundibulum over aneurysm. 5. No arterial stenosis in the neck.   Electronically Signed   By: Marnee Spring M.D.   On: 11/27/2014 12:30    Assessment & Plan:   Lori Mclaughlin was seen today for hypertension and diabetes.  Diagnoses and all orders for this visit:  Essential hypertension Orders: -     Basic metabolic panel; Future -     Urinalysis, Routine w reflex microscopic (not at [facility]); Future  Type II diabetes mellitus with renal manifestations, uncontrolled - her blood sugars are not well controlled, will increase the dose of levemir to 75  u per day, I have asked her to see ENDO as well Orders: -     Basic metabolic panel; Future -     Hemoglobin A1c; Future -     Microalbumin / creatinine urine ratio; Future -     Insulin Detemir (LEVEMIR FLEXTOUCH) 100 UNIT/ML Pen; Inject 75 Units into the skin daily at 10 pm. -     Discontinue: sitaGLIPtin-metformin (JANUMET) 50-1000 MG per tablet; Take 1 tablet by mouth 2 (two) times daily with a meal. -     sitaGLIPtin-metformin (JANUMET) 50-1000 MG per tablet; Take 1 tablet by mouth 2 (two) times daily with a meal.  Sleep apnea Orders: -     Ambulatory referral to Pulmonology  Essential hypertension, malignant - her BP is not well controlled, will add on clonidine, will increase the doses of amlodipine and coreg, I have asked her to have the OSA evaluation and treated, will get a screening test done for RAS   Orders: -     cloNIDine (CATAPRES-TTS-2) 0.2 mg/24hr patch; Place 1 patch (0.2 mg total) onto the skin once a week. -  Renal; Future -     amLODipine (NORVASC) 10 MG tablet; Take 1 tablet (10 mg total) by mouth daily.  Other orders -     carvedilol (COREG) 12.5 MG tablet; Take 1 tablet (12.5 mg total) by mouth 2 (two) times daily with a meal. -     fluconazole (DIFLUCAN) 150 MG tablet; Take 1 tablet (150 mg total) by mouth once. With 3 refills   I have discontinued Ms. Simeon Craft amLODipine, carvedilol, insulin detemir, and meclizine. I have also changed her fluconazole. Additionally, I am having her start on Insulin Detemir, cloNIDine, carvedilol, and amLODipine. Lastly, I am having her maintain her multivitamin with minerals, aspirin, acetaminophen, triamterene-hydrochlorothiazide, and sitaGLIPtin-metformin.  Meds ordered this encounter  Medications  . Insulin Detemir (LEVEMIR FLEXTOUCH) 100 UNIT/ML Pen    Sig: Inject 75 Units into the skin daily at 10 pm.    Dispense:  15 mL    Refill:  11  . cloNIDine (CATAPRES-TTS-2) 0.2 mg/24hr patch    Sig: Place 1 patch  (0.2 mg total) onto the skin once a week.    Dispense:  4 patch    Refill:  12  . carvedilol (COREG) 12.5 MG tablet    Sig: Take 1 tablet (12.5 mg total) by mouth 2 (two) times daily with a meal.    Dispense:  60 tablet    Refill:  11  . DISCONTD: sitaGLIPtin-metformin (JANUMET) 50-1000 MG per tablet    Sig: Take 1 tablet by mouth 2 (two) times daily with a meal.    Dispense:  60 tablet    Refill:  11  . amLODipine (NORVASC) 10 MG tablet    Sig: Take 1 tablet (10 mg total) by mouth daily.    Dispense:  90 tablet    Refill:  3  . sitaGLIPtin-metformin (JANUMET) 50-1000 MG per tablet    Sig: Take 1 tablet by mouth 2 (two) times daily with a meal.    Dispense:  60 tablet    Refill:  11  . fluconazole (DIFLUCAN) 150 MG tablet    Sig: Take 1 tablet (150 mg total) by mouth once. With 3 refills    Dispense:  1 tablet    Refill:  3     Follow-up: Return in about 6 weeks (around 02/26/2015).  Sanda Linger, MD

## 2015-01-23 ENCOUNTER — Encounter: Payer: Self-pay | Admitting: Internal Medicine

## 2015-01-30 ENCOUNTER — Ambulatory Visit (HOSPITAL_COMMUNITY): Payer: PRIVATE HEALTH INSURANCE | Attending: Cardiovascular Disease

## 2015-01-30 DIAGNOSIS — I10 Essential (primary) hypertension: Secondary | ICD-10-CM | POA: Insufficient documentation

## 2015-01-31 ENCOUNTER — Encounter: Payer: Self-pay | Admitting: Internal Medicine

## 2015-01-31 NOTE — Addendum Note (Signed)
Addended by: Etta GrandchildJONES, THOMAS L on: 01/31/2015 04:02 PM   Modules accepted: Kipp BroodSmartSet

## 2015-02-26 ENCOUNTER — Ambulatory Visit: Payer: PRIVATE HEALTH INSURANCE | Admitting: Family Medicine

## 2015-03-18 ENCOUNTER — Ambulatory Visit: Payer: PRIVATE HEALTH INSURANCE | Admitting: Internal Medicine

## 2015-03-27 ENCOUNTER — Encounter: Payer: PRIVATE HEALTH INSURANCE | Admitting: Pulmonary Disease

## 2015-04-09 ENCOUNTER — Telehealth: Payer: Self-pay | Admitting: *Deleted

## 2015-04-09 ENCOUNTER — Other Ambulatory Visit: Payer: Self-pay | Admitting: Internal Medicine

## 2015-04-09 DIAGNOSIS — E1165 Type 2 diabetes mellitus with hyperglycemia: Principal | ICD-10-CM

## 2015-04-09 DIAGNOSIS — E1129 Type 2 diabetes mellitus with other diabetic kidney complication: Secondary | ICD-10-CM

## 2015-04-09 DIAGNOSIS — IMO0002 Reserved for concepts with insufficient information to code with codable children: Secondary | ICD-10-CM

## 2015-04-09 NOTE — Telephone Encounter (Signed)
Left msg on triage stating md gave her some samples of the Invokana to try, because the janumet is too expensive for her. Pt is wanting to know can she continue taking will need some more samples or prescription...Lori Mclaughlin

## 2015-04-09 NOTE — Telephone Encounter (Signed)
Called pt no answer LMOM with md response.../lmb 

## 2015-04-09 NOTE — Telephone Encounter (Signed)
It doesn't look like she has enough renal function to benefit from [place]. It also doesn't look like she has been on Invokana according to her records. Please ask her to come in for recheck on the kidney function to see if she is a candidate for invokana therapy.

## 2015-05-09 ENCOUNTER — Other Ambulatory Visit: Payer: Self-pay | Admitting: Physician Assistant

## 2015-06-12 ENCOUNTER — Emergency Department (HOSPITAL_COMMUNITY): Payer: PRIVATE HEALTH INSURANCE

## 2015-06-12 ENCOUNTER — Ambulatory Visit (INDEPENDENT_AMBULATORY_CARE_PROVIDER_SITE_OTHER): Payer: PRIVATE HEALTH INSURANCE | Admitting: Internal Medicine

## 2015-06-12 ENCOUNTER — Other Ambulatory Visit (INDEPENDENT_AMBULATORY_CARE_PROVIDER_SITE_OTHER): Payer: PRIVATE HEALTH INSURANCE

## 2015-06-12 ENCOUNTER — Encounter: Payer: Self-pay | Admitting: Internal Medicine

## 2015-06-12 ENCOUNTER — Emergency Department (HOSPITAL_COMMUNITY)
Admission: EM | Admit: 2015-06-12 | Discharge: 2015-06-12 | Disposition: A | Discharge status: Home / Self Care | Payer: PRIVATE HEALTH INSURANCE | Attending: Emergency Medicine | Admitting: Emergency Medicine

## 2015-06-12 ENCOUNTER — Encounter (HOSPITAL_COMMUNITY): Payer: Self-pay

## 2015-06-12 ENCOUNTER — Telehealth: Payer: Self-pay

## 2015-06-12 VITALS — BP 148/88 | HR 77 | Temp 97.8°F | Resp 16 | Ht 61.0 in | Wt 258.0 lb

## 2015-06-12 DIAGNOSIS — I130 Hypertensive heart and chronic kidney disease with heart failure and stage 1 through stage 4 chronic kidney disease, or unspecified chronic kidney disease: Secondary | ICD-10-CM | POA: Diagnosis not present

## 2015-06-12 DIAGNOSIS — I10 Essential (primary) hypertension: Secondary | ICD-10-CM

## 2015-06-12 DIAGNOSIS — Z8719 Personal history of other diseases of the digestive system: Secondary | ICD-10-CM | POA: Insufficient documentation

## 2015-06-12 DIAGNOSIS — Z7984 Long term (current) use of oral hypoglycemic drugs: Secondary | ICD-10-CM | POA: Insufficient documentation

## 2015-06-12 DIAGNOSIS — Z862 Personal history of diseases of the blood and blood-forming organs and certain disorders involving the immune mechanism: Secondary | ICD-10-CM | POA: Diagnosis not present

## 2015-06-12 DIAGNOSIS — E1165 Type 2 diabetes mellitus with hyperglycemia: Secondary | ICD-10-CM

## 2015-06-12 DIAGNOSIS — R739 Hyperglycemia, unspecified: Secondary | ICD-10-CM

## 2015-06-12 DIAGNOSIS — Z7982 Long term (current) use of aspirin: Secondary | ICD-10-CM | POA: Diagnosis not present

## 2015-06-12 DIAGNOSIS — E785 Hyperlipidemia, unspecified: Secondary | ICD-10-CM

## 2015-06-12 DIAGNOSIS — Z91040 Latex allergy status: Secondary | ICD-10-CM | POA: Diagnosis not present

## 2015-06-12 DIAGNOSIS — I5032 Chronic diastolic (congestive) heart failure: Secondary | ICD-10-CM | POA: Insufficient documentation

## 2015-06-12 DIAGNOSIS — J45909 Unspecified asthma, uncomplicated: Secondary | ICD-10-CM | POA: Insufficient documentation

## 2015-06-12 DIAGNOSIS — Z23 Encounter for immunization: Secondary | ICD-10-CM | POA: Diagnosis not present

## 2015-06-12 DIAGNOSIS — E1121 Type 2 diabetes mellitus with diabetic nephropathy: Secondary | ICD-10-CM

## 2015-06-12 DIAGNOSIS — Z794 Long term (current) use of insulin: Secondary | ICD-10-CM | POA: Insufficient documentation

## 2015-06-12 DIAGNOSIS — R000 Tachycardia, unspecified: Secondary | ICD-10-CM | POA: Insufficient documentation

## 2015-06-12 DIAGNOSIS — Z87891 Personal history of nicotine dependence: Secondary | ICD-10-CM | POA: Insufficient documentation

## 2015-06-12 DIAGNOSIS — N182 Chronic kidney disease, stage 2 (mild): Secondary | ICD-10-CM | POA: Diagnosis not present

## 2015-06-12 DIAGNOSIS — M1990 Unspecified osteoarthritis, unspecified site: Secondary | ICD-10-CM | POA: Insufficient documentation

## 2015-06-12 DIAGNOSIS — IMO0002 Reserved for concepts with insufficient information to code with codable children: Secondary | ICD-10-CM

## 2015-06-12 DIAGNOSIS — Z79899 Other long term (current) drug therapy: Secondary | ICD-10-CM | POA: Diagnosis not present

## 2015-06-12 DIAGNOSIS — Z1211 Encounter for screening for malignant neoplasm of colon: Secondary | ICD-10-CM | POA: Insufficient documentation

## 2015-06-12 DIAGNOSIS — Z1231 Encounter for screening mammogram for malignant neoplasm of breast: Secondary | ICD-10-CM | POA: Insufficient documentation

## 2015-06-12 LAB — I-STAT TROPONIN, ED
Troponin i, poc: 0 ng/mL (ref 0.00–0.08)
Troponin i, poc: 0.04 ng/mL (ref 0.00–0.08)

## 2015-06-12 LAB — URINALYSIS, ROUTINE W REFLEX MICROSCOPIC
Bilirubin Urine: NEGATIVE
HGB URINE DIPSTICK: NEGATIVE
KETONES UR: NEGATIVE mg/dL
Leukocytes, UA: NEGATIVE
Nitrite: NEGATIVE
PROTEIN: NEGATIVE mg/dL
Specific Gravity, Urine: 1.031 — ABNORMAL HIGH (ref 1.005–1.030)
pH: 6 (ref 5.0–8.0)

## 2015-06-12 LAB — CBC
HCT: 40.4 % (ref 36.0–46.0)
Hemoglobin: 13.3 g/dL (ref 12.0–15.0)
MCH: 24.7 pg — AB (ref 26.0–34.0)
MCHC: 32.9 g/dL (ref 30.0–36.0)
MCV: 75 fL — AB (ref 78.0–100.0)
PLATELETS: 200 10*3/uL (ref 150–400)
RBC: 5.39 MIL/uL — ABNORMAL HIGH (ref 3.87–5.11)
RDW: 14.6 % (ref 11.5–15.5)
WBC: 9.2 10*3/uL (ref 4.0–10.5)

## 2015-06-12 LAB — BASIC METABOLIC PANEL
Anion gap: 12 (ref 5–15)
BUN: 20 mg/dL (ref 6–23)
BUN: 21 mg/dL — AB (ref 6–20)
CALCIUM: 9.9 mg/dL (ref 8.4–10.5)
CHLORIDE: 92 mmol/L — AB (ref 101–111)
CO2: 28 mEq/L (ref 19–32)
CO2: 26 mmol/L (ref 22–32)
CREATININE: 1.34 mg/dL — AB (ref 0.40–1.20)
CREATININE: 1.41 mg/dL — AB (ref 0.44–1.00)
Calcium: 9.6 mg/dL (ref 8.9–10.3)
Chloride: 95 mEq/L — ABNORMAL LOW (ref 96–112)
GFR calc Af Amer: 47 mL/min — ABNORMAL LOW (ref >60)
GFR calc non Af Amer: 41 mL/min — ABNORMAL LOW (ref >60)
GFR: 43.46 mL/min — ABNORMAL LOW (ref >60.00)
Glucose, Bld: 604 mg/dL (ref 70–99)
Glucose, Bld: 648 mg/dL (ref 65–99)
Potassium: 4.5 mEq/L (ref 3.5–5.1)
Potassium: 4.1 mmol/L (ref 3.5–5.1)
Sodium: 133 mEq/L — ABNORMAL LOW (ref 135–145)
Sodium: 130 mmol/L — ABNORMAL LOW (ref 135–145)

## 2015-06-12 LAB — URINE MICROSCOPIC-ADD ON
Bacteria, UA: NONE SEEN
RBC / HPF: NONE SEEN RBC/hpf (ref 0–5)

## 2015-06-12 LAB — LIPID PANEL
CHOL/HDL RATIO: 3
Cholesterol: 158 mg/dL (ref 0–200)
HDL: 50 mg/dL (ref >39.00)
LDL Cholesterol: 90 mg/dL (ref 0–99)
NONHDL: 107.75
Triglycerides: 89 mg/dL (ref 0.0–149.0)
VLDL: 17.8 mg/dL (ref 0.0–40.0)

## 2015-06-12 LAB — HEMOGLOBIN A1C: HEMOGLOBIN A1C: 17.7 % — AB (ref 4.6–6.5)

## 2015-06-12 LAB — CBG MONITORING, ED
Glucose-Capillary: 456 mg/dL — ABNORMAL HIGH (ref 65–99)
Glucose-Capillary: 216 mg/dL — ABNORMAL HIGH (ref 65–99)

## 2015-06-12 LAB — TSH: TSH: 2.29 u[IU]/mL (ref 0.35–4.50)

## 2015-06-12 MED ORDER — SODIUM CHLORIDE 0.9 % IV BOLUS (SEPSIS)
1000.0000 mL | Freq: Once | INTRAVENOUS | Status: AC
  Administered 2015-06-12: 1000 mL via INTRAVENOUS

## 2015-06-12 MED ORDER — INSULIN ASPART 100 UNIT/ML ~~LOC~~ SOLN
6.0000 [IU] | Freq: Once | SUBCUTANEOUS | Status: AC
  Administered 2015-06-12: 6 [IU] via SUBCUTANEOUS
  Filled 2015-06-12: qty 1

## 2015-06-12 NOTE — Patient Instructions (Signed)

## 2015-06-12 NOTE — Progress Notes (Signed)
Subjective:  Patient ID: Lori Mclaughlin, female    DOB: [birth year]  Age: 54 y.o. MRN: [id number]  CC: Diabetes   HPI Lori Mclaughlin presents for f/up on DM2 - she has not been able to pay for her meds recently. She complains of polyuria but otherwise she feels well.  Outpatient Prescriptions Prior to Visit  Medication Sig Dispense Refill  . acetaminophen (TYLENOL) 500 MG tablet Take 1,000 mg by mouth every 6 (six) hours as needed for mild pain, moderate pain or headache.    [name]. amLODipine (NORVASC) 10 MG tablet Take 1 tablet (10 mg total) by mouth daily. 90 tablet 3  . aspirin EC 81 MG EC tablet Take 1 tablet (81 mg total) by mouth daily.    . carvedilol (COREG) 12.5 MG tablet Take 1 tablet (12.5 mg total) by mouth 2 (two) times daily with a meal. (Patient not taking: Reported on 06/12/2015) 60 tablet 11  . cloNIDine (CATAPRES-TTS-2) 0.2 mg/24hr patch Place 1 patch (0.2 mg total) onto the skin once a week. (Patient not taking: Reported on 06/12/2015) 4 patch 12  . Insulin Detemir (LEVEMIR FLEXTOUCH) 100 UNIT/ML Pen Inject 75 Units into the skin daily at 10 pm. 15 mL 11  . Multiple Vitamins-Minerals (MULTIVITAMIN WITH MINERALS) tablet Take 1 tablet by mouth daily.    . sitaGLIPtin-metformin (JANUMET) 50-1000 MG per tablet Take 1 tablet by mouth 2 (two) times daily with a meal. 60 tablet 11  . triamterene-hydrochlorothiazide (MAXZIDE-25) 37.5-25 MG per tablet Take 1 tablet by mouth daily. 90 tablet 0  . fluconazole (DIFLUCAN) 150 MG tablet Take 1 tablet (150 mg total) by mouth once. With 3 refills 1 tablet 3   No facility-administered medications prior to visit.    ROS Review of Systems  Constitutional: Negative.  Negative for fever, chills, diaphoresis, appetite change and fatigue.  HENT: Negative.   Eyes: Negative.   Respiratory: Negative.  Negative for cough, choking, chest tightness, shortness of breath and stridor.   Cardiovascular: Negative.  Negative for chest  pain, palpitations and leg swelling.  Gastrointestinal: Negative.  Negative for nausea, vomiting, abdominal pain, diarrhea, constipation and blood in stool.  Endocrine: Positive for polyuria. Negative for polydipsia and polyphagia.  Genitourinary: Negative.   Musculoskeletal: Negative.   Skin: Negative.   Allergic/Immunologic: Negative.   Neurological: Negative.  Negative for dizziness, tremors, weakness, light-headedness and numbness.  Hematological: Negative.  Negative for adenopathy. Does not bruise/bleed easily.  Psychiatric/Behavioral: Negative.     Objective:  BP 148/88 mmHg  Pulse 77  Temp(Src) 97.8 F (36.6 C) (Oral)  Resp 16  Ht 5\' 1"  (1.549 m)  Wt 258 lb (117.028 kg)  BMI 48.77 kg/m2  SpO2 95%  LMP 02/23/2012  BP Readings from Last 3 Encounters:  06/12/15 192/115  06/12/15 148/88  11/27/14 186/89    Wt Readings from Last 3 Encounters:  06/12/15 258 lb (117.028 kg)  01/15/15 271 lb (122.925 kg)  04/11/14 277 lb 9.6 oz (125.919 kg)    Physical Exam  Constitutional: She is oriented to person, place, and time. No distress.  HENT:  Mouth/Throat: Oropharynx is clear and moist. No oropharyngeal exudate.  Eyes: Conjunctivae are normal. Right eye exhibits no discharge. Left eye exhibits no discharge. No scleral icterus.  Neck: Normal range of motion. Neck supple. No JVD present. No tracheal deviation present. No thyromegaly present.  Cardiovascular: Normal rate, regular rhythm, normal heart sounds and intact distal pulses.  Exam reveals no gallop and no friction rub.  No murmur heard. Pulmonary/Chest: Effort normal and breath sounds normal. No stridor. No respiratory distress. She has no wheezes. She has no rales. She exhibits no tenderness.  Abdominal: Soft. Bowel sounds are normal. She exhibits no distension and no mass. There is no tenderness. There is no rebound and no guarding.  Musculoskeletal: Normal range of motion. She exhibits no edema or tenderness.    Lymphadenopathy:    She has no cervical adenopathy.  Neurological: She is oriented to person, place, and time.  Skin: Skin is warm and dry. No rash noted. She is not diaphoretic. No erythema. No pallor.    Lab Results  Component Value Date   WBC 9.2 06/12/2015   HGB 13.3 06/12/2015   HCT 40.4 06/12/2015   PLT 200 06/12/2015   GLUCOSE 648* 06/12/2015   CHOL 158 06/12/2015   TRIG 89.0 06/12/2015   HDL 50.00 06/12/2015   LDLCALC 90 06/12/2015   ALT 15 11/27/2014   AST 16 11/27/2014   NA 130* 06/12/2015   K 4.1 06/12/2015   CL 92* 06/12/2015   CREATININE 1.41* 06/12/2015   BUN 21* 06/12/2015   CO2 26 06/12/2015   TSH 2.29 06/12/2015   INR 0.96 03/30/2014   HGBA1C 17.7* 06/12/2015   MICROALBUR 18.7* 01/15/2015    Mr Mra Head Wo Contrast  11/27/2014  CLINICAL DATA:  CVA.  Dizziness. EXAM: MR HEAD WITHOUT CONTRAST MR CIRCLE OF WILLIS WITHOUT CONTRAST MRA OF THE NECK WITHOUT AND WITH CONTRAST TECHNIQUE: Multiplanar, multiecho pulse sequences of the brain and surrounding structures were obtained according to standard protocol without intravenous contrast.; Angiographic images of the Circle of Willis were obtained using MRA technique without intravenous contrast.; Multiplanar and multiecho pulse sequences of the neck were obtained without and with intravenous contrast. Angiographic images of the neck were obtained using MRA technique without and with intravenous contast. CONTRAST:  20mL MULTIHANCE GADOBENATE DIMEGLUMINE 529 MG/ML IV SOLN COMPARISON:  None FINDINGS: MR HEAD FINDINGS Calvarium and upper cervical spine: No marrow signal abnormality. Orbits: No significant findings. Sinuses: Probable mucous retention cysts in the bilateral maxillary antra, nonobstructive. Brain: No acute abnormality such as acute infarct, hemorrhage, hydrocephalus, or mass lesion. No evidence of large vessel occlusion. Mild patchy bilateral cerebral white matter T2 and FLAIR hyperintensity, nonspecific but usually  from chronic vessel disease. The largest low-density in the left frontal deep white matter is stable since at least 2010 head CT. MR CIRCLE OF WILLIS FINDINGS Mild dominance of the left vertebral artery. The PICAS are widely patent. AICAsis not visible. Symmetric and normal appearing superior cerebellar and PCAs. Balanced anterior circulation with anterior communicating artery present. There is trifurcation of the A2 segments. Bilateral posterior communicating arteries with a 1 mm outpouching on the left likely infundibulum. There are tandem moderate narrowings of the right M1 segment. No surrounding subarachnoid hemorrhage on FLAIR imaging or evidence of intramural hematoma and T1 weighted imaging. This is presumably atherosclerosis given the patient's clinical risk factors. No other notable stenoses. No major vessel occlusion. MRA NECK FINDINGS Normal aortic branching. On time-of-flight imaging there is antegrade flow in the bilateral vertebral and carotid systems. No evidence of stenosis or dissection involving the bilateral carotid or vertebral systems. IMPRESSION: 1. No acute intracranial findings, including infarct. 2. Chronic mild white matter disease, usually from small vessel ischemia given history of hypertension and diabetes. 3. Tandem moderate right M1 stenosis. 4. 1 mm outpouching from the left supraclinoid carotid, favor infundibulum over aneurysm. 5. No arterial stenosis in the neck.  Electronically Signed   By: Marnee Spring M.D.   On: 11/27/2014 12:30   Mr Angiogram Neck W Wo Contrast  11/27/2014  CLINICAL DATA:  CVA.  Dizziness. EXAM: MR HEAD WITHOUT CONTRAST MR CIRCLE OF WILLIS WITHOUT CONTRAST MRA OF THE NECK WITHOUT AND WITH CONTRAST TECHNIQUE: Multiplanar, multiecho pulse sequences of the brain and surrounding structures were obtained according to standard protocol without intravenous contrast.; Angiographic images of the Circle of Willis were obtained using MRA technique without intravenous  contrast.; Multiplanar and multiecho pulse sequences of the neck were obtained without and with intravenous contrast. Angiographic images of the neck were obtained using MRA technique without and with intravenous contast. CONTRAST:  20mL MULTIHANCE GADOBENATE DIMEGLUMINE 529 MG/ML IV SOLN COMPARISON:  None FINDINGS: MR HEAD FINDINGS Calvarium and upper cervical spine: No marrow signal abnormality. Orbits: No significant findings. Sinuses: Probable mucous retention cysts in the bilateral maxillary antra, nonobstructive. Brain: No acute abnormality such as acute infarct, hemorrhage, hydrocephalus, or mass lesion. No evidence of large vessel occlusion. Mild patchy bilateral cerebral white matter T2 and FLAIR hyperintensity, nonspecific but usually from chronic vessel disease. The largest low-density in the left frontal deep white matter is stable since at least 2010 head CT. MR CIRCLE OF WILLIS FINDINGS Mild dominance of the left vertebral artery. The PICAS are widely patent. AICAsis not visible. Symmetric and normal appearing superior cerebellar and PCAs. Balanced anterior circulation with anterior communicating artery present. There is trifurcation of the A2 segments. Bilateral posterior communicating arteries with a 1 mm outpouching on the left likely infundibulum. There are tandem moderate narrowings of the right M1 segment. No surrounding subarachnoid hemorrhage on FLAIR imaging or evidence of intramural hematoma and T1 weighted imaging. This is presumably atherosclerosis given the patient's clinical risk factors. No other notable stenoses. No major vessel occlusion. MRA NECK FINDINGS Normal aortic branching. On time-of-flight imaging there is antegrade flow in the bilateral vertebral and carotid systems. No evidence of stenosis or dissection involving the bilateral carotid or vertebral systems. IMPRESSION: 1. No acute intracranial findings, including infarct. 2. Chronic mild white matter disease, usually from  small vessel ischemia given history of hypertension and diabetes. 3. Tandem moderate right M1 stenosis. 4. 1 mm outpouching from the left supraclinoid carotid, favor infundibulum over aneurysm. 5. No arterial stenosis in the neck. Electronically Signed   By: Marnee Spring M.D.   On: 11/27/2014 12:30   Mr Brain Wo Contrast  11/27/2014  CLINICAL DATA:  CVA.  Dizziness. EXAM: MR HEAD WITHOUT CONTRAST MR CIRCLE OF WILLIS WITHOUT CONTRAST MRA OF THE NECK WITHOUT AND WITH CONTRAST TECHNIQUE: Multiplanar, multiecho pulse sequences of the brain and surrounding structures were obtained according to standard protocol without intravenous contrast.; Angiographic images of the Circle of Willis were obtained using MRA technique without intravenous contrast.; Multiplanar and multiecho pulse sequences of the neck were obtained without and with intravenous contrast. Angiographic images of the neck were obtained using MRA technique without and with intravenous contast. CONTRAST:  20mL MULTIHANCE GADOBENATE DIMEGLUMINE 529 MG/ML IV SOLN COMPARISON:  None FINDINGS: MR HEAD FINDINGS Calvarium and upper cervical spine: No marrow signal abnormality. Orbits: No significant findings. Sinuses: Probable mucous retention cysts in the bilateral maxillary antra, nonobstructive. Brain: No acute abnormality such as acute infarct, hemorrhage, hydrocephalus, or mass lesion. No evidence of large vessel occlusion. Mild patchy bilateral cerebral white matter T2 and FLAIR hyperintensity, nonspecific but usually from chronic vessel disease. The largest low-density in the left frontal deep white matter is stable since  at least 2010 head CT. MR CIRCLE OF WILLIS FINDINGS Mild dominance of the left vertebral artery. The PICAS are widely patent. AICAsis not visible. Symmetric and normal appearing superior cerebellar and PCAs. Balanced anterior circulation with anterior communicating artery present. There is trifurcation of the A2 segments. Bilateral  posterior communicating arteries with a 1 mm outpouching on the left likely infundibulum. There are tandem moderate narrowings of the right M1 segment. No surrounding subarachnoid hemorrhage on FLAIR imaging or evidence of intramural hematoma and T1 weighted imaging. This is presumably atherosclerosis given the patient's clinical risk factors. No other notable stenoses. No major vessel occlusion. MRA NECK FINDINGS Normal aortic branching. On time-of-flight imaging there is antegrade flow in the bilateral vertebral and carotid systems. No evidence of stenosis or dissection involving the bilateral carotid or vertebral systems. IMPRESSION: 1. No acute intracranial findings, including infarct. 2. Chronic mild white matter disease, usually from small vessel ischemia given history of hypertension and diabetes. 3. Tandem moderate right M1 stenosis. 4. 1 mm outpouching from the left supraclinoid carotid, favor infundibulum over aneurysm. 5. No arterial stenosis in the neck. Electronically Signed   By: Marnee Spring M.D.   On: 11/27/2014 12:30    Assessment & Plan:   Mariea was seen today for diabetes.  Diagnoses and all orders for this visit:  Uncontrolled type 2 diabetes mellitus with diabetic nephropathy, without long-term current use of insulin (HCC)- her blood sugars are very high, will refer to the ER for urgent evaluation and then to ENDO for ongoing management, I have her samples of janumet and lantus -     Basic metabolic panel; Future -     Hemoglobin A1c; Future -     Ambulatory referral to Endocrinology -     Amb Referral to Nutrition and Diabetic E  Essential hypertension, malignant- her BP is well controlled, renal function is stable -     Basic metabolic panel; Future  Hyperlipidemia with target LDL less than 70- she has not achieved her LDL goal , I have asked her to start a statin -     TSH; Future -     Lipid panel; Future  Screen for colon cancer -     Ambulatory referral to  Gastroenterology  Visit for screening mammogram -     MM DIGITAL SCREENING BILATERAL; Future  Need for Tdap vaccination -     Tdap vaccine greater than or equal to 7yo IM  Need for influenza vaccination -     Flu Vaccine QUAD 36+ mos IM   I have discontinued Ms. Mclaughlin's fluconazole. I am also having her maintain her multivitamin with minerals, aspirin, acetaminophen, triamterene-hydrochlorothiazide, Insulin Detemir, cloNIDine, carvedilol, amLODipine, and sitaGLIPtin-metformin.  No orders of the defined types were placed in this encounter.     Follow-up: Return in about 4 months (around 10/10/2015).  Sanda Linger, MD

## 2015-06-12 NOTE — Telephone Encounter (Signed)
I have sent her a MyChart message Please ask her to go to the ER to consider treatment with aggressive insulin therapy

## 2015-06-12 NOTE — Telephone Encounter (Signed)
Pt informed. Was told to go to ED. Pt complied

## 2015-06-12 NOTE — Progress Notes (Signed)
Pre visit review using our clinic review tool, if applicable. No additional management support is needed unless otherwise documented below in the visit note. 

## 2015-06-12 NOTE — Telephone Encounter (Signed)
Bronson CurbGil called from the lab.   Critical - Glucose is 604.   Called PCP directly with the result.

## 2015-06-12 NOTE — Discharge Instructions (Signed)
Hyperglycemia °Hyperglycemia occurs when the glucose (sugar) in your blood is too high. Hyperglycemia can happen for many reasons, but it most often happens to people who do not know they have diabetes or are not managing their diabetes properly.  °CAUSES  °Whether you have diabetes or not, there are other causes of hyperglycemia. Hyperglycemia can occur when you have diabetes, but it can also occur in other situations that you might not be as aware of, such as: °Diabetes °· If you have diabetes and are having problems controlling your blood glucose, hyperglycemia could occur because of some of the following reasons: °¨ Not following your meal plan. °¨ Not taking your diabetes medications or not taking it properly. °¨ Exercising less or doing less activity than you normally do. °¨ Being sick. °Pre-diabetes °· This cannot be ignored. Before people develop Type 2 diabetes, they almost always have "pre-diabetes." This is when your blood glucose levels are higher than normal, but not yet high enough to be diagnosed as diabetes. Research has shown that some long-term damage to the body, especially the heart and circulatory system, may already be occurring during pre-diabetes. If you take action to manage your blood glucose when you have pre-diabetes, you may delay or prevent Type 2 diabetes from developing. °Stress °· If you have diabetes, you may be "diet" controlled or on oral medications or insulin to control your diabetes. However, you may find that your blood glucose is higher than usual in the hospital whether you have diabetes or not. This is often referred to as "stress hyperglycemia." Stress can elevate your blood glucose. This happens because of hormones put out by the body during times of stress. If stress has been the cause of your high blood glucose, it can be followed regularly by your caregiver. That way he/she can make sure your hyperglycemia does not continue to get worse or progress to  diabetes. °Steroids °· Steroids are medications that act on the infection fighting system (immune system) to block inflammation or infection. One side effect can be a rise in blood glucose. Most people can produce enough extra insulin to allow for this rise, but for those who cannot, steroids make blood glucose levels go even higher. It is not unusual for steroid treatments to "uncover" diabetes that is developing. It is not always possible to determine if the hyperglycemia will go away after the steroids are stopped. A special blood test called an A1c is sometimes done to determine if your blood glucose was elevated before the steroids were started. °SYMPTOMS °· Thirsty. °· Frequent urination. °· Dry mouth. °· Blurred vision. °· Tired or fatigue. °· Weakness. °· Sleepy. °· Tingling in feet or leg. °DIAGNOSIS  °Diagnosis is made by monitoring blood glucose in one or all of the following ways: °· A1c test. This is a chemical found in your blood. °· Fingerstick blood glucose monitoring. °· Laboratory results. °TREATMENT  °First, knowing the cause of the hyperglycemia is important before the hyperglycemia can be treated. Treatment may include, but is not be limited to: °· Education. °· Change or adjustment in medications. °· Change or adjustment in meal plan. °· Treatment for an illness, infection, etc. °· More frequent blood glucose monitoring. °· Change in exercise plan. °· Decreasing or stopping steroids. °· Lifestyle changes. °HOME CARE INSTRUCTIONS  °· Test your blood glucose as directed. °· Exercise regularly. Your caregiver will give you instructions about exercise. Pre-diabetes or diabetes which comes on with stress is helped by exercising. °· Eat wholesome,   balanced meals. Eat often and at regular, fixed times. Your caregiver or nutritionist will give you a meal plan to guide your sugar intake. °· Being at an ideal weight is important. If needed, losing as little as 10 to 15 pounds may help improve blood  glucose levels. °SEEK MEDICAL CARE IF:  °· You have questions about medicine, activity, or diet. °· You continue to have symptoms (problems such as increased thirst, urination, or weight gain). °SEEK IMMEDIATE MEDICAL CARE IF:  °· You are vomiting or have diarrhea. °· Your breath smells fruity. °· You are breathing faster or slower. °· You are very sleepy or incoherent. °· You have numbness, tingling, or pain in your feet or hands. °· You have chest pain. °· Your symptoms get worse even though you have been following your caregiver's orders. °· If you have any other questions or concerns. °  °This information is not intended to replace advice given to you by your health care provider. Make sure you discuss any questions you have with your health care provider. °  °Document Released: 01/06/2001 Document Revised: 10/05/2011 Document Reviewed: 03/19/2015 °[employer]vier Interactive Patient Education ©2016 [employer] ° °

## 2015-06-12 NOTE — ED Notes (Signed)
Pt c/o hyperglycemia, generalized chest discomfort, and nausea starting today.  Pain score 5/10.  Pt reports having A1C checked this morning.  Sts she was called by her PCP and told to come to the ED, due the glucose 604.  Pt reports similar chest discomfort w/ elevated blood pressure.

## 2015-06-12 NOTE — ED Provider Notes (Signed)
CSN: [id number]     Arrival date & time 06/12/15  1536 History   First MD Initiated Contact with Patient 06/12/15 1602     Chief Complaint  Patient presents with  . Hyperglycemia  . Chest Pain     (Consider location/radiation/quality/duration/timing/severity/associated sxs/prior Treatment) Patient is a 54 y.o. female presenting with hyperglycemia and chest pain. The history is provided by the patient.  Hyperglycemia Associated symptoms: chest pain, fatigue, increased thirst and polyuria   Associated symptoms: no abdominal pain, no nausea, no shortness of breath, no vomiting and no weakness   Chest Pain Associated symptoms: fatigue   Associated symptoms: no abdominal pain, no back pain, no headache, no nausea, no numbness, no shortness of breath, not vomiting and no weakness    patient sent in for hyperglycemia. Followed up with her primary care doctor today because she needs a refill on some of her diabetes medicines. States she's been on it for about a month. Found to have sugar of 600 there. States she's been urinating frequently and Thursday. States she feels little weak. She's had occasional dull chest pain. No dysuria but has had frequency. Slight abdominal pain. She states he does have soreness in her arms but has been working out recently.  Past Medical History  Diagnosis Date  . Allergic rhinitis   . Microcytic anemia   . Asthma   . Diabetes mellitus type II   . GERD (gastroesophageal reflux disease)   . Hypertension   . Osteoarthritis   . Headache(784.0)     when my bp is up  . CKD (chronic kidney disease), stage II   . Atypical chest pain     a. 03/2014: normal nuclear stress test.  . Hypertensive heart disease   . Chronic diastolic CHF (congestive heart failure) (HCC)     a. Dx 03/2014 with echo - moderate LVH, EF 55-60%, grade 1 diastolic dysfunction, mildly dilated LA.   Past Surgical History  Procedure Laterality Date  . Tubal ligation    . Shoulder arthroscopy  Bilateral    Family History  Problem Relation Age of Onset  . Diabetes Other   . Hypertension Other   . Stroke Other   . Cancer Other     colon; prostate  . Stroke Mother   . Cancer Father   . Hypertension Brother   . Kidney disease Neg Hx   . Alcohol abuse Neg Hx   . Asthma Neg Hx   . COPD Neg Hx   . Depression Neg Hx   . Drug abuse Neg Hx   . Early death Neg Hx   . Hearing loss Neg Hx   . Heart disease Neg Hx   . Hyperlipidemia Neg Hx    Social History  Substance Use Topics  . Smoking status: Former Smoker    Quit date: 08/29/2010  . Smokeless tobacco: Never Used  . Alcohol Use: No     Comment: quit 4 years   OB History    No data available     Review of Systems  Constitutional: Positive for fatigue. Negative for activity change and appetite change.  Eyes: Negative for pain.  Respiratory: Negative for chest tightness and shortness of breath.   Cardiovascular: Positive for chest pain. Negative for leg swelling.  Gastrointestinal: Negative for nausea, vomiting, abdominal pain and diarrhea.  Endocrine: Positive for polydipsia, polyphagia and polyuria.  Genitourinary: Positive for frequency. Negative for flank pain.  Musculoskeletal: Negative for back pain and neck stiffness.  Skin: Negative  for rash.  Neurological: Negative for weakness, numbness and headaches.  Psychiatric/Behavioral: Negative for behavioral problems.      Allergies  Latex and Lisinopril  Home Medications   Prior to Admission medications   Medication Sig Start Date End Date Taking? Authorizing Provider  amLODipine (NORVASC) 10 MG tablet Take 1 tablet (10 mg total) by mouth daily. 01/15/15  Yes Etta Grandchild, MD  aspirin EC 81 MG EC tablet Take 1 tablet (81 mg total) by mouth daily. 04/01/14  Yes Dayna N Dunn, PA-C  aspirin-sod bicarb-citric acid (ALKA-SELTZER) 325 MG TBEF tablet Take 325 mg by mouth at bedtime.   Yes Historical Provider, MD  Insulin Detemir (LEVEMIR FLEXTOUCH) 100 UNIT/ML  Pen Inject 75 Units into the skin daily at 10 pm. 01/15/15  Yes Etta Grandchild, MD  Multiple Vitamins-Minerals (MULTIVITAMIN WITH MINERALS) tablet Take 1 tablet by mouth daily.   Yes Historical Provider, MD  sitaGLIPtin-metformin (JANUMET) 50-1000 MG per tablet Take 1 tablet by mouth 2 (two) times daily with a meal. 01/15/15  Yes Etta Grandchild, MD  triamterene-hydrochlorothiazide (MAXZIDE-25) 37.5-25 MG per tablet Take 1 tablet by mouth daily. 11/30/14  Yes Etta Grandchild, MD  acetaminophen (TYLENOL) 500 MG tablet Take 1,000 mg by mouth every 6 (six) hours as needed for mild pain, moderate pain or headache.    Historical Provider, MD  carvedilol (COREG) 12.5 MG tablet Take 1 tablet (12.5 mg total) by mouth 2 (two) times daily with a meal. Patient not taking: Reported on 06/12/2015 01/15/15   Etta Grandchild, MD  cloNIDine (CATAPRES-TTS-2) 0.2 mg/24hr patch Place 1 patch (0.2 mg total) onto the skin once a week. Patient not taking: Reported on 06/12/2015 01/15/15   Etta Grandchild, MD   BP 192/115 mmHg  Pulse 96  Temp(Src) 98.3 F (36.8 C) (Oral)  Resp 20  SpO2 98%  LMP 02/23/2012 Physical Exam  Constitutional: She is oriented to person, place, and time. She appears well-developed and well-nourished.  HENT:  Head: Normocephalic and atraumatic.  Eyes: Pupils are equal, round, and reactive to light.  Neck: Normal range of motion. Neck supple.  Cardiovascular: Regular rhythm and normal heart sounds.   No murmur heard. Mild tachycardia  Pulmonary/Chest: Effort normal and breath sounds normal. No respiratory distress. She has no wheezes. She has no rales.  Abdominal: Soft. Bowel sounds are normal. She exhibits no distension. There is no tenderness. There is no rebound.  Musculoskeletal: Normal range of motion.  Neurological: She is alert and oriented to person, place, and time. No cranial nerve deficit.  Skin: Skin is warm and dry.  Psychiatric: She has a normal mood and affect. Her speech is  normal.  Nursing note and vitals reviewed.   ED Course  Procedures (including critical care time) Labs Review Labs Reviewed  BASIC METABOLIC PANEL - Abnormal; Notable for the following:    Sodium 130 (*)    Chloride 92 (*)    Glucose, Bld 648 (*)    BUN 21 (*)    Creatinine, Ser 1.41 (*)    GFR calc non Af Amer 41 (*)    GFR calc Af Amer 47 (*)    All other components within normal limits  CBC - Abnormal; Notable for the following:    RBC 5.39 (*)    MCV 75.0 (*)    MCH 24.7 (*)    All other components within normal limits  URINALYSIS, ROUTINE W REFLEX MICROSCOPIC (NOT AT [facility]) - Abnormal; Notable for the  following:    Specific Gravity, Urine 1.031 (*)    Glucose, UA >1000 (*)    All other components within normal limits  URINE MICROSCOPIC-ADD ON - Abnormal; Notable for the following:    Squamous Epithelial / LPF 0-5 (*)    All other components within normal limits  CBG MONITORING, ED - Abnormal; Notable for the following:    Glucose-Capillary >600 (*)    All other components within normal limits  CBG MONITORING, ED - Abnormal; Notable for the following:    Glucose-Capillary 456 (*)    All other components within normal limits  CBG MONITORING, ED - Abnormal; Notable for the following:    Glucose-Capillary 216 (*)    All other components within normal limits  I-STAT TROPOININ, ED  I-STAT TROPOININ, ED  CBG MONITORING, ED    Imaging Review Dg Chest 2 View  06/12/2015  CLINICAL DATA:  Hyperglycemia with glucose = 604, generalized chest discomfort and nausea starting today, elevated blood pressure, history asthma, CHF, type II diabetes mellitus, hypertension, former smoker EXAM: CHEST  2 VIEW COMPARISON:  03/31/2014 FINDINGS: Enlargement of cardiac silhouette. Mediastinal contours and pulmonary vascularity normal. With minimal chronic central peribronchial thickening. No pulmonary infiltrate, pleural effusion or pneumothorax. Bones unremarkable. IMPRESSION: Enlargement of  cardiac silhouette. No acute abnormalities. Electronically Signed   By: Ulyses Southward M.D.   On: 06/12/2015 17:25   I have personally reviewed and evaluated these images and lab results as part of my medical decision-making.   EKG Interpretation None     ED ECG REPORT   Date: 06/12/2015  Rate: 98  Rhythm: normal sinus rhythm  QRS Axis: left  Intervals: normal  ST/T Wave abnormalities: normal  Conduction Disutrbances:none  Narrative Interpretation:   Old EKG Reviewed: unchanged    MDM   Final diagnoses:  Hyperglycemia    Patient presents with hyperglycemia. She's been off her medicine. Hyperglycemia without DKA. Sugar improved. Started back on medicine by her PCP. Discharge home.    Benjiman Core, MD 06/12/15 [phone]

## 2015-06-12 NOTE — ED Notes (Signed)
Pt ambulated to RR unassisted 

## 2015-06-13 MED ORDER — ATORVASTATIN CALCIUM 20 MG PO TABS: 20.0000 mg | ORAL_TABLET | Freq: Every day | ORAL | Status: DC

## 2015-06-21 ENCOUNTER — Other Ambulatory Visit: Payer: Self-pay | Admitting: Physician Assistant

## 2015-06-25 ENCOUNTER — Telehealth: Payer: Self-pay | Admitting: Physician Assistant

## 2015-06-25 NOTE — Telephone Encounter (Signed)
Received med refill order from our office from [facility] Lori Mclaughlin for the patient's losartan. I sent her the following message and called her to make her aware of the following --  Hi Linda,  I saw you sent in an order for me to cosign on this patient's Losartan. I declined this order because we have not been following this patient and therefore this order should come through her PCP.   I discharged her in 03/2014 but we have not seen the patient in the office since that time. She also has kidney insufficiency that needs to be followed while on this medicine, which has not been followed up through our office.   Please ask her to f/u PCP or have her schedule f/u with our office.  Deontaye Civello PA-C

## 2015-09-02 LAB — HM DIABETES EYE EXAM

## 2015-09-16 ENCOUNTER — Encounter: Payer: Self-pay | Admitting: Internal Medicine

## 2015-10-03 ENCOUNTER — Ambulatory Visit: Payer: PRIVATE HEALTH INSURANCE

## 2015-10-23 ENCOUNTER — Telehealth: Payer: Self-pay | Admitting: Internal Medicine

## 2015-10-23 NOTE — Telephone Encounter (Signed)
Notified pt will leave samples for pick-up...Raechel Chute/lmb

## 2015-10-23 NOTE — Telephone Encounter (Signed)
Patient called in and advised that she has been leaving messages requesting janument samples with triage for weeks. States that she has been out of her medication for 3 weeks. Do you have samples to give to her?   Patient states that she is unable to come in for an appointment because of her financial situation

## 2015-10-23 NOTE — Telephone Encounter (Signed)
Yes, I do have samples

## 2016-04-07 ENCOUNTER — Ambulatory Visit: Payer: PRIVATE HEALTH INSURANCE | Admitting: Internal Medicine

## 2016-04-07 ENCOUNTER — Telehealth: Payer: Self-pay | Admitting: Internal Medicine

## 2016-04-07 NOTE — Telephone Encounter (Signed)
Patient no showed for A1C follow up on 9/12.  Please advise.

## 2016-04-07 NOTE — Telephone Encounter (Signed)
Call her 

## 2016-04-08 NOTE — Telephone Encounter (Signed)
Left patient vm to call back to schedule.  °

## 2016-04-24 LAB — HM DIABETES EYE EXAM

## 2016-04-27 ENCOUNTER — Encounter: Payer: Self-pay | Admitting: Internal Medicine

## 2016-04-27 NOTE — Progress Notes (Signed)
09.29.2017

## 2016-04-28 ENCOUNTER — Encounter: Payer: Self-pay | Admitting: Internal Medicine

## 2016-04-28 ENCOUNTER — Ambulatory Visit (INDEPENDENT_AMBULATORY_CARE_PROVIDER_SITE_OTHER): Payer: PRIVATE HEALTH INSURANCE | Admitting: Internal Medicine

## 2016-04-28 ENCOUNTER — Other Ambulatory Visit: Payer: Self-pay

## 2016-04-28 ENCOUNTER — Other Ambulatory Visit (INDEPENDENT_AMBULATORY_CARE_PROVIDER_SITE_OTHER): Payer: Self-pay

## 2016-04-28 VITALS — BP 150/120 | HR 75 | Temp 98.4°F | Resp 16 | Ht 61.0 in | Wt 265.2 lb

## 2016-04-28 DIAGNOSIS — E1121 Type 2 diabetes mellitus with diabetic nephropathy: Secondary | ICD-10-CM

## 2016-04-28 DIAGNOSIS — I10 Essential (primary) hypertension: Secondary | ICD-10-CM

## 2016-04-28 DIAGNOSIS — E785 Hyperlipidemia, unspecified: Secondary | ICD-10-CM

## 2016-04-28 DIAGNOSIS — IMO0002 Reserved for concepts with insufficient information to code with codable children: Secondary | ICD-10-CM

## 2016-04-28 DIAGNOSIS — E1165 Type 2 diabetes mellitus with hyperglycemia: Secondary | ICD-10-CM

## 2016-04-28 DIAGNOSIS — R001 Bradycardia, unspecified: Secondary | ICD-10-CM

## 2016-04-28 DIAGNOSIS — Z794 Long term (current) use of insulin: Secondary | ICD-10-CM

## 2016-04-28 DIAGNOSIS — E1129 Type 2 diabetes mellitus with other diabetic kidney complication: Secondary | ICD-10-CM

## 2016-04-28 DIAGNOSIS — G4730 Sleep apnea, unspecified: Secondary | ICD-10-CM

## 2016-04-28 DIAGNOSIS — T50905A Adverse effect of unspecified drugs, medicaments and biological substances, initial encounter: Secondary | ICD-10-CM

## 2016-04-28 DIAGNOSIS — Z23 Encounter for immunization: Secondary | ICD-10-CM

## 2016-04-28 LAB — COMPREHENSIVE METABOLIC PANEL
ALK PHOS: 118 U/L — AB (ref 39–117)
ALT: 11 U/L (ref 0–35)
AST: 11 U/L (ref 0–37)
Albumin: 3.5 g/dL (ref 3.5–5.2)
BUN: 20 mg/dL (ref 6–23)
CHLORIDE: 100 meq/L (ref 96–112)
CO2: 29 mEq/L (ref 19–32)
Calcium: 9 mg/dL (ref 8.4–10.5)
Creatinine, Ser: 1.31 mg/dL — ABNORMAL HIGH (ref 0.40–1.20)
GFR: 44.47 mL/min — ABNORMAL LOW (ref >60.00)
GLUCOSE: 376 mg/dL — AB (ref 70–99)
POTASSIUM: 4.1 meq/L (ref 3.5–5.1)
SODIUM: 137 meq/L (ref 135–145)
TOTAL PROTEIN: 8 g/dL (ref 6.0–8.3)
Total Bilirubin: 0.3 mg/dL (ref 0.2–1.2)

## 2016-04-28 LAB — MICROALBUMIN / CREATININE URINE RATIO
CREATININE, U: 187.4 mg/dL
MICROALB UR: 150.6 mg/dL — AB (ref 0.0–1.9)
MICROALB/CREAT RATIO: 80.4 mg/g — AB (ref 0.0–30.0)

## 2016-04-28 LAB — CBC WITH DIFFERENTIAL/PLATELET
BASOS PCT: 0.3 % (ref 0.0–3.0)
Basophils Absolute: 0 10*3/uL (ref 0.0–0.1)
EOS PCT: 5.6 % — AB (ref 0.0–5.0)
Eosinophils Absolute: 0.4 10*3/uL (ref 0.0–0.7)
HCT: 39.2 % (ref 36.0–46.0)
Hemoglobin: 12.7 g/dL (ref 12.0–15.0)
LYMPHS ABS: 1.7 10*3/uL (ref 0.7–4.0)
Lymphocytes Relative: 25.6 % (ref 12.0–46.0)
MCHC: 32.5 g/dL (ref 30.0–36.0)
MCV: 73.8 fl — ABNORMAL LOW (ref 78.0–100.0)
MONO ABS: 0.5 10*3/uL (ref 0.1–1.0)
Monocytes Relative: 7.8 % (ref 3.0–12.0)
NEUTROS PCT: 60.7 % (ref 43.0–77.0)
Neutro Abs: 4 10*3/uL (ref 1.4–7.7)
Platelets: 220 10*3/uL (ref 150.0–400.0)
RBC: 5.32 Mil/uL — ABNORMAL HIGH (ref 3.87–5.11)
RDW: 16.6 % — AB (ref 11.5–15.5)
WBC: 6.6 10*3/uL (ref 4.0–10.5)

## 2016-04-28 LAB — URINALYSIS, ROUTINE W REFLEX MICROSCOPIC
Bilirubin Urine: NEGATIVE
KETONES UR: NEGATIVE
Leukocytes, UA: NEGATIVE
Nitrite: NEGATIVE
Total Protein, Urine: 100 — AB
Urine Glucose: >=1000 — AB
Urobilinogen, UA: 0.2 (ref 0.0–1.0)
pH: 5.5 (ref 5.0–8.0)

## 2016-04-28 LAB — LIPID PANEL
Cholesterol: 175 mg/dL (ref 0–200)
HDL: 53 mg/dL (ref >39.00)
LDL Cholesterol: 98 mg/dL (ref 0–99)
NONHDL: 122.48
Total CHOL/HDL Ratio: 3
Triglycerides: 121 mg/dL (ref 0.0–149.0)
VLDL: 24.2 mg/dL (ref 0.0–40.0)

## 2016-04-28 LAB — TSH: TSH: 3.27 u[IU]/mL (ref 0.35–4.50)

## 2016-04-28 LAB — HEMOGLOBIN A1C: Hgb A1c MFr Bld: 12.8 % — ABNORMAL HIGH (ref 4.6–6.5)

## 2016-04-28 MED ORDER — INSULIN DETEMIR 100 UNIT/ML FLEXPEN: 75.0000 [IU] | PEN_INJECTOR | Freq: Every day | SUBCUTANEOUS | 11 refills | Status: DC

## 2016-04-28 MED ORDER — GLUCOSE BLOOD VI STRP: ORAL_STRIP | 11 refills | Status: DC

## 2016-04-28 MED ORDER — NEBIVOLOL HCL 10 MG PO TABS: 10.0000 mg | ORAL_TABLET | Freq: Every day | ORAL | 0 refills | Status: DC

## 2016-04-28 MED ORDER — AMLODIPINE BESYLATE 10 MG PO TABS: 10.0000 mg | ORAL_TABLET | Freq: Every day | ORAL | 3 refills | Status: DC

## 2016-04-28 MED ORDER — AZILSARTAN-CHLORTHALIDONE 40-25 MG PO TABS: 1.0000 | ORAL_TABLET | Freq: Every day | ORAL | 0 refills | Status: DC

## 2016-04-28 MED ORDER — SITAGLIPTIN PHOS-METFORMIN HCL 50-1000 MG PO TABS: 1.0000 | ORAL_TABLET | Freq: Two times a day (BID) | ORAL | 3 refills | Status: DC

## 2016-04-28 NOTE — Progress Notes (Signed)
Subjective:  Patient ID: Lori Mclaughlin, female    DOB: 1960/12/13  Age: 55 y.o. MRN: [id number]  CC: Diabetes; Hypertension; and Hyperlipidemia   HPI Lori Mclaughlin presents for follow-up on the above medical problems. She has not been able to treat her blood pressure and diabetes over the last year due to poor insurance coverage and financial constraints. Fortunately, she feels well today and offers no complaints. She denies headache, blurred vision, chest pain, shortness of breath, palpitations, edema, or fatigue. The only medication she's been taking recently has been amlodipine.   She doesn't think her blood sugars are well-controlled as she has had a few episodes of polydipsia but she denies visual disturbance, polyuria, polyphagia.  Outpatient Medications Prior to Visit  Medication Sig Dispense Refill  . acetaminophen (TYLENOL) 500 MG tablet Take 1,000 mg by mouth every 6 (six) hours as needed for mild pain, moderate pain or headache.    [name] aspirin EC 81 MG EC tablet Take 1 tablet (81 mg total) by mouth daily.    [name] aspirin-sod bicarb-citric acid (ALKA-SELTZER) 325 MG TBEF tablet Take 325 mg by mouth at bedtime.    [name] atorvastatin (LIPITOR) 20 MG tablet Take 1 tablet (20 mg total) by mouth daily. 90 tablet 3  . losartan (COZAAR) 50 MG tablet TAKE ONE TABLET BY MOUTH  DAILY 30 tablet 1  . Multiple Vitamins-Minerals (MULTIVITAMIN WITH MINERALS) tablet Take 1 tablet by mouth daily.    [name] amLODipine (NORVASC) 10 MG tablet Take 1 tablet (10 mg total) by mouth daily. 90 tablet 3  . carvedilol (COREG) 12.5 MG tablet Take 1 tablet (12.5 mg total) by mouth 2 (two) times daily with a meal. 60 tablet 11  . cloNIDine (CATAPRES-TTS-2) 0.2 mg/24hr patch Place 1 patch (0.2 mg total) onto the skin once a week. 4 patch 12  . Insulin Detemir (LEVEMIR FLEXTOUCH) 100 UNIT/ML Pen Inject 75 Units into the skin daily at 10 pm. 15 mL 11  . sitaGLIPtin-metformin (JANUMET) 50-1000 MG per tablet  Take 1 tablet by mouth 2 (two) times daily with a meal. 60 tablet 11  . triamterene-hydrochlorothiazide (MAXZIDE-25) 37.5-25 MG per tablet Take 1 tablet by mouth daily. 90 tablet 0   No facility-administered medications prior to visit.     ROS Review of Systems  Constitutional: Negative.  Negative for chills, diaphoresis, fatigue, fever and unexpected weight change.  HENT: Negative.  Negative for sinus pressure and trouble swallowing.   Eyes: Negative.  Negative for photophobia and visual disturbance.  Respiratory: Positive for apnea. Negative for cough, choking, chest tightness, shortness of breath, wheezing and stridor.   Cardiovascular: Negative.  Negative for chest pain, palpitations and leg swelling.  Gastrointestinal: Negative.  Negative for abdominal pain, constipation, diarrhea, nausea and vomiting.  Endocrine: Positive for polydipsia. Negative for cold intolerance, heat intolerance, polyphagia and polyuria.  Genitourinary: Negative.  Negative for decreased urine volume, difficulty urinating, dysuria, enuresis, flank pain and urgency.  Musculoskeletal: Negative.  Negative for arthralgias, back pain, myalgias and neck pain.  Skin: Negative.  Negative for color change and rash.  Allergic/Immunologic: Negative.   Neurological: Negative.  Negative for dizziness, tremors, weakness, light-headedness and numbness.  Hematological: Negative.  Negative for adenopathy. Does not bruise/bleed easily.  Psychiatric/Behavioral: Negative.     Objective:  BP (!) 150/120 (BP Location: Left Arm, Patient Position: Sitting, Cuff Size: Large)   Pulse 75   Temp 98.4 F (36.9 C) (Oral)   Resp 16   Ht 5\' 1"  (1.549  m)   Wt 265 lb 4 oz (120.3 kg)   LMP 02/23/2012   SpO2 98%   BMI 50.12 kg/m   BP Readings from Last 3 Encounters:  04/28/16 (!) 150/120  06/12/15 (!) 192/115  06/12/15 (!) 148/88    Wt Readings from Last 3 Encounters:  04/28/16 265 lb 4 oz (120.3 kg)  06/12/15 258 lb (117 kg)    01/15/15 271 lb (122.9 kg)    Physical Exam  Constitutional: She is oriented to person, place, and time. No distress.  HENT:  Mouth/Throat: Oropharynx is clear and moist. No oropharyngeal exudate.  Eyes: Conjunctivae are normal. Right eye exhibits no discharge. Left eye exhibits no discharge. No scleral icterus.  Neck: Normal range of motion. Neck supple. No JVD present. No tracheal deviation present. No thyromegaly present.  Cardiovascular: Normal rate, regular rhythm, normal heart sounds and intact distal pulses.  Exam reveals no gallop and no friction rub.   No murmur heard. EKG ---  Sinus  Rhythm  Voltage criteria for LVH  (R(I)+S(III) exceeds 2.00 mV).   -Nonspecific ST depression   +   T-abnormality  -Seen with left ventricular hypertrophy (strain) or digitalis effect  consider  Lateral ischemia.   ABNORMAL - no change from prior EKG  Pulmonary/Chest: Effort normal. No stridor. No respiratory distress. She has no wheezes. She has no rales. She exhibits no tenderness.  Abdominal: Soft. Bowel sounds are normal. She exhibits no distension and no mass. There is no tenderness. There is no rebound and no guarding.  Musculoskeletal: Normal range of motion. She exhibits no edema, tenderness or deformity.  Lymphadenopathy:    She has no cervical adenopathy.  Neurological: She is oriented to person, place, and time.  Skin: Skin is warm and dry. No rash noted. She is not diaphoretic. No erythema. No pallor.  Vitals reviewed.   Lab Results  Component Value Date   WBC 6.6 04/28/2016   HGB 12.7 04/28/2016   HCT 39.2 04/28/2016   PLT 220.0 04/28/2016   GLUCOSE 376 (H) 04/28/2016   CHOL 175 04/28/2016   TRIG 121.0 04/28/2016   HDL 53.00 04/28/2016   LDLCALC 98 04/28/2016   ALT 11 04/28/2016   AST 11 04/28/2016   NA 137 04/28/2016   K 4.1 04/28/2016   CL 100 04/28/2016   CREATININE 1.31 (H) 04/28/2016   BUN 20 04/28/2016   CO2 29 04/28/2016   TSH 3.27 04/28/2016   INR 0.96  03/30/2014   HGBA1C 12.8 (H) 04/28/2016   MICROALBUR 150.6 (H) 04/28/2016    Dg Chest 2 View  Result Date: 06/12/2015 CLINICAL DATA:  Hyperglycemia with glucose = 604, generalized chest discomfort and nausea starting today, elevated blood pressure, history asthma, CHF, type II diabetes mellitus, hypertension, former smoker EXAM: CHEST  2 VIEW COMPARISON:  03/31/2014 FINDINGS: Enlargement of cardiac silhouette. Mediastinal contours and pulmonary vascularity normal. With minimal chronic central peribronchial thickening. No pulmonary infiltrate, pleural effusion or pneumothorax. Bones unremarkable. IMPRESSION: Enlargement of cardiac silhouette. No acute abnormalities. Electronically Signed   By: Ulyses Southward M.D.   On: 06/12/2015 17:25    Assessment & Plan:   Solomia was seen today for diabetes, hypertension and hyperlipidemia.  Diagnoses and all orders for this visit:  Uncontrolled type 2 diabetes mellitus with other diabetic kidney complication, with long-term current use of insulin (HCC)- Her blood sugars are not well controlled, I will restart her usual medications, I also think she should be seen by endocrinology and diabetic education/nutrition. She will  restart insulin, DPP 4 inhibitor, and metformin. -     Comprehensive metabolic panel; Future -     Urinalysis, Routine w reflex microscopic ([facility]); Future -     Hemoglobin A1c; Future -     Microalbumin / creatinine urine ratio; Future -     Amb Referral to Nutrition and Diabetic E -     Ambulatory referral to Endocrinology  Hyperlipidemia with target LDL less than 70- she has achieved her LDL goal. -     Lipid panel; Future -     TSH; Future  Essential hypertension, malignant- her EKG confirms left ventricular hypertrophy and she has mild renal insufficiency, her blood pressure is not well controlled, will continue the CCB but will add on an ARB, alpha/beta blocker and thiazide diuretic.  -     Comprehensive metabolic panel;  Future -     CBC with Differential/Platelet; Future -     Urinalysis, Routine w reflex microscopic ([facility]); Future -     Azilsartan-Chlorthalidone (EDARBYCLOR) 40-25 MG TABS; Take 1 tablet by mouth daily. -     nebivolol (BYSTOLIC) 10 MG tablet; Take 1 tablet (10 mg total) by mouth daily. -     amLODipine (NORVASC) 10 MG tablet; Take 1 tablet (10 mg total) by mouth daily. -     EKG 12-Lead  Uncontrolled type 2 diabetes mellitus with diabetic nephropathy, with long-term current use of insulin (HCC) -     sitaGLIPtin-metformin (JANUMET) 50-1000 MG tablet; Take 1 tablet by mouth 2 (two) times daily with a meal. -     Insulin Detemir (LEVEMIR FLEXTOUCH) 100 UNIT/ML Pen; Inject 75 Units into the skin daily at 10 pm. -     Azilsartan-Chlorthalidone (EDARBYCLOR) 40-25 MG TABS; Take 1 tablet by mouth daily. -     glucose blood (ONETOUCH VERIO) test strip; Use TID  Sleep apnea, unspecified type -     Ambulatory referral to Pulmonology  Need for prophylactic vaccination and inoculation against influenza -     Flu Vaccine QUAD 36+ mos IM  Need for prophylactic vaccination against Streptococcus pneumoniae (pneumococcus) -     Pneumococcal polysaccharide vaccine 23-valent greater than or equal to 2yo subcutaneous/IM   I have discontinued Ms. Simeon CraftGilliam-Rowell's triamterene-hydrochlorothiazide, cloNIDine, and carvedilol. I have also changed her sitaGLIPtin-metformin. Additionally, I am having her start on Azilsartan-Chlorthalidone, nebivolol, and glucose blood. Lastly, I am having her maintain her multivitamin with minerals, aspirin, acetaminophen, aspirin-sod bicarb-citric acid, atorvastatin, losartan, Insulin Detemir, and amLODipine.  Meds ordered this encounter  Medications  . sitaGLIPtin-metformin (JANUMET) 50-1000 MG tablet    Sig: Take 1 tablet by mouth 2 (two) times daily with a meal.    Dispense:  60 tablet    Refill:  3  . Insulin Detemir (LEVEMIR FLEXTOUCH) 100 UNIT/ML Pen    Sig:  Inject 75 Units into the skin daily at 10 pm.    Dispense:  15 mL    Refill:  11  . Azilsartan-Chlorthalidone (EDARBYCLOR) 40-25 MG TABS    Sig: Take 1 tablet by mouth daily.    Dispense:  35 tablet    Refill:  0  . nebivolol (BYSTOLIC) 10 MG tablet    Sig: Take 1 tablet (10 mg total) by mouth daily.    Dispense:  35 tablet    Refill:  0  . amLODipine (NORVASC) 10 MG tablet    Sig: Take 1 tablet (10 mg total) by mouth daily.    Dispense:  90 tablet  Refill:  3  . glucose blood (ONETOUCH VERIO) test strip    Sig: Use TID    Dispense:  100 each    Refill:  11     Follow-up: Return in about 6 weeks (around 06/09/2016).  Sanda Linger, MD

## 2016-04-28 NOTE — Progress Notes (Signed)
Pre visit review using our clinic review tool, if applicable. No additional management support is needed unless otherwise documented below in the visit note. 

## 2016-04-28 NOTE — Patient Instructions (Signed)
Hypertension Hypertension, commonly called high blood pressure, is when the force of blood pumping through your arteries is too strong. Your arteries are the blood vessels that carry blood from your heart throughout your body. A blood pressure reading consists of a higher number over a lower number, such as 110/72. The higher number (systolic) is the pressure inside your arteries when your heart pumps. The lower number (diastolic) is the pressure inside your arteries when your heart relaxes. Ideally you want your blood pressure below 120/80. Hypertension forces your heart to work harder to pump blood. Your arteries may become narrow or stiff. Having untreated or uncontrolled hypertension can cause heart attack, stroke, kidney disease, and other problems. RISK FACTORS Some risk factors for high blood pressure are controllable. Others are not.  Risk factors you cannot control include:   Race. You may be at higher risk if you are African American.  Age. Risk increases with age.  Gender. Men are at higher risk than women before age 45 years. After age 65, women are at higher risk than men. Risk factors you can control include:  Not getting enough exercise or physical activity.  Being overweight.  Getting too much fat, sugar, calories, or salt in your diet.  Drinking too much alcohol. SIGNS AND SYMPTOMS Hypertension does not usually cause signs or symptoms. Extremely high blood pressure (hypertensive crisis) may cause headache, anxiety, shortness of breath, and nosebleed. DIAGNOSIS To check if you have hypertension, your health care provider will measure your blood pressure while you are seated, with your arm held at the level of your heart. It should be measured at least twice using the same arm. Certain conditions can cause a difference in blood pressure between your right and left arms. A blood pressure reading that is higher than normal on one occasion does not mean that you need treatment. If  it is not clear whether you have high blood pressure, you may be asked to return on a different day to have your blood pressure checked again. Or, you may be asked to monitor your blood pressure at home for 1 or more weeks. TREATMENT Treating high blood pressure includes making lifestyle changes and possibly taking medicine. Living a healthy lifestyle can help lower high blood pressure. You may need to change some of your habits. Lifestyle changes may include:  Following the DASH diet. This diet is high in fruits, vegetables, and whole grains. It is low in salt, red meat, and added sugars.  Keep your sodium intake below 2,300 mg per day.  Getting at least 30-45 minutes of aerobic exercise at least 4 times per week.  Losing weight if necessary.  Not smoking.  Limiting alcoholic beverages.  Learning ways to reduce stress. Your health care provider may prescribe medicine if lifestyle changes are not enough to get your blood pressure under control, and if one of the following is true:  You are 18-59 years of age and your systolic blood pressure is above 140.  You are 60 years of age or older, and your systolic blood pressure is above 150.  Your diastolic blood pressure is above 90.  You have diabetes, and your systolic blood pressure is over 140 or your diastolic blood pressure is over 90.  You have kidney disease and your blood pressure is above 140/90.  You have heart disease and your blood pressure is above 140/90. Your personal target blood pressure may vary depending on your medical conditions, your age, and other factors. HOME CARE INSTRUCTIONS    Have your blood pressure rechecked as directed by your health care provider.   Take medicines only as directed by your health care provider. Follow the directions carefully. Blood pressure medicines must be taken as prescribed. The medicine does not work as well when you skip doses. Skipping doses also puts you at risk for  problems.  Do not smoke.   Monitor your blood pressure at home as directed by your health care provider. SEEK MEDICAL CARE IF:   You think you are having a reaction to medicines taken.  You have recurrent headaches or feel dizzy.  You have swelling in your ankles.  You have trouble with your vision. SEEK IMMEDIATE MEDICAL CARE IF:  You develop a severe headache or confusion.  You have unusual weakness, numbness, or feel faint.  You have severe chest or abdominal pain.  You vomit repeatedly.  You have trouble breathing. MAKE SURE YOU:   Understand these instructions.  Will watch your condition.  Will get help right away if you are not doing well or get worse.   This information is not intended to replace advice given to you by your health care provider. Make sure you discuss any questions you have with your health care provider.   Document Released: 07/13/2005 Document Revised: 11/27/2014 Document Reviewed: 05/05/2013 Elsevier Interactive Patient Education 2016 [employer]  

## 2016-05-05 ENCOUNTER — Telehealth: Payer: Self-pay | Admitting: *Deleted

## 2016-05-05 MED ORDER — PEN NEEDLES 32G X 6 MM MISC: 1.0000 "pen " | Freq: Every day | 3 refills | Status: DC

## 2016-05-05 NOTE — Telephone Encounter (Signed)
Pt left msg on triage stating Md sent rx for levemir pen. She is needing the needles to administer med. Notified pt rx for pen needles has been sent to [facility]...Raechel Chute/lmb

## 2016-05-22 ENCOUNTER — Encounter: Payer: Self-pay | Admitting: Internal Medicine

## 2016-05-26 ENCOUNTER — Ambulatory Visit: Payer: Self-pay | Admitting: Internal Medicine

## 2016-06-01 ENCOUNTER — Ambulatory Visit (INDEPENDENT_AMBULATORY_CARE_PROVIDER_SITE_OTHER): Payer: 59 | Admitting: Internal Medicine

## 2016-06-01 VITALS — BP 120/78 | HR 55 | Temp 98.0°F | Resp 16 | Wt 278.0 lb

## 2016-06-01 DIAGNOSIS — E1129 Type 2 diabetes mellitus with other diabetic kidney complication: Secondary | ICD-10-CM | POA: Diagnosis not present

## 2016-06-01 DIAGNOSIS — E1121 Type 2 diabetes mellitus with diabetic nephropathy: Secondary | ICD-10-CM

## 2016-06-01 DIAGNOSIS — Z794 Long term (current) use of insulin: Secondary | ICD-10-CM

## 2016-06-01 DIAGNOSIS — I10 Essential (primary) hypertension: Secondary | ICD-10-CM | POA: Diagnosis not present

## 2016-06-01 DIAGNOSIS — E1165 Type 2 diabetes mellitus with hyperglycemia: Secondary | ICD-10-CM | POA: Diagnosis not present

## 2016-06-01 DIAGNOSIS — R809 Proteinuria, unspecified: Secondary | ICD-10-CM | POA: Diagnosis not present

## 2016-06-01 DIAGNOSIS — IMO0002 Reserved for concepts with insufficient information to code with codable children: Secondary | ICD-10-CM

## 2016-06-01 MED ORDER — AZILSARTAN-CHLORTHALIDONE 40-25 MG PO TABS: 1.0000 | ORAL_TABLET | Freq: Every day | ORAL | 11 refills | Status: DC

## 2016-06-01 MED ORDER — INSULIN DETEMIR 100 UNIT/ML FLEXPEN: 60.0000 [IU] | PEN_INJECTOR | Freq: Every day | SUBCUTANEOUS | 11 refills | Status: DC

## 2016-06-01 NOTE — Progress Notes (Signed)
Pre visit review using our clinic review tool, if applicable. No additional management support is needed unless otherwise documented below in the visit note. 

## 2016-06-01 NOTE — Patient Instructions (Signed)

## 2016-06-01 NOTE — Progress Notes (Signed)
Subjective:  Patient ID: Lori Mclaughlin, female    DOB: 1961/01/19  Age: 55 y.o. MRN: [id number]  CC: Hypertension and Diabetes   HPI Lori Mclaughlin presents for follow-up on hypertension and diabetes. She complains of symptomatic episodes of hypoglycemia in the morning. She has had a blood sugar down to the upper 50s and did not feel well. She otherwise offers no complaints. Her blood pressure is well-controlled and she denies headache/blurred vision/chest pain/shortness of breath.  Outpatient Medications Prior to Visit  Medication Sig Dispense Refill  . acetaminophen (TYLENOL) 500 MG tablet Take 1,000 mg by mouth every 6 (six) hours as needed for mild pain, moderate pain or headache.    [name] amLODipine (NORVASC) 10 MG tablet Take 1 tablet (10 mg total) by mouth daily. 90 tablet 3  . aspirin EC 81 MG EC tablet Take 1 tablet (81 mg total) by mouth daily.    [name] aspirin-sod bicarb-citric acid (ALKA-SELTZER) 325 MG TBEF tablet Take 325 mg by mouth at bedtime.    [name] atorvastatin (LIPITOR) 20 MG tablet Take 1 tablet (20 mg total) by mouth daily. 90 tablet 3  . glucose blood (ONETOUCH VERIO) test strip Use TID 100 each 11  . Insulin Pen Needle (PEN NEEDLES) 32G X 6 MM MISC 1 pen by Does not apply route at bedtime. Use to administer Levemir insulin daily. Dx E11.9 by Does not apply route. Use to administer Levemir insulin daily. Dx E11.9 90 each 3  . losartan (COZAAR) 50 MG tablet TAKE ONE TABLET BY MOUTH  DAILY 30 tablet 1  . nebivolol (BYSTOLIC) 10 MG tablet Take 1 tablet (10 mg total) by mouth daily. 35 tablet 0  . sitaGLIPtin-metformin (JANUMET) 50-1000 MG tablet Take 1 tablet by mouth 2 (two) times daily with a meal. 60 tablet 3  . Azilsartan-Chlorthalidone (EDARBYCLOR) 40-25 MG TABS Take 1 tablet by mouth daily. 35 tablet 0  . Insulin Detemir (LEVEMIR FLEXTOUCH) 100 UNIT/ML Pen Inject 75 Units into the skin daily at 10 pm. 15 mL 11   No facility-administered medications prior to  visit.     ROS Review of Systems  Constitutional: Negative for activity change, diaphoresis, fatigue and unexpected weight change.  HENT: Negative.   Eyes: Negative for visual disturbance.  Respiratory: Negative for cough, choking, chest tightness, shortness of breath and stridor.   Cardiovascular: Negative.  Negative for chest pain, palpitations and leg swelling.  Gastrointestinal: Negative.  Negative for abdominal pain, constipation, diarrhea, nausea and vomiting.  Endocrine: Negative for polydipsia, polyphagia and polyuria.  Genitourinary: Negative.  Negative for difficulty urinating.  Musculoskeletal: Negative.   Skin: Negative.   Neurological: Positive for syncope and light-headedness. Negative for dizziness, weakness and numbness.  Hematological: Negative.   Psychiatric/Behavioral: Negative.     Objective:  BP 120/78   Pulse (!) 55   Temp 98 F (36.7 C)   Resp 16   Wt 278 lb (126.1 kg)   LMP 02/23/2012   SpO2 98%   BMI 52.53 kg/m   BP Readings from Last 3 Encounters:  06/01/16 120/78  04/28/16 (!) 150/120  06/12/15 (!) 192/115    Wt Readings from Last 3 Encounters:  06/01/16 278 lb (126.1 kg)  04/28/16 265 lb 4 oz (120.3 kg)  06/12/15 258 lb (117 kg)    Physical Exam  Constitutional: She is oriented to person, place, and time. No distress.  HENT:  Mouth/Throat: Oropharynx is clear and moist. No oropharyngeal exudate.  Eyes: Conjunctivae are normal. Right eye  exhibits no discharge. Left eye exhibits no discharge. No scleral icterus.  Neck: Normal range of motion. Neck supple. No JVD present. No tracheal deviation present. No thyromegaly present.  Cardiovascular: Normal rate, regular rhythm, normal heart sounds and intact distal pulses.  Exam reveals no gallop and no friction rub.   No murmur heard. Pulmonary/Chest: Effort normal and breath sounds normal. No stridor. No respiratory distress. She has no wheezes. She has no rales. She exhibits no tenderness.    Abdominal: Soft. Bowel sounds are normal. She exhibits no distension and no mass. There is no tenderness. There is no rebound and no guarding.  Musculoskeletal: Normal range of motion. She exhibits no edema, tenderness or deformity.  Lymphadenopathy:    She has no cervical adenopathy.  Neurological: She is oriented to person, place, and time.  Skin: Skin is warm and dry. No rash noted. She is not diaphoretic. No erythema. No pallor.  Vitals reviewed.   Lab Results  Component Value Date   WBC 6.6 04/28/2016   HGB 12.7 04/28/2016   HCT 39.2 04/28/2016   PLT 220.0 04/28/2016   GLUCOSE 376 (H) 04/28/2016   CHOL 175 04/28/2016   TRIG 121.0 04/28/2016   HDL 53.00 04/28/2016   LDLCALC 98 04/28/2016   ALT 11 04/28/2016   AST 11 04/28/2016   NA 137 04/28/2016   K 4.1 04/28/2016   CL 100 04/28/2016   CREATININE 1.31 (H) 04/28/2016   BUN 20 04/28/2016   CO2 29 04/28/2016   TSH 3.27 04/28/2016   INR 0.96 03/30/2014   HGBA1C 12.8 (H) 04/28/2016   MICROALBUR 150.6 (H) 04/28/2016    Dg Chest 2 View  Result Date: 06/12/2015 CLINICAL DATA:  Hyperglycemia with glucose = 604, generalized chest discomfort and nausea starting today, elevated blood pressure, history asthma, CHF, type II diabetes mellitus, hypertension, former smoker EXAM: CHEST  2 VIEW COMPARISON:  03/31/2014 FINDINGS: Enlargement of cardiac silhouette. Mediastinal contours and pulmonary vascularity normal. With minimal chronic central peribronchial thickening. No pulmonary infiltrate, pleural effusion or pneumothorax. Bones unremarkable. IMPRESSION: Enlargement of cardiac silhouette. No acute abnormalities. Electronically Signed   By: Ulyses SouthwardMark  Boles M.D.   On: 06/12/2015 17:25    Assessment & Plan:   Lori Mclaughlin was seen today for hypertension and diabetes.  Diagnoses and all orders for this visit:  Uncontrolled type 2 diabetes mellitus with microalbuminuria, unspecified long term insulin use status (HCC)- she reports symptomatic  hypoglycemia in the morning so I've advised her to decrease her Levemir dose from 75 units a day to 60 units a day. -     Insulin Detemir (LEVEMIR FLEXTOUCH) 100 UNIT/ML Pen; Inject 60 Units into the skin daily at 10 pm.  Essential hypertension, malignant- her blood pressure is adequately well controlled, will continue the combination of an ARB, beta-blocker, and thiazide diuretic. -     Azilsartan-Chlorthalidone (EDARBYCLOR) 40-25 MG TABS; Take 1 tablet by mouth daily.  Uncontrolled type 2 diabetes mellitus with diabetic nephropathy, with long-term current use of insulin (HCC) -     Azilsartan-Chlorthalidone (EDARBYCLOR) 40-25 MG TABS; Take 1 tablet by mouth daily. -     Insulin Detemir (LEVEMIR FLEXTOUCH) 100 UNIT/ML Pen; Inject 60 Units into the skin daily at 10 pm.   I have changed Lori Mclaughlin's Insulin Detemir. I am also having her maintain her aspirin, acetaminophen, aspirin-sod bicarb-citric acid, atorvastatin, losartan, sitaGLIPtin-metformin, nebivolol, amLODipine, glucose blood, Pen Needles, and Azilsartan-Chlorthalidone.  Meds ordered this encounter  Medications  . Azilsartan-Chlorthalidone (EDARBYCLOR) 40-25 MG TABS  Sig: Take 1 tablet by mouth daily.    Dispense:  30 tablet    Refill:  11  . Insulin Detemir (LEVEMIR FLEXTOUCH) 100 UNIT/ML Pen    Sig: Inject 60 Units into the skin daily at 10 pm.    Dispense:  15 mL    Refill:  11     Follow-up: No Follow-up on file.  Sanda Lingerhomas Lessa Huge, MD

## 2016-06-02 ENCOUNTER — Encounter: Payer: Self-pay | Admitting: Internal Medicine

## 2016-06-22 ENCOUNTER — Telehealth: Payer: Self-pay | Admitting: Emergency Medicine

## 2016-06-22 NOTE — Telephone Encounter (Signed)
error 

## 2016-06-29 ENCOUNTER — Institutional Professional Consult (permissible substitution): Payer: Self-pay | Admitting: Pulmonary Disease

## 2016-08-03 ENCOUNTER — Ambulatory Visit: Payer: Self-pay | Admitting: Dietician

## 2016-08-10 DIAGNOSIS — H35031 Hypertensive retinopathy, right eye: Secondary | ICD-10-CM | POA: Diagnosis not present

## 2016-08-10 DIAGNOSIS — E113513 Type 2 diabetes mellitus with proliferative diabetic retinopathy with macular edema, bilateral: Secondary | ICD-10-CM | POA: Diagnosis not present

## 2016-08-10 LAB — HM DIABETES EYE EXAM

## 2016-09-01 ENCOUNTER — Institutional Professional Consult (permissible substitution): Payer: Self-pay | Admitting: Pulmonary Disease

## 2016-09-02 ENCOUNTER — Telehealth: Payer: Self-pay

## 2016-09-02 ENCOUNTER — Ambulatory Visit (INDEPENDENT_AMBULATORY_CARE_PROVIDER_SITE_OTHER): Payer: 59 | Admitting: Internal Medicine

## 2016-09-02 ENCOUNTER — Encounter: Payer: Self-pay | Admitting: Internal Medicine

## 2016-09-02 VITALS — BP 190/110 | HR 63 | Temp 97.9°F | Resp 16 | Ht 61.0 in | Wt 284.2 lb

## 2016-09-02 DIAGNOSIS — E1165 Type 2 diabetes mellitus with hyperglycemia: Secondary | ICD-10-CM

## 2016-09-02 DIAGNOSIS — E785 Hyperlipidemia, unspecified: Secondary | ICD-10-CM

## 2016-09-02 DIAGNOSIS — IMO0002 Reserved for concepts with insufficient information to code with codable children: Secondary | ICD-10-CM

## 2016-09-02 DIAGNOSIS — E1129 Type 2 diabetes mellitus with other diabetic kidney complication: Secondary | ICD-10-CM | POA: Diagnosis not present

## 2016-09-02 DIAGNOSIS — I10 Essential (primary) hypertension: Secondary | ICD-10-CM | POA: Diagnosis not present

## 2016-09-02 DIAGNOSIS — Z794 Long term (current) use of insulin: Secondary | ICD-10-CM

## 2016-09-02 DIAGNOSIS — R809 Proteinuria, unspecified: Secondary | ICD-10-CM

## 2016-09-02 DIAGNOSIS — E1121 Type 2 diabetes mellitus with diabetic nephropathy: Secondary | ICD-10-CM | POA: Diagnosis not present

## 2016-09-02 LAB — POCT GLYCOSYLATED HEMOGLOBIN (HGB A1C): Hemoglobin A1C: 10.4

## 2016-09-02 MED ORDER — TELMISARTAN 80 MG PO TABS: 80.0000 mg | ORAL_TABLET | Freq: Every day | ORAL | 1 refills | Status: DC

## 2016-09-02 MED ORDER — NEBIVOLOL HCL 10 MG PO TABS: 10.0000 mg | ORAL_TABLET | Freq: Every day | ORAL | 1 refills | Status: DC

## 2016-09-02 MED ORDER — SITAGLIPTIN PHOS-METFORMIN HCL 50-1000 MG PO TABS: 1.0000 | ORAL_TABLET | Freq: Two times a day (BID) | ORAL | 1 refills | Status: DC

## 2016-09-02 MED ORDER — ATORVASTATIN CALCIUM 20 MG PO TABS: 20.0000 mg | ORAL_TABLET | Freq: Every day | ORAL | 3 refills | Status: DC

## 2016-09-02 MED ORDER — CHLORTHALIDONE 25 MG PO TABS: 25.0000 mg | ORAL_TABLET | Freq: Every day | ORAL | 1 refills | Status: DC

## 2016-09-02 MED ORDER — DAPAGLIFLOZIN PROPANEDIOL 5 MG PO TABS: 5.0000 mg | ORAL_TABLET | Freq: Every day | ORAL | 1 refills | Status: DC

## 2016-09-02 MED ORDER — INSULIN DETEMIR 100 UNIT/ML FLEXPEN: 60.0000 [IU] | PEN_INJECTOR | Freq: Every day | SUBCUTANEOUS | 3 refills | Status: DC

## 2016-09-02 NOTE — Progress Notes (Signed)
Subjective:  Patient ID: Lori Mclaughlin, female    DOB: 1961-01-06  Age: 56 y.o. MRN: [id number]  CC: Hypertension and Diabetes   HPI Lori Mclaughlin presents for Follow-up on hypertension and diabetes. She has not been compliant with her medications because she ran out, couldn't afford, or her insurance wouldn't pay for several items. Her blood pressure has not been well controlled and she has had a few headaches but she denies chest pain, shortness of breath, or edema.  She also tells me her blood sugars have not been well controlled and she complains of polyuria but she denies visual disturbance.  Outpatient Medications Prior to Visit  Medication Sig Dispense Refill  . amLODipine (NORVASC) 10 MG tablet Take 1 tablet (10 mg total) by mouth daily. 90 tablet 3  . aspirin EC 81 MG EC tablet Take 1 tablet (81 mg total) by mouth daily.    [name]. glucose blood (ONETOUCH VERIO) test strip Use TID 100 each 11  . Insulin Pen Needle (PEN NEEDLES) 32G X 6 MM MISC 1 pen by Does not apply route at bedtime. Use to administer Levemir insulin daily. Dx E11.9 by Does not apply route. Use to administer Levemir insulin daily. Dx E11.9 90 each 3  . acetaminophen (TYLENOL) 500 MG tablet Take 1,000 mg by mouth every 6 (six) hours as needed for mild pain, moderate pain or headache.    [name]. aspirin-sod bicarb-citric acid (ALKA-SELTZER) 325 MG TBEF tablet Take 325 mg by mouth at bedtime.    [name]. atorvastatin (LIPITOR) 20 MG tablet Take 1 tablet (20 mg total) by mouth daily. 90 tablet 3  . Insulin Detemir (LEVEMIR FLEXTOUCH) 100 UNIT/ML Pen Inject 60 Units into the skin daily at 10 pm. 15 mL 11  . losartan (COZAAR) 50 MG tablet TAKE ONE TABLET BY MOUTH  DAILY 30 tablet 1  . Azilsartan-Chlorthalidone (EDARBYCLOR) 40-25 MG TABS Take 1 tablet by mouth daily. (Patient not taking: Reported on 09/02/2016) 30 tablet 11  . nebivolol (BYSTOLIC) 10 MG tablet Take 1 tablet (10 mg total) by mouth daily. (Patient not taking:  Reported on 09/02/2016) 35 tablet 0  . sitaGLIPtin-metformin (JANUMET) 50-1000 MG tablet Take 1 tablet by mouth 2 (two) times daily with a meal. (Patient not taking: Reported on 09/02/2016) 60 tablet 3   No facility-administered medications prior to visit.     ROS Review of Systems  Constitutional: Negative.  Negative for appetite change, diaphoresis, fatigue and unexpected weight change.  HENT: Negative.  Negative for trouble swallowing.   Eyes: Negative.  Negative for visual disturbance.  Respiratory: Negative.  Negative for cough, chest tightness, shortness of breath and wheezing.   Cardiovascular: Negative.  Negative for chest pain, palpitations and leg swelling.  Gastrointestinal: Negative for abdominal pain, constipation, diarrhea, nausea and vomiting.  Endocrine: Positive for polyuria. Negative for polydipsia and polyphagia.  Genitourinary: Negative.  Negative for difficulty urinating, dysuria and hematuria.  Musculoskeletal: Negative.  Negative for back pain, myalgias and neck pain.  Skin: Negative.  Negative for color change and rash.  Allergic/Immunologic: Negative.   Neurological: Positive for headaches. Negative for dizziness, weakness, light-headedness and numbness.  Hematological: Negative for adenopathy. Does not bruise/bleed easily.  Psychiatric/Behavioral: Negative.     Objective:  BP (!) 190/110 (BP Location: Left Arm, Patient Position: Sitting, Cuff Size: Large)   Pulse 63   Temp 97.9 F (36.6 C) (Oral)   Resp 16   Ht 5\' 1"  (1.549 m)   Wt 284 lb 4 oz (  128.9 kg)   LMP 02/23/2012   SpO2 98%   BMI 53.71 kg/m   BP Readings from Last 3 Encounters:  09/02/16 (!) 190/110  06/01/16 120/78  04/28/16 (!) 150/120    Wt Readings from Last 3 Encounters:  09/02/16 284 lb 4 oz (128.9 kg)  06/01/16 278 lb (126.1 kg)  04/28/16 265 lb 4 oz (120.3 kg)    Physical Exam  Constitutional: She is oriented to person, place, and time. No distress.  HENT:  Mouth/Throat:  Oropharynx is clear and moist. No oropharyngeal exudate.  Eyes: Conjunctivae are normal. Right eye exhibits no discharge. Left eye exhibits no discharge. No scleral icterus.  Neck: Normal range of motion. Neck supple. No JVD present. No tracheal deviation present. No thyromegaly present.  Cardiovascular: Normal rate, regular rhythm, normal heart sounds and intact distal pulses.  Exam reveals no gallop and no friction rub.   No murmur heard. Pulmonary/Chest: Effort normal and breath sounds normal. No stridor. No respiratory distress. She has no wheezes. She has no rales. She exhibits no tenderness.  Abdominal: Soft. Bowel sounds are normal. She exhibits no distension and no mass. There is no tenderness. There is no rebound and no guarding.  Musculoskeletal: Normal range of motion. She exhibits no edema, tenderness or deformity.  Lymphadenopathy:    She has no cervical adenopathy.  Neurological: She is oriented to person, place, and time.  Skin: Skin is warm and dry. No rash noted. She is not diaphoretic. No erythema. No pallor.  Vitals reviewed.   Lab Results  Component Value Date   WBC 6.6 04/28/2016   HGB 12.7 04/28/2016   HCT 39.2 04/28/2016   PLT 220.0 04/28/2016   GLUCOSE 376 (H) 04/28/2016   CHOL 175 04/28/2016   TRIG 121.0 04/28/2016   HDL 53.00 04/28/2016   LDLCALC 98 04/28/2016   ALT 11 04/28/2016   AST 11 04/28/2016   NA 137 04/28/2016   K 4.1 04/28/2016   CL 100 04/28/2016   CREATININE 1.31 (H) 04/28/2016   BUN 20 04/28/2016   CO2 29 04/28/2016   TSH 3.27 04/28/2016   INR 0.96 03/30/2014   HGBA1C 10.4 09/02/2016   MICROALBUR 150.6 (H) 04/28/2016    Dg Chest 2 View  Result Date: 06/12/2015 CLINICAL DATA:  Hyperglycemia with glucose = 604, generalized chest discomfort and nausea starting today, elevated blood pressure, history asthma, CHF, type II diabetes mellitus, hypertension, former smoker EXAM: CHEST  2 VIEW COMPARISON:  03/31/2014 FINDINGS: Enlargement of  cardiac silhouette. Mediastinal contours and pulmonary vascularity normal. With minimal chronic central peribronchial thickening. No pulmonary infiltrate, pleural effusion or pneumothorax. Bones unremarkable. IMPRESSION: Enlargement of cardiac silhouette. No acute abnormalities. Electronically Signed   By: Ulyses Southward M.D.   On: 06/12/2015 17:25    Assessment & Plan:   Tanairi was seen today for hypertension and diabetes.  Diagnoses and all orders for this visit:  Essential hypertension, malignant- her blood pressure is not well controlled, will change to a generic ARB and thiazide diuretic. Will continue nebivolol. -     nebivolol (BYSTOLIC) 10 MG tablet; Take 1 tablet (10 mg total) by mouth daily. -     chlorthalidone (HYGROTON) 25 MG tablet; Take 1 tablet (25 mg total) by mouth daily. -     telmisartan (MICARDIS) 80 MG tablet; Take 1 tablet (80 mg total) by mouth daily. -     Basic metabolic panel; Future  Uncontrolled type 2 diabetes mellitus with diabetic nephropathy, with long-term current use of  insulin (HCC)- her blood sugars are not well controlled with an A1c of 10.4%. Will restart Levemir, the DPP4 inhibitor, and metformin. Will also add an SGLT2 inhibitor. -     sitaGLIPtin-metformin (JANUMET) 50-1000 MG tablet; Take 1 tablet by mouth 2 (two) times daily with a meal. -     Insulin Detemir (LEVEMIR FLEXTOUCH) 100 UNIT/ML Pen; Inject 60 Units into the skin daily at 10 pm. -     telmisartan (MICARDIS) 80 MG tablet; Take 1 tablet (80 mg total) by mouth daily. -     Basic metabolic panel; Future -     dapagliflozin propanediol (FARXIGA) 5 MG TABS tablet; Take 5 mg by mouth daily.  Uncontrolled type 2 diabetes mellitus with microalbuminuria, unspecified long term insulin use status (HCC) -     Insulin Detemir (LEVEMIR FLEXTOUCH) 100 UNIT/ML Pen; Inject 60 Units into the skin daily at 10 pm. -     POCT glycosylated hemoglobin (Hb A1C)  Uncontrolled type 2 diabetes mellitus with diabetic  nephropathy, without long-term current use of insulin (HCC) -     atorvastatin (LIPITOR) 20 MG tablet; Take 1 tablet (20 mg total) by mouth daily.  Hyperlipidemia with target LDL less than 70- based on her last LDL she had not achieved her LDL goal of 70, will restart the statin. -     atorvastatin (LIPITOR) 20 MG tablet; Take 1 tablet (20 mg total) by mouth daily.   I have discontinued Ms. Simeon Craft acetaminophen, aspirin-sod bicarb-citric acid, losartan, and Azilsartan-Chlorthalidone. I am also having her start on chlorthalidone, telmisartan, and dapagliflozin propanediol. Additionally, I am having her maintain her aspirin, amLODipine, glucose blood, Pen Needles, sitaGLIPtin-metformin, nebivolol, Insulin Detemir, and atorvastatin.  Meds ordered this encounter  Medications  . sitaGLIPtin-metformin (JANUMET) 50-1000 MG tablet    Sig: Take 1 tablet by mouth 2 (two) times daily with a meal.    Dispense:  180 tablet    Refill:  1  . nebivolol (BYSTOLIC) 10 MG tablet    Sig: Take 1 tablet (10 mg total) by mouth daily.    Dispense:  90 tablet    Refill:  1  . Insulin Detemir (LEVEMIR FLEXTOUCH) 100 UNIT/ML Pen    Sig: Inject 60 Units into the skin daily at 10 pm.    Dispense:  45 mL    Refill:  3  . atorvastatin (LIPITOR) 20 MG tablet    Sig: Take 1 tablet (20 mg total) by mouth daily.    Dispense:  90 tablet    Refill:  3  . chlorthalidone (HYGROTON) 25 MG tablet    Sig: Take 1 tablet (25 mg total) by mouth daily.    Dispense:  90 tablet    Refill:  1  . telmisartan (MICARDIS) 80 MG tablet    Sig: Take 1 tablet (80 mg total) by mouth daily.    Dispense:  90 tablet    Refill:  1  . dapagliflozin propanediol (FARXIGA) 5 MG TABS tablet    Sig: Take 5 mg by mouth daily.    Dispense:  90 tablet    Refill:  1     Follow-up: Return in about 3 weeks (around 09/23/2016).  Sanda Linger, MD

## 2016-09-02 NOTE — Telephone Encounter (Signed)
PA started via faxed form.

## 2016-09-02 NOTE — Patient Instructions (Signed)
Hypertension Hypertension, commonly called high blood pressure, is when the force of blood pumping through your arteries is too strong. Your arteries are the blood vessels that carry blood from your heart throughout your body. A blood pressure reading consists of a higher number over a lower number, such as 110/72. The higher number (systolic) is the pressure inside your arteries when your heart pumps. The lower number (diastolic) is the pressure inside your arteries when your heart relaxes. Ideally you want your blood pressure below 120/80. Hypertension forces your heart to work harder to pump blood. Your arteries may become narrow or stiff. Having untreated or uncontrolled hypertension can cause heart attack, stroke, kidney disease, and other problems. What increases the risk? Some risk factors for high blood pressure are controllable. Others are not. Risk factors you cannot control include:  Race. You may be at higher risk if you are African American.  Age. Risk increases with age.  Gender. Men are at higher risk than women before age 45 years. After age 65, women are at higher risk than men. Risk factors you can control include:  Not getting enough exercise or physical activity.  Being overweight.  Getting too much fat, sugar, calories, or salt in your diet.  Drinking too much alcohol. What are the signs or symptoms? Hypertension does not usually cause signs or symptoms. Extremely high blood pressure (hypertensive crisis) may cause headache, anxiety, shortness of breath, and nosebleed. How is this diagnosed? To check if you have hypertension, your health care provider will measure your blood pressure while you are seated, with your arm held at the level of your heart. It should be measured at least twice using the same arm. Certain conditions can cause a difference in blood pressure between your right and left arms. A blood pressure reading that is higher than normal on one occasion does  not mean that you need treatment. If it is not clear whether you have high blood pressure, you may be asked to return on a different day to have your blood pressure checked again. Or, you may be asked to monitor your blood pressure at home for 1 or more weeks. How is this treated? Treating high blood pressure includes making lifestyle changes and possibly taking medicine. Living a healthy lifestyle can help lower high blood pressure. You may need to change some of your habits. Lifestyle changes may include:  Following the DASH diet. This diet is high in fruits, vegetables, and whole grains. It is low in salt, red meat, and added sugars.  Keep your sodium intake below 2,300 mg per day.  Getting at least 30-45 minutes of aerobic exercise at least 4 times per week.  Losing weight if necessary.  Not smoking.  Limiting alcoholic beverages.  Learning ways to reduce stress. Your health care provider may prescribe medicine if lifestyle changes are not enough to get your blood pressure under control, and if one of the following is true:  You are 18-59 years of age and your systolic blood pressure is above 140.  You are 60 years of age or older, and your systolic blood pressure is above 150.  Your diastolic blood pressure is above 90.  You have diabetes, and your systolic blood pressure is over 140 or your diastolic blood pressure is over 90.  You have kidney disease and your blood pressure is above 140/90.  You have heart disease and your blood pressure is above 140/90. Your personal target blood pressure may vary depending on your medical   conditions, your age, and other factors. Follow these instructions at home:  Have your blood pressure rechecked as directed by your health care provider.  Take medicines only as directed by your health care provider. Follow the directions carefully. Blood pressure medicines must be taken as prescribed. The medicine does not work as well when you skip  doses. Skipping doses also puts you at risk for problems.  Do not smoke.  Monitor your blood pressure at home as directed by your health care provider. Contact a health care provider if:  You think you are having a reaction to medicines taken.  You have recurrent headaches or feel dizzy.  You have swelling in your ankles.  You have trouble with your vision. Get help right away if:  You develop a severe headache or confusion.  You have unusual weakness, numbness, or feel faint.  You have severe chest or abdominal pain.  You vomit repeatedly.  You have trouble breathing. This information is not intended to replace advice given to you by your health care provider. Make sure you discuss any questions you have with your health care provider. Document Released: 07/13/2005 Document Revised: 12/19/2015 Document Reviewed: 05/05/2013 Elsevier Interactive Patient Education  2017 Elsevier Inc.  

## 2016-09-02 NOTE — Progress Notes (Signed)
Pre visit review using our clinic review tool, if applicable. No additional management support is needed unless otherwise documented below in the visit note.  Patient stated pharmacy would not fill medication without a 90 day supply.

## 2016-09-16 NOTE — Telephone Encounter (Signed)
Re faxing PA form.  

## 2016-09-21 DIAGNOSIS — E113513 Type 2 diabetes mellitus with proliferative diabetic retinopathy with macular edema, bilateral: Secondary | ICD-10-CM | POA: Diagnosis not present

## 2016-09-21 DIAGNOSIS — H3582 Retinal ischemia: Secondary | ICD-10-CM | POA: Diagnosis not present

## 2016-09-21 DIAGNOSIS — H35031 Hypertensive retinopathy, right eye: Secondary | ICD-10-CM | POA: Diagnosis not present

## 2016-11-17 ENCOUNTER — Other Ambulatory Visit: Payer: Self-pay | Admitting: Internal Medicine

## 2016-11-17 DIAGNOSIS — Z1231 Encounter for screening mammogram for malignant neoplasm of breast: Secondary | ICD-10-CM

## 2016-12-11 ENCOUNTER — Ambulatory Visit: Payer: 59

## 2016-12-25 ENCOUNTER — Ambulatory Visit: Payer: 59

## 2016-12-28 DIAGNOSIS — H3582 Retinal ischemia: Secondary | ICD-10-CM | POA: Diagnosis not present

## 2016-12-28 DIAGNOSIS — H35033 Hypertensive retinopathy, bilateral: Secondary | ICD-10-CM | POA: Diagnosis not present

## 2016-12-28 DIAGNOSIS — E113513 Type 2 diabetes mellitus with proliferative diabetic retinopathy with macular edema, bilateral: Secondary | ICD-10-CM | POA: Diagnosis not present

## 2017-01-11 DIAGNOSIS — E113512 Type 2 diabetes mellitus with proliferative diabetic retinopathy with macular edema, left eye: Secondary | ICD-10-CM | POA: Diagnosis not present

## 2017-01-14 ENCOUNTER — Ambulatory Visit: Payer: 59

## 2017-01-14 DIAGNOSIS — E113512 Type 2 diabetes mellitus with proliferative diabetic retinopathy with macular edema, left eye: Secondary | ICD-10-CM | POA: Diagnosis not present

## 2017-03-09 ENCOUNTER — Ambulatory Visit: Payer: 59 | Admitting: Internal Medicine

## 2017-03-10 ENCOUNTER — Encounter: Payer: Self-pay | Admitting: Internal Medicine

## 2017-03-10 ENCOUNTER — Other Ambulatory Visit (INDEPENDENT_AMBULATORY_CARE_PROVIDER_SITE_OTHER): Payer: 59

## 2017-03-10 ENCOUNTER — Ambulatory Visit (INDEPENDENT_AMBULATORY_CARE_PROVIDER_SITE_OTHER): Payer: 59 | Admitting: Internal Medicine

## 2017-03-10 VITALS — BP 164/100 | HR 69 | Temp 98.6°F | Resp 16 | Ht 61.0 in | Wt 276.5 lb

## 2017-03-10 DIAGNOSIS — I10 Essential (primary) hypertension: Secondary | ICD-10-CM | POA: Diagnosis not present

## 2017-03-10 DIAGNOSIS — E785 Hyperlipidemia, unspecified: Secondary | ICD-10-CM

## 2017-03-10 DIAGNOSIS — G4730 Sleep apnea, unspecified: Secondary | ICD-10-CM

## 2017-03-10 DIAGNOSIS — Z794 Long term (current) use of insulin: Secondary | ICD-10-CM

## 2017-03-10 DIAGNOSIS — IMO0002 Reserved for concepts with insufficient information to code with codable children: Secondary | ICD-10-CM

## 2017-03-10 DIAGNOSIS — M791 Myalgia, unspecified site: Secondary | ICD-10-CM

## 2017-03-10 DIAGNOSIS — E1121 Type 2 diabetes mellitus with diabetic nephropathy: Secondary | ICD-10-CM

## 2017-03-10 DIAGNOSIS — E1165 Type 2 diabetes mellitus with hyperglycemia: Secondary | ICD-10-CM

## 2017-03-10 LAB — URINALYSIS, ROUTINE W REFLEX MICROSCOPIC
BILIRUBIN URINE: NEGATIVE
HGB URINE DIPSTICK: NEGATIVE
KETONES UR: NEGATIVE
NITRITE: NEGATIVE
Specific Gravity, Urine: 1.015 (ref 1.000–1.030)
TOTAL PROTEIN, URINE-UPE24: 30 — AB
UROBILINOGEN UA: 0.2 (ref 0.0–1.0)
Urine Glucose: NEGATIVE
pH: 6.5 (ref 5.0–8.0)

## 2017-03-10 LAB — LIPID PANEL
CHOLESTEROL: 152 mg/dL (ref 0–200)
HDL: 44.6 mg/dL (ref >39.00)
LDL Cholesterol: 83 mg/dL (ref 0–99)
NonHDL: 107.06
TRIGLYCERIDES: 120 mg/dL (ref 0.0–149.0)
Total CHOL/HDL Ratio: 3
VLDL: 24 mg/dL (ref 0.0–40.0)

## 2017-03-10 LAB — POCT GLUCOSE (DEVICE FOR HOME USE): Glucose Fasting, POC: 271 mg/dL — AB (ref 70–99)

## 2017-03-10 LAB — COMPREHENSIVE METABOLIC PANEL
ALBUMIN: 3.7 g/dL (ref 3.5–5.2)
ALK PHOS: 113 U/L (ref 39–117)
ALT: 16 U/L (ref 0–35)
AST: 17 U/L (ref 0–37)
BILIRUBIN TOTAL: 0.4 mg/dL (ref 0.2–1.2)
BUN: 22 mg/dL (ref 6–23)
CALCIUM: 9.2 mg/dL (ref 8.4–10.5)
CO2: 29 mEq/L (ref 19–32)
Chloride: 100 mEq/L (ref 96–112)
Creatinine, Ser: 1.37 mg/dL — ABNORMAL HIGH (ref 0.40–1.20)
GFR: 42.1 mL/min — AB (ref >60.00)
Glucose, Bld: 265 mg/dL — ABNORMAL HIGH (ref 70–99)
Potassium: 4 mEq/L (ref 3.5–5.1)
Sodium: 135 mEq/L (ref 135–145)
TOTAL PROTEIN: 7.7 g/dL (ref 6.0–8.3)

## 2017-03-10 LAB — CBC WITH DIFFERENTIAL/PLATELET
BASOS ABS: 0.1 10*3/uL (ref 0.0–0.1)
BASOS PCT: 1 % (ref 0.0–3.0)
EOS PCT: 5.8 % — AB (ref 0.0–5.0)
Eosinophils Absolute: 0.4 10*3/uL (ref 0.0–0.7)
HEMATOCRIT: 35.3 % — AB (ref 36.0–46.0)
HEMOGLOBIN: 11.1 g/dL — AB (ref 12.0–15.0)
Lymphocytes Relative: 22.9 % (ref 12.0–46.0)
Lymphs Abs: 1.5 10*3/uL (ref 0.7–4.0)
MCHC: 31.5 g/dL (ref 30.0–36.0)
MCV: 76.9 fl — AB (ref 78.0–100.0)
Monocytes Absolute: 0.5 10*3/uL (ref 0.1–1.0)
Monocytes Relative: 8.3 % (ref 3.0–12.0)
NEUTROS ABS: 4 10*3/uL (ref 1.4–7.7)
Neutrophils Relative %: 62 % (ref 43.0–77.0)
Platelets: 200 10*3/uL (ref 150.0–400.0)
RBC: 4.59 Mil/uL (ref 3.87–5.11)
RDW: 16.8 % — ABNORMAL HIGH (ref 11.5–15.5)
WBC: 6.4 10*3/uL (ref 4.0–10.5)

## 2017-03-10 LAB — MICROALBUMIN / CREATININE URINE RATIO
Creatinine,U: 126.3 mg/dL
MICROALB UR: 17.2 mg/dL — AB (ref 0.0–1.9)
Microalb Creat Ratio: 13.6 mg/g (ref 0.0–30.0)

## 2017-03-10 LAB — CK: Total CK: 88 U/L (ref 7–177)

## 2017-03-10 LAB — POCT GLYCOSYLATED HEMOGLOBIN (HGB A1C): HEMOGLOBIN A1C: 12.1

## 2017-03-10 LAB — SEDIMENTATION RATE: SED RATE: 60 mm/h — AB (ref 0–30)

## 2017-03-10 MED ORDER — AMLODIPINE BESYLATE 5 MG PO TABS: 5.0000 mg | ORAL_TABLET | Freq: Every day | ORAL | 1 refills | Status: DC

## 2017-03-10 MED ORDER — INSULIN DETEMIR 100 UNIT/ML FLEXPEN: 70.0000 [IU] | PEN_INJECTOR | Freq: Every day | SUBCUTANEOUS | 3 refills | Status: DC

## 2017-03-10 MED ORDER — CLONIDINE HCL 0.1 MG/24HR TD PTWK: 0.1000 mg | MEDICATED_PATCH | TRANSDERMAL | 1 refills | Status: DC

## 2017-03-10 MED ORDER — DAPAGLIFLOZIN PROPANEDIOL 10 MG PO TABS: 10.0000 mg | ORAL_TABLET | Freq: Every day | ORAL | 1 refills | Status: DC

## 2017-03-10 NOTE — Progress Notes (Signed)
Subjective:  Patient ID: Lori Mclaughlin, female    DOB: Aug 24, 1960  Age: 56 y.o. MRN: [id number]  CC: Hypertension; Diabetes; and Hyperlipidemia   HPI Lori Mclaughlin presents for f/up - She complains of a several week history of muscle aches, joint aches, and leg cramps. She's gotten symptom relief with Motrin. She is also concerned that her blood pressure and blood sugar have not been well controlled. She has not been taking amlodipine.  Outpatient Medications Prior to Visit  Medication Sig Dispense Refill  . aspirin EC 81 MG EC tablet Take 1 tablet (81 mg total) by mouth daily.    . chlorthalidone (HYGROTON) 25 MG tablet Take 1 tablet (25 mg total) by mouth daily. 90 tablet 1  . glucose blood (ONETOUCH VERIO) test strip Use TID 100 each 11  . Insulin Pen Needle (PEN NEEDLES) 32G X 6 MM MISC 1 pen by Does not apply route at bedtime. Use to administer Levemir insulin daily. Dx E11.9 by Does not apply route. Use to administer Levemir insulin daily. Dx E11.9 90 each 3  . nebivolol (BYSTOLIC) 10 MG tablet Take 1 tablet (10 mg total) by mouth daily. 90 tablet 1  . sitaGLIPtin-metformin (JANUMET) 50-1000 MG tablet Take 1 tablet by mouth 2 (two) times daily with a meal. 180 tablet 1  . telmisartan (MICARDIS) 80 MG tablet Take 1 tablet (80 mg total) by mouth daily. 90 tablet 1  . amLODipine (NORVASC) 10 MG tablet Take 1 tablet (10 mg total) by mouth daily. 90 tablet 3  . atorvastatin (LIPITOR) 20 MG tablet Take 1 tablet (20 mg total) by mouth daily. 90 tablet 3  . dapagliflozin propanediol (FARXIGA) 5 MG TABS tablet Take 5 mg by mouth daily. 90 tablet 1  . Insulin Detemir (LEVEMIR FLEXTOUCH) 100 UNIT/ML Pen Inject 60 Units into the skin daily at 10 pm. 45 mL 3   No facility-administered medications prior to visit.     ROS Review of Systems  Constitutional: Negative for appetite change, chills, diaphoresis, fatigue and fever.  HENT: Negative.   Eyes: Negative for visual  disturbance.  Respiratory: Positive for apnea. Negative for cough, chest tightness, shortness of breath and wheezing.   Cardiovascular: Negative for chest pain, palpitations and leg swelling.  Gastrointestinal: Negative for abdominal pain, constipation, diarrhea, nausea and vomiting.  Endocrine: Negative.  Negative for polydipsia, polyphagia and polyuria.  Genitourinary: Negative.  Negative for decreased urine volume, difficulty urinating, dysuria, frequency and urgency.  Musculoskeletal: Positive for arthralgias and myalgias. Negative for back pain, gait problem and neck pain.  Skin: Negative.   Allergic/Immunologic: Negative.   Neurological: Negative.  Negative for dizziness, weakness, numbness and headaches.  Hematological: Negative for adenopathy. Does not bruise/bleed easily.  Psychiatric/Behavioral: Negative.     Objective:  BP (!) 164/100 (BP Location: Left Arm, Patient Position: Sitting, Cuff Size: Large)   Pulse 69   Temp 98.6 F (37 C) (Oral)   Resp 16   Ht 5\' 1"  (1.549 m)   Wt 276 lb 8 oz (125.4 kg)   LMP 02/23/2012   SpO2 99%   BMI 52.24 kg/m   BP Readings from Last 3 Encounters:  03/10/17 (!) 164/100  09/02/16 (!) 190/110  06/01/16 120/78    Wt Readings from Last 3 Encounters:  03/10/17 276 lb 8 oz (125.4 kg)  09/02/16 284 lb 4 oz (128.9 kg)  06/01/16 278 lb (126.1 kg)    Physical Exam  Constitutional: She is oriented to person, place, and time.  No distress.  HENT:  Mouth/Throat: Oropharynx is clear and moist. No oropharyngeal exudate.  Eyes: Conjunctivae are normal. Right eye exhibits no discharge. Left eye exhibits no discharge. No scleral icterus.  Neck: Normal range of motion. Neck supple. No JVD present. No tracheal deviation present. No thyromegaly present.  Cardiovascular: Normal rate, regular rhythm and intact distal pulses.  Exam reveals no gallop and no friction rub.   No murmur heard. Pulmonary/Chest: Effort normal and breath sounds normal. No  stridor. No respiratory distress. She has no wheezes. She has no rales. She exhibits no tenderness.  Abdominal: Soft. Bowel sounds are normal. She exhibits no distension and no mass. There is no tenderness. There is no rebound and no guarding.  Musculoskeletal: Normal range of motion. She exhibits no edema, tenderness or deformity.  Lymphadenopathy:    She has no cervical adenopathy.  Neurological: She is alert and oriented to person, place, and time. She has normal reflexes. She displays normal reflexes. No cranial nerve deficit. She exhibits normal muscle tone. Coordination normal.  Skin: Skin is warm and dry. No rash noted. She is not diaphoretic. No erythema. No pallor.  Vitals reviewed.   Lab Results  Component Value Date   WBC 6.4 03/10/2017   HGB 11.1 (L) 03/10/2017   HCT 35.3 (L) 03/10/2017   PLT 200.0 03/10/2017   GLUCOSE 265 (H) 03/10/2017   CHOL 152 03/10/2017   TRIG 120.0 03/10/2017   HDL 44.60 03/10/2017   LDLCALC 83 03/10/2017   ALT 16 03/10/2017   AST 17 03/10/2017   NA 135 03/10/2017   K 4.0 03/10/2017   CL 100 03/10/2017   CREATININE 1.37 (H) 03/10/2017   BUN 22 03/10/2017   CO2 29 03/10/2017   TSH 2.06 03/10/2017   INR 0.96 03/30/2014   HGBA1C 12.1 03/10/2017   MICROALBUR 17.2 (H) 03/10/2017    Dg Chest 2 View  Result Date: 06/12/2015 CLINICAL DATA:  Hyperglycemia with glucose = 604, generalized chest discomfort and nausea starting today, elevated blood pressure, history asthma, CHF, type II diabetes mellitus, hypertension, former smoker EXAM: CHEST  2 VIEW COMPARISON:  03/31/2014 FINDINGS: Enlargement of cardiac silhouette. Mediastinal contours and pulmonary vascularity normal. With minimal chronic central peribronchial thickening. No pulmonary infiltrate, pleural effusion or pneumothorax. Bones unremarkable. IMPRESSION: Enlargement of cardiac silhouette. No acute abnormalities. Electronically Signed   By: Ulyses Southward M.D.   On: 06/12/2015 17:25     Assessment & Plan:   Lori Mclaughlin was seen today for hypertension, diabetes and hyperlipidemia.  Diagnoses and all orders for this visit:  Uncontrolled type 2 diabetes mellitus with diabetic nephropathy, with long-term current use of insulin (HCC)- her A1c is up to 12.1%. Will increase her dose of basal insulin and will increase the dose of 4 sig. I've also encouraged her to consider bariatric surgery. -     POCT Glucose (Device for Home Use) -     POCT glycosylated hemoglobin (Hb A1C) -     dapagliflozin propanediol (FARXIGA) 10 MG TABS tablet; Take 10 mg by mouth daily. -     Ambulatory referral to Endocrinology -     Comprehensive metabolic panel; Future -     Microalbumin / creatinine urine ratio; Future -     Insulin Detemir (LEVEMIR FLEXTOUCH) 100 UNIT/ML Pen; Inject 70 Units into the skin daily at 10 pm.  Sleep apnea, unspecified type -     Ambulatory referral to Sleep Studies  Morbid obesity (HCC)- she wants to consider aggressive options to help  her lose weight so I've referred her for weight management and to be considered for bariatric surgery. -     Ambulatory referral to General Surgery -     Amb Ref to Medical Weight Management  Essential hypertension, malignant- her blood pressure is not adequately well controlled. Will restart amlodipine but a lower dose to avoid edema. Will also add on a clonidine patch. She will continue the other meds. -     amLODipine (NORVASC) 5 MG tablet; Take 1 tablet (5 mg total) by mouth daily. -     CBC with Differential/Platelet; Future -     Comprehensive metabolic panel; Future -     Urinalysis, Routine w reflex microscopic; Future -     cloNIDine (CATAPRES - DOSED IN MG/24 HR) 0.1 mg/24hr patch; Place 1 patch (0.1 mg total) onto the skin once a week.  Hyperlipidemia with target LDL less than 70- she complains of myalgias so will discontinue the statin. -     Lipid panel; Future -     Thyroid Panel With TSH; Future  Myalgia- her CK level  is normal but her sedimentation rate is mildly elevated. I'm concerned her symptoms may be related to the statin so I've asked her to stop taking it for now. -     CK; Future -     Sedimentation rate; Future   I have discontinued Ms. Simeon Craft amLODipine, atorvastatin, and dapagliflozin propanediol. I have also changed her Insulin Detemir. Additionally, I am having her start on dapagliflozin propanediol, amLODipine, and cloNIDine. Lastly, I am having her maintain her aspirin, glucose blood, Pen Needles, sitaGLIPtin-metformin, nebivolol, chlorthalidone, and telmisartan.  Meds ordered this encounter  Medications  . dapagliflozin propanediol (FARXIGA) 10 MG TABS tablet    Sig: Take 10 mg by mouth daily.    Dispense:  90 tablet    Refill:  1  . amLODipine (NORVASC) 5 MG tablet    Sig: Take 1 tablet (5 mg total) by mouth daily.    Dispense:  90 tablet    Refill:  1  . Insulin Detemir (LEVEMIR FLEXTOUCH) 100 UNIT/ML Pen    Sig: Inject 70 Units into the skin daily at 10 pm.    Dispense:  45 mL    Refill:  3  . cloNIDine (CATAPRES - DOSED IN MG/24 HR) 0.1 mg/24hr patch    Sig: Place 1 patch (0.1 mg total) onto the skin once a week.    Dispense:  12 patch    Refill:  1     Follow-up: Return in about 6 weeks (around 04/21/2017).  Sanda Linger, MD

## 2017-03-10 NOTE — Patient Instructions (Signed)

## 2017-03-11 ENCOUNTER — Encounter: Payer: Self-pay | Admitting: Internal Medicine

## 2017-03-11 LAB — THYROID PANEL WITH TSH
FREE THYROXINE INDEX: 2.9 (ref 1.4–3.8)
T3 UPTAKE: 32 % (ref 22–35)
T4 TOTAL: 9.1 ug/dL (ref 5.1–11.9)
TSH: 2.06 mIU/L

## 2017-03-12 ENCOUNTER — Telehealth: Payer: Self-pay | Admitting: Internal Medicine

## 2017-03-12 NOTE — Telephone Encounter (Signed)
Spoke with patient, this all started on 03/03/17. She is looking for FMLA for her high blood sugars issues and the eye issues with needing the injections. She would like 2- 4 days a months.   She also would like something all the FMLA saying that she can have more time to take breaks to be able to go to the bathroom, when she drinks water like she should.

## 2017-03-12 NOTE — Telephone Encounter (Signed)
We have received FMLA forms for patient. Called and LVM for patient to call back.   RE: Need to know what she is wanting these filled out for? The type she is asking for and the time off and start date?   Can speak with [place] or [place].

## 2017-03-15 DIAGNOSIS — Z0279 Encounter for issue of other medical certificate: Secondary | ICD-10-CM

## 2017-03-15 NOTE — Telephone Encounter (Signed)
Paper work completed and signed by PCP.

## 2017-03-17 NOTE — Telephone Encounter (Signed)
Have you seen this ?

## 2017-03-18 ENCOUNTER — Telehealth: Payer: Self-pay | Admitting: Internal Medicine

## 2017-03-18 NOTE — Telephone Encounter (Signed)
Patient returning phone call about scheduling as a new patient. Call patient to schedule. Patient has lunch 1p-2p daily however, will call back if she misses the next call.

## 2017-03-18 NOTE — Telephone Encounter (Signed)
Forms was in with some plan of cares.   FMLA forms have been faxed 8/23 , sent to scan, original mailed to patient &charged for.

## 2017-03-26 DIAGNOSIS — H35033 Hypertensive retinopathy, bilateral: Secondary | ICD-10-CM | POA: Diagnosis not present

## 2017-03-26 DIAGNOSIS — H3582 Retinal ischemia: Secondary | ICD-10-CM | POA: Diagnosis not present

## 2017-03-26 DIAGNOSIS — E113512 Type 2 diabetes mellitus with proliferative diabetic retinopathy with macular edema, left eye: Secondary | ICD-10-CM | POA: Diagnosis not present

## 2017-03-26 DIAGNOSIS — E113513 Type 2 diabetes mellitus with proliferative diabetic retinopathy with macular edema, bilateral: Secondary | ICD-10-CM | POA: Diagnosis not present

## 2017-04-02 ENCOUNTER — Other Ambulatory Visit: Payer: Self-pay | Admitting: Internal Medicine

## 2017-04-02 DIAGNOSIS — I10 Essential (primary) hypertension: Secondary | ICD-10-CM

## 2017-04-05 DIAGNOSIS — E113511 Type 2 diabetes mellitus with proliferative diabetic retinopathy with macular edema, right eye: Secondary | ICD-10-CM | POA: Diagnosis not present

## 2017-04-09 ENCOUNTER — Telehealth: Payer: Self-pay | Admitting: Internal Medicine

## 2017-04-09 NOTE — Telephone Encounter (Signed)
Received FMLA forms via fax from daughter, Melinda Crutch. She is requesting intermitting FMLA to help her mother during her diabetic episode flare ups. Wanting for MD to approve.

## 2017-04-14 NOTE — Telephone Encounter (Signed)
MD approved the FMLA. All the forms did not come with the fax. Melinda Crutch is going to fax over the forms again.

## 2017-04-28 ENCOUNTER — Ambulatory Visit: Payer: 59 | Admitting: Endocrinology

## 2017-04-28 DIAGNOSIS — Z0289 Encounter for other administrative examinations: Secondary | ICD-10-CM

## 2017-05-06 NOTE — Telephone Encounter (Signed)
Called and LVM to follow up with patient that I have NOT got these forms yet. Please call back and let me know if she still plans to send them.

## 2017-06-10 DIAGNOSIS — E113513 Type 2 diabetes mellitus with proliferative diabetic retinopathy with macular edema, bilateral: Secondary | ICD-10-CM | POA: Diagnosis not present

## 2017-06-10 DIAGNOSIS — H35033 Hypertensive retinopathy, bilateral: Secondary | ICD-10-CM | POA: Diagnosis not present

## 2017-06-10 DIAGNOSIS — H3582 Retinal ischemia: Secondary | ICD-10-CM | POA: Diagnosis not present

## 2017-06-14 DIAGNOSIS — E113511 Type 2 diabetes mellitus with proliferative diabetic retinopathy with macular edema, right eye: Secondary | ICD-10-CM | POA: Diagnosis not present

## 2017-07-08 DIAGNOSIS — H3582 Retinal ischemia: Secondary | ICD-10-CM | POA: Diagnosis not present

## 2017-07-08 DIAGNOSIS — E113513 Type 2 diabetes mellitus with proliferative diabetic retinopathy with macular edema, bilateral: Secondary | ICD-10-CM | POA: Diagnosis not present

## 2017-07-08 DIAGNOSIS — H35033 Hypertensive retinopathy, bilateral: Secondary | ICD-10-CM | POA: Diagnosis not present

## 2017-07-08 LAB — HM DIABETES EYE EXAM

## 2017-07-12 ENCOUNTER — Telehealth: Payer: Self-pay | Admitting: Internal Medicine

## 2017-07-12 ENCOUNTER — Other Ambulatory Visit: Payer: Self-pay | Admitting: Internal Medicine

## 2017-07-12 DIAGNOSIS — Z794 Long term (current) use of insulin: Principal | ICD-10-CM

## 2017-07-12 DIAGNOSIS — E1165 Type 2 diabetes mellitus with hyperglycemia: Secondary | ICD-10-CM

## 2017-07-12 DIAGNOSIS — E1121 Type 2 diabetes mellitus with diabetic nephropathy: Secondary | ICD-10-CM

## 2017-07-12 DIAGNOSIS — IMO0002 Reserved for concepts with insufficient information to code with codable children: Secondary | ICD-10-CM

## 2017-07-12 MED ORDER — SITAGLIPTIN PHOS-METFORMIN HCL 50-1000 MG PO TABS: 1.0000 | ORAL_TABLET | Freq: Two times a day (BID) | ORAL | 1 refills | Status: DC

## 2017-07-12 NOTE — Telephone Encounter (Signed)
Copied from CRM [phone]. Topic: Quick Communication - See Telephone Encounter >> Jul 12, 2017 10:54 AM Jolayne Hainesaylor, Brittany L wrote: CRM for notification. See Telephone encounter for:   07/12/17.  [facility] called and stated they received a request for janumet 50-1000mg , her insurance will only pay for 90 supply at a time at a retail store. They just want to know if they can get a 90 script sent over so it can be covered. Fax number is [phone]

## 2017-07-12 NOTE — Telephone Encounter (Signed)
Needs Dr. Yetta BarreJones approval. Thanks.

## 2017-07-12 NOTE — Telephone Encounter (Signed)
erx has been resent for 90 day supply.

## 2017-07-12 NOTE — Telephone Encounter (Signed)
Pt number on file is not a working number.   Deleted number.   Not able to reach pt to inform 90 day supply has been sent.

## 2017-07-15 DIAGNOSIS — E113513 Type 2 diabetes mellitus with proliferative diabetic retinopathy with macular edema, bilateral: Secondary | ICD-10-CM | POA: Diagnosis not present

## 2017-08-09 DIAGNOSIS — H35033 Hypertensive retinopathy, bilateral: Secondary | ICD-10-CM | POA: Diagnosis not present

## 2017-08-09 DIAGNOSIS — E113513 Type 2 diabetes mellitus with proliferative diabetic retinopathy with macular edema, bilateral: Secondary | ICD-10-CM | POA: Diagnosis not present

## 2017-08-09 DIAGNOSIS — H3582 Retinal ischemia: Secondary | ICD-10-CM | POA: Diagnosis not present

## 2017-08-09 DIAGNOSIS — E113512 Type 2 diabetes mellitus with proliferative diabetic retinopathy with macular edema, left eye: Secondary | ICD-10-CM | POA: Diagnosis not present

## 2017-08-17 ENCOUNTER — Encounter: Payer: Self-pay | Admitting: Family Medicine

## 2017-08-17 ENCOUNTER — Ambulatory Visit: Payer: 59 | Admitting: Family Medicine

## 2017-08-17 VITALS — BP 142/83 | HR 68 | Temp 99.7°F | Ht 61.0 in | Wt 273.0 lb

## 2017-08-17 DIAGNOSIS — J069 Acute upper respiratory infection, unspecified: Secondary | ICD-10-CM

## 2017-08-17 NOTE — Progress Notes (Signed)
Lori Mclaughlin - 57 y.o. female MRN [id number]  Date of birth: 22-Sep-1960  SUBJECTIVE:  Including CC & ROS.  Chief Complaint  Patient presents with  . Cough    Lori Mclaughlin is a 57 y.o. female that is presenting with cough and body aches. Symptoms have been ongoing for three days. Denies fever. She has not been taking Coricidin with no improvement. She did not  get the flu shot. She has been around coworkers that have been sick. She has not had improvement of her symptoms. Has been around people with similar symptoms.     Review of Systems  Constitutional: Negative for fever.  HENT: Positive for sinus pressure.   Respiratory: Positive for cough. Negative for shortness of breath.   Cardiovascular: Negative for chest pain.  Gastrointestinal: Negative for abdominal pain.  Skin: Negative for color change.  Neurological: Negative for weakness.  Hematological: Negative for adenopathy.  Psychiatric/Behavioral: Negative for agitation.    HISTORY: Past Medical, Surgical, Social, and Family History Reviewed & Updated per EMR.   Pertinent Historical Findings include:  Past Medical History:  Diagnosis Date  . Allergic rhinitis   . Asthma   . Atypical chest pain    a. 03/2014: normal nuclear stress test.  . Chronic diastolic CHF (congestive heart failure) (HCC)    a. Dx 03/2014 with echo - moderate LVH, EF 55-60%, grade 1 diastolic dysfunction, mildly dilated LA.  . CKD (chronic kidney disease), stage II   . Diabetes mellitus type II   . GERD (gastroesophageal reflux disease)   . Headache(784.0)    when my bp is up  . Hypertension   . Hypertensive heart disease   . Microcytic anemia   . Osteoarthritis     Past Surgical History:  Procedure Laterality Date  . SHOULDER ARTHROSCOPY Bilateral   . TUBAL LIGATION      Allergies  Allergen Reactions  . Latex     REACTION: itching,rash  . Lisinopril     REACTION: headache    Family History  Problem Relation Age of  Onset  . Diabetes Other   . Hypertension Other   . Stroke Other   . Cancer Other        colon; prostate  . Stroke Mother   . Cancer Father   . Hypertension Brother   . Kidney disease Neg Hx   . Alcohol abuse Neg Hx   . Asthma Neg Hx   . COPD Neg Hx   . Depression Neg Hx   . Drug abuse Neg Hx   . Early death Neg Hx   . Hearing loss Neg Hx   . Heart disease Neg Hx   . Hyperlipidemia Neg Hx      Social History   Socioeconomic History  . Marital status: Married    Spouse name: Not on file  . Number of children: Not on file  . Years of education: Not on file  . Highest education level: Not on file  Social Needs  . Financial resource strain: Not on file  . Food insecurity - worry: Not on file  . Food insecurity - inability: Not on file  . Transportation needs - medical: Not on file  . Transportation needs - non-medical: Not on file  Occupational History  . Not on file  Tobacco Use  . Smoking status: Former Smoker    Last attempt to quit: 08/29/2010    Years since quitting: 6.9  . Smokeless tobacco: Never Used  Substance and Sexual  Activity  . Alcohol use: No    Alcohol/week: 3.0 oz    Types: 5 Glasses of wine per week    Comment: quit 4 years  . Drug use: No  . Sexual activity: Yes    Birth control/protection: Surgical  Other Topics Concern  . Not on file  Social History Narrative   Regular Exercise- yes     PHYSICAL EXAM:  VS: BP (!) 142/83 (BP Location: Left Arm, Patient Position: Sitting, Cuff Size: Large)   Pulse 68   Temp 99.7 F (37.6 C) (Oral)   Ht 5\' 1"  (1.549 m)   Wt 273 lb (123.8 kg)   LMP 02/23/2012   SpO2 97%   BMI 51.58 kg/m  Physical Exam Gen: NAD, alert, cooperative with exam,  ENT: normal lips, normal nasal mucosa, tympanic membranes clear and intact bilaterally, normal oropharynx, no cervical lymphadenopathy Eye: normal EOM, normal conjunctiva and lids CV:  no edema, +2 pedal pulses, regular rate and rhythm, S1-S2   Resp: no accessory  muscle use, non-labored, clear to auscultation bilaterally, no crackles or wheezes Skin: no rashes, no areas of induration  Neuro: normal tone, normal sensation to touch Psych:  normal insight, alert and oriented MSK: Normal gait, normal strength       ASSESSMENT & PLAN:   Upper respiratory tract infection Likely viral in nature.  - counseled on supportive care  - given indications to return.

## 2017-08-17 NOTE — Patient Instructions (Signed)
Please try things such as zyrtec-D or allegra-D which is an antihistamine and decongestant.   Please try afrin which will help with nasal congestion but use for only three days.   Please also try using a netti pot on a regular occasion.  Honey can help with a sore throat.   Delsym can help with a cough.  

## 2017-08-18 DIAGNOSIS — J069 Acute upper respiratory infection, unspecified: Secondary | ICD-10-CM | POA: Insufficient documentation

## 2017-08-18 NOTE — Assessment & Plan Note (Signed)
Likely viral in nature.  - counseled on supportive care - given indications to return.  

## 2017-08-24 ENCOUNTER — Encounter (HOSPITAL_BASED_OUTPATIENT_CLINIC_OR_DEPARTMENT_OTHER): Payer: Self-pay | Admitting: Respiratory Therapy

## 2017-08-24 ENCOUNTER — Encounter: Payer: Self-pay | Admitting: Internal Medicine

## 2017-08-24 ENCOUNTER — Emergency Department (HOSPITAL_BASED_OUTPATIENT_CLINIC_OR_DEPARTMENT_OTHER): Payer: 59

## 2017-08-24 ENCOUNTER — Emergency Department (HOSPITAL_BASED_OUTPATIENT_CLINIC_OR_DEPARTMENT_OTHER)
Admission: EM | Admit: 2017-08-24 | Discharge: 2017-08-24 | Disposition: A | Discharge status: Home / Self Care | Payer: 59 | Attending: Emergency Medicine | Admitting: Emergency Medicine

## 2017-08-24 ENCOUNTER — Other Ambulatory Visit: Payer: Self-pay

## 2017-08-24 DIAGNOSIS — I130 Hypertensive heart and chronic kidney disease with heart failure and stage 1 through stage 4 chronic kidney disease, or unspecified chronic kidney disease: Secondary | ICD-10-CM | POA: Insufficient documentation

## 2017-08-24 DIAGNOSIS — I5032 Chronic diastolic (congestive) heart failure: Secondary | ICD-10-CM | POA: Insufficient documentation

## 2017-08-24 DIAGNOSIS — Z87891 Personal history of nicotine dependence: Secondary | ICD-10-CM | POA: Diagnosis not present

## 2017-08-24 DIAGNOSIS — Z91040 Latex allergy status: Secondary | ICD-10-CM | POA: Insufficient documentation

## 2017-08-24 DIAGNOSIS — E119 Type 2 diabetes mellitus without complications: Secondary | ICD-10-CM | POA: Diagnosis not present

## 2017-08-24 DIAGNOSIS — Z7982 Long term (current) use of aspirin: Secondary | ICD-10-CM | POA: Diagnosis not present

## 2017-08-24 DIAGNOSIS — R0602 Shortness of breath: Secondary | ICD-10-CM | POA: Diagnosis not present

## 2017-08-24 DIAGNOSIS — J069 Acute upper respiratory infection, unspecified: Secondary | ICD-10-CM

## 2017-08-24 DIAGNOSIS — R079 Chest pain, unspecified: Secondary | ICD-10-CM | POA: Diagnosis not present

## 2017-08-24 DIAGNOSIS — N182 Chronic kidney disease, stage 2 (mild): Secondary | ICD-10-CM | POA: Insufficient documentation

## 2017-08-24 DIAGNOSIS — J45909 Unspecified asthma, uncomplicated: Secondary | ICD-10-CM | POA: Diagnosis not present

## 2017-08-24 DIAGNOSIS — B9789 Other viral agents as the cause of diseases classified elsewhere: Secondary | ICD-10-CM | POA: Diagnosis not present

## 2017-08-24 DIAGNOSIS — Z794 Long term (current) use of insulin: Secondary | ICD-10-CM | POA: Diagnosis not present

## 2017-08-24 DIAGNOSIS — Z79899 Other long term (current) drug therapy: Secondary | ICD-10-CM | POA: Insufficient documentation

## 2017-08-24 DIAGNOSIS — R0789 Other chest pain: Secondary | ICD-10-CM | POA: Diagnosis not present

## 2017-08-24 DIAGNOSIS — R51 Headache: Secondary | ICD-10-CM | POA: Insufficient documentation

## 2017-08-24 DIAGNOSIS — M791 Myalgia, unspecified site: Secondary | ICD-10-CM | POA: Diagnosis not present

## 2017-08-24 DIAGNOSIS — R05 Cough: Secondary | ICD-10-CM | POA: Diagnosis not present

## 2017-08-24 LAB — TROPONIN I

## 2017-08-24 MED ORDER — ALBUTEROL SULFATE HFA 108 (90 BASE) MCG/ACT IN AERS
2.0000 | INHALATION_SPRAY | RESPIRATORY_TRACT | Status: DC
  Administered 2017-08-24: 2 via RESPIRATORY_TRACT
  Filled 2017-08-24: qty 6.7

## 2017-08-24 MED ORDER — GUAIFENESIN-CODEINE 100-10 MG/5ML PO SYRP: 5.0000 mL | ORAL_SOLUTION | Freq: Three times a day (TID) | ORAL | 0 refills | Status: DC | PRN

## 2017-08-24 MED ORDER — ALBUTEROL SULFATE (2.5 MG/3ML) 0.083% IN NEBU
5.0000 mg | INHALATION_SOLUTION | Freq: Once | RESPIRATORY_TRACT | Status: AC
  Administered 2017-08-24: 5 mg via RESPIRATORY_TRACT
  Filled 2017-08-24: qty 6

## 2017-08-24 MED ORDER — BENZONATATE 100 MG PO CAPS: 100.0000 mg | ORAL_CAPSULE | Freq: Three times a day (TID) | ORAL | 0 refills | Status: DC

## 2017-08-24 MED ORDER — IPRATROPIUM-ALBUTEROL 0.5-2.5 (3) MG/3ML IN SOLN
3.0000 mL | Freq: Four times a day (QID) | RESPIRATORY_TRACT | Status: DC
  Administered 2017-08-24: 3 mL via RESPIRATORY_TRACT
  Filled 2017-08-24: qty 3

## 2017-08-24 MED FILL — BENZONATATE 100 MG CAPSULE: 100 | 7 days supply | Qty: 21 | Fill #0

## 2017-08-24 MED FILL — GUAIATUSSIN AC LIQUID: 100-10 | 8 days supply | Qty: 120 | Fill #0

## 2017-08-24 NOTE — ED Notes (Signed)
Patient transported to X-ray 

## 2017-08-24 NOTE — ED Triage Notes (Signed)
Patient states that she has had cough and cold with some chest tightness with taking a deep breath x 2 -3 days

## 2017-08-24 NOTE — ED Provider Notes (Signed)
[facility] EMERGENCY DEPARTMENT Provider Note   CSN: [id number] Arrival date & time: 08/24/17  1019     History   Chief Complaint Chief Complaint  Patient presents with  . Cough    HPI Lori Mclaughlin is a 57 y.o. female.  HPI  Lori Mclaughlin is a 57yo female with a history of IDD, hypertension, diastolic CHF (EF 40-98%), osteoarthritis, asthma who presents to the emergency department for evaluation of cough, chest pain, headache, sore throat, body aches.  Patient states that she was diagnosed with the flu about a week ago.  Reports that she continues to have a nonproductive cough with some associated chest tightness and wheezing.  She feels short of breath with episodes of coughing.  She states that her cough and chest tightness was improved after receiving an albuterol nebulizer treatment in the ED. She also endorses constant bilateral chest pain which is non-radiating. Her pain is worsened with cough and reproducible with chest palpation.  Chest pain feels sharp and stabbing.  States that she also has a scratchy sore throat and a moderate frontal headache which has been persistent for a week now. She also has body aches. Reports that she has taken over-the-counter Robitussin for cough and ibuprofen for headache with some relief.  Denies fevers, chills, visual disturbance, numbness, weakness, nausea/vomiting, abdominal pain, dysuria, diarrhea.  Denies pleuritic chest pain, history of DVT/PE, recent surgery or immobilization, exogenous estrogen use, leg swelling or calf tenderness. Denies history of ischemic heart disease. Denies tobacco use.   Per chart review, patient had a negative nuclear stress test 03/2014.  Past Medical History:  Diagnosis Date  . Allergic rhinitis   . Asthma   . Atypical chest pain    a. 03/2014: normal nuclear stress test.  . Chronic diastolic CHF (congestive heart failure) (HCC)    a. Dx 03/2014 with echo - moderate LVH, EF 55-60%, grade  1 diastolic dysfunction, mildly dilated LA.  . CKD (chronic kidney disease), stage II   . Diabetes mellitus type II   . GERD (gastroesophageal reflux disease)   . Headache(784.0)    when my bp is up  . Hypertension   . Hypertensive heart disease   . Microcytic anemia   . Osteoarthritis     Patient Active Problem List   Diagnosis Date Noted  . Upper respiratory tract infection 08/18/2017  . Myalgia 03/10/2017  . Screen for colon cancer 06/12/2015  . Visit for screening mammogram 06/12/2015  . Hypersomnia 04/11/2014  . Morbid obesity (HCC) 04/11/2014  . Bradycardia, drug induced 04/11/2014  . Chronic diastolic CHF (congestive heart failure), NYHA class 2 (HCC) 03/30/2014  . Low back pain radiating to left leg 01/26/2012  . Sleep apnea 11/13/2011  . Hyperlipidemia with target LDL less than 70 01/14/2011  . Type II diabetes mellitus with renal manifestations, uncontrolled (HCC) 08/05/2009  . Essential hypertension, malignant 08/05/2009  . ALLERGIC RHINITIS 08/05/2009  . ASTHMA 08/05/2009  . GERD 08/05/2009  . OSTEOARTHRITIS 08/05/2009    Past Surgical History:  Procedure Laterality Date  . SHOULDER ARTHROSCOPY Bilateral   . TUBAL LIGATION      OB History    No data available       Home Medications    Prior to Admission medications   Medication Sig Start Date End Date Taking? Authorizing Provider  amLODipine (NORVASC) 5 MG tablet Take 1 tablet (5 mg total) by mouth daily. 03/10/17   Etta Grandchild, MD  aspirin EC 81 MG EC  tablet Take 1 tablet (81 mg total) by mouth daily. 04/01/14   Dunn, Tacey Ruiz, PA-C  chlorthalidone (HYGROTON) 25 MG tablet TAKE ONE TABLET BY MOUTH ONCE DAILY 04/02/17   Etta Grandchild, MD  cloNIDine (CATAPRES - DOSED IN MG/24 HR) 0.1 mg/24hr patch Place 1 patch (0.1 mg total) onto the skin once a week. 03/10/17   Etta Grandchild, MD  dapagliflozin propanediol (FARXIGA) 10 MG TABS tablet Take 10 mg by mouth daily. 03/10/17   Etta Grandchild, MD  glucose  blood [facility] VERIO) test strip Use TID 04/28/16   Etta Grandchild, MD  Insulin Detemir (LEVEMIR FLEXTOUCH) 100 UNIT/ML Pen Inject 70 Units into the skin daily at 10 pm. 03/10/17   Etta Grandchild, MD  Insulin Pen Needle (PEN NEEDLES) 32G X 6 MM MISC 1 pen by Does not apply route at bedtime. Use to administer Levemir insulin daily. Dx E11.9 by Does not apply route. Use to administer Levemir insulin daily. Dx E11.9 05/05/16   Etta Grandchild, MD  nebivolol (BYSTOLIC) 10 MG tablet Take 1 tablet (10 mg total) by mouth daily. 09/02/16   Etta Grandchild, MD  sitaGLIPtin-metformin (JANUMET) 50-1000 MG tablet Take 1 tablet by mouth 2 (two) times daily with a meal. 07/12/17   Etta Grandchild, MD  telmisartan (MICARDIS) 80 MG tablet Take 1 tablet (80 mg total) by mouth daily. 09/02/16   Etta Grandchild, MD    Family History Family History  Problem Relation Age of Onset  . Stroke Mother   . Cancer Father   . Hypertension Brother   . Diabetes Other   . Hypertension Other   . Stroke Other   . Cancer Other        colon; prostate  . Kidney disease Neg Hx   . Alcohol abuse Neg Hx   . Asthma Neg Hx   . COPD Neg Hx   . Depression Neg Hx   . Drug abuse Neg Hx   . Early death Neg Hx   . Hearing loss Neg Hx   . Heart disease Neg Hx   . Hyperlipidemia Neg Hx     Social History Social History   Tobacco Use  . Smoking status: Former Smoker    Last attempt to quit: 08/29/2010    Years since quitting: 6.9  . Smokeless tobacco: Never Used  Substance Use Topics  . Alcohol use: No    Alcohol/week: 3.0 oz    Types: 5 Glasses of wine per week    Comment: quit 4 years  . Drug use: No     Allergies   Latex and Lisinopril   Review of Systems Review of Systems  Constitutional: Negative for chills, fatigue and fever.  HENT: Positive for sore throat. Negative for congestion, ear pain and trouble swallowing.   Eyes: Negative for visual disturbance.  Respiratory: Positive for cough, chest tightness,  shortness of breath and wheezing.   Cardiovascular: Positive for chest pain. Negative for leg swelling.  Gastrointestinal: Negative for abdominal pain, diarrhea, nausea and vomiting.  Genitourinary: Negative for dysuria and frequency.  Musculoskeletal: Positive for myalgias (generalized).  Skin: Negative for rash.  Neurological: Positive for headaches. Negative for weakness and numbness.  Psychiatric/Behavioral: Negative for agitation.     Physical Exam Updated Vital Signs BP (!) 178/83   Pulse 73   Temp 98.7 F (37.1 C) (Oral)   Resp 20   Ht 5\' 1"  (1.549 m)   Wt 123.8 kg (273 lb)  LMP 02/23/2012   SpO2 99%   BMI 51.58 kg/m   Physical Exam  Constitutional: She is oriented to person, place, and time. She appears well-developed and well-nourished. No distress.  Sitting at bedside, in no apparent distress.   HENT:  Head: Normocephalic and atraumatic.  Mouth/Throat: No oropharyngeal exudate.  Mucous membranes moist. Oropharynx non-erythematous. Uvula midline. No tonsillar edema or exudate. Airway patent. Able to handle oral secretions. Bilateral TMs with good cone of light.   Eyes: Conjunctivae are normal. Pupils are equal, round, and reactive to light. Right eye exhibits no discharge. Left eye exhibits no discharge.  Neck:  Tender anterior cervical adenopathy  Cardiovascular: Normal rate, regular rhythm and intact distal pulses. Exam reveals no friction rub.  No murmur heard. Pulmonary/Chest: Effort normal. No respiratory distress.  No respiratory distress. Patient able to speak in full sentences. Good air movement. Mild wheezing in bilateral lung bases. No rales, rhonchi or stridor. Tender to palpation grossly over the chest. No overlying rash, bruising or erythema.   Abdominal: Soft. Bowel sounds are normal. There is no tenderness. There is no guarding.  Musculoskeletal: Normal range of motion.  No leg swelling or calf tenderness.   Neurological: She is alert and oriented to  person, place, and time. Coordination normal.  Skin: Skin is warm and dry. She is not diaphoretic.  Psychiatric: She has a normal mood and affect. Her behavior is normal.  Nursing note and vitals reviewed.    ED Treatments / Results  Labs (all labs ordered are listed, but only abnormal results are displayed) Labs Reviewed  TROPONIN I    EKG  EKG Interpretation  Date/Time:  Tuesday August 24 2017 10:36:43 EST Ventricular Rate:  94 PR Interval:    QRS Duration: 100 QT Interval:  393 QTC Calculation: 492 R Axis:   -37 Text Interpretation:  Sinus rhythm Left axis deviation Low voltage, precordial leads Abnormal R-wave progression, late transition Borderline prolonged QT interval Baseline wander in lead(s) V1 V4 since last tracing no significant change Confirmed by Rolan BuccoBelfi, Melanie [phone]) on 08/24/2017 10:43:36 AM Also confirmed by Rolan BuccoBelfi, Melanie [phone]), editor Barbette Hairassel, Kerry [phone]([id number])  on 08/24/2017 10:51:38 AM       Radiology Dg Chest 2 View  Result Date: 08/24/2017 CLINICAL DATA:  Cough, cold, chest tightness EXAM: CHEST  2 VIEW COMPARISON:  06/12/2015 FINDINGS: The heart size and mediastinal contours are within normal limits. Both lungs are clear. The visualized skeletal structures are unremarkable. IMPRESSION: No active cardiopulmonary disease. Electronically Signed   By: Elige KoHetal  Patel   On: 08/24/2017 10:51    Procedures Procedures (including critical care time)  Medications Ordered in ED Medications  ipratropium-albuterol (DUONEB) 0.5-2.5 (3) MG/3ML nebulizer solution 3 mL (3 mLs Nebulization Given 08/24/17 1217)  albuterol (PROVENTIL) (2.5 MG/3ML) 0.083% nebulizer solution 5 mg (5 mg Nebulization Given 08/24/17 1054)     Initial Impression / Assessment and Plan / ED Course  I have reviewed the triage vital signs and the nursing notes.  Pertinent labs & imaging results that were available during my care of the patient were reviewed by me and considered in my medical  decision making (see chart for details).     Feel that chest pain is musculoskeletal given reproducible to palpation and worsened with cough. Do not believe this is ACS given her pain has been constant for three days and troponin negative, EKG non-ischemic. Do not suspect PE given no history of DVT/PE, tachycardia, calf swelling or tenderness, recent immobilization, or exogenous estrogen.  Not likely aortic dissection given no neurological complaints, pulses equal bilaterally, no new murmur.  Pt CXR negative for acute infiltrate. Patients symptoms are consistent with URI, likely viral etiology. Discussed that antibiotics are not indicated for viral infections. Pt will be discharged with symptomatic treatment. She did have some wheezing on initial exam, improved after duoneb treatment. Will send patient home with albuterol inhaler PRN for wheezing and cough suppressant. Her BP was elevated in the ER today, counseled her to have this rechecked by her PCP for further medication management. Counseled her on return precautions. She verbalizes understanding and is agreeable with plan. Pt is has no complaints and in NAD prior to dc.  Final Clinical Impressions(s) / ED Diagnoses   Final diagnoses:  Viral URI with cough    ED Discharge Orders        Ordered    guaiFENesin-codeine [facility]) 100-10 MG/5ML syrup  3 times daily PRN     08/24/17 1251    benzonatate (TESSALON) 100 MG capsule  Every 8 hours     08/24/17 1251       Kellie Shropshire, PA-C 08/24/17 1905    Rolan Bucco, MD 08/25/17 [phone]

## 2017-08-24 NOTE — Progress Notes (Signed)
Outside notes received. Information abstracted. Notes sent to scan.  

## 2017-08-24 NOTE — Discharge Instructions (Signed)
Blood work, EKG and chest xray were reassuring.   You have a viral upper respiratory tract infection.   Please take albuterol inhaler before hours as needed for wheezing and shortness of breath.  I have also written you a prescription for cough medicine with codeine in it.  This medicine can make you drowsy so please do not drive, work or drink alcohol while taking it.  I have written you a prescription for Tessalon Perles as well for cough.  Return to the ER if you have trouble breathing, chest pain that radiates to the left arm, chest pain with nausea or vomiting or chest pain worsens or changes in any way.

## 2017-08-24 NOTE — ED Notes (Signed)
X-ray delay, ED tech starting EKG.

## 2017-08-24 NOTE — ED Notes (Signed)
Patient transported to Ultrasound 

## 2017-08-30 DIAGNOSIS — E113511 Type 2 diabetes mellitus with proliferative diabetic retinopathy with macular edema, right eye: Secondary | ICD-10-CM | POA: Diagnosis not present

## 2017-08-30 DIAGNOSIS — E113513 Type 2 diabetes mellitus with proliferative diabetic retinopathy with macular edema, bilateral: Secondary | ICD-10-CM | POA: Diagnosis not present

## 2017-09-03 ENCOUNTER — Ambulatory Visit: Payer: 59 | Admitting: Family

## 2017-09-03 ENCOUNTER — Encounter: Payer: Self-pay | Admitting: Family

## 2017-09-03 VITALS — BP 162/88 | HR 88 | Temp 99.0°F | Ht 61.0 in | Wt 263.0 lb

## 2017-09-03 DIAGNOSIS — E1165 Type 2 diabetes mellitus with hyperglycemia: Secondary | ICD-10-CM

## 2017-09-03 DIAGNOSIS — J209 Acute bronchitis, unspecified: Secondary | ICD-10-CM

## 2017-09-03 DIAGNOSIS — I10 Essential (primary) hypertension: Secondary | ICD-10-CM | POA: Diagnosis not present

## 2017-09-03 DIAGNOSIS — E1129 Type 2 diabetes mellitus with other diabetic kidney complication: Secondary | ICD-10-CM | POA: Diagnosis not present

## 2017-09-03 DIAGNOSIS — IMO0002 Reserved for concepts with insufficient information to code with codable children: Secondary | ICD-10-CM

## 2017-09-03 MED ORDER — FLUCONAZOLE 150 MG PO TABS: 150.0000 mg | ORAL_TABLET | Freq: Once | ORAL | 0 refills | Status: AC

## 2017-09-03 MED ORDER — CEFDINIR 300 MG PO CAPS: 300.0000 mg | ORAL_CAPSULE | Freq: Two times a day (BID) | ORAL | 0 refills | Status: DC

## 2017-09-03 MED ORDER — BENZONATATE 100 MG PO CAPS: 100.0000 mg | ORAL_CAPSULE | Freq: Three times a day (TID) | ORAL | 0 refills | Status: DC

## 2017-09-03 NOTE — Progress Notes (Signed)
Lori Mclaughlin is a 57 y.o. female with the following history as recorded in [employer]:  Patient Active Problem List   Diagnosis Date Noted  . Upper respiratory tract infection 08/18/2017  . Myalgia 03/10/2017  . Screen for colon cancer 06/12/2015  . Visit for screening mammogram 06/12/2015  . Hypersomnia 04/11/2014  . Morbid obesity (HCC) 04/11/2014  . Bradycardia, drug induced 04/11/2014  . Chronic diastolic CHF (congestive heart failure), NYHA class 2 (HCC) 03/30/2014  . Low back pain radiating to left leg 01/26/2012  . Sleep apnea 11/13/2011  . Hyperlipidemia with target LDL less than 70 01/14/2011  . Type II diabetes mellitus with renal manifestations, uncontrolled (HCC) 08/05/2009  . Essential hypertension, malignant 08/05/2009  . ALLERGIC RHINITIS 08/05/2009  . ASTHMA 08/05/2009  . GERD 08/05/2009  . OSTEOARTHRITIS 08/05/2009    Current Outpatient Medications  Medication Sig Dispense Refill  . amLODipine (NORVASC) 5 MG tablet Take 1 tablet (5 mg total) by mouth daily. 90 tablet 1  . aspirin EC 81 MG EC tablet Take 1 tablet (81 mg total) by mouth daily.    . benzonatate (TESSALON) 100 MG capsule Take 1 capsule (100 mg total) by mouth every 8 (eight) hours. 21 capsule 0  . benzonatate (TESSALON) 100 MG capsule Take 1 capsule (100 mg total) by mouth every 8 (eight) hours. 21 capsule 0  . chlorthalidone (HYGROTON) 25 MG tablet TAKE ONE TABLET BY MOUTH ONCE DAILY 90 tablet 1  . cloNIDine (CATAPRES - DOSED IN MG/24 HR) 0.1 mg/24hr patch Place 1 patch (0.1 mg total) onto the skin once a week. 12 patch 1  . dapagliflozin propanediol (FARXIGA) 10 MG TABS tablet Take 10 mg by mouth daily. 90 tablet 1  . glucose blood (ONETOUCH VERIO) test strip Use TID 100 each 11  . guaiFENesin-codeine (ROBITUSSIN AC) 100-10 MG/5ML syrup Take 5 mLs by mouth 3 (three) times daily as needed for cough. 120 mL 0  . Insulin Detemir (LEVEMIR FLEXTOUCH) 100 UNIT/ML Pen Inject 70 Units into the skin  daily at 10 pm. 45 mL 3  . Insulin Pen Needle (PEN NEEDLES) 32G X 6 MM MISC 1 pen by Does not apply route at bedtime. Use to administer Levemir insulin daily. Dx E11.9 by Does not apply route. Use to administer Levemir insulin daily. Dx E11.9 90 each 3  . nebivolol (BYSTOLIC) 10 MG tablet Take 1 tablet (10 mg total) by mouth daily. 90 tablet 1  . sitaGLIPtin-metformin (JANUMET) 50-1000 MG tablet Take 1 tablet by mouth 2 (two) times daily with a meal. 180 tablet 1  . telmisartan (MICARDIS) 80 MG tablet Take 1 tablet (80 mg total) by mouth daily. 90 tablet 1  . cefdinir (OMNICEF) 300 MG capsule Take 1 capsule (300 mg total) by mouth 2 (two) times daily. 20 capsule 0  . fluconazole (DIFLUCAN) 150 MG tablet Take 1 tablet (150 mg total) by mouth once for 1 dose. Repeat after 72 hours 2 tablet 0   No current facility-administered medications for this visit.     Allergies: Latex and Lisinopril  Past Medical History:  Diagnosis Date  . Allergic rhinitis   . Asthma   . Atypical chest pain    a. 03/2014: normal nuclear stress test.  . Chronic diastolic CHF (congestive heart failure) (HCC)    a. Dx 03/2014 with echo - moderate LVH, EF 55-60%, grade 1 diastolic dysfunction, mildly dilated LA.  . CKD (chronic kidney disease), stage II   . Diabetes mellitus type II   .  GERD (gastroesophageal reflux disease)   . Headache(784.0)    when my bp is up  . Hypertension   . Hypertensive heart disease   . Microcytic anemia   . Osteoarthritis     Past Surgical History:  Procedure Laterality Date  . SHOULDER ARTHROSCOPY Bilateral   . TUBAL LIGATION      Family History  Problem Relation Age of Onset  . Stroke Mother   . Cancer Father   . Hypertension Brother   . Diabetes Other   . Hypertension Other   . Stroke Other   . Cancer Other        colon; prostate  . Kidney disease Neg Hx   . Alcohol abuse Neg Hx   . Asthma Neg Hx   . COPD Neg Hx   . Depression Neg Hx   . Drug abuse Neg Hx   . Early  death Neg Hx   . Hearing loss Neg Hx   . Heart disease Neg Hx   . Hyperlipidemia Neg Hx     Social History   Tobacco Use  . Smoking status: Former Smoker    Last attempt to quit: 08/29/2010    Years since quitting: 7.0  . Smokeless tobacco: Never Used  Substance Use Topics  . Alcohol use: No    Alcohol/week: 3.0 oz    Types: 5 Glasses of wine per week    Comment: quit 4 years    Subjective:  Patient started with flu-like symptoms approximately 2 weeks ago; seen here on 1/22 and diagnosed with viral infection; went to the ER a few days later and had normal CXR and still felt to be viral; notes that just cannot shake the infection; + wheezing/ chest feels tight; using her albuterol at least 2 x per day but does not like how she feels on the albuterol; not sleeping well due to cough; requesting refill on Tessalon Perles if possible;   Objective:  Vitals:   09/03/17 1028  BP: (!) 162/88  Pulse: 88  Temp: 99 F (37.2 C)  TempSrc: Oral  SpO2: 99%  Weight: 263 lb 0.6 oz (119.3 kg)  Height: 5\' 1"  (1.549 m)    General: Well developed, well nourished, in no acute distress  Skin : Warm and dry.  Head: Normocephalic and atraumatic  Eyes: Sclera and conjunctiva clear; pupils round and reactive to light; extraocular movements intact  Ears: External normal; canals clear; tympanic membranes normal  Oropharynx: Pink, supple. No suspicious lesions  Neck: Supple without thyromegaly, adenopathy  Lungs: Respirations unlabored; wheezing in upper lobes CVS exam: normal rate and regular rhythm.  Neurologic: Alert and oriented; speech intact; face symmetrical; moves all extremities well; CNII-XII intact without focal deficit   Assessment:  1. Acute bronchitis, unspecified organism   2. Essential hypertension, malignant   3. Type II diabetes mellitus with renal manifestations, uncontrolled (HCC)     Plan:  1. Reviewed notes from recent ER visit- normal CXR on 1/29; will treat with Omnicef 300  bid x 10 days; sample of BREO- use daily as directed; use albuterol as needed for wheezing as she can tolerate; refill on Tessalon Perles; increase fluids, rest and follow-up worse, no better.  2. Appears uncontrolled; suspect illness today is affecting reading but pressure does not appear to have been well controlled recently; she is overdue for OV with her PCP; encouraged to schedule in 2-3 weeks; 3. Uncontrolled; she was referred to endocrine at OV in 02/2017 but cannot tell that she  has been yet; follow-up with her PCP in 2-3 weeks to discuss.   Return in about 2 weeks (around 09/17/2017) for Dr. Yetta BarreJones 6 month follow up diabetes, hypertension.  No orders of the defined types were placed in this encounter.   Requested Prescriptions   Signed Prescriptions Disp Refills  . cefdinir (OMNICEF) 300 MG capsule 20 capsule 0    Sig: Take 1 capsule (300 mg total) by mouth 2 (two) times daily.  . fluconazole (DIFLUCAN) 150 MG tablet 2 tablet 0    Sig: Take 1 tablet (150 mg total) by mouth once for 1 dose. Repeat after 72 hours  . benzonatate (TESSALON) 100 MG capsule 21 capsule 0    Sig: Take 1 capsule (100 mg total) by mouth every 8 (eight) hours.  . benzonatate (TESSALON) 100 MG capsule 21 capsule 0    Sig: Take 1 capsule (100 mg total) by mouth every 8 (eight) hours.

## 2017-09-14 ENCOUNTER — Other Ambulatory Visit (INDEPENDENT_AMBULATORY_CARE_PROVIDER_SITE_OTHER): Payer: 59

## 2017-09-14 ENCOUNTER — Ambulatory Visit: Payer: 59 | Admitting: Internal Medicine

## 2017-09-14 ENCOUNTER — Encounter: Payer: Self-pay | Admitting: Internal Medicine

## 2017-09-14 VITALS — BP 144/84 | HR 90 | Temp 98.7°F | Resp 16 | Ht 61.0 in | Wt 265.0 lb

## 2017-09-14 DIAGNOSIS — E1165 Type 2 diabetes mellitus with hyperglycemia: Secondary | ICD-10-CM | POA: Diagnosis not present

## 2017-09-14 DIAGNOSIS — R05 Cough: Secondary | ICD-10-CM | POA: Diagnosis not present

## 2017-09-14 DIAGNOSIS — E1129 Type 2 diabetes mellitus with other diabetic kidney complication: Secondary | ICD-10-CM

## 2017-09-14 DIAGNOSIS — IMO0002 Reserved for concepts with insufficient information to code with codable children: Secondary | ICD-10-CM

## 2017-09-14 DIAGNOSIS — D539 Nutritional anemia, unspecified: Secondary | ICD-10-CM

## 2017-09-14 DIAGNOSIS — I10 Essential (primary) hypertension: Secondary | ICD-10-CM

## 2017-09-14 DIAGNOSIS — R059 Cough, unspecified: Secondary | ICD-10-CM

## 2017-09-14 LAB — CBC WITH DIFFERENTIAL/PLATELET
BASOS PCT: 1 % (ref 0.0–3.0)
Basophils Absolute: 0.1 10*3/uL (ref 0.0–0.1)
Eosinophils Absolute: 0.4 10*3/uL (ref 0.0–0.7)
Eosinophils Relative: 5.4 % — ABNORMAL HIGH (ref 0.0–5.0)
HCT: 34.4 % — ABNORMAL LOW (ref 36.0–46.0)
Hemoglobin: 11.1 g/dL — ABNORMAL LOW (ref 12.0–15.0)
LYMPHS ABS: 1.9 10*3/uL (ref 0.7–4.0)
Lymphocytes Relative: 23 % (ref 12.0–46.0)
MCHC: 32.2 g/dL (ref 30.0–36.0)
MCV: 75.1 fl — ABNORMAL LOW (ref 78.0–100.0)
MONO ABS: 0.7 10*3/uL (ref 0.1–1.0)
Monocytes Relative: 8 % (ref 3.0–12.0)
Neutro Abs: 5.1 10*3/uL (ref 1.4–7.7)
Neutrophils Relative %: 62.6 % (ref 43.0–77.0)
Platelets: 313 10*3/uL (ref 150.0–400.0)
RBC: 4.58 Mil/uL (ref 3.87–5.11)
RDW: 18 % — AB (ref 11.5–15.5)
WBC: 8.2 10*3/uL (ref 4.0–10.5)

## 2017-09-14 LAB — FOLATE: Folate: 13.8 ng/mL (ref >5.9)

## 2017-09-14 LAB — BASIC METABOLIC PANEL
BUN: 23 mg/dL (ref 6–23)
CALCIUM: 9.7 mg/dL (ref 8.4–10.5)
CO2: 27 mEq/L (ref 19–32)
Chloride: 101 mEq/L (ref 96–112)
Creatinine, Ser: 1.42 mg/dL — ABNORMAL HIGH (ref 0.40–1.20)
GFR: 40.33 mL/min — AB (ref >60.00)
Glucose, Bld: 221 mg/dL — ABNORMAL HIGH (ref 70–99)
Potassium: 4.4 mEq/L (ref 3.5–5.1)
Sodium: 135 mEq/L (ref 135–145)

## 2017-09-14 LAB — IBC PANEL
Iron: 46 ug/dL (ref 42–145)
Saturation Ratios: 14 % — ABNORMAL LOW (ref 20.0–50.0)
Transferrin: 235 mg/dL (ref 212.0–360.0)

## 2017-09-14 LAB — POCT EXHALED NITRIC OXIDE: FeNO level (ppb): 12

## 2017-09-14 LAB — VITAMIN B12: VITAMIN B 12: 598 pg/mL (ref 211–911)

## 2017-09-14 LAB — HEMOGLOBIN A1C: HEMOGLOBIN A1C: 14.2 % — AB (ref 4.6–6.5)

## 2017-09-14 LAB — FERRITIN: Ferritin: 72.9 ng/mL (ref 10.0–291.0)

## 2017-09-14 NOTE — Patient Instructions (Signed)
Anemia Anemia is a condition in which you do not have enough red blood cells or hemoglobin. Hemoglobin is a substance in red blood cells that carries oxygen. When you do not have enough red blood cells or hemoglobin (are anemic), your body cannot get enough oxygen and your organs may not work properly. As a result, you may feel very tired or have other problems. What are the causes? Common causes of anemia include:  Excessive bleeding. Anemia can be caused by excessive bleeding inside or outside the body, including bleeding from the intestine or from periods in women.  Poor nutrition.  Long-lasting (chronic) kidney, thyroid, and liver disease.  Bone marrow disorders.  Cancer and treatments for cancer.  HIV (human immunodeficiency virus) and AIDS (acquired immunodeficiency syndrome).  Treatments for HIV and AIDS.  Spleen problems.  Blood disorders.  Infections, medicines, and autoimmune disorders that destroy red blood cells.  What are the signs or symptoms? Symptoms of this condition include:  Minor weakness.  Dizziness.  Headache.  Feeling heartbeats that are irregular or faster than normal (palpitations).  Shortness of breath, especially with exercise.  Paleness.  Cold sensitivity.  Indigestion.  Nausea.  Difficulty sleeping.  Difficulty concentrating.  Symptoms may occur suddenly or develop slowly. If your anemia is mild, you may not have symptoms. How is this diagnosed? This condition is diagnosed based on:  Blood tests.  Your medical history.  A physical exam.  Bone marrow biopsy.  Your health care provider may also check your stool (feces) for blood and may do additional testing to look for the cause of your bleeding. You may also have other tests, including:  Imaging tests, such as a CT scan or MRI.  Endoscopy.  Colonoscopy.  How is this treated? Treatment for this condition depends on the cause. If you continue to lose a lot of blood,  you may need to be treated at a hospital. Treatment may include:  Taking supplements of iron, vitamin M09, or folic acid.  Taking a hormone medicine (erythropoietin) that can help to stimulate red blood cell growth.  Having a blood transfusion. This may be needed if you lose a lot of blood.  Making changes to your diet.  Having surgery to remove your spleen.  Follow these instructions at home:  Take over-the-counter and prescription medicines only as told by your health care provider.  Take supplements only as told by your health care provider.  Follow any diet instructions that you were given.  Keep all follow-up visits as told by your health care provider. This is important. Contact a health care provider if:  You develop new bleeding anywhere in the body. Get help right away if:  You are very weak.  You are short of breath.  You have pain in your abdomen or chest.  You are dizzy or feel faint.  You have trouble concentrating.  You have bloody or black, tarry stools.  You vomit repeatedly or you vomit up blood. Summary  Anemia is a condition in which you do not have enough red blood cells or enough of a substance in your red blood cells that carries oxygen (hemoglobin).  Symptoms may occur suddenly or develop slowly.  If your anemia is mild, you may not have symptoms.  This condition is diagnosed with blood tests as well as a medical history and physical exam. Other tests may be needed.  Treatment for this condition depends on the cause of the anemia. This information is not intended to replace advice  given to you by your health care provider. Make sure you discuss any questions you have with your health care provider. Document Released: 08/20/2004 Document Revised: 08/14/2016 Document Reviewed: 08/14/2016 Elsevier Interactive Patient Education  [employer].

## 2017-09-14 NOTE — Progress Notes (Signed)
Subjective:  Patient ID: Lori Mclaughlin, female    DOB: [birth year]  Age: 57 y.o. MRN: [id number]  CC: Anemia; Hypertension; Diabetes; and Cough   HPI Lori Mclaughlin presents for f/up - She was seen by someone else about 2 weeks ago for an upper respiratory infection.  Her chest x-ray was normal.  She has completed a course of Omnicef and is feeling better.  She still has a mild nonproductive cough and laryngitis.  She otherwise feels well and offers no complaints.  She does not know if her blood sugars or blood pressure have been well controlled as she has not been monitoring them.  Outpatient Medications Prior to Visit  Medication Sig Dispense Refill  . amLODipine (NORVASC) 5 MG tablet Take 1 tablet (5 mg total) by mouth daily. 90 tablet 1  . aspirin EC 81 MG EC tablet Take 1 tablet (81 mg total) by mouth daily.    . benzonatate (TESSALON) 100 MG capsule Take 1 capsule (100 mg total) by mouth every 8 (eight) hours. 21 capsule 0  . benzonatate (TESSALON) 100 MG capsule Take 1 capsule (100 mg total) by mouth every 8 (eight) hours. 21 capsule 0  . chlorthalidone (HYGROTON) 25 MG tablet TAKE ONE TABLET BY MOUTH ONCE DAILY 90 tablet 1  . cloNIDine (CATAPRES - DOSED IN MG/24 HR) 0.1 mg/24hr patch Place 1 patch (0.1 mg total) onto the skin once a week. 12 patch 1  . dapagliflozin propanediol (FARXIGA) 10 MG TABS tablet Take 10 mg by mouth daily. 90 tablet 1  . glucose blood (ONETOUCH VERIO) test strip Use TID 100 each 11  . guaiFENesin-codeine (ROBITUSSIN AC) 100-10 MG/5ML syrup Take 5 mLs by mouth 3 (three) times daily as needed for cough. 120 mL 0  . Insulin Detemir (LEVEMIR FLEXTOUCH) 100 UNIT/ML Pen Inject 70 Units into the skin daily at 10 pm. 45 mL 3  . Insulin Pen Needle (PEN NEEDLES) 32G X 6 MM MISC 1 pen by Does not apply route at bedtime. Use to administer Levemir insulin daily. Dx E11.9 by Does not apply route. Use to administer Levemir insulin daily. Dx E11.9 90 each 3    . nebivolol (BYSTOLIC) 10 MG tablet Take 1 tablet (10 mg total) by mouth daily. 90 tablet 1  . sitaGLIPtin-metformin (JANUMET) 50-1000 MG tablet Take 1 tablet by mouth 2 (two) times daily with a meal. 180 tablet 1  . telmisartan (MICARDIS) 80 MG tablet Take 1 tablet (80 mg total) by mouth daily. 90 tablet 1  . cefdinir (OMNICEF) 300 MG capsule Take 1 capsule (300 mg total) by mouth 2 (two) times daily. (Patient not taking: Reported on 09/14/2017) 20 capsule 0   No facility-administered medications prior to visit.     ROS Review of Systems  Constitutional: Negative for chills, diaphoresis, fatigue and fever.  HENT: Positive for voice change. Negative for facial swelling, sinus pressure, sore throat and trouble swallowing.   Eyes: Negative.  Negative for visual disturbance.  Respiratory: Positive for cough. Negative for chest tightness, shortness of breath, wheezing and stridor.   Cardiovascular: Negative for chest pain, palpitations and leg swelling.  Gastrointestinal: Negative for abdominal pain, constipation, diarrhea, nausea and vomiting.  Endocrine: Negative for polydipsia, polyphagia and polyuria.  Genitourinary: Negative.  Negative for decreased urine volume, difficulty urinating, dysuria and hematuria.  Musculoskeletal: Negative.  Negative for arthralgias and myalgias.  Skin: Negative.  Negative for color change and pallor.  Allergic/Immunologic: Negative.   Neurological: Negative.  Negative for  dizziness, weakness and headaches.  Hematological: Does not bruise/bleed easily.  Psychiatric/Behavioral: Negative.     Objective:  BP (!) 144/84 (BP Location: Left Arm, Patient Position: Sitting, Cuff Size: Large)   Pulse 90   Temp 98.7 F (37.1 C) (Oral)   Resp 16   Ht 5\' 1"  (1.549 m)   Wt 265 lb (120.2 kg)   LMP 02/23/2012   SpO2 98%   BMI 50.07 kg/m   BP Readings from Last 3 Encounters:  09/14/17 (!) 144/84  09/03/17 (!) 162/88  08/24/17 (!) 186/91    Wt Readings from  Last 3 Encounters:  09/14/17 265 lb (120.2 kg)  09/03/17 263 lb 0.6 oz (119.3 kg)  08/24/17 273 lb (123.8 kg)    Physical Exam  Constitutional: She is oriented to person, place, and time. No distress.  HENT:  Mouth/Throat: Oropharynx is clear and moist. No oropharyngeal exudate.  Eyes: Conjunctivae are normal. Left eye exhibits no discharge. No scleral icterus.  Neck: Normal range of motion. Neck supple. No JVD present. No thyromegaly present.  Cardiovascular: Normal rate, regular rhythm and normal heart sounds. Exam reveals no gallop.  No murmur heard. Pulmonary/Chest: Effort normal and breath sounds normal. No respiratory distress. She has no wheezes. She has no rales.  Abdominal: Soft. Bowel sounds are normal. She exhibits no distension and no mass. There is no tenderness. There is no guarding.  Musculoskeletal: Normal range of motion. She exhibits no edema or tenderness.  Lymphadenopathy:    She has no cervical adenopathy.  Neurological: She is alert and oriented to person, place, and time.  Skin: Skin is warm and dry. No rash noted. She is not diaphoretic. No erythema. No pallor.  Psychiatric: She has a normal mood and affect. Her behavior is normal. Judgment and thought content normal.  Vitals reviewed.   Lab Results  Component Value Date   WBC 8.2 09/14/2017   HGB 11.1 (L) 09/14/2017   HCT 34.4 (L) 09/14/2017   PLT 313.0 09/14/2017   GLUCOSE 221 (H) 09/14/2017   CHOL 152 03/10/2017   TRIG 120.0 03/10/2017   HDL 44.60 03/10/2017   LDLCALC 83 03/10/2017   ALT 16 03/10/2017   AST 17 03/10/2017   NA 135 09/14/2017   K 4.4 09/14/2017   CL 101 09/14/2017   CREATININE 1.42 (H) 09/14/2017   BUN 23 09/14/2017   CO2 27 09/14/2017   TSH 2.06 03/10/2017   INR 0.96 03/30/2014   HGBA1C 14.2 (H) 09/14/2017   MICROALBUR 17.2 (H) 03/10/2017    Dg Chest 2 View  Result Date: 08/24/2017 CLINICAL DATA:  Cough, cold, chest tightness EXAM: CHEST  2 VIEW COMPARISON:  06/12/2015  FINDINGS: The heart size and mediastinal contours are within normal limits. Both lungs are clear. The visualized skeletal structures are unremarkable. IMPRESSION: No active cardiopulmonary disease. Electronically Signed   By: Elige Ko   On: 08/24/2017 10:51    Assessment & Plan:   Ardine was seen today for anemia, hypertension, diabetes and cough.  Diagnoses and all orders for this visit:  Type II diabetes mellitus with renal manifestations, uncontrolled (HCC)- Her A1c is up to 14.2%.  Her blood sugars are not well controlled.  She will continue the current regimen and I have asked her to see endocrinology and diabetic education as well. -     Basic metabolic panel; Future -     Hemoglobin A1c; Future -     Amb Referral to Nutrition and Diabetic E -  Ambulatory referral to Endocrinology -     Consult to [facility]  Essential hypertension, malignant- Her blood pressure is adequately well controlled.  Electrolytes and renal function are normal. 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  I will screen for vitamin deficiencies.  There also appears to be a component of anemia of chronic disease related to the poorly controlled blood sugars. -     CBC with Differential/Platelet; Future -     IBC panel; Future -     Ferritin; Future -     Folate; Future -     Vitamin B1; Future -     Vitamin B12; Future  Cough- Her FeNO score is not elevated so I do not think she is having an exacerbation of asthma.  Her symptoms are consistent with resolving upper respiratory infection but I do not see any component of a bacterial infection at this time.  She will continue symptom relief with Robitussin-AC as needed. -     POCT EXHALED NITRIC OXIDE   I am having Lori Mclaughlin maintain her aspirin, glucose blood, Pen Needles, nebivolol, telmisartan, dapagliflozin propanediol, amLODipine, Insulin Detemir, cloNIDine, chlorthalidone,  sitaGLIPtin-metformin, guaiFENesin-codeine, cefdinir, benzonatate, and benzonatate.  No orders of the defined types were placed in this encounter.    Follow-up: Return in about 6 weeks (around 10/26/2017).  Sanda Linger, MD

## 2017-09-15 ENCOUNTER — Encounter: Payer: Self-pay | Admitting: Internal Medicine

## 2017-09-16 ENCOUNTER — Ambulatory Visit: Payer: 59 | Admitting: Internal Medicine

## 2017-09-16 DIAGNOSIS — E113513 Type 2 diabetes mellitus with proliferative diabetic retinopathy with macular edema, bilateral: Secondary | ICD-10-CM | POA: Diagnosis not present

## 2017-09-17 ENCOUNTER — Telehealth: Payer: Self-pay | Admitting: Internal Medicine

## 2017-09-17 NOTE — Telephone Encounter (Signed)
Copied from CRM [phone]. Topic: Quick Communication - See Telephone Encounter >> Sep 17, 2017 10:08 AM Lori Mclaughlin, Shiquita C wrote: CRM for notification. See Telephone encounter for: pt called in, she said that she was seen by Dr. Yetta BarreJones and taken out of work, pt says that she is suppose to return to work but she is still unable to talk. She has laryngitis. Pt would like to know if provider could excuse her from work for 1 more week until 09/27/17?   Please advise.   09/17/17.

## 2017-09-19 ENCOUNTER — Encounter: Payer: Self-pay | Admitting: Internal Medicine

## 2017-09-19 ENCOUNTER — Other Ambulatory Visit: Payer: Self-pay | Admitting: Internal Medicine

## 2017-09-19 DIAGNOSIS — E519 Thiamine deficiency, unspecified: Secondary | ICD-10-CM

## 2017-09-19 LAB — VITAMIN B1: Vitamin B1 (Thiamine): <6 nmol/L — ABNORMAL LOW (ref 8–30)

## 2017-09-19 MED ORDER — VITAMIN B-1 100 MG PO TABS: 100.0000 mg | ORAL_TABLET | Freq: Every day | ORAL | 1 refills | Status: DC

## 2017-09-21 NOTE — Telephone Encounter (Signed)
Letter has been printed  ?

## 2017-09-21 NOTE — Telephone Encounter (Signed)
Routing to dr Leanna Battlesjones--please advise if you are ok with this, I can call patient and create note for work if she needs one, thanks

## 2017-09-22 NOTE — Telephone Encounter (Signed)
Patient has been informed. She isn't sure she needs the letter. Her work approved it any ways. I informed patient I will leave it up here for her in cause she needs it when she goes back.

## 2017-09-22 NOTE — Telephone Encounter (Signed)
Can you close this. THANK YOU.

## 2017-09-30 DIAGNOSIS — E113511 Type 2 diabetes mellitus with proliferative diabetic retinopathy with macular edema, right eye: Secondary | ICD-10-CM | POA: Diagnosis not present

## 2017-09-30 DIAGNOSIS — E113512 Type 2 diabetes mellitus with proliferative diabetic retinopathy with macular edema, left eye: Secondary | ICD-10-CM | POA: Diagnosis not present

## 2017-10-13 ENCOUNTER — Encounter: Payer: Self-pay | Admitting: Family

## 2017-10-13 ENCOUNTER — Other Ambulatory Visit (INDEPENDENT_AMBULATORY_CARE_PROVIDER_SITE_OTHER): Payer: 59

## 2017-10-13 ENCOUNTER — Ambulatory Visit: Payer: 59 | Admitting: Family

## 2017-10-13 VITALS — BP 152/86 | HR 83 | Temp 98.6°F | Ht 61.0 in | Wt 264.0 lb

## 2017-10-13 DIAGNOSIS — M79672 Pain in left foot: Secondary | ICD-10-CM

## 2017-10-13 DIAGNOSIS — J309 Allergic rhinitis, unspecified: Secondary | ICD-10-CM

## 2017-10-13 LAB — CBC
HCT: 35 % — ABNORMAL LOW (ref 36.0–46.0)
HEMOGLOBIN: 11.1 g/dL — AB (ref 12.0–15.0)
MCHC: 31.7 g/dL (ref 30.0–36.0)
MCV: 76.1 fl — AB (ref 78.0–100.0)
PLATELETS: 271 10*3/uL (ref 150.0–400.0)
RBC: 4.6 Mil/uL (ref 3.87–5.11)
RDW: 18.1 % — ABNORMAL HIGH (ref 11.5–15.5)
WBC: 6.8 10*3/uL (ref 4.0–10.5)

## 2017-10-13 MED ORDER — FLUTICASONE PROPIONATE 50 MCG/ACT NA SUSP: 2.0000 | Freq: Every day | NASAL | 6 refills | Status: DC

## 2017-10-13 MED ORDER — HYDROCODONE-ACETAMINOPHEN 5-325 MG PO TABS: 1.0000 | ORAL_TABLET | Freq: Four times a day (QID) | ORAL | 0 refills | Status: DC | PRN

## 2017-10-13 MED ORDER — INDOMETHACIN 50 MG PO CAPS: 50.0000 mg | ORAL_CAPSULE | Freq: Three times a day (TID) | ORAL | 0 refills | Status: DC | PRN

## 2017-10-13 NOTE — Patient Instructions (Signed)

## 2017-10-13 NOTE — Progress Notes (Signed)
Lori Mclaughlin is a 57 y.o. female with the following history as recorded in [employer]:  Patient Active Problem List   Diagnosis Date Noted  . 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 Myalgia 03/10/2017  . Screen for colon cancer 06/12/2015  . Visit for screening mammogram 06/12/2015  . Hypersomnia 04/11/2014  . Morbid obesity ([place]) 04/11/2014  . Bradycardia, drug induced 04/11/2014  . Chronic diastolic CHF (congestive heart failure), NYHA class 2 ([place]) 03/30/2014  . Low back pain radiating to left leg 01/26/2012  . Sleep apnea 11/13/2011  . Hyperlipidemia with target LDL less than 70 01/14/2011  . Type II diabetes mellitus with renal manifestations, uncontrolled ([place]) 08/05/2009  . Deficiency anemia 08/05/2009  . Essential hypertension, malignant 08/05/2009  . ALLERGIC RHINITIS 08/05/2009  . ASTHMA 08/05/2009  . GERD 08/05/2009  . OSTEOARTHRITIS 08/05/2009    Current Outpatient Medications  Medication Sig Dispense Refill  . amLODipine (NORVASC) 5 MG tablet Take 1 tablet (5 mg total) by mouth daily. 90 tablet 1  . aspirin EC 81 MG EC tablet Take 1 tablet (81 mg total) by mouth daily.    . chlorthalidone (HYGROTON) 25 MG tablet TAKE ONE TABLET BY MOUTH ONCE DAILY 90 tablet 1  . glucose blood (ONETOUCH VERIO) test strip Use TID 100 each 11  . Insulin Detemir (LEVEMIR FLEXTOUCH) 100 UNIT/ML Pen Inject 70 Units into the skin daily at 10 pm. 45 mL 3  . Insulin Pen Needle (PEN NEEDLES) 32G X 6 MM MISC 1 pen by Does not apply route at bedtime. Use to administer Levemir insulin daily. Dx E11.9 by Does not apply route. Use to administer Levemir insulin daily. Dx E11.9 90 each 3  . nebivolol (BYSTOLIC) 10 MG tablet Take 1 tablet (10 mg total) by mouth daily. 90 tablet 1  . sitaGLIPtin-metformin (JANUMET) 50-1000 MG tablet Take 1 tablet by mouth 2 (two) times daily with a meal. 180 tablet 1  . telmisartan (MICARDIS) 80 MG tablet Take 1 tablet (80 mg total) by mouth daily. 90 tablet 1  .  thiamine (VITAMIN B-1) 100 MG tablet Take 1 tablet (100 mg total) by mouth daily. 90 tablet 1  . fluticasone (FLONASE) 50 MCG/ACT nasal spray Place 2 sprays into both nostrils daily. 16 g 6  . HYDROcodone-acetaminophen (NORCO) 5-325 MG tablet Take 1 tablet by mouth every 6 (six) hours as needed for moderate pain. 20 tablet 0  . indomethacin (INDOCIN) 50 MG capsule Take 1 capsule (50 mg total) by mouth 3 (three) times daily as needed for moderate pain. 90 capsule 0   No current facility-administered medications for this visit.     Allergies: Latex and Lisinopril  Past Medical History:  Diagnosis Date  . Allergic rhinitis   . Asthma   . Atypical chest pain    a. 03/2014: normal nuclear stress test.  . Chronic diastolic CHF (congestive heart failure) ([place])    a. Dx 03/2014 with echo - moderate LVH, EF 44-31%, grade 1 diastolic dysfunction, mildly dilated LA.  . CKD (chronic kidney disease), stage II   . Diabetes mellitus type II   . GERD (gastroesophageal reflux disease)   . Headache(784.0)    when my bp is up  . Hypertension   . Hypertensive heart disease   . Microcytic anemia   . Osteoarthritis     Past Surgical History:  Procedure Laterality Date  . SHOULDER ARTHROSCOPY Bilateral   . TUBAL LIGATION      Family History  Problem Relation Age of  Onset  . Stroke Mother   . Cancer Father   . Hypertension Brother   . Diabetes Other   . Hypertension Other   . Stroke Other   . Cancer Other        colon; prostate  . Kidney disease Neg Hx   . Alcohol abuse Neg Hx   . Asthma Neg Hx   . COPD Neg Hx   . Depression Neg Hx   . Drug abuse Neg Hx   . Early death Neg Hx   . Hearing loss Neg Hx   . Heart disease Neg Hx   . Hyperlipidemia Neg Hx     Social History   Tobacco Use  . Smoking status: Former Smoker    Last attempt to quit: 08/29/2010    Years since quitting: 7.1  . Smokeless tobacco: Never Used  Substance Use Topics  . Alcohol use: No    Alcohol/week: 3.0 oz     Types: 5 Glasses of wine per week    Comment: quit 4 years    Subjective:  Seen with her daughter today; presents with concerns for sudden onset of pain in her left foot x 1 week; notes that foot is swollen and so painful that it hurts for anything to touch it; has no prior history of gout; does take Chlorthalidone daily; no injury or trauma to foot; no recent pork or seafood;  Also complaining of persisting nasal congestion; + runny nose; + sneezing; + cough; denies any fever or shortness of breath; denies any problems breathing;   Objective:  Vitals:   10/13/17 1438  BP: (!) 152/86  Pulse: 83  Temp: 98.6 F (37 C)  TempSrc: Oral  SpO2: 99%  Weight: 264 lb (119.7 kg)  Height: 5' 1" (1.549 m)    General: Well developed, well nourished, in no acute distress  Skin : Warm and dry.  Head: Normocephalic and atraumatic  Eyes: Sclera and conjunctiva clear; pupils round and reactive to light; extraocular movements intact  Ears: External normal; canals clear; tympanic membranes normal  Oropharynx: Pink, supple. No suspicious lesions  Neck: Supple without thyromegaly, adenopathy  Lungs: Respirations unlabored; clear to auscultation bilaterally without wheeze, rales, rhonchi  CVS exam: normal rate and regular rhythm.  Musculoskeletal: No deformities; no active joint inflammation; swelling/ erythema noted left foot base of 1st toe  Extremities: No edema, cyanosis, clubbing  Vessels: Symmetric bilaterally  Neurologic: Alert and oriented; speech intact; face symmetrical; moves all extremities well; CNII-XII intact without focal deficit  Assessment:  1. Left foot pain     Plan:  1. Most likely gout; check uric acid, cmp today; Rx for Indocin ( cost concern with colchicine) tid until flare resolves; Rx for Norco to help with pain; increase water, cherry juice; will most likely need to change chlorthalidone; may also need to start daily allopurinol; follow-up to be determined; work note given as  requested; 2. Trial of Flonase;   No follow-ups on file.  Orders Placed This Encounter  Procedures  . Comp Met (CMET)    Standing Status:   Future    Number of Occurrences:   1    Standing Expiration Date:   10/13/2018  . Uric acid    Standing Status:   Future    Number of Occurrences:   1    Standing Expiration Date:   10/13/2018  . CBC    Standing Status:   Future    Number of Occurrences:   1  Standing Expiration Date:   10/14/2018    Requested Prescriptions   Signed Prescriptions Disp Refills  . fluticasone (FLONASE) 50 MCG/ACT nasal spray 16 g 6    Sig: Place 2 sprays into both nostrils daily.  . indomethacin (INDOCIN) 50 MG capsule 90 capsule 0    Sig: Take 1 capsule (50 mg total) by mouth 3 (three) times daily as needed for moderate pain.  [name] HYDROcodone-acetaminophen (NORCO) 5-325 MG tablet 20 tablet 0    Sig: Take 1 tablet by mouth every 6 (six) hours as needed for moderate pain.

## 2017-10-14 ENCOUNTER — Other Ambulatory Visit: Payer: Self-pay | Admitting: Family

## 2017-10-14 DIAGNOSIS — E790 Hyperuricemia without signs of inflammatory arthritis and tophaceous disease: Secondary | ICD-10-CM

## 2017-10-14 LAB — COMPREHENSIVE METABOLIC PANEL
ALBUMIN: 4 g/dL (ref 3.5–5.2)
ALK PHOS: 90 U/L (ref 39–117)
ALT: 19 U/L (ref 0–35)
AST: 18 U/L (ref 0–37)
BUN: 24 mg/dL — AB (ref 6–23)
CALCIUM: 9.7 mg/dL (ref 8.4–10.5)
CHLORIDE: 102 meq/L (ref 96–112)
CO2: 28 mEq/L (ref 19–32)
CREATININE: 1.33 mg/dL — AB (ref 0.40–1.20)
GFR: 43.48 mL/min — ABNORMAL LOW (ref >60.00)
Glucose, Bld: 254 mg/dL — ABNORMAL HIGH (ref 70–99)
POTASSIUM: 4.4 meq/L (ref 3.5–5.1)
Sodium: 138 mEq/L (ref 135–145)
TOTAL PROTEIN: 8.3 g/dL (ref 6.0–8.3)
Total Bilirubin: 0.2 mg/dL (ref 0.2–1.2)

## 2017-10-14 LAB — URIC ACID: Uric Acid, Serum: 9.1 mg/dL — ABNORMAL HIGH (ref 2.4–7.0)

## 2017-10-19 ENCOUNTER — Ambulatory Visit: Payer: 59 | Admitting: Skilled Nursing Facility1

## 2017-10-19 ENCOUNTER — Encounter: Payer: Self-pay | Admitting: Endocrinology

## 2017-10-19 ENCOUNTER — Ambulatory Visit: Payer: 59 | Admitting: Endocrinology

## 2017-10-19 DIAGNOSIS — E1121 Type 2 diabetes mellitus with diabetic nephropathy: Secondary | ICD-10-CM

## 2017-10-19 DIAGNOSIS — Z794 Long term (current) use of insulin: Secondary | ICD-10-CM | POA: Diagnosis not present

## 2017-10-19 DIAGNOSIS — IMO0002 Reserved for concepts with insufficient information to code with codable children: Secondary | ICD-10-CM

## 2017-10-19 DIAGNOSIS — E1165 Type 2 diabetes mellitus with hyperglycemia: Secondary | ICD-10-CM

## 2017-10-19 MED ORDER — INSULIN DETEMIR 100 UNIT/ML FLEXPEN: 60.0000 [IU] | PEN_INJECTOR | SUBCUTANEOUS | 3 refills | Status: DC

## 2017-10-19 NOTE — Patient Instructions (Addendum)
good diet and exercise significantly improve the control of your diabetes.  please let me know if you wish to be referred to a dietician.  high blood sugar is very risky to your health.  you should see an eye doctor and dentist every year.  It is very important to get all recommended vaccinations.   Controlling your blood pressure and cholesterol drastically reduces the damage diabetes does to your body.  Those who smoke should quit.  Please discuss these with your doctor.   check your blood sugar twice a day.  vary the time of day when you check, between before the 3 meals, and at bedtime.  also check if you have symptoms of your blood sugar being too high or too low.  please keep a record of the readings and bring it to your next appointment here (or you can bring the meter itself).  You can write it on any piece of paper.  please call us sooner if your blood sugar goes below 70, or if you have a lot of readings over 200.   For now, please change the levemir to the morning.  To help you remember, keep it next to something you use each morning.  Please call or message us next week, to tell us how the blood sugar is doing.  Please come back for a follow-up appointment in 6 weeks.

## 2017-10-19 NOTE — Progress Notes (Signed)
Subjective:    Patient ID: Lori Mclaughlin, female    DOB: [birth year], 59 y.o.   MRN: [id number]  HPI pt is referred by Dr Yetta BarreJones, for diabetes.  Pt states DM was dx'ed in 2000 (she had GDM in 1984 and 1987); she has mild if any neuropathy of the lower extremities; she has associated DR and renal insuff; she has been on insulin since 2018; pt says her diet and exercise are both improved recently; she has never had pancreatitis, pancreatic surgery, severe hypoglycemia or DKA.  Pt says she has not recently taken her insulin, due to being too busy.   Past Medical History:  Diagnosis Date  . Allergic rhinitis   . Asthma   . Atypical chest pain    a. 03/2014: normal nuclear stress test.  . Chronic diastolic CHF (congestive heart failure) (HCC)    a. Dx 03/2014 with echo - moderate LVH, EF 55-60%, grade 1 diastolic dysfunction, mildly dilated LA.  . CKD (chronic kidney disease), stage II   . Diabetes mellitus type II   . GERD (gastroesophageal reflux disease)   . Headache(784.0)    when my bp is up  . Hypertension   . Hypertensive heart disease   . Microcytic anemia   . Osteoarthritis     Past Surgical History:  Procedure Laterality Date  . SHOULDER ARTHROSCOPY Bilateral   . TUBAL LIGATION      Social History   Socioeconomic History  . Marital status: Married    Spouse name: Not on file  . Number of children: Not on file  . Years of education: Not on file  . Highest education level: Not on file  Occupational History  . Not on file  Social Needs  . Financial resource strain: Not on file  . Food insecurity:    Worry: Not on file    Inability: Not on file  . Transportation needs:    Medical: Not on file    Non-medical: Not on file  Tobacco Use  . Smoking status: Former Smoker    Last attempt to quit: 08/29/2010    Years since quitting: 7.1  . Smokeless tobacco: Never Used  Substance and Sexual Activity  . Alcohol use: No    Alcohol/week: 3.0 oz    Types: 5 Glasses  of wine per week    Comment: quit 4 years  . Drug use: No  . Sexual activity: Yes    Birth control/protection: Surgical  Lifestyle  . Physical activity:    Days per week: Not on file    Minutes per session: Not on file  . Stress: Not on file  Relationships  . Social connections:    Talks on phone: Not on file    Gets together: Not on file    Attends religious service: Not on file    Active member of club or organization: Not on file    Attends meetings of clubs or organizations: Not on file    Relationship status: Not on file  . Intimate partner violence:    Fear of current or ex partner: Not on file    Emotionally abused: Not on file    Physically abused: Not on file    Forced sexual activity: Not on file  Other Topics Concern  . Not on file  Social History Narrative   Regular Exercise- yes    Current Outpatient Medications on File Prior to Visit  Medication Sig Dispense Refill  . amLODipine (NORVASC) 5 MG tablet  Take 1 tablet (5 mg total) by mouth daily. 90 tablet 1  . aspirin EC 81 MG EC tablet Take 1 tablet (81 mg total) by mouth daily.    . chlorthalidone (HYGROTON) 25 MG tablet TAKE ONE TABLET BY MOUTH ONCE DAILY 90 tablet 1  . fluticasone (FLONASE) 50 MCG/ACT nasal spray Place 2 sprays into both nostrils daily. 16 g 6  . glucose blood (ONETOUCH VERIO) test strip Use TID 100 each 11  . HYDROcodone-acetaminophen (NORCO) 5-325 MG tablet Take 1 tablet by mouth every 6 (six) hours as needed for moderate pain. 20 tablet 0  . indomethacin (INDOCIN) 50 MG capsule Take 1 capsule (50 mg total) by mouth 3 (three) times daily as needed for moderate pain. 90 capsule 0  . Insulin Pen Needle (PEN NEEDLES) 32G X 6 MM MISC 1 pen by Does not apply route at bedtime. Use to administer Levemir insulin daily. Dx E11.9 by Does not apply route. Use to administer Levemir insulin daily. Dx E11.9 90 each 3  . nebivolol (BYSTOLIC) 10 MG tablet Take 1 tablet (10 mg total) by mouth daily. 90 tablet 1   . sitaGLIPtin-metformin (JANUMET) 50-1000 MG tablet Take 1 tablet by mouth 2 (two) times daily with a meal. 180 tablet 1  . telmisartan (MICARDIS) 80 MG tablet Take 1 tablet (80 mg total) by mouth daily. 90 tablet 1  . thiamine (VITAMIN B-1) 100 MG tablet Take 1 tablet (100 mg total) by mouth daily. 90 tablet 1   No current facility-administered medications on file prior to visit.     Allergies  Allergen Reactions  . Latex     REACTION: itching,rash  . Lisinopril     REACTION: headache    Family History  Problem Relation Age of Onset  . Stroke Mother   . Cancer Father   . Hypertension Brother   . Diabetes Other   . Hypertension Other   . Stroke Other   . Cancer Other        colon; prostate  . Kidney disease Neg Hx   . Alcohol abuse Neg Hx   . Asthma Neg Hx   . COPD Neg Hx   . Depression Neg Hx   . Drug abuse Neg Hx   . Early death Neg Hx   . Hearing loss Neg Hx   . Heart disease Neg Hx   . Hyperlipidemia Neg Hx     BP (!) 142/80 (BP Location: Right Wrist, Patient Position: Sitting, Cuff Size: Normal)   Pulse 80   Wt 267 lb (121.1 kg)   LMP 02/23/2012   SpO2 97%   BMI 50.45 kg/m     Review of Systems denies headache, chest pain, sob, n/v, urinary frequency, muscle cramps, excessive diaphoresis, memory loss, depression, cold intolerance, and easy bruising.  She sees opthal for visual loss, rhinorrhea, and weight loss (15 lbs)      Objective:   Physical Exam VS: see vs page GEN: no distress HEAD: head: no deformity eyes: no periorbital swelling, no proptosis external nose and ears are normal mouth: no lesion seen NECK: supple, thyroid is not enlarged CHEST WALL: no deformity LUNGS: clear to auscultation CV: reg rate and rhythm, no murmur ABD: abdomen is soft, nontender.  no hepatosplenomegaly.  not distended.  no hernia MUSCULOSKELETAL: muscle bulk and strength are grossly normal.  no obvious joint swelling.  gait is normal and steady EXTEMITIES: no  deformity.  no ulcer on the feet.  feet are of normal color  and temp.  Trace bilat leg edema, and bilateral onychomycosis of the toenails PULSES: dorsalis pedis intact bilat.  no carotid bruit NEURO:  cn 2-12 grossly intact.   readily moves all 4's.  sensation is intact to touch on the feet.  SKIN:  Normal texture and temperature.  No rash or suspicious lesion is visible.   NODES:  None palpable at the neck.  PSYCH: alert, well-oriented.  Does not appear anxious nor depressed.     Lab Results  Component Value Date   HGBA1C 14.2 (H) 09/14/2017   Lab Results  Component Value Date   CREATININE 1.33 (H) 10/13/2017   BUN 24 (H) 10/13/2017   NA 138 10/13/2017   K 4.4 10/13/2017   CL 102 10/13/2017   CO2 28 10/13/2017   I have reviewed outside records, and summarized: Pt was noted to have severely elevated a1c, and referred here.  She was noted to have chronic noncompliance with medication.   I personally reviewed electrocardiogram tracing (08/24/17): Indication: sob  Impression: NSR.  No MI.  Low voltage Compared to 2017: inverted T-waves are resolved.       Assessment & Plan:  Insulin-requiring type 2 DM, with DR: severe exacerbation. Noncompliance with insulin.  I advised tresiba, for dosing flexibility, but she declines, at least for now.   Obesity, new to me: she declines surgery  Patient Instructions  good diet and exercise significantly improve the control of your diabetes.  please let me know if you wish to be referred to a dietician.  high blood sugar is very risky to your health.  you should see an eye doctor and dentist every year.  It is very important to get all recommended vaccinations.   Controlling your blood pressure and cholesterol drastically reduces the damage diabetes does to your body.  Those who smoke should quit.  Please discuss these with your doctor.   check your blood sugar twice a day.  vary the time of day when you check, between before the 3 meals, and at  bedtime.  also check if you have symptoms of your blood sugar being too high or too low.  please keep a record of the readings and bring it to your next appointment here (or you can bring the meter itself).  You can write it on any piece of paper.  please call us sooner if your blood sugar goes below 70, or if you have a lot of readings over 200.   For now, please change the levemir to the morning.  To help you remember, keep it next to something you use each morning.  Please call or message [place] next week, to tell us how the blood sugar is doing.  Please come back for a follow-up appointment in 6 weeks.

## 2017-10-28 DIAGNOSIS — E113513 Type 2 diabetes mellitus with proliferative diabetic retinopathy with macular edema, bilateral: Secondary | ICD-10-CM | POA: Diagnosis not present

## 2017-10-28 DIAGNOSIS — H3582 Retinal ischemia: Secondary | ICD-10-CM | POA: Diagnosis not present

## 2017-10-28 DIAGNOSIS — H35033 Hypertensive retinopathy, bilateral: Secondary | ICD-10-CM | POA: Diagnosis not present

## 2017-11-30 ENCOUNTER — Ambulatory Visit: Payer: 59 | Admitting: Endocrinology

## 2017-12-16 DIAGNOSIS — E113511 Type 2 diabetes mellitus with proliferative diabetic retinopathy with macular edema, right eye: Secondary | ICD-10-CM | POA: Diagnosis not present

## 2017-12-30 DIAGNOSIS — E113512 Type 2 diabetes mellitus with proliferative diabetic retinopathy with macular edema, left eye: Secondary | ICD-10-CM | POA: Diagnosis not present

## 2017-12-30 DIAGNOSIS — E113513 Type 2 diabetes mellitus with proliferative diabetic retinopathy with macular edema, bilateral: Secondary | ICD-10-CM | POA: Diagnosis not present

## 2017-12-30 DIAGNOSIS — H3582 Retinal ischemia: Secondary | ICD-10-CM | POA: Diagnosis not present

## 2017-12-30 DIAGNOSIS — H35033 Hypertensive retinopathy, bilateral: Secondary | ICD-10-CM | POA: Diagnosis not present

## 2018-01-11 ENCOUNTER — Ambulatory Visit: Payer: 59 | Admitting: Family

## 2018-01-11 ENCOUNTER — Encounter: Payer: Self-pay | Admitting: Family

## 2018-01-11 ENCOUNTER — Telehealth: Payer: Self-pay | Admitting: Internal Medicine

## 2018-01-11 ENCOUNTER — Other Ambulatory Visit (INDEPENDENT_AMBULATORY_CARE_PROVIDER_SITE_OTHER): Payer: 59

## 2018-01-11 ENCOUNTER — Ambulatory Visit
Admission: RE | Admit: 2018-01-11 | Discharge: 2018-01-11 | Disposition: A | Discharge status: Home / Self Care | Payer: 59 | Source: Ambulatory Visit | Attending: Family | Admitting: Family

## 2018-01-11 VITALS — BP 148/86 | HR 87 | Temp 97.9°F | Ht 61.0 in | Wt 266.0 lb

## 2018-01-11 DIAGNOSIS — E1121 Type 2 diabetes mellitus with diabetic nephropathy: Secondary | ICD-10-CM | POA: Diagnosis not present

## 2018-01-11 DIAGNOSIS — IMO0002 Reserved for concepts with insufficient information to code with codable children: Secondary | ICD-10-CM

## 2018-01-11 DIAGNOSIS — G8929 Other chronic pain: Secondary | ICD-10-CM | POA: Diagnosis not present

## 2018-01-11 DIAGNOSIS — E1129 Type 2 diabetes mellitus with other diabetic kidney complication: Secondary | ICD-10-CM | POA: Diagnosis not present

## 2018-01-11 DIAGNOSIS — F419 Anxiety disorder, unspecified: Secondary | ICD-10-CM

## 2018-01-11 DIAGNOSIS — M25511 Pain in right shoulder: Secondary | ICD-10-CM

## 2018-01-11 DIAGNOSIS — M5442 Lumbago with sciatica, left side: Secondary | ICD-10-CM

## 2018-01-11 DIAGNOSIS — E1165 Type 2 diabetes mellitus with hyperglycemia: Secondary | ICD-10-CM

## 2018-01-11 DIAGNOSIS — E790 Hyperuricemia without signs of inflammatory arthritis and tophaceous disease: Secondary | ICD-10-CM

## 2018-01-11 DIAGNOSIS — Z794 Long term (current) use of insulin: Secondary | ICD-10-CM | POA: Diagnosis not present

## 2018-01-11 DIAGNOSIS — I10 Essential (primary) hypertension: Secondary | ICD-10-CM | POA: Diagnosis not present

## 2018-01-11 LAB — COMPREHENSIVE METABOLIC PANEL
ALT: 14 U/L (ref 0–35)
AST: 13 U/L (ref 0–37)
Albumin: 3.7 g/dL (ref 3.5–5.2)
Alkaline Phosphatase: 117 U/L (ref 39–117)
BILIRUBIN TOTAL: 0.3 mg/dL (ref 0.2–1.2)
BUN: 31 mg/dL — ABNORMAL HIGH (ref 6–23)
CALCIUM: 9.2 mg/dL (ref 8.4–10.5)
CO2: 27 meq/L (ref 19–32)
Chloride: 97 mEq/L (ref 96–112)
Creatinine, Ser: 1.58 mg/dL — ABNORMAL HIGH (ref 0.40–1.20)
GFR: 35.61 mL/min — AB (ref >60.00)
GLUCOSE: 500 mg/dL — AB (ref 70–99)
POTASSIUM: 3.9 meq/L (ref 3.5–5.1)
Sodium: 132 mEq/L — ABNORMAL LOW (ref 135–145)
Total Protein: 7.9 g/dL (ref 6.0–8.3)

## 2018-01-11 LAB — HEMOGLOBIN A1C

## 2018-01-11 LAB — URIC ACID: Uric Acid, Serum: 9 mg/dL — ABNORMAL HIGH (ref 2.4–7.0)

## 2018-01-11 MED ORDER — NEBIVOLOL HCL 10 MG PO TABS: 10.0000 mg | ORAL_TABLET | Freq: Every day | ORAL | 1 refills | Status: DC

## 2018-01-11 MED ORDER — CHLORTHALIDONE 25 MG PO TABS: 25.0000 mg | ORAL_TABLET | Freq: Every day | ORAL | 1 refills | Status: DC

## 2018-01-11 MED ORDER — TELMISARTAN 80 MG PO TABS: 80.0000 mg | ORAL_TABLET | Freq: Every day | ORAL | 1 refills | Status: DC

## 2018-01-11 MED ORDER — AMLODIPINE BESYLATE 5 MG PO TABS: 5.0000 mg | ORAL_TABLET | Freq: Every day | ORAL | 1 refills | Status: DC

## 2018-01-11 MED ORDER — SITAGLIPTIN PHOS-METFORMIN HCL 50-1000 MG PO TABS: 1.0000 | ORAL_TABLET | Freq: Two times a day (BID) | ORAL | 1 refills | Status: DC

## 2018-01-11 NOTE — Telephone Encounter (Signed)
Received an call regarding a critical value - glucose of 500.    Reviewed chart.  Her diabetes is uncontrolled.  [name] renewed her janument and advised follow up with Dr Everardo AllEllison.     Called patient - she did not answer - I did leave a voicemail letting her know her sugar was very high. Advised her to call if she is having any concerning symptoms.  Advised following up with Dr Everardo AllEllison and stressed importance of getting sugars better controlled.

## 2018-01-11 NOTE — Progress Notes (Signed)
Lori Mclaughlin is a 57 y.o. female with the following history as recorded in [employer]:  Patient Active Problem List   Diagnosis Date Noted  . 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 Myalgia 03/10/2017  . Screen for colon cancer 06/12/2015  . Visit for screening mammogram 06/12/2015  . Hypersomnia 04/11/2014  . Morbid obesity ([facility]) 04/11/2014  . Bradycardia, drug induced 04/11/2014  . Chronic diastolic CHF (congestive heart failure), NYHA class 2 ([place]) 03/30/2014  . Low back pain radiating to left leg 01/26/2012  . Sleep apnea 11/13/2011  . Hyperlipidemia with target LDL less than 70 01/14/2011  . Type II diabetes mellitus with renal manifestations, uncontrolled ([facility]) 08/05/2009  . Deficiency anemia 08/05/2009  . Essential hypertension, malignant 08/05/2009  . ALLERGIC RHINITIS 08/05/2009  . ASTHMA 08/05/2009  . GERD 08/05/2009  . OSTEOARTHRITIS 08/05/2009    Current Outpatient Medications  Medication Sig Dispense Refill  . amLODipine (NORVASC) 5 MG tablet Take 1 tablet (5 mg total) by mouth daily. 90 tablet 1  . aspirin EC 81 MG EC tablet Take 1 tablet (81 mg total) by mouth daily.    . chlorthalidone (HYGROTON) 25 MG tablet Take 1 tablet (25 mg total) by mouth daily. 90 tablet 1  . fluticasone (FLONASE) 50 MCG/ACT nasal spray Place 2 sprays into both nostrils daily. 16 g 6  . glucose blood (ONETOUCH VERIO) test strip Use TID 100 each 11  . HYDROcodone-acetaminophen (NORCO) 5-325 MG tablet Take 1 tablet by mouth every 6 (six) hours as needed for moderate pain. 20 tablet 0  . indomethacin (INDOCIN) 50 MG capsule Take 1 capsule (50 mg total) by mouth 3 (three) times daily as needed for moderate pain. 90 capsule 0  . Insulin Detemir (LEVEMIR FLEXTOUCH) 100 UNIT/ML Pen Inject 60 Units into the skin every morning. And pen needles 1/day 45 mL 3  . Insulin Pen Needle (PEN NEEDLES) 32G X 6 MM MISC 1 pen by Does not apply route at bedtime. Use to administer Levemir insulin daily. Dx E11.9  by Does not apply route. Use to administer Levemir insulin daily. Dx E11.9 90 each 3  . nebivolol (BYSTOLIC) 10 MG tablet Take 1 tablet (10 mg total) by mouth daily. 90 tablet 1  . sitaGLIPtin-metformin (JANUMET) 50-1000 MG tablet Take 1 tablet by mouth 2 (two) times daily with a meal. 180 tablet 1  . telmisartan (MICARDIS) 80 MG tablet Take 1 tablet (80 mg total) by mouth daily. 90 tablet 1  . thiamine (VITAMIN B-1) 100 MG tablet Take 1 tablet (100 mg total) by mouth daily. 90 tablet 1   No current facility-administered medications for this visit.     Allergies: Latex and Lisinopril  Past Medical History:  Diagnosis Date  . Allergic rhinitis   . Asthma   . Atypical chest pain    a. 03/2014: normal nuclear stress test.  . Chronic diastolic CHF (congestive heart failure) ([place])    a. Dx 03/2014 with echo - moderate LVH, EF 00-45%, grade 1 diastolic dysfunction, mildly dilated LA.  . CKD (chronic kidney disease), stage II   . Diabetes mellitus type II   . GERD (gastroesophageal reflux disease)   . Headache(784.0)    when my bp is up  . Hypertension   . Hypertensive heart disease   . Microcytic anemia   . Osteoarthritis     Past Surgical History:  Procedure Laterality Date  . SHOULDER ARTHROSCOPY Bilateral   . TUBAL LIGATION      Family History  Problem Relation Age of Onset  . Stroke Mother   . Cancer Father   . Hypertension Brother   . Diabetes Other   . Hypertension Other   . Stroke Other   . Cancer Other        colon; prostate  . Kidney disease Neg Hx   . Alcohol abuse Neg Hx   . Asthma Neg Hx   . COPD Neg Hx   . Depression Neg Hx   . Drug abuse Neg Hx   . Early death Neg Hx   . Hearing loss Neg Hx   . Heart disease Neg Hx   . Hyperlipidemia Neg Hx     Social History   Tobacco Use  . Smoking status: Former Smoker    Last attempt to quit: 08/29/2010    Years since quitting: 7.3  . Smokeless tobacco: Never Used  Substance Use Topics  . Alcohol use: No     Alcohol/week: 3.0 oz    Types: 5 Glasses of wine per week    Comment: quit 4 years    Subjective:  Patient presents with numerous concerns:  1) Left sided thigh pain/ localized in back of thigh; worse at night; does have history of sciatica; symptoms present for months; does feel symptoms worsening recently; not taking any thing for symptoms; no known injury or trauma;  2) History of right shoulder arthroscopy; having increased shoulder pain recently; limited range of motion; no known injury; not taking any medication for symptoms;  3) Overdue to see her endocrinologist; would like to get updated refill on her Janumet; does plan to schedule; is taking Levemir in the am; 4) Needs updated prescriptions for her hypertension medications; 5) Did not follow-up on elevated uric acid level from recent flare- agreeable to check today; has not had continued foot pain; 6) Requesting referral to therapist due to multiple personal stressors     Objective:  Vitals:   01/11/18 1514  BP: (!) 148/86  Pulse: 87  Temp: 97.9 F (36.6 C)  TempSrc: Oral  SpO2: 99%  Weight: 266 lb (120.7 kg)  Height: _0  (1.549 m)    General: Well developed, well nourished, in no acute distress  Skin : Warm and dry.  Head: Normocephalic and atraumatic  Lungs: Respirations unlabored; clear to auscultation bilaterally without wheeze, rales, rhonchi  CVS exam: normal rate and regular rhythm.  Musculoskeletal: No deformities; no active joint inflammation; LROM right shoulder; Extremities: No edema, cyanosis, clubbing  Vessels: Symmetric bilaterally  Neurologic: Alert and oriented; speech intact; face symmetrical; moves all extremities well; CNII-XII intact without focal deficit  Assessment:  1. Chronic low back pain with left-sided sciatica, unspecified back pain laterality   2. Right shoulder pain, unspecified chronicity   3. Type II diabetes mellitus with renal manifestations, uncontrolled (HCC)   4. Elevated uric  acid in blood   5. Uncontrolled type 2 diabetes mellitus with diabetic nephropathy, with long-term current use of insulin ([place])   6. Essential hypertension, malignant   7. Anxiety     Plan:  1. Update lumbar x-ray; 2. Update right shoulder x-ray; 3. Refill updated; check Hgba1c; stressed need to see her endocrinologist; 4. Check uric acid today; may need to start allopurinol; 6. Stable; refills updated; 7. Refer to therapist as requested;    No follow-ups on file.  Orders Placed This Encounter  Procedures  . DG Lumbar Spine 2-3 Views    Standing Status:   Future    Number of Occurrences:  1    Standing Expiration Date:   03/14/2019    Order Specific Question:   Reason for Exam (SYMPTOM  OR DIAGNOSIS REQUIRED)    Answer:   low back pain    Order Specific Question:   Is patient pregnant?    Answer:   No    Order Specific Question:   Preferred imaging location?    Answer:   [facility]    Order Specific Question:   Radiology Contrast Protocol - do NOT remove file path    Answer:   \\[email or web address].[email or web address]  . DG Shoulder Right    Standing Status:   Future    Number of Occurrences:   1    Standing Expiration Date:   03/14/2019    Order Specific Question:   Reason for Exam (SYMPTOM  OR DIAGNOSIS REQUIRED)    Answer:   right shoulder pain    Order Specific Question:   Is patient pregnant?    Answer:   No    Order Specific Question:   Preferred imaging location?    Answer:   [facility]    Order Specific Question:   Radiology Contrast Protocol - do NOT remove file path    Answer:   \\[email or web address]s.pdf  . Comp Met (CMET)    Standing Status:   Future    Number of Occurrences:   1    Standing Expiration Date:   01/11/2019  . Uric acid    Standing Status:   Future    Number of Occurrences:   1    Standing Expiration Date:   01/11/2019  . HgB A1c    Standing Status:   Future    Number of Occurrences:    1    Standing Expiration Date:   01/11/2019  . Ambulatory referral to Psychology    Referral Priority:   Routine    Referral Type:   Psychiatric    Referral Reason:   Specialty Services Required    Requested Specialty:   Psychology    Number of Visits Requested:   1    Requested Prescriptions   Signed Prescriptions Disp Refills  . amLODipine (NORVASC) 5 MG tablet 90 tablet 1    Sig: Take 1 tablet (5 mg total) by mouth daily.  . chlorthalidone (HYGROTON) 25 MG tablet 90 tablet 1    Sig: Take 1 tablet (25 mg total) by mouth daily.  . sitaGLIPtin-metformin (JANUMET) 50-1000 MG tablet 180 tablet 1    Sig: Take 1 tablet by mouth 2 (two) times daily with a meal.  . telmisartan (MICARDIS) 80 MG tablet 90 tablet 1    Sig: Take 1 tablet (80 mg total) by mouth daily.  . nebivolol (BYSTOLIC) 10 MG tablet 90 tablet 1    Sig: Take 1 tablet (10 mg total) by mouth daily.

## 2018-01-12 ENCOUNTER — Telehealth: Payer: Self-pay | Admitting: Endocrinology

## 2018-01-12 ENCOUNTER — Other Ambulatory Visit: Payer: Self-pay | Admitting: Family

## 2018-01-12 MED ORDER — ALLOPURINOL 100 MG PO TABS: 100.0000 mg | ORAL_TABLET | Freq: Every day | ORAL | 1 refills | Status: DC

## 2018-01-12 NOTE — Telephone Encounter (Signed)
4 PM, 01/18/18

## 2018-01-12 NOTE — Telephone Encounter (Signed)
Please advise on an opening?

## 2018-01-12 NOTE — Telephone Encounter (Signed)
I have spoke with patient & she can come in for appointment. I have asked Judeth CornfieldStephanie to approve adding patient.

## 2018-01-12 NOTE — Telephone Encounter (Signed)
Patient stated that she went to her PCP  And they stated her blood sugars are over 500 and needs to follow up with Dr Everardo AllEllison. He has not openings until at least aug. Is there something she can do or can we get her in sooner?  thanks

## 2018-01-13 ENCOUNTER — Ambulatory Visit: Payer: Self-pay | Admitting: *Deleted

## 2018-01-13 ENCOUNTER — Emergency Department (HOSPITAL_COMMUNITY)
Admission: EM | Admit: 2018-01-13 | Discharge: 2018-01-13 | Disposition: A | Discharge status: Home / Self Care | Payer: 59 | Attending: Emergency Medicine | Admitting: Emergency Medicine

## 2018-01-13 ENCOUNTER — Encounter (HOSPITAL_COMMUNITY): Payer: Self-pay | Admitting: Emergency Medicine

## 2018-01-13 DIAGNOSIS — I10 Essential (primary) hypertension: Secondary | ICD-10-CM

## 2018-01-13 DIAGNOSIS — Z794 Long term (current) use of insulin: Secondary | ICD-10-CM | POA: Diagnosis not present

## 2018-01-13 DIAGNOSIS — I5032 Chronic diastolic (congestive) heart failure: Secondary | ICD-10-CM | POA: Insufficient documentation

## 2018-01-13 DIAGNOSIS — Z91040 Latex allergy status: Secondary | ICD-10-CM | POA: Insufficient documentation

## 2018-01-13 DIAGNOSIS — R739 Hyperglycemia, unspecified: Secondary | ICD-10-CM

## 2018-01-13 DIAGNOSIS — Z87891 Personal history of nicotine dependence: Secondary | ICD-10-CM | POA: Diagnosis not present

## 2018-01-13 DIAGNOSIS — E1122 Type 2 diabetes mellitus with diabetic chronic kidney disease: Secondary | ICD-10-CM | POA: Insufficient documentation

## 2018-01-13 DIAGNOSIS — E1165 Type 2 diabetes mellitus with hyperglycemia: Secondary | ICD-10-CM | POA: Insufficient documentation

## 2018-01-13 DIAGNOSIS — N182 Chronic kidney disease, stage 2 (mild): Secondary | ICD-10-CM | POA: Insufficient documentation

## 2018-01-13 DIAGNOSIS — J45909 Unspecified asthma, uncomplicated: Secondary | ICD-10-CM | POA: Diagnosis not present

## 2018-01-13 DIAGNOSIS — Z79899 Other long term (current) drug therapy: Secondary | ICD-10-CM | POA: Diagnosis not present

## 2018-01-13 DIAGNOSIS — I130 Hypertensive heart and chronic kidney disease with heart failure and stage 1 through stage 4 chronic kidney disease, or unspecified chronic kidney disease: Secondary | ICD-10-CM | POA: Diagnosis not present

## 2018-01-13 LAB — CBC WITH DIFFERENTIAL/PLATELET
BASOS PCT: 0 %
Basophils Absolute: 0 10*3/uL (ref 0.0–0.1)
Eosinophils Absolute: 0.4 10*3/uL (ref 0.0–0.7)
Eosinophils Relative: 5 %
HEMATOCRIT: 39.1 % (ref 36.0–46.0)
Hemoglobin: 12.4 g/dL (ref 12.0–15.0)
Lymphocytes Relative: 26 %
Lymphs Abs: 2.3 10*3/uL (ref 0.7–4.0)
MCH: 23.9 pg — ABNORMAL LOW (ref 26.0–34.0)
MCHC: 31.7 g/dL (ref 30.0–36.0)
MCV: 75.5 fL — ABNORMAL LOW (ref 78.0–100.0)
MONO ABS: 0.6 10*3/uL (ref 0.1–1.0)
MONOS PCT: 7 %
Neutro Abs: 5.3 10*3/uL (ref 1.7–7.7)
Neutrophils Relative %: 62 %
Platelets: 259 10*3/uL (ref 150–400)
RBC: 5.18 MIL/uL — ABNORMAL HIGH (ref 3.87–5.11)
RDW: 14.6 % (ref 11.5–15.5)
WBC: 8.5 10*3/uL (ref 4.0–10.5)

## 2018-01-13 LAB — BASIC METABOLIC PANEL
Anion gap: 9 (ref 5–15)
BUN: 32 mg/dL — ABNORMAL HIGH (ref 6–20)
CALCIUM: 9.2 mg/dL (ref 8.9–10.3)
CO2: 27 mmol/L (ref 22–32)
CREATININE: 1.56 mg/dL — AB (ref 0.44–1.00)
Chloride: 103 mmol/L (ref 101–111)
GFR calc Af Amer: 41 mL/min — ABNORMAL LOW (ref >60)
GFR calc non Af Amer: 36 mL/min — ABNORMAL LOW (ref >60)
Glucose, Bld: 299 mg/dL — ABNORMAL HIGH (ref 65–99)
Potassium: 4.7 mmol/L (ref 3.5–5.1)
Sodium: 139 mmol/L (ref 135–145)

## 2018-01-13 LAB — CBG MONITORING, ED: Glucose-Capillary: 281 mg/dL — ABNORMAL HIGH (ref 65–99)

## 2018-01-13 MED ORDER — SODIUM CHLORIDE 0.9 % IV BOLUS
1000.0000 mL | Freq: Once | INTRAVENOUS | Status: AC
  Administered 2018-01-13: 1000 mL via INTRAVENOUS

## 2018-01-13 MED ORDER — HYDRALAZINE HCL 50 MG PO TABS
50.0000 mg | ORAL_TABLET | Freq: Once | ORAL | Status: AC
  Administered 2018-01-13: 50 mg via ORAL
  Filled 2018-01-13: qty 1

## 2018-01-13 MED ORDER — ACETAMINOPHEN 500 MG PO TABS
500.0000 mg | ORAL_TABLET | Freq: Once | ORAL | Status: AC
  Administered 2018-01-13: 500 mg via ORAL
  Filled 2018-01-13: qty 1

## 2018-01-13 NOTE — ED Triage Notes (Signed)
Patient here from home with complaints of hyperglycemia. MD out of town, unable to see today. CBG 497 at home, out of lancets.

## 2018-01-13 NOTE — Discharge Instructions (Signed)
Both your blood sugar and your blood pressure were elevated today.  It has come down significantly however you will need to follow-up closely with your doctor to have medication management of your blood pressure as well as your blood sugar.  Please avoid fast food, avoid foods high in carbohydrates or sugar, take your medications exactly as prescribed, you may take 60 units of Levemir instead of 50 in the morning but watch for low blood sugars less than 100.  If this occurs eat a meal and reduce her dose to 50.

## 2018-01-13 NOTE — Telephone Encounter (Signed)
Patient is calling to report that her glucose level fasting this morning was 497. She can not check it because she is out of lancets. Patient states she is having dizziness and unsteadiness, headache, and chest heaviness. She has contacted her endocrine office- but her provider is out of town and they can not help her. Call to office to see if they want to see her or have her go to hospital. Per office- with the symptoms she is having- she will need to go to the hospital- she may need IV treatment. Patient notified and she is going to call her daughter to take her. Reason for Disposition . Blood glucose > 400 mg/dl (22 mmol/l)  Answer Assessment - Initial Assessment Questions 1. SYMPTOMS: "What symptoms are you concerned about?"     Headache, sweats, unsteady, chest heaviness 2. ONSET:  "When did the symptoms start?"     Sunday 3. BLOOD GLUCOSE: "What is your blood glucose level?"      497 this morning 4. USUAL RANGE: "What is your blood glucose level usually?" (e.g., usual fasting morning value, usual evening value)     Not often 5. TYPE 1 or 2:  "Do you know what type of diabetes you have?"  (e.g., Type 1, Type 2, Gestational; doesn\'t know)      Type 2 6. INSULIN: "Do you take insulin?"      Yes- Levimir 7. DIABETES PILLS: "Do you take any pills for your diabetes?"     janumet  8. OTHER SYMPTOMS: "Do you have any symptoms?" (e.g., fever, frequent urination, difficulty breathing, vomiting)     No- some nausea 9. LOW BLOOD GLUCOSE TREATMENT: "What have you done so far to treat the high blood glucose level?"     Increased to 60 unit twice yesterday 10. ALONE: "Are you alone right now or is someone with you?"        Patient is at work now 11. PREGNANCY: "Is there any chance you are pregnant?" "When was your last menstrual period?"       n/a  Answer Assessment - Initial Assessment Questions 1. BLOOD GLUCOSE: "What is your blood glucose level?"      497 this morning 2. ONSET: "When did you  check the blood glucose?"     This morning 3. USUAL RANGE: "What is your glucose level usually?" (e.g., usual fasting morning value, usual evening value)     18 0-200- before Tuesday patient had not checked glucose for 2 weeks 4. KETONES: "Do you check for ketones (urine or blood test strips)?" If yes, ask: "What does the test show now?"      no 5. TYPE 1 or 2:  "Do you know what type of diabetes you have?"  (e.g., Type 1, Type 2, Gestational; doesn't know)      Type 1 6. INSULIN: "Do you take insulin?" If yes, ask: "Have you missed any shots recently?"     Yes- no 7. DIABETES PILLS: "Do you take any pills for your diabetes?" If yes, ask: "Have you missed taking any pills recently?"     Yes- no 8. OTHER SYMPTOMS: "Do you have any symptoms?" (e.g., fever, frequent urination, difficulty breathing, dizziness, weakness, vomiting)     Headache,dizziness 9. PREGNANCY: "Is there any chance you are pregnant?" "When was your last menstrual period?"     n/a  Protocols used: DIABETES - HIGH BLOOD SUGAR-A-AH, DIABETES - LOW BLOOD SUGAR-A-AH

## 2018-01-13 NOTE — ED Notes (Signed)
ED Provider at bedside. 

## 2018-01-13 NOTE — ED Provider Notes (Signed)
[facility]-EMERGENCY DEPT Provider Note   CSN: [id number] Arrival date & time: 01/13/18  1558     History   Chief Complaint Chief Complaint  Patient presents with  . Hyperglycemia    HPI Lori Mclaughlin is a 57 y.o. female.  HPI  57 year old female, she has a known history of hypertension and diabetes both of which are treated.  She follows with Dr. Everardo All with the endocrinology service as well.  She has had diabetes for many years, it is generally been poorly controlled based on A1c levels which have been elevated for the last 2 years based on my medical record review.  Most recently was measured around 15, these labs were taken within the last week.  She was also noted to be persistently hyperglycemic.  She has had frequent thirst, she has had some weight loss though she states she is trying to lose weight however she states that she has also been decreased on her Levemir recently stating that she was not feeling well when she woke up in the morning and thus cut back from 60 to 50 units at night.  She was also asked to start taking this in the morning which she has.  She reports that she took 60 units today instead of 50 because her blood sugars were going up and she is not going to see the endocrinologist for approximately 1 week.  She denies chest pain abdominal pain nausea vomiting or diarrhea and denies any other symptoms.  When she was seen at the office today she was noted also have hypertension, sent to the emergency department for evaluation and stabilization.  Past Medical History:  Diagnosis Date  . Allergic rhinitis   . Asthma   . Atypical chest pain    a. 03/2014: normal nuclear stress test.  . Chronic diastolic CHF (congestive heart failure) (HCC)    a. Dx 03/2014 with echo - moderate LVH, EF 55-60%, grade 1 diastolic dysfunction, mildly dilated LA.  . CKD (chronic kidney disease), stage II   . Diabetes mellitus type II   . GERD  (gastroesophageal reflux disease)   . Headache(784.0)    when my bp is up  . Hypertension   . Hypertensive heart disease   . Microcytic anemia   . Osteoarthritis     Patient Active Problem List   Diagnosis Date Noted  . 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 Myalgia 03/10/2017  . Screen for colon cancer 06/12/2015  . Visit for screening mammogram 06/12/2015  . Hypersomnia 04/11/2014  . Morbid obesity (HCC) 04/11/2014  . Bradycardia, drug induced 04/11/2014  . Chronic diastolic CHF (congestive heart failure), NYHA class 2 (HCC) 03/30/2014  . Low back pain radiating to left leg 01/26/2012  . Sleep apnea 11/13/2011  . Hyperlipidemia with target LDL less than 70 01/14/2011  . Type II diabetes mellitus with renal manifestations, uncontrolled (HCC) 08/05/2009  . Deficiency anemia 08/05/2009  . Essential hypertension, malignant 08/05/2009  . ALLERGIC RHINITIS 08/05/2009  . ASTHMA 08/05/2009  . GERD 08/05/2009  . OSTEOARTHRITIS 08/05/2009    Past Surgical History:  Procedure Laterality Date  . SHOULDER ARTHROSCOPY Bilateral   . TUBAL LIGATION       OB History   None      Home Medications    Prior to Admission medications   Medication Sig Start Date End Date Taking? Authorizing Provider  amLODipine (NORVASC) 5 MG tablet Take 1 tablet (5 mg total) by mouth daily. 01/11/18  Yes Ria Clock  Margarita GrizzleWoodruff, FNP  chlorthalidone (HYGROTON) 25 MG tablet Take 1 tablet (25 mg total) by mouth daily. 01/11/18  Yes Olive BassMurray, Laura Woodruff, FNP  Insulin Detemir (LEVEMIR FLEXTOUCH) 100 UNIT/ML Pen Inject 60 Units into the skin every morning. And pen needles 1/day 10/19/17  Yes Romero BellingEllison, Sean, MD  sitaGLIPtin-metformin [facility](JANUMET) 50-1000 MG tablet Take 1 tablet by mouth 2 (two) times daily with a meal. 01/11/18  Yes Olive BassMurray, Laura Woodruff, FNP  telmisartan (MICARDIS) 80 MG tablet Take 1 tablet (80 mg total) by mouth daily. 01/11/18  Yes Olive BassMurray, Laura Woodruff, FNP  thiamine (VITAMIN B-1) 100 MG tablet  Take 1 tablet (100 mg total) by mouth daily. 09/19/17  Yes Etta GrandchildJones, Thomas L, MD  allopurinol (ZYLOPRIM) 100 MG tablet Take 1 tablet (100 mg total) by mouth daily. 01/12/18   Olive BassMurray, Laura Woodruff, FNP  aspirin EC 81 MG EC tablet Take 1 tablet (81 mg total) by mouth daily. Patient not taking: Reported on 01/13/2018 04/01/14   Laurann Montanaunn, Dayna N, PA-C  fluticasone (FLONASE) 50 MCG/ACT nasal spray Place 2 sprays into both nostrils daily. Patient taking differently: Place 2 sprays into both nostrils daily as needed for allergies.  10/13/17   Olive BassMurray, Laura Woodruff, FNP  glucose blood [facility](ONETOUCH VERIO) test strip Use TID 04/28/16   Etta GrandchildJones, Thomas L, MD  HYDROcodone-acetaminophen (NORCO) 5-325 MG tablet Take 1 tablet by mouth every 6 (six) hours as needed for moderate pain. 10/13/17   Olive BassMurray, Laura Woodruff, FNP  indomethacin (INDOCIN) 50 MG capsule Take 1 capsule (50 mg total) by mouth 3 (three) times daily as needed for moderate pain. 10/13/17   Olive BassMurray, Laura Woodruff, FNP  Insulin Pen Needle (PEN NEEDLES) 32G X 6 MM MISC 1 pen by Does not apply route at bedtime. Use to administer Levemir insulin daily. Dx E11.9 by Does not apply route. Use to administer Levemir insulin daily. Dx E11.9 05/05/16   Etta GrandchildJones, Thomas L, MD  nebivolol (BYSTOLIC) 10 MG tablet Take 1 tablet (10 mg total) by mouth daily. 01/11/18   Olive BassMurray, Laura Woodruff, FNP    Family History Family History  Problem Relation Age of Onset  . Stroke Mother   . Cancer Father   . Hypertension Brother   . Diabetes Other   . Hypertension Other   . Stroke Other   . Cancer Other        colon; prostate  . Kidney disease Neg Hx   . Alcohol abuse Neg Hx   . Asthma Neg Hx   . COPD Neg Hx   . Depression Neg Hx   . Drug abuse Neg Hx   . Early death Neg Hx   . Hearing loss Neg Hx   . Heart disease Neg Hx   . Hyperlipidemia Neg Hx     Social History Social History   Tobacco Use  . Smoking status: Former Smoker    Last attempt to quit: 08/29/2010    Years  since quitting: 7.3  . Smokeless tobacco: Never Used  Substance Use Topics  . Alcohol use: No    Alcohol/week: 3.0 oz    Types: 5 Glasses of wine per week    Comment: quit 4 years  . Drug use: No     Allergies   Latex and Lisinopril   Review of Systems Review of Systems  All other systems reviewed and are negative.    Physical Exam Updated Vital Signs BP (!) 195/89   Pulse 87   Temp 98 F (36.7 C) (Oral)  Resp 20   LMP 02/23/2012   SpO2 98%   Physical Exam  Constitutional: She appears well-developed and well-nourished. No distress.  HENT:  Head: Normocephalic and atraumatic.  Mouth/Throat: Oropharynx is clear and moist. No oropharyngeal exudate.  Eyes: Pupils are equal, round, and reactive to light. Conjunctivae and EOM are normal. Right eye exhibits no discharge. Left eye exhibits no discharge. No scleral icterus.  Neck: Normal range of motion. Neck supple. No JVD present. No thyromegaly present.  Cardiovascular: Normal rate, regular rhythm, normal heart sounds and intact distal pulses. Exam reveals no gallop and no friction rub.  No murmur heard. Pulmonary/Chest: Effort normal and breath sounds normal. No respiratory distress. She has no wheezes. She has no rales.  Abdominal: Soft. Bowel sounds are normal. She exhibits no distension and no mass. There is no tenderness.  Musculoskeletal: Normal range of motion. She exhibits no edema or tenderness.  Lymphadenopathy:    She has no cervical adenopathy.  Neurological: She is alert. Coordination normal.  Skin: Skin is warm and dry. No rash noted. No erythema.  Psychiatric: She has a normal mood and affect. Her behavior is normal.  Nursing note and vitals reviewed.    ED Treatments / Results  Labs (all labs ordered are listed, but only abnormal results are displayed) Labs Reviewed  BASIC METABOLIC PANEL - Abnormal; Notable for the following components:      Result Value   Glucose, Bld 299 (*)    BUN 32 (*)     Creatinine, Ser 1.56 (*)    GFR calc non Af Amer 36 (*)    GFR calc Af Amer 41 (*)    All other components within normal limits  CBC WITH DIFFERENTIAL/PLATELET - Abnormal; Notable for the following components:   RBC 5.18 (*)    MCV 75.5 (*)    MCH 23.9 (*)    All other components within normal limits  CBG MONITORING, ED - Abnormal; Notable for the following components:   Glucose-Capillary 281 (*)    All other components within normal limits  URINALYSIS, ROUTINE W REFLEX MICROSCOPIC    EKG None  Radiology No results found.  Procedures Procedures (including critical care time)  Medications Ordered in ED Medications  sodium chloride 0.9 % bolus 1,000 mL (0 mLs Intravenous Stopped 01/13/18 1845)  hydrALAZINE (APRESOLINE) tablet 50 mg (50 mg Oral Given 01/13/18 1753)  acetaminophen (TYLENOL) tablet 500 mg (500 mg Oral Given 01/13/18 1848)     Initial Impression / Assessment and Plan / ED Course  I have reviewed the triage vital signs and the nursing notes.  Pertinent labs & imaging results that were available during my care of the patient were reviewed by me and considered in my medical decision making (see chart for details).     At this time the patient is well-appearing, she has a blood sugar that is less than 300, her vital signs are significant only for her severe hypertension which may be in a variant measurement.  We will recheck, get some fluids, check labs, the patient is otherwise stable appearing without any chest pain, any signs of infection, she does state that she eats at fast food frequently which is why I suspect her hemoglobin A1c has been persistently elevated.  BMP normal without findings of DKA - there is elevated Cr but similar to prior. CBC without acute findings.  Htn is better with hydralazine - she has f/u with PCP - will have her watch her diet - has f/u  with Endrocine - pt agreeable to the plan.  Given IVF and finished bag - feeling stable.  Final  Clinical Impressions(s) / ED Diagnoses   Final diagnoses:  Hyperglycemia  Essential hypertension    ED Discharge Orders    None       Eber Hong, MD 01/13/18 1946

## 2018-01-18 ENCOUNTER — Encounter: Payer: Self-pay | Admitting: Endocrinology

## 2018-01-18 ENCOUNTER — Encounter: Payer: Self-pay | Admitting: Family

## 2018-01-18 ENCOUNTER — Ambulatory Visit: Payer: 59 | Admitting: Family

## 2018-01-18 ENCOUNTER — Ambulatory Visit: Payer: 59 | Admitting: Endocrinology

## 2018-01-18 VITALS — BP 165/100 | HR 65 | Temp 98.2°F | Ht 61.0 in | Wt 270.0 lb

## 2018-01-18 DIAGNOSIS — E1121 Type 2 diabetes mellitus with diabetic nephropathy: Secondary | ICD-10-CM

## 2018-01-18 DIAGNOSIS — IMO0002 Reserved for concepts with insufficient information to code with codable children: Secondary | ICD-10-CM

## 2018-01-18 DIAGNOSIS — Z794 Long term (current) use of insulin: Secondary | ICD-10-CM

## 2018-01-18 DIAGNOSIS — E1165 Type 2 diabetes mellitus with hyperglycemia: Secondary | ICD-10-CM

## 2018-01-18 DIAGNOSIS — I10 Essential (primary) hypertension: Secondary | ICD-10-CM | POA: Diagnosis not present

## 2018-01-18 MED ORDER — INSULIN DETEMIR 100 UNIT/ML FLEXPEN: 80.0000 [IU] | PEN_INJECTOR | SUBCUTANEOUS | 3 refills | Status: DC

## 2018-01-18 NOTE — Progress Notes (Signed)
Lori Mclaughlin is a 57 y.o. female with the following history as recorded in [employer]:  Patient Active Problem List   Diagnosis Date Noted  . 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 Myalgia 03/10/2017  . Screen for colon cancer 06/12/2015  . Visit for screening mammogram 06/12/2015  . Hypersomnia 04/11/2014  . Morbid obesity (HCC) 04/11/2014  . Bradycardia, drug induced 04/11/2014  . Chronic diastolic CHF (congestive heart failure), NYHA class 2 (HCC) 03/30/2014  . Low back pain radiating to left leg 01/26/2012  . Sleep apnea 11/13/2011  . Hyperlipidemia with target LDL less than 70 01/14/2011  . Type II diabetes mellitus with renal manifestations, uncontrolled (HCC) 08/05/2009  . Deficiency anemia 08/05/2009  . Essential hypertension, malignant 08/05/2009  . ALLERGIC RHINITIS 08/05/2009  . ASTHMA 08/05/2009  . GERD 08/05/2009  . OSTEOARTHRITIS 08/05/2009    Current Outpatient Medications  Medication Sig Dispense Refill  . allopurinol (ZYLOPRIM) 100 MG tablet Take 1 tablet (100 mg total) by mouth daily. 30 tablet 1  . amLODipine (NORVASC) 5 MG tablet Take 1 tablet (5 mg total) by mouth daily. 90 tablet 1  . aspirin EC 81 MG EC tablet Take 1 tablet (81 mg total) by mouth daily.    . chlorthalidone (HYGROTON) 25 MG tablet Take 1 tablet (25 mg total) by mouth daily. 90 tablet 1  . fluticasone (FLONASE) 50 MCG/ACT nasal spray Place 2 sprays into both nostrils daily. (Patient taking differently: Place 2 sprays into both nostrils daily as needed for allergies. ) 16 g 6  . glucose blood (ONETOUCH VERIO) test strip Use TID 100 each 11  . HYDROcodone-acetaminophen (NORCO) 5-325 MG tablet Take 1 tablet by mouth every 6 (six) hours as needed for moderate pain. 20 tablet 0  . indomethacin (INDOCIN) 50 MG capsule Take 1 capsule (50 mg total) by mouth 3 (three) times daily as needed for moderate pain. 90 capsule 0  . Insulin Detemir (LEVEMIR FLEXTOUCH) 100 UNIT/ML Pen Inject 60 Units into the  skin every morning. And pen needles 1/day 45 mL 3  . Insulin Pen Needle (PEN NEEDLES) 32G X 6 MM MISC 1 pen by Does not apply route at bedtime. Use to administer Levemir insulin daily. Dx E11.9 by Does not apply route. Use to administer Levemir insulin daily. Dx E11.9 90 each 3  . nebivolol (BYSTOLIC) 10 MG tablet Take 1 tablet (10 mg total) by mouth daily. 90 tablet 1  . sitaGLIPtin-metformin (JANUMET) 50-1000 MG tablet Take 1 tablet by mouth 2 (two) times daily with a meal. 180 tablet 1  . telmisartan (MICARDIS) 80 MG tablet Take 1 tablet (80 mg total) by mouth daily. 90 tablet 1  . thiamine (VITAMIN B-1) 100 MG tablet Take 1 tablet (100 mg total) by mouth daily. 90 tablet 1   No current facility-administered medications for this visit.     Allergies: Latex and Lisinopril  Past Medical History:  Diagnosis Date  . Allergic rhinitis   . Asthma   . Atypical chest pain    a. 03/2014: normal nuclear stress test.  . Chronic diastolic CHF (congestive heart failure) (HCC)    a. Dx 03/2014 with echo - moderate LVH, EF 55-60%, grade 1 diastolic dysfunction, mildly dilated LA.  . CKD (chronic kidney disease), stage II   . Diabetes mellitus type II   . GERD (gastroesophageal reflux disease)   . Headache(784.0)    when my bp is up  . Hypertension   . Hypertensive heart disease   . Microcytic  anemia   . Osteoarthritis     Past Surgical History:  Procedure Laterality Date  . SHOULDER ARTHROSCOPY Bilateral   . TUBAL LIGATION      Family History  Problem Relation Age of Onset  . Stroke Mother   . Cancer Father   . Hypertension Brother   . Diabetes Other   . Hypertension Other   . Stroke Other   . Cancer Other        colon; prostate  . Kidney disease Neg Hx   . Alcohol abuse Neg Hx   . Asthma Neg Hx   . COPD Neg Hx   . Depression Neg Hx   . Drug abuse Neg Hx   . Early death Neg Hx   . Hearing loss Neg Hx   . Heart disease Neg Hx   . Hyperlipidemia Neg Hx     Social History    Tobacco Use  . Smoking status: Former Smoker    Last attempt to quit: 08/29/2010    Years since quitting: 7.3  . Smokeless tobacco: Never Used  Substance Use Topics  . Alcohol use: No    Alcohol/week: 3.0 oz    Types: 5 Glasses of wine per week    Comment: quit 4 years    Subjective:  Patient presents to follow-up on recent ER visit due to uncontrolled hypertension and uncontrolled blood sugar; at ER was given 2 bags of fluid/ found to be very dehydrated; of note, she is not on her blood pressure medications today- she typically takes all 4 of her blood pressure medications at lunch-time; Denies any chest pain, shortness of breath, blurred vision or headache today;    Objective:  Vitals:   01/18/18 1012  BP: (!) 165/100  Pulse: 65  Temp: 98.2 F (36.8 C)  TempSrc: Oral  SpO2: 98%  Weight: 270 lb 0.6 oz (122.5 kg)  Height: 5\' 1"  (1.549 m)    General: Well developed, well nourished, in no acute distress  Skin : Warm and dry.  Head: Normocephalic and atraumatic  Lungs: Respirations unlabored; clear to auscultation bilaterally without wheeze, rales, rhonchi  CVS exam: normal rate and regular rhythm.  Neurologic: Alert and oriented; speech intact; face symmetrical; moves all extremities well; CNII-XII intact without focal deficit   Assessment:  1. Essential hypertension, malignant     Plan:  ? Control; increase Amlodipine 10 mg; continue Chlorthalidone and Micardis at 1 pm; try taking her Bystolic at night; follow-up for nurse check on blood pressure in 1 week; Stressed necessity of getting her diabetes under control- she is scheduled to see her endocrinologist today.   Return for Friday, July 5 for nurse visit for BP check.  No orders of the defined types were placed in this encounter.   Requested Prescriptions    No prescriptions requested or ordered in this encounter

## 2018-01-18 NOTE — Patient Instructions (Addendum)
check your blood sugar twice a day.  vary the time of day when you check, between before the 3 meals, and at bedtime.  also check if you have symptoms of your blood sugar being too high or too low.  please keep a record of the readings and bring it to your next appointment here (or you can bring the meter itself).  You can write it on any piece of paper.  please call us sooner if your blood sugar goes below 70, or if you have a lot of readings over 200.   please increase the levemir to 80 units each morning, and: Please continue the same janumet.   Please call or message us next week, to tell us how the blood sugar is doing.  Please come back for a follow-up appointment in 2 months.

## 2018-01-18 NOTE — Patient Instructions (Signed)
Increase the Amlodipine to 10 mg ( 2 at the same time);  Lunch-time regimen would be Amlodipine 10 mg, Chlorthalidone 25 mg and Micardis 80 mg With your night-time Janumet, take Bystolic 10 mg;

## 2018-01-18 NOTE — Progress Notes (Signed)
Subjective:    Patient ID: Lori Mclaughlin, female    DOB: 04-Oct-1960, 57 y.o.   MRN: [id number]  HPI Pt returns for f/u of diabetes mellitus: DM type: Insulin-requiring type 2.   Dx'ed: 2000 Complications: DR and renal insuff  Therapy: insulin since 2018 GDM: 1984 and 1987 DKA: never Severe hypoglycemia: never Pancreatitis: never Pancreatic imaging: never Other: she takes qd insulin, after poor results with multiple daily injections. Interval history: Pt says she never misses the insulin.  pt states she feels well in general.  no cbg record, but states cbg's vary from 168-500.  There is no trend throughout the day. Past Medical History:  Diagnosis Date  . Allergic rhinitis   . Asthma   . Atypical chest pain    a. 03/2014: normal nuclear stress test.  . Chronic diastolic CHF (congestive heart failure) (HCC)    a. Dx 03/2014 with echo - moderate LVH, EF 55-60%, grade 1 diastolic dysfunction, mildly dilated LA.  . CKD (chronic kidney disease), stage II   . Diabetes mellitus type II   . GERD (gastroesophageal reflux disease)   . Headache(784.0)    when my bp is up  . Hypertension   . Hypertensive heart disease   . Microcytic anemia   . Osteoarthritis     Past Surgical History:  Procedure Laterality Date  . SHOULDER ARTHROSCOPY Bilateral   . TUBAL LIGATION      Social History   Socioeconomic History  . Marital status: Married    Spouse name: Not on file  . Number of children: Not on file  . Years of education: Not on file  . Highest education level: Not on file  Occupational History  . Not on file  Social Needs  . Financial resource strain: Not on file  . Food insecurity:    Worry: Not on file    Inability: Not on file  . Transportation needs:    Medical: Not on file    Non-medical: Not on file  Tobacco Use  . Smoking status: Former Smoker    Last attempt to quit: 08/29/2010    Years since quitting: 7.4  . Smokeless tobacco: Never Used  Substance and  Sexual Activity  . Alcohol use: No    Alcohol/week: 3.0 oz    Types: 5 Glasses of wine per week    Comment: quit 4 years  . Drug use: No  . Sexual activity: Yes    Birth control/protection: Surgical  Lifestyle  . Physical activity:    Days per week: Not on file    Minutes per session: Not on file  . Stress: Not on file  Relationships  . Social connections:    Talks on phone: Not on file    Gets together: Not on file    Attends religious service: Not on file    Active member of club or organization: Not on file    Attends meetings of clubs or organizations: Not on file    Relationship status: Not on file  . Intimate partner violence:    Fear of current or ex partner: Not on file    Emotionally abused: Not on file    Physically abused: Not on file    Forced sexual activity: Not on file  Other Topics Concern  . Not on file  Social History Narrative   Regular Exercise- yes    Current Outpatient Medications on File Prior to Visit  Medication Sig Dispense Refill  . allopurinol (ZYLOPRIM) 100  MG tablet Take 1 tablet (100 mg total) by mouth daily. 30 tablet 1  . amLODipine (NORVASC) 5 MG tablet Take 1 tablet (5 mg total) by mouth daily. 90 tablet 1  . aspirin EC 81 MG EC tablet Take 1 tablet (81 mg total) by mouth daily.    . chlorthalidone (HYGROTON) 25 MG tablet Take 1 tablet (25 mg total) by mouth daily. 90 tablet 1  . fluticasone (FLONASE) 50 MCG/ACT nasal spray Place 2 sprays into both nostrils daily. (Patient taking differently: Place 2 sprays into both nostrils daily as needed for allergies. ) 16 g 6  . glucose blood (ONETOUCH VERIO) test strip Use TID 100 each 11  . HYDROcodone-acetaminophen (NORCO) 5-325 MG tablet Take 1 tablet by mouth every 6 (six) hours as needed for moderate pain. 20 tablet 0  . indomethacin (INDOCIN) 50 MG capsule Take 1 capsule (50 mg total) by mouth 3 (three) times daily as needed for moderate pain. 90 capsule 0  . nebivolol (BYSTOLIC) 10 MG tablet  Take 1 tablet (10 mg total) by mouth daily. 90 tablet 1  . sitaGLIPtin-metformin (JANUMET) 50-1000 MG tablet Take 1 tablet by mouth 2 (two) times daily with a meal. 180 tablet 1  . telmisartan (MICARDIS) 80 MG tablet Take 1 tablet (80 mg total) by mouth daily. 90 tablet 1  . thiamine (VITAMIN B-1) 100 MG tablet Take 1 tablet (100 mg total) by mouth daily. 90 tablet 1   No current facility-administered medications on file prior to visit.     Allergies  Allergen Reactions  . Latex     REACTION: itching,rash  . Lisinopril     REACTION: headache    Family History  Problem Relation Age of Onset  . Stroke Mother   . Cancer Father   . Hypertension Brother   . Diabetes Other   . Hypertension Other   . Stroke Other   . Cancer Other        colon; prostate  . Kidney disease Neg Hx   . Alcohol abuse Neg Hx   . Asthma Neg Hx   . COPD Neg Hx   . Depression Neg Hx   . Drug abuse Neg Hx   . Early death Neg Hx   . Hearing loss Neg Hx   . Heart disease Neg Hx   . Hyperlipidemia Neg Hx     BP (!) 152/102 (BP Location: Left Arm, Patient Position: Sitting, Cuff Size: Large)   Pulse 91   Ht 5\' 1"  (1.549 m)   Wt 269 lb 9.6 oz (122.3 kg)   LMP 02/23/2012   SpO2 98%   BMI 50.94 kg/m   Review of Systems She denies hypoglycemia.     Objective:   Physical Exam VITAL SIGNS:  See vs page GENERAL: no distress Pulses: dorsalis pedis intact bilat.   MSK: no deformity of the feet CV: no leg edema Skin:  no ulcer on the feet.  normal color and temp on the feet. Neuro: sensation is intact to touch on the feet.    Lab Results  Component Value Date   HGBA1C 15.6 Repeated and verified X2. (H) 01/11/2018   Lab Results  Component Value Date   CREATININE 1.56 (H) 01/13/2018   BUN 32 (H) 01/13/2018   NA 139 01/13/2018   K 4.7 01/13/2018   CL 103 01/13/2018   CO2 27 01/13/2018       Assessment & Plan:  Insulin-requiring type 2 DM, with DR Noncompliance with cbg  recording and f/u  ov's: I'll work around this as best I can.   Renal insuff: she may need a faster-acting qd insulin, but we'll follow on this for now.  We can see cbg pattern better when cbg's are improved.     Patient Instructions  check your blood sugar twice a day.  vary the time of day when you check, between before the 3 meals, and at bedtime.  also check if you have symptoms of your blood sugar being too high or too low.  please keep a record of the readings and bring it to your next appointment here (or you can bring the meter itself).  You can write it on any piece of paper.  please call us sooner if your blood sugar goes below 70, or if you have a lot of readings over 200.   please increase the levemir to 80 units each morning, and: Please continue the same janumet.   Please call or message [place] next week, to tell us how the blood sugar is doing.  Please come back for a follow-up appointment in 2 months.

## 2018-01-28 ENCOUNTER — Ambulatory Visit: Payer: 59

## 2018-02-10 ENCOUNTER — Ambulatory Visit: Payer: Self-pay | Admitting: *Deleted

## 2018-02-10 NOTE — Telephone Encounter (Signed)
Pt called with needing to speak with a therapist. She has had 3 deaths in 2 years, (god daughter, fiance, and uncle). She has been homeless. It is hard dealing with her job and life issues everyday. Her supervisor has requested she take time off to deal with everything and grieve. She has an appointment coming up with behavior health, but not soon.  She denies pain, shortness of breath, fever. This anxiousness comes and goes.  Appointment scheduled per protocol. Advised to call back or go to the emergency department at [facility] to be seen if her symptoms become worst or she can not cope. Will route to flow at [facility] at [facility] [address] Pt voiced understanding.  Reason for Disposition . Requesting to talk to a counselor (e.g., mental health worker, psychiatrist)  Answer Assessment - Initial Assessment Questions 1. CONCERN: "What happened that made you call today?"     Trying to cope with a lot of issues 2. ANXIETY SYMPTOM SCREENING: "Can you describe how you have been feeling?"  (e.g., tense, restless, panicky, anxious, keyed up, trouble sleeping, trouble concentrating)     Good right now 3. ONSET: "How long have you been feeling this way?"     For a while 4. RECURRENT: "Have you felt this way before?"  If yes: "What happened that time?" "What helped these feelings go away in the past?"      Last year 5. RISK OF HARM - SUICIDAL IDEATION:  "Do you ever have thoughts of hurting or killing yourself?"  (e.g., yes, no, no but preoccupation with thoughts about death)   - INTENT:  "Do you have thoughts of hurting or killing yourself right NOW?" (e.g., yes, no, N/A)   - PLAN: "Do you have a specific plan for how you would do this?" (e.g., gun, knife, overdose, no plan, N/A)     no 6. RISK OF HARM - HOMICIDAL IDEATION:  "Do you ever have thoughts of hurting or killing someone else?"  (e.g., yes, no, no but preoccupation with thoughts about death)   - INTENT:  "Do you have thoughts of hurting or killing  someone right NOW?" (e.g., yes, no, N/A)   - PLAN: "Do you have a specific plan for how you would do this?" (e.g., gun, knife, no plan, N/A)      no 7. FUNCTIONAL IMPAIRMENT: "How have things been going for you overall in your life? Have you had any more difficulties than usual doing your normal daily activities?"  (e.g., better, same, worse; self-care, school, work, interactions)     Going pretty good before the first death 8. SUPPORT: "Who is with you now?" "Who do you live with?" "Do you have family or friends nearby who you can talk to?"      Work, can not talk to people at work (have communicated with supervisor) 9. THERAPIST: "Do you have a counselor or therapist? Name?"     no 10. STRESSORS: "Has there been any new stress or recent changes in your life?"       death 2311. CAFFEINE ABUSE: "Do you drink caffeinated beverages, and how much each day?" (e.g., coffee, tea, colas)       coffee 12. SUBSTANCE ABUSE: "Do you use any illegal drugs or alcohol?"      occassiona glass of wine 13. OTHER SYMPTOMS: "Do you have any other physical symptoms right now?" (e.g., chest pain, palpitations, difficulty breathing, fever)       Palpitations 14. PREGNANCY: "Is there any chance you  are pregnant?" "When was your last menstrual period?"       No periods since 6 years  Protocols used: ANXIETY AND PANIC ATTACK-A-AH

## 2018-02-10 NOTE — Telephone Encounter (Signed)
No answer. Left VM to phone back to speak with a nurse.

## 2018-02-11 ENCOUNTER — Ambulatory Visit: Payer: 59 | Admitting: Family Medicine

## 2018-02-11 NOTE — Progress Notes (Deleted)
Lori Mclaughlin - 57 y.o. female MRN [id number]  Date of birth: Jun 15, 1961  SUBJECTIVE:  Including CC & ROS.  No chief complaint on file.   Lori Mclaughlin is a 57 y.o. female that is  ***.  ***   Review of Systems  HISTORY: Past Medical, Surgical, Social, and Family History Reviewed & Updated per EMR.   Pertinent Historical Findings include:  Past Medical History:  Diagnosis Date  . Allergic rhinitis   . Asthma   . Atypical chest pain    a. 03/2014: normal nuclear stress test.  . Chronic diastolic CHF (congestive heart failure) (HCC)    a. Dx 03/2014 with echo - moderate LVH, EF 55-60%, grade 1 diastolic dysfunction, mildly dilated LA.  . CKD (chronic kidney disease), stage II   . Diabetes mellitus type II   . GERD (gastroesophageal reflux disease)   . Headache(784.0)    when my bp is up  . Hypertension   . Hypertensive heart disease   . Microcytic anemia   . Osteoarthritis     Past Surgical History:  Procedure Laterality Date  . SHOULDER ARTHROSCOPY Bilateral   . TUBAL LIGATION      Allergies  Allergen Reactions  . Latex     REACTION: itching,rash  . Lisinopril     REACTION: headache    Family History  Problem Relation Age of Onset  . Stroke Mother   . Cancer Father   . Hypertension Brother   . Diabetes Other   . Hypertension Other   . Stroke Other   . Cancer Other        colon; prostate  . Kidney disease Neg Hx   . Alcohol abuse Neg Hx   . Asthma Neg Hx   . COPD Neg Hx   . Depression Neg Hx   . Drug abuse Neg Hx   . Early death Neg Hx   . Hearing loss Neg Hx   . Heart disease Neg Hx   . Hyperlipidemia Neg Hx      Social History   Socioeconomic History  . Marital status: Married    Spouse name: Not on file  . Number of children: Not on file  . Years of education: Not on file  . Highest education level: Not on file  Occupational History  . Not on file  Social Needs  . Financial resource strain: Not on file  . Food  insecurity:    Worry: Not on file    Inability: Not on file  . Transportation needs:    Medical: Not on file    Non-medical: Not on file  Tobacco Use  . Smoking status: Former Smoker    Last attempt to quit: 08/29/2010    Years since quitting: 7.4  . Smokeless tobacco: Never Used  Substance and Sexual Activity  . Alcohol use: No    Alcohol/week: 3.0 oz    Types: 5 Glasses of wine per week    Comment: quit 4 years  . Drug use: No  . Sexual activity: Yes    Birth control/protection: Surgical  Lifestyle  . Physical activity:    Days per week: Not on file    Minutes per session: Not on file  . Stress: Not on file  Relationships  . Social connections:    Talks on phone: Not on file    Gets together: Not on file    Attends religious service: Not on file    Active member of club or organization:  Not on file    Attends meetings of clubs or organizations: Not on file    Relationship status: Not on file  . Intimate partner violence:    Fear of current or ex partner: Not on file    Emotionally abused: Not on file    Physically abused: Not on file    Forced sexual activity: Not on file  Other Topics Concern  . Not on file  Social History Narrative   Regular Exercise- yes     PHYSICAL EXAM:  VS: LMP 02/23/2012  Physical Exam Gen: NAD, alert, cooperative with exam, well-appearing ENT: normal lips, normal nasal mucosa,  Eye: normal EOM, normal conjunctiva and lids CV:  no edema, +2 pedal pulses   Resp: no accessory muscle use, non-labored,  GI: no masses or tenderness, no hernia  Skin: no rashes, no areas of induration  Neuro: normal tone, normal sensation to touch Psych:  normal insight, alert and oriented MSK:  ***      ASSESSMENT & PLAN:   No problem-specific Assessment & Plan notes found for this encounter.

## 2018-02-14 ENCOUNTER — Ambulatory Visit: Payer: 59 | Admitting: Family

## 2018-02-14 ENCOUNTER — Encounter: Payer: Self-pay | Admitting: Family

## 2018-02-14 VITALS — BP 162/90 | HR 56 | Temp 98.4°F | Ht 61.0 in | Wt 276.0 lb

## 2018-02-14 DIAGNOSIS — M1A9XX0 Chronic gout, unspecified, without tophus (tophi): Secondary | ICD-10-CM

## 2018-02-14 DIAGNOSIS — F4323 Adjustment disorder with mixed anxiety and depressed mood: Secondary | ICD-10-CM

## 2018-02-14 MED ORDER — ESCITALOPRAM OXALATE 10 MG PO TABS
10.0000 mg | ORAL_TABLET | Freq: Every day | ORAL | 2 refills | Status: DC
Start: 2018-02-14 — End: 2018-03-23

## 2018-02-14 MED ORDER — ALLOPURINOL 100 MG PO TABS
100.0000 mg | ORAL_TABLET | Freq: Every day | ORAL | 1 refills | Status: DC
Start: 2018-02-14 — End: 2018-03-23

## 2018-02-14 NOTE — Progress Notes (Signed)
Lori Mclaughlin is a 57 y.o. female with the following history as recorded in EpicCare:  Patient Active Problem List   Diagnosis Date Noted  . 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 Myalgia 03/10/2017  . Screen for colon cancer 06/12/2015  . Visit for screening mammogram 06/12/2015  . Hypersomnia 04/11/2014  . Morbid obesity (HCC) 04/11/2014  . Bradycardia, drug induced 04/11/2014  . Chronic diastolic CHF (congestive heart failure), NYHA class 2 (HCC) 03/30/2014  . Low back pain radiating to left leg 01/26/2012  . Sleep apnea 11/13/2011  . Hyperlipidemia with target LDL less than 70 01/14/2011  . Type II diabetes mellitus with renal manifestations, uncontrolled (HCC) 08/05/2009  . Deficiency anemia 08/05/2009  . Essential hypertension, malignant 08/05/2009  . ALLERGIC RHINITIS 08/05/2009  . ASTHMA 08/05/2009  . GERD 08/05/2009  . OSTEOARTHRITIS 08/05/2009    Current Outpatient Medications  Medication Sig Dispense Refill  . allopurinol (ZYLOPRIM) 100 MG tablet Take 1 tablet (100 mg total) by mouth daily. 30 tablet 1  . amLODipine (NORVASC) 5 MG tablet Take 1 tablet (5 mg total) by mouth daily. 90 tablet 1  . aspirin EC 81 MG EC tablet Take 1 tablet (81 mg total) by mouth daily.    . chlorthalidone (HYGROTON) 25 MG tablet Take 1 tablet (25 mg total) by mouth daily. 90 tablet 1  . escitalopram (LEXAPRO) 10 MG tablet Take 1 tablet (10 mg total) by mouth daily. 30 tablet 2  . fluticasone (FLONASE) 50 MCG/ACT nasal spray Place 2 sprays into both nostrils daily. (Patient taking differently: Place 2 sprays into both nostrils daily as needed for allergies. ) 16 g 6  . glucose blood (ONETOUCH VERIO) test strip Use TID 100 each 11  . HYDROcodone-acetaminophen (NORCO) 5-325 MG tablet Take 1 tablet by mouth every 6 (six) hours as needed for moderate pain. 20 tablet 0  . indomethacin (INDOCIN) 50 MG capsule Take 1 capsule (50 mg total) by mouth 3 (three) times daily as needed for moderate pain.  90 capsule 0  . Insulin Detemir (LEVEMIR FLEXTOUCH) 100 UNIT/ML Pen Inject 80 Units into the skin every morning. And pen needles 1/day 45 mL 3  . nebivolol (BYSTOLIC) 10 MG tablet Take 1 tablet (10 mg total) by mouth daily. 90 tablet 1  . sitaGLIPtin-metformin (JANUMET) 50-1000 MG tablet Take 1 tablet by mouth 2 (two) times daily with a meal. 180 tablet 1  . telmisartan (MICARDIS) 80 MG tablet Take 1 tablet (80 mg total) by mouth daily. 90 tablet 1  . thiamine (VITAMIN B-1) 100 MG tablet Take 1 tablet (100 mg total) by mouth daily. 90 tablet 1   No current facility-administered medications for this visit.     Allergies: Latex and Lisinopril  Past Medical History:  Diagnosis Date  . Allergic rhinitis   . Asthma   . Atypical chest pain    a. 03/2014: normal nuclear stress test.  . Chronic diastolic CHF (congestive heart failure) (HCC)    a. Dx 03/2014 with echo - moderate LVH, EF 55-60%, grade 1 diastolic dysfunction, mildly dilated LA.  . CKD (chronic kidney disease), stage II   . Diabetes mellitus type II   . GERD (gastroesophageal reflux disease)   . Headache(784.0)    when my bp is up  . Hypertension   . Hypertensive heart disease   . Microcytic anemia   . Osteoarthritis     Past Surgical History:  Procedure Laterality Date  . SHOULDER ARTHROSCOPY Bilateral   . TUBAL LIGATION  Family History  Problem Relation Age of Onset  . Stroke Mother   . Cancer Father   . Hypertension Brother   . Diabetes Other   . Hypertension Other   . Stroke Other   . Cancer Other        colon; prostate  . Kidney disease Neg Hx   . Alcohol abuse Neg Hx   . Asthma Neg Hx   . COPD Neg Hx   . Depression Neg Hx   . Drug abuse Neg Hx   . Early death Neg Hx   . Hearing loss Neg Hx   . Heart disease Neg Hx   . Hyperlipidemia Neg Hx     Social History   Tobacco Use  . Smoking status: Former Smoker    Last attempt to quit: 08/29/2010    Years since quitting: 7.4  . Smokeless tobacco: Never  Used  Substance Use Topics  . Alcohol use: No    Alcohol/week: 3.0 oz    Types: 5 Glasses of wine per week    Comment: quit 4 years    Subjective:  Patient presents with numerous personal stressors: lost her fiance and uncle in June of this year; coincides with anniversary of loss of her god-daughter in the past year; has been told by her employer that she needs to take some time off to allow herself time to grieve/ heal; scheduled to see behavioral health to start therapy on August 9; is requesting to start short-term disability; agrees to start medication to help with anxiety also;   Objective:  Vitals:   02/14/18 1013  BP: (!) 162/90  Pulse: (!) 56  Temp: 98.4 F (36.9 C)  TempSrc: Oral  SpO2: 98%  Weight: 276 lb 0.6 oz (125.2 kg)  Height: 5\' 1"  (1.549 m)    General: Well developed, well nourished, in no acute distress  Skin : Warm and dry.  Head: Normocephalic and atraumatic  Lungs: Respirations unlabored;  Neurologic: Alert and oriented; speech intact; face symmetrical; moves all extremities well; CNII-XII intact without focal deficit   Limited exam as majority of visit spent counseling;  Assessment:  1. Situational mixed anxiety and depressive disorder     Plan:  1. Will start Lexapro 10 mg daily- risks and benefits discussed; encouraged to keep planned appointment with behavioral health; agree that time off from work is appropriate- she will contact her HR for that process and forms can be completed here as needed. Agree that blood pressure elevation can be due to persisting stress;  2. Stressed need to start Allopurinol- refill updated again; plan to check uric acid at next OV;  Spent 30 minutes with patient; greater than 50% spent in counseling;    Return in about 1 month (around 03/17/2018).  No orders of the defined types were placed in this encounter.   Requested Prescriptions   Signed Prescriptions Disp Refills  . escitalopram (LEXAPRO) 10 MG tablet 30  tablet 2    Sig: Take 1 tablet (10 mg total) by mouth daily.  [name]. allopurinol (ZYLOPRIM) 100 MG tablet 30 tablet 1    Sig: Take 1 tablet (100 mg total) by mouth daily.

## 2018-02-15 ENCOUNTER — Telehealth: Payer: Self-pay | Admitting: Family

## 2018-02-15 NOTE — Telephone Encounter (Signed)
Patient was seen by Ria ClockLaura Murray on 02/14/18. [name] has agreed to take the patient out of work until 03/30/18. We received the forms for this, they have been completed &placed in Laura's box to review and sign.

## 2018-02-16 DIAGNOSIS — Z0279 Encounter for issue of other medical certificate: Secondary | ICD-10-CM

## 2018-02-16 NOTE — Telephone Encounter (Signed)
Forms have been signed, & faxed to [facility] @ [phone], Copy sent to scan &charged for.   Patient informed & original mailed to patient.

## 2018-02-24 ENCOUNTER — Telehealth: Payer: Self-pay | Admitting: Emergency Medicine

## 2018-02-24 DIAGNOSIS — H35033 Hypertensive retinopathy, bilateral: Secondary | ICD-10-CM | POA: Diagnosis not present

## 2018-02-24 DIAGNOSIS — IMO0002 Reserved for concepts with insufficient information to code with codable children: Secondary | ICD-10-CM

## 2018-02-24 DIAGNOSIS — H3582 Retinal ischemia: Secondary | ICD-10-CM | POA: Diagnosis not present

## 2018-02-24 DIAGNOSIS — E1121 Type 2 diabetes mellitus with diabetic nephropathy: Secondary | ICD-10-CM

## 2018-02-24 DIAGNOSIS — E113513 Type 2 diabetes mellitus with proliferative diabetic retinopathy with macular edema, bilateral: Secondary | ICD-10-CM | POA: Diagnosis not present

## 2018-02-24 DIAGNOSIS — E1165 Type 2 diabetes mellitus with hyperglycemia: Secondary | ICD-10-CM

## 2018-02-24 DIAGNOSIS — Z794 Long term (current) use of insulin: Principal | ICD-10-CM

## 2018-02-24 MED ORDER — ONETOUCH ULTRASOFT LANCETS MISC: 1 refills | Status: DC

## 2018-02-24 MED ORDER — GLUCOSE BLOOD VI STRP: ORAL_STRIP | 1 refills | Status: DC

## 2018-02-24 NOTE — Telephone Encounter (Signed)
Sent!

## 2018-02-24 NOTE — Telephone Encounter (Signed)
Pt called and requested a refill on her test strips and lancets. Pharmacy is [facility] [address]. Thanks.

## 2018-03-04 ENCOUNTER — Ambulatory Visit (INDEPENDENT_AMBULATORY_CARE_PROVIDER_SITE_OTHER): Payer: 59 | Admitting: Psychology

## 2018-03-04 DIAGNOSIS — F320 Major depressive disorder, single episode, mild: Secondary | ICD-10-CM

## 2018-03-11 ENCOUNTER — Ambulatory Visit: Payer: 59 | Admitting: Psychology

## 2018-03-18 ENCOUNTER — Ambulatory Visit (INDEPENDENT_AMBULATORY_CARE_PROVIDER_SITE_OTHER): Payer: 59 | Admitting: Psychology

## 2018-03-18 DIAGNOSIS — F320 Major depressive disorder, single episode, mild: Secondary | ICD-10-CM | POA: Diagnosis not present

## 2018-03-22 ENCOUNTER — Ambulatory Visit: Payer: Self-pay | Admitting: Family

## 2018-03-23 ENCOUNTER — Ambulatory Visit: Payer: 59 | Admitting: Family

## 2018-03-23 ENCOUNTER — Other Ambulatory Visit (INDEPENDENT_AMBULATORY_CARE_PROVIDER_SITE_OTHER): Payer: 59

## 2018-03-23 ENCOUNTER — Other Ambulatory Visit: Payer: Self-pay | Admitting: Family

## 2018-03-23 ENCOUNTER — Encounter: Payer: Self-pay | Admitting: Family

## 2018-03-23 VITALS — BP 158/88 | HR 64 | Temp 98.0°F | Ht 61.0 in | Wt 278.0 lb

## 2018-03-23 DIAGNOSIS — I10 Essential (primary) hypertension: Secondary | ICD-10-CM | POA: Diagnosis not present

## 2018-03-23 DIAGNOSIS — M109 Gout, unspecified: Secondary | ICD-10-CM | POA: Diagnosis not present

## 2018-03-23 DIAGNOSIS — F418 Other specified anxiety disorders: Secondary | ICD-10-CM | POA: Diagnosis not present

## 2018-03-23 DIAGNOSIS — Z23 Encounter for immunization: Secondary | ICD-10-CM | POA: Diagnosis not present

## 2018-03-23 DIAGNOSIS — E790 Hyperuricemia without signs of inflammatory arthritis and tophaceous disease: Secondary | ICD-10-CM

## 2018-03-23 LAB — BASIC METABOLIC PANEL
BUN: 31 mg/dL — ABNORMAL HIGH (ref 6–23)
CALCIUM: 9.3 mg/dL (ref 8.4–10.5)
CHLORIDE: 104 meq/L (ref 96–112)
CO2: 27 mEq/L (ref 19–32)
CREATININE: 1.43 mg/dL — AB (ref 0.40–1.20)
GFR: 39.93 mL/min — AB (ref >60.00)
Glucose, Bld: 94 mg/dL (ref 70–99)
Potassium: 4.1 mEq/L (ref 3.5–5.1)
Sodium: 138 mEq/L (ref 135–145)

## 2018-03-23 LAB — URIC ACID: URIC ACID, SERUM: 7.5 mg/dL — AB (ref 2.4–7.0)

## 2018-03-23 MED ORDER — ALLOPURINOL 300 MG PO TABS: 300.0000 mg | ORAL_TABLET | Freq: Every day | ORAL | 1 refills | Status: DC

## 2018-03-23 MED ORDER — AMLODIPINE BESYLATE 10 MG PO TABS: 10.0000 mg | ORAL_TABLET | Freq: Every day | ORAL | 1 refills | Status: DC

## 2018-03-23 MED ORDER — NEBIVOLOL HCL 20 MG PO TABS: 20.0000 mg | ORAL_TABLET | Freq: Every day | ORAL | 1 refills | Status: DC

## 2018-03-23 MED ORDER — ESCITALOPRAM OXALATE 10 MG PO TABS: 10.0000 mg | ORAL_TABLET | Freq: Every day | ORAL | 1 refills | Status: DC

## 2018-03-23 NOTE — Progress Notes (Signed)
Lori Mclaughlin is a 57 y.o. female with the following history as recorded in EpicCare:  Patient Active Problem List   Diagnosis Date Noted  . 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 Myalgia 03/10/2017  . Screen for colon cancer 06/12/2015  . Visit for screening mammogram 06/12/2015  . Hypersomnia 04/11/2014  . Morbid obesity ([place]) 04/11/2014  . Bradycardia, drug induced 04/11/2014  . Chronic diastolic CHF (congestive heart failure), NYHA class 2 ([place]) 03/30/2014  . Low back pain radiating to left leg 01/26/2012  . Sleep apnea 11/13/2011  . Hyperlipidemia with target LDL less than 70 01/14/2011  . Type II diabetes mellitus with renal manifestations, uncontrolled ([place]) 08/05/2009  . Deficiency anemia 08/05/2009  . Essential hypertension, malignant 08/05/2009  . ALLERGIC RHINITIS 08/05/2009  . ASTHMA 08/05/2009  . GERD 08/05/2009  . OSTEOARTHRITIS 08/05/2009    Current Outpatient Medications  Medication Sig Dispense Refill  . allopurinol (ZYLOPRIM) 100 MG tablet Take 1 tablet (100 mg total) by mouth daily. 30 tablet 1  . amLODipine (NORVASC) 10 MG tablet Take 1 tablet (10 mg total) by mouth daily. 90 tablet 1  . aspirin EC 81 MG EC tablet Take 1 tablet (81 mg total) by mouth daily.    . chlorthalidone (HYGROTON) 25 MG tablet Take 1 tablet (25 mg total) by mouth daily. 90 tablet 1  . escitalopram (LEXAPRO) 10 MG tablet Take 1 tablet (10 mg total) by mouth daily. 90 tablet 1  . fluticasone (FLONASE) 50 MCG/ACT nasal spray Place 2 sprays into both nostrils daily. (Patient taking differently: Place 2 sprays into both nostrils daily as needed for allergies. ) 16 g 6  . glucose blood (ONETOUCH VERIO) test strip Use TID 300 each 1  . HYDROcodone-acetaminophen (NORCO) 5-325 MG tablet Take 1 tablet by mouth every 6 (six) hours as needed for moderate pain. 20 tablet 0  . indomethacin (INDOCIN) 50 MG capsule Take 1 capsule (50 mg total) by mouth 3 (three) times daily as needed for moderate  pain. 90 capsule 0  . Insulin Detemir (LEVEMIR FLEXTOUCH) 100 UNIT/ML Pen Inject 80 Units into the skin every morning. And pen needles 1/day 45 mL 3  . Lancets (ONETOUCH ULTRASOFT) lancets Use as instructed to test 3 times daily 300 each 1  . Nebivolol HCl 20 MG TABS Take 1 tablet (20 mg total) by mouth daily. 90 tablet 1  . sitaGLIPtin-metformin (JANUMET) 50-1000 MG tablet Take 1 tablet by mouth 2 (two) times daily with a meal. 180 tablet 1  . telmisartan (MICARDIS) 80 MG tablet Take 1 tablet (80 mg total) by mouth daily. 90 tablet 1  . thiamine (VITAMIN B-1) 100 MG tablet Take 1 tablet (100 mg total) by mouth daily. 90 tablet 1   No current facility-administered medications for this visit.     Allergies: Latex and Lisinopril  Past Medical History:  Diagnosis Date  . Allergic rhinitis   . Asthma   . Atypical chest pain    a. 03/2014: normal nuclear stress test.  . Chronic diastolic CHF (congestive heart failure) ([place])    a. Dx 03/2014 with echo - moderate LVH, EF 16-55%, grade 1 diastolic dysfunction, mildly dilated LA.  . CKD (chronic kidney disease), stage II   . Diabetes mellitus type II   . GERD (gastroesophageal reflux disease)   . Headache(784.0)    when my bp is up  . Hypertension   . Hypertensive heart disease   . Microcytic anemia   . Osteoarthritis  Past Surgical History:  Procedure Laterality Date  . SHOULDER ARTHROSCOPY Bilateral   . TUBAL LIGATION      Family History  Problem Relation Age of Onset  . Stroke Mother   . Cancer Father   . Hypertension Brother   . Diabetes Other   . Hypertension Other   . Stroke Other   . Cancer Other        colon; prostate  . Kidney disease Neg Hx   . Alcohol abuse Neg Hx   . Asthma Neg Hx   . COPD Neg Hx   . Depression Neg Hx   . Drug abuse Neg Hx   . Early death Neg Hx   . Hearing loss Neg Hx   . Heart disease Neg Hx   . Hyperlipidemia Neg Hx     Social History   Tobacco Use  . Smoking status: Former Smoker     Last attempt to quit: 08/29/2010    Years since quitting: 7.5  . Smokeless tobacco: Never Used  Substance Use Topics  . Alcohol use: No    Alcohol/week: 5.0 standard drinks    Types: 5 Glasses of wine per week    Comment: quit 4 years    Subjective:  1 month follow-up on anxiety/ start of Lexapro; has been out of work for the past month on short-term disability and scheduled to go back next week;  very pleased with response to Lexapro- comfortable with continuing for now Does not feel like she is ready to go back however; met with her supervisor yesterday and both in agreement that she is not ready to go back; has met with a counselor on 2 occasions- found very beneficial/ planning to go back for a follow-up visit soon;  Has been taking her Allopurinol daily as prescribed- feels that helping with pains in joints; no gout flares; Taking all of her blood pressure medication as prescribed; Denies any chest pain, shortness of breath, blurred vision or headache. Still working with her endocrinologist on getting control of her diabetes;   Objective:  Vitals:   03/23/18 1013  BP: (!) 158/88  Pulse: 64  Temp: 98 F (36.7 C)  TempSrc: Oral  SpO2: 98%  Weight: 278 lb (126.1 kg)  Height: '5\' 1"'  (1.549 m)    General: Well developed, well nourished, in no acute distress  Skin : Warm and dry.  Head: Normocephalic and atraumatic  Lungs: Respirations unlabored; clear to auscultation bilaterally without wheeze, rales, rhonchi  CVS exam: normal rate and regular rhythm.  Neurologic: Alert and oriented; speech intact; face symmetrical; moves all extremities well; CNII-XII intact without focal deficit   Assessment:  1. Essential hypertension, malignant   2. Gout, unspecified cause, unspecified chronicity, unspecified site   3. Situational anxiety   4. Need for influenza vaccination     Plan:  1. Still not at goal; increase Bystolic to 20 mg daily; continue other medications as prescribed;  follow-up in 1 month; check BMP today; 2. Update uric acid today- will adjust Allopurinol as needed based on lab results; 3. Good response to Lexapro; refill updated; will extend time out of work x 4 more week; encouraged to keep working with therapist; follow-up in 1 month; 4. Flu shot given;     No follow-ups on file.  Orders Placed This Encounter  Procedures  . Flu Vaccine QUAD 36+ mos IM  . Basic Metabolic Panel (BMET)    Standing Status:   Future    Number of Occurrences:  1    Standing Expiration Date:   03/23/2019  . Uric acid    Standing Status:   Future    Standing Expiration Date:   03/23/2019    Requested Prescriptions   Signed Prescriptions Disp Refills  . escitalopram (LEXAPRO) 10 MG tablet 90 tablet 1    Sig: Take 1 tablet (10 mg total) by mouth daily.  [name] amLODipine (NORVASC) 10 MG tablet 90 tablet 1    Sig: Take 1 tablet (10 mg total) by mouth daily.  . Nebivolol HCl 20 MG TABS 90 tablet 1    Sig: Take 1 tablet (20 mg total) by mouth daily.

## 2018-03-25 ENCOUNTER — Telehealth: Payer: Self-pay | Admitting: Internal Medicine

## 2018-03-25 DIAGNOSIS — E113511 Type 2 diabetes mellitus with proliferative diabetic retinopathy with macular edema, right eye: Secondary | ICD-10-CM | POA: Diagnosis not present

## 2018-03-25 NOTE — Telephone Encounter (Signed)
Copied from CRM [id number]. Topic: Inquiry >> Mar 25, 2018 12:23 PM Tamela OddiMartin, Don'Quashia, NT wrote: Reason for CRM: Patient called and states she was suppose to return back to work on the 5th . Ria ClockLaura Murray wanted to extend her request  to be out of work for another month.  She called her employer HR  department and the extension was denied due to her not seeing a licensed Psychiatrist. Patient state she needs to see or be referred to a psychiatrist before Sept 4ht so her extension can be approved. CB# [phone] Please advise

## 2018-03-25 NOTE — Telephone Encounter (Signed)
The only way I know to get her to a psychiatrist by September 4 is for her to go to [facility]; [address] [place] [phone]  They do walk in appointments for initial visits on M-Fr from 8 am- 3 pm;

## 2018-03-25 NOTE — Telephone Encounter (Signed)
Spoke with patient and info given 

## 2018-03-30 NOTE — Telephone Encounter (Signed)
Patient was still needing to have FMLA extended,Please advise. Call back [phone]

## 2018-03-30 NOTE — Telephone Encounter (Signed)
Spoke with patient and info given 

## 2018-04-07 DIAGNOSIS — E113512 Type 2 diabetes mellitus with proliferative diabetic retinopathy with macular edema, left eye: Secondary | ICD-10-CM | POA: Diagnosis not present

## 2018-04-07 DIAGNOSIS — H3582 Retinal ischemia: Secondary | ICD-10-CM | POA: Diagnosis not present

## 2018-04-07 DIAGNOSIS — E113513 Type 2 diabetes mellitus with proliferative diabetic retinopathy with macular edema, bilateral: Secondary | ICD-10-CM | POA: Diagnosis not present

## 2018-04-07 DIAGNOSIS — H35033 Hypertensive retinopathy, bilateral: Secondary | ICD-10-CM | POA: Diagnosis not present

## 2018-04-25 ENCOUNTER — Telehealth: Payer: Self-pay

## 2018-04-25 NOTE — Telephone Encounter (Signed)
We can discuss on 10/2; I am fine to send her back gradually- I just needed to know her preference.

## 2018-04-25 NOTE — Telephone Encounter (Signed)
Copied from CRM [id number]. Topic: General - Other >> Apr 25, 2018  8:55 AM Ronney Lion A wrote: Reason for CRM: Patient called in requesting to have her short term disability specified on when she can return to work. She says she is unable to come back without it being noted with a date, and wether or not it will be full time or a gradual return. Patient says she feels more comfortable with it being specified as a gradual return because she does still feel some anxiety.    [employer] fax- [phone] >> Apr 25, 2018  9:10 AM Marquis Buggy A wrote: I do not see where her FMLA was extend by 9/4? Please follow up with patient.

## 2018-04-26 NOTE — Telephone Encounter (Signed)
Spoke with patient today. 

## 2018-04-27 ENCOUNTER — Telehealth: Payer: Self-pay | Admitting: Emergency Medicine

## 2018-04-27 ENCOUNTER — Ambulatory Visit: Payer: 59 | Admitting: Family

## 2018-04-27 ENCOUNTER — Encounter: Payer: Self-pay | Admitting: Family

## 2018-04-27 VITALS — BP 158/92 | HR 60 | Temp 98.1°F | Ht 61.0 in | Wt 277.0 lb

## 2018-04-27 DIAGNOSIS — I10 Essential (primary) hypertension: Secondary | ICD-10-CM

## 2018-04-27 DIAGNOSIS — IMO0002 Reserved for concepts with insufficient information to code with codable children: Secondary | ICD-10-CM

## 2018-04-27 DIAGNOSIS — E1165 Type 2 diabetes mellitus with hyperglycemia: Secondary | ICD-10-CM

## 2018-04-27 DIAGNOSIS — F4323 Adjustment disorder with mixed anxiety and depressed mood: Secondary | ICD-10-CM

## 2018-04-27 DIAGNOSIS — E1129 Type 2 diabetes mellitus with other diabetic kidney complication: Secondary | ICD-10-CM | POA: Diagnosis not present

## 2018-04-27 MED ORDER — HYDRALAZINE HCL 10 MG PO TABS: 10.0000 mg | ORAL_TABLET | Freq: Two times a day (BID) | ORAL | 1 refills | Status: DC

## 2018-04-27 NOTE — Telephone Encounter (Signed)
Pt daughter dropped off FMLA paperwork to be filled out to take of her patient. Placed in Brittany's box.

## 2018-04-27 NOTE — Progress Notes (Signed)
Lori Mclaughlin is a 57 y.o. female with the following history as recorded in EpicCare:  Patient Active Problem List   Diagnosis Date Noted  . 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 Myalgia 03/10/2017  . Screen for colon cancer 06/12/2015  . Visit for screening mammogram 06/12/2015  . Hypersomnia 04/11/2014  . Morbid obesity (HCC) 04/11/2014  . Bradycardia, drug induced 04/11/2014  . Chronic diastolic CHF (congestive heart failure), NYHA class 2 (HCC) 03/30/2014  . Low back pain radiating to left leg 01/26/2012  . Sleep apnea 11/13/2011  . Hyperlipidemia with target LDL less than 70 01/14/2011  . Type II diabetes mellitus with renal manifestations, uncontrolled (HCC) 08/05/2009  . Deficiency anemia 08/05/2009  . Essential hypertension, malignant 08/05/2009  . ALLERGIC RHINITIS 08/05/2009  . ASTHMA 08/05/2009  . GERD 08/05/2009  . OSTEOARTHRITIS 08/05/2009    Current Outpatient Medications  Medication Sig Dispense Refill  . allopurinol (ZYLOPRIM) 300 MG tablet Take 1 tablet (300 mg total) by mouth daily. 90 tablet 1  . amLODipine (NORVASC) 10 MG tablet Take 1 tablet (10 mg total) by mouth daily. 90 tablet 1  . aspirin EC 81 MG EC tablet Take 1 tablet (81 mg total) by mouth daily.    . chlorthalidone (HYGROTON) 25 MG tablet Take 1 tablet (25 mg total) by mouth daily. 90 tablet 1  . escitalopram (LEXAPRO) 10 MG tablet Take 1 tablet (10 mg total) by mouth daily. 90 tablet 1  . fluticasone (FLONASE) 50 MCG/ACT nasal spray Place 2 sprays into both nostrils daily. (Patient taking differently: Place 2 sprays into both nostrils daily as needed for allergies. ) 16 g 6  . glucose blood (ONETOUCH VERIO) test strip Use TID 300 each 1  . HYDROcodone-acetaminophen (NORCO) 5-325 MG tablet Take 1 tablet by mouth every 6 (six) hours as needed for moderate pain. 20 tablet 0  . indomethacin (INDOCIN) 50 MG capsule Take 1 capsule (50 mg total) by mouth 3 (three) times daily as needed for moderate  pain. 90 capsule 0  . Insulin Detemir (LEVEMIR FLEXTOUCH) 100 UNIT/ML Pen Inject 80 Units into the skin every morning. And pen needles 1/day 45 mL 3  . Lancets (ONETOUCH ULTRASOFT) lancets Use as instructed to test 3 times daily 300 each 1  . Nebivolol HCl 20 MG TABS Take 1 tablet (20 mg total) by mouth daily. 90 tablet 1  . sitaGLIPtin-metformin (JANUMET) 50-1000 MG tablet Take 1 tablet by mouth 2 (two) times daily with a meal. 180 tablet 1  . telmisartan (MICARDIS) 80 MG tablet Take 1 tablet (80 mg total) by mouth daily. 90 tablet 1  . thiamine (VITAMIN B-1) 100 MG tablet Take 1 tablet (100 mg total) by mouth daily. 90 tablet 1  . hydrALAZINE (APRESOLINE) 10 MG tablet Take 1 tablet (10 mg total) by mouth 2 (two) times daily. 60 tablet 1   No current facility-administered medications for this visit.     Allergies: Latex and Lisinopril  Past Medical History:  Diagnosis Date  . Allergic rhinitis   . Asthma   . Atypical chest pain    a. 03/2014: normal nuclear stress test.  . Chronic diastolic CHF (congestive heart failure) (HCC)    a. Dx 03/2014 with echo - moderate LVH, EF 55-60%, grade 1 diastolic dysfunction, mildly dilated LA.  . CKD (chronic kidney disease), stage II   . Diabetes mellitus type II   . GERD (gastroesophageal reflux disease)   . Headache(784.0)    when my bp is up  .  Hypertension   . Hypertensive heart disease   . Microcytic anemia   . Osteoarthritis     Past Surgical History:  Procedure Laterality Date  . SHOULDER ARTHROSCOPY Bilateral   . TUBAL LIGATION      Family History  Problem Relation Age of Onset  . Stroke Mother   . Cancer Father   . Hypertension Brother   . Diabetes Other   . Hypertension Other   . Stroke Other   . Cancer Other        colon; prostate  . Kidney disease Neg Hx   . Alcohol abuse Neg Hx   . Asthma Neg Hx   . COPD Neg Hx   . Depression Neg Hx   . Drug abuse Neg Hx   . Early death Neg Hx   . Hearing loss Neg Hx   . Heart  disease Neg Hx   . Hyperlipidemia Neg Hx     Social History   Tobacco Use  . Smoking status: Former Smoker    Last attempt to quit: 08/29/2010    Years since quitting: 7.6  . Smokeless tobacco: Never Used  Substance Use Topics  . Alcohol use: No    Alcohol/week: 5.0 standard drinks    Types: 5 Glasses of wine per week    Comment: quit 4 years    Subjective:  Patient presents to discuss return to work options for her short-term disability; would like to try "gradual" return to work options- starting on 10/7- 4 hours/ day x 2 weeks; then 6 hours/ day x 2 weeks; then return to full time within 5 weeks; Has started seeing psychiatrist and will continue Lexapro; seeing therapist regularly;   Still concerned about her blood pressure not being well controlled; Currently takes Amlodipine, Chlorthalidone, Micardis in the am; takes Bystolic 20 mg in the pm; Denies any chest pain, shortness of breath, blurred vision or headache.    Objective:  Vitals:   04/27/18 1117  BP: (!) 158/92  Pulse: 60  Temp: 98.1 F (36.7 C)  TempSrc: Oral  SpO2: 98%  Weight: 277 lb (125.6 kg)  Height: 5\' 1"  (1.549 m)    General: Well developed, well nourished, in no acute distress  Skin : Warm and dry.  Head: Normocephalic and atraumatic  Eyes: Sclera and conjunctiva clear; pupils round and reactive to light; extraocular movements intact  Ears: External normal; canals clear; tympanic membranes normal  Oropharynx: Pink, supple. No suspicious lesions  Neck: Supple without thyromegaly, adenopathy  Lungs: Respirations unlabored; clear to auscultation bilaterally without wheeze, rales, rhonchi  CVS exam: normal rate and regular rhythm.  Neurologic: Alert and oriented; speech intact; face symmetrical; moves all extremities well; CNII-XII intact without focal deficit   Assessment:  1. Essential hypertension, malignant   2. Type II diabetes mellitus with renal manifestations, uncontrolled (HCC)   3.  Situational mixed anxiety and depressive disorder     Plan:  1. Uncontrolled; continue current regimen; add Hydralazine 10 mg bid; follow-up in 3-4 weeks; 2. Update Hgba1c today; plan to follow-up with endocrinologist as scheduled; 3. Responding well to Lexapro- continue working with therapist and psychiatrist; will plan to return to work gradually over the next 4 weeks.   Return in about 26 days (around 05/23/2018).  Orders Placed This Encounter  Procedures  . HgB A1c    Standing Status:   Future    Standing Expiration Date:   04/27/2019    Requested Prescriptions   Signed Prescriptions Disp Refills  .  hydrALAZINE (APRESOLINE) 10 MG tablet 60 tablet 1    Sig: Take 1 tablet (10 mg total) by mouth 2 (two) times daily.

## 2018-04-28 ENCOUNTER — Other Ambulatory Visit (INDEPENDENT_AMBULATORY_CARE_PROVIDER_SITE_OTHER): Payer: 59

## 2018-04-28 DIAGNOSIS — M109 Gout, unspecified: Secondary | ICD-10-CM

## 2018-04-28 DIAGNOSIS — E1129 Type 2 diabetes mellitus with other diabetic kidney complication: Secondary | ICD-10-CM

## 2018-04-28 DIAGNOSIS — E1165 Type 2 diabetes mellitus with hyperglycemia: Secondary | ICD-10-CM

## 2018-04-28 DIAGNOSIS — IMO0002 Reserved for concepts with insufficient information to code with codable children: Secondary | ICD-10-CM

## 2018-04-28 LAB — URIC ACID: Uric Acid, Serum: 9 mg/dL — ABNORMAL HIGH (ref 2.4–7.0)

## 2018-04-28 LAB — HEMOGLOBIN A1C: Hgb A1c MFr Bld: 10.6 % — ABNORMAL HIGH (ref 4.6–6.5)

## 2018-04-28 NOTE — Telephone Encounter (Signed)
Lori Mclaughlin are you aware of the patients daughter needing FMLA to care for her mother? Please advise if okay to complete and frequencies. Thank you.

## 2018-04-29 NOTE — Telephone Encounter (Signed)
LVM for daughter, Lori Mclaughlin [phone] to return our call.  We are needing more inform on why she believes she needs this FMLA to care for her mother. It has not been discuss with provider and patient has returned to work.  (May transfer her to office to speak with [place])

## 2018-04-29 NOTE — Telephone Encounter (Signed)
Agree this is probably not appropriate; can you clarify why the daughter needs this? Her mom is going back to work next week.

## 2018-05-02 ENCOUNTER — Telehealth: Payer: Self-pay | Admitting: Internal Medicine

## 2018-05-02 NOTE — Telephone Encounter (Signed)
If Revonda Standard calls back why does she needs notes. We cannot give without patients permission.   No call back phone number given for allison.

## 2018-05-02 NOTE — Telephone Encounter (Signed)
Copied from [place]. Topic: General - Other >> May 02, 2018 12:32 PM Cecelia Byars, NT wrote: Reason for CRM  Ebony Hail from [employer] called and would like the last 2 office visit notes faxed to  [phone] claim #  [id number]

## 2018-05-05 DIAGNOSIS — E113511 Type 2 diabetes mellitus with proliferative diabetic retinopathy with macular edema, right eye: Secondary | ICD-10-CM | POA: Diagnosis not present

## 2018-05-05 NOTE — Telephone Encounter (Signed)
Spoke with the patient. She states her daughter is needing the FMLA for the patients intermitting leave which is due to renew. That is for 4 days a month. Patient states she does not drive & this is if she needs to go to the doctor or be picked up from work is not doing well.   Patient states [employer] should be sending renewal forms for that.  Please advise on the daughters FMLA, Thank you.

## 2018-05-06 NOTE — Telephone Encounter (Signed)
Okay to complete FMLA as requested; thanks

## 2018-05-09 ENCOUNTER — Encounter: Payer: Self-pay | Admitting: Family

## 2018-05-09 ENCOUNTER — Telehealth: Payer: Self-pay

## 2018-05-09 DIAGNOSIS — Z0279 Encounter for issue of other medical certificate: Secondary | ICD-10-CM

## 2018-05-09 NOTE — Telephone Encounter (Signed)
Letter & OV notes have been faxed to [phone], Patient has been informed.

## 2018-05-09 NOTE — Telephone Encounter (Signed)
Forms have been completed & placed in providers box to review & sign.  °

## 2018-05-09 NOTE — Telephone Encounter (Signed)
Letter you requested is in folder along with daughter's FMLA requests. Thanks-

## 2018-05-09 NOTE — Telephone Encounter (Signed)
Forms signed, Original mailed to [employer], Copy sent to [facility]. Charged for.  Patient informed.

## 2018-05-09 NOTE — Telephone Encounter (Signed)
Copied from [place] [id number]. Topic: General - Other >> May 06, 2018  3:44 PM Yvette Rack wrote: Reason for CRM: Pt states that [employer] is requesting an extension on leave of absence from 04/25/18 to 05/02/18 along with notification that it is ok for pt to gradually return to work for 4 hours per day starting 05/02/18 for 2 weeks. Cb# [phone] >> May 09, 2018  9:27 AM Para Skeans A wrote: Can you get this in a letter?  Would you like for me to complete?

## 2018-05-23 DIAGNOSIS — H43823 Vitreomacular adhesion, bilateral: Secondary | ICD-10-CM | POA: Diagnosis not present

## 2018-05-23 DIAGNOSIS — E113513 Type 2 diabetes mellitus with proliferative diabetic retinopathy with macular edema, bilateral: Secondary | ICD-10-CM | POA: Diagnosis not present

## 2018-05-23 DIAGNOSIS — H35033 Hypertensive retinopathy, bilateral: Secondary | ICD-10-CM | POA: Diagnosis not present

## 2018-05-23 DIAGNOSIS — E113512 Type 2 diabetes mellitus with proliferative diabetic retinopathy with macular edema, left eye: Secondary | ICD-10-CM | POA: Diagnosis not present

## 2018-05-25 ENCOUNTER — Ambulatory Visit: Payer: 59 | Admitting: Family

## 2018-05-25 ENCOUNTER — Encounter: Payer: Self-pay | Admitting: Family

## 2018-05-25 VITALS — BP 170/98 | HR 70 | Temp 98.4°F | Ht 61.0 in | Wt 282.1 lb

## 2018-05-25 DIAGNOSIS — I10 Essential (primary) hypertension: Secondary | ICD-10-CM | POA: Diagnosis not present

## 2018-05-25 DIAGNOSIS — Z1211 Encounter for screening for malignant neoplasm of colon: Secondary | ICD-10-CM | POA: Diagnosis not present

## 2018-05-25 DIAGNOSIS — Z1231 Encounter for screening mammogram for malignant neoplasm of breast: Secondary | ICD-10-CM | POA: Diagnosis not present

## 2018-05-25 NOTE — Progress Notes (Signed)
Lori Mclaughlin is a 57 y.o. female with the following history as recorded in EpicCare:  Patient Active Problem List   Diagnosis Date Noted  . 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 Myalgia 03/10/2017  . Screen for colon cancer 06/12/2015  . Visit for screening mammogram 06/12/2015  . Hypersomnia 04/11/2014  . Morbid obesity (HCC) 04/11/2014  . Bradycardia, drug induced 04/11/2014  . Chronic diastolic CHF (congestive heart failure), NYHA class 2 (HCC) 03/30/2014  . Low back pain radiating to left leg 01/26/2012  . Sleep apnea 11/13/2011  . Hyperlipidemia with target LDL less than 70 01/14/2011  . Type II diabetes mellitus with renal manifestations, uncontrolled (HCC) 08/05/2009  . Deficiency anemia 08/05/2009  . Essential hypertension, malignant 08/05/2009  . ALLERGIC RHINITIS 08/05/2009  . ASTHMA 08/05/2009  . GERD 08/05/2009  . OSTEOARTHRITIS 08/05/2009    Current Outpatient Medications  Medication Sig Dispense Refill  . allopurinol (ZYLOPRIM) 300 MG tablet Take 1 tablet (300 mg total) by mouth daily. 90 tablet 1  . amLODipine (NORVASC) 10 MG tablet Take 1 tablet (10 mg total) by mouth daily. 90 tablet 1  . aspirin EC 81 MG EC tablet Take 1 tablet (81 mg total) by mouth daily.    . chlorthalidone (HYGROTON) 25 MG tablet Take 1 tablet (25 mg total) by mouth daily. 90 tablet 1  . escitalopram (LEXAPRO) 10 MG tablet Take 1 tablet (10 mg total) by mouth daily. 90 tablet 1  . fluticasone (FLONASE) 50 MCG/ACT nasal spray Place 2 sprays into both nostrils daily. (Patient taking differently: Place 2 sprays into both nostrils daily as needed for allergies. ) 16 g 6  . glucose blood (ONETOUCH VERIO) test strip Use TID 300 each 1  . hydrALAZINE (APRESOLINE) 10 MG tablet Take 1 tablet (10 mg total) by mouth 2 (two) times daily. 60 tablet 1  . HYDROcodone-acetaminophen (NORCO) 5-325 MG tablet Take 1 tablet by mouth every 6 (six) hours as needed for moderate pain. 20 tablet 0  . indomethacin  (INDOCIN) 50 MG capsule Take 1 capsule (50 mg total) by mouth 3 (three) times daily as needed for moderate pain. 90 capsule 0  . Insulin Detemir (LEVEMIR FLEXTOUCH) 100 UNIT/ML Pen Inject 80 Units into the skin every morning. And pen needles 1/day 45 mL 3  . Lancets (ONETOUCH ULTRASOFT) lancets Use as instructed to test 3 times daily 300 each 1  . Nebivolol HCl 20 MG TABS Take 1 tablet (20 mg total) by mouth daily. 90 tablet 1  . sitaGLIPtin-metformin (JANUMET) 50-1000 MG tablet Take 1 tablet by mouth 2 (two) times daily with a meal. 180 tablet 1  . telmisartan (MICARDIS) 80 MG tablet Take 1 tablet (80 mg total) by mouth daily. 90 tablet 1  . thiamine (VITAMIN B-1) 100 MG tablet Take 1 tablet (100 mg total) by mouth daily. 90 tablet 1   No current facility-administered medications for this visit.     Allergies: Latex and Lisinopril  Past Medical History:  Diagnosis Date  . Allergic rhinitis   . Asthma   . Atypical chest pain    a. 03/2014: normal nuclear stress test.  . Chronic diastolic CHF (congestive heart failure) (HCC)    a. Dx 03/2014 with echo - moderate LVH, EF 55-60%, grade 1 diastolic dysfunction, mildly dilated LA.  . CKD (chronic kidney disease), stage II   . Diabetes mellitus type II   . GERD (gastroesophageal reflux disease)   . Headache(784.0)    when my bp is up  .  Hypertension   . Hypertensive heart disease   . Microcytic anemia   . Osteoarthritis     Past Surgical History:  Procedure Laterality Date  . SHOULDER ARTHROSCOPY Bilateral   . TUBAL LIGATION      Family History  Problem Relation Age of Onset  . Stroke Mother   . Cancer Father   . Hypertension Brother   . Diabetes Other   . Hypertension Other   . Stroke Other   . Cancer Other        colon; prostate  . Kidney disease Neg Hx   . Alcohol abuse Neg Hx   . Asthma Neg Hx   . COPD Neg Hx   . Depression Neg Hx   . Drug abuse Neg Hx   . Early death Neg Hx   . Hearing loss Neg Hx   . Heart disease  Neg Hx   . Hyperlipidemia Neg Hx     Social History   Tobacco Use  . Smoking status: Former Smoker    Last attempt to quit: 08/29/2010    Years since quitting: 7.7  . Smokeless tobacco: Never Used  Substance Use Topics  . Alcohol use: No    Alcohol/week: 5.0 standard drinks    Types: 5 Glasses of wine per week    Comment: quit 4 years    Subjective:  Follow-up on hypertension; continuing to be uncontrolled; now taking Micardis, Amlodipine, Hydralazine, Bystolic, Chlorthalidone; admits that stress level is quite high- having difficult time getting her recent extension of short-term disability approved; still working with psychiatrist- will be doing follow-up once per month;  Also need to review/ discuss preventive care needs;    Objective:  Vitals:   05/25/18 1104  BP: (!) 170/98  Pulse: 70  Temp: 98.4 F (36.9 C)  TempSrc: Oral  SpO2: 98%  Weight: 282 lb 1.9 oz (128 kg)  Height: 5\' 1"  (1.549 m)    General: Well developed, well nourished, in no acute distress  Skin : Warm and dry.  Head: Normocephalic and atraumatic  Lungs: Respirations unlabored; clear to auscultation bilaterally without wheeze, rales, rhonchi  CVS exam: normal rate and regular rhythm.  Neurologic: Alert and oriented; speech intact; face symmetrical; moves all extremities well; CNII-XII intact without focal deficit   Assessment:  1. Hypertension, malignant   2. Screening mammogram, encounter for     Plan:  1. Uncontrolled; in fact, no change noted with addition of more medication; will refer to hypertension clinic for further evaluation; follow-up to be determined.  2. Order for screening mammogram updated; She defers colonoscopy- agrees to Cologuard- order updated; will plan to do pap smear at later date. Reminded patient that she needs to see her endocrinologist in follow up about her diabetes- explained that I am not managing for her even though I checked recent Hgba1c.   No follow-ups on file.   Orders Placed This Encounter  Procedures  . MM Digital Screening    INS: [employer]? PF: 09/12/10 BCG?/  ? NEEDS/ ? IMPLANTS/ ? HX OF BR CA    Standing Status:   Future    Standing Expiration Date:   07/26/2019    Order Specific Question:   Reason for Exam (SYMPTOM  OR DIAGNOSIS REQUIRED)    Answer:   screening mammogram    Order Specific Question:   Is the patient pregnant?    Answer:   No    Order Specific Question:   Preferred imaging location?    Answer:  [facility]  . Ambulatory referral to Cardiology    Referral Priority:   Routine    Referral Type:   Consultation    Referral Reason:   Specialty Services Required    Requested Specialty:   Cardiology    Number of Visits Requested:   1    Requested Prescriptions    No prescriptions requested or ordered in this encounter

## 2018-05-31 NOTE — Addendum Note (Signed)
Addended by: Karma Ganja on: 05/31/2018 04:53 PM   Modules accepted: Orders

## 2018-06-06 ENCOUNTER — Other Ambulatory Visit: Payer: Self-pay

## 2018-06-06 DIAGNOSIS — E113511 Type 2 diabetes mellitus with proliferative diabetic retinopathy with macular edema, right eye: Secondary | ICD-10-CM | POA: Diagnosis not present

## 2018-06-06 MED ORDER — ACCU-CHEK GUIDE W/DEVICE KIT: 1.0000 | PACK | Freq: Two times a day (BID) | 0 refills | Status: DC

## 2018-06-06 MED ORDER — ACCU-CHEK SOFT TOUCH LANCETS MISC: 12 refills | Status: DC

## 2018-06-06 MED ORDER — GLUCOSE BLOOD VI STRP: ORAL_STRIP | 12 refills | Status: DC

## 2018-06-06 NOTE — Telephone Encounter (Signed)
Received notification from [facility] stating pt desires AccuChek kit, lancets and strips. Approved by Dr. Loanne Drilling. Rx's sent today.

## 2018-06-09 ENCOUNTER — Telehealth: Payer: Self-pay | Admitting: Internal Medicine

## 2018-06-09 NOTE — Telephone Encounter (Signed)
I have received via fax a Capabilities &Limitation Form from [employer] for medical accommodations.    Patient had a return to work date full time on 11/4.  I am unsure what she needs on this form? Is this for when she was out and working part time or what?   LVM for patient to call back to get this information.

## 2018-06-10 NOTE — Telephone Encounter (Signed)
Spoke with patient. This is not for anything new. Her job just needs these forms completed now for the same reason she already has FMLA.

## 2018-06-14 NOTE — Telephone Encounter (Signed)
Forms signed, faxed, copy sent to scan.   Original mailed to patient.

## 2018-07-01 NOTE — Progress Notes (Deleted)
Cardiology Office Note   Date:  07/01/2018   ID:  Lori Mclaughlin, DOB 06/25/1961, MRN [id number]  PCP:  Lori Lima, MD  Cardiologist:   Lori Rouge, MD   No chief complaint on file.     History of Present Illness: Lori Mclaughlin is a 57 y.o. female who presents for consultation regarding HTN. Referred by Dr Lori Mclaughlin Last seen by Dr Lori Mclaughlin 2015 Long standing HTN exacerbated by anxiety and stress. Did have normal renal Duplex with no RAS in 2016  Currently concerned about Short term disability. Currently on amlodipine, hydralazine, bystolic and Chlorthalidone. She also has DM-2. Very poorly controlled with last A1c 10.6 04/28/18 but was as high as 14 9 months ago  Baseline Cr is 1.6    2015  Had atypical chest pain and diastolic CHF. TTE with only grade one dysfunction and normal EF. Normal myovue   And r/o.   ***  Past Medical History:  Diagnosis Date  . Allergic rhinitis   . Asthma   . Atypical chest pain    a. 03/2014: normal nuclear stress test.  . Chronic diastolic CHF (congestive heart failure) ([place])    a. Dx 03/2014 with echo - moderate LVH, EF 00-86%, grade 1 diastolic dysfunction, mildly dilated LA.  . CKD (chronic kidney disease), stage II   . Diabetes mellitus type II   . GERD (gastroesophageal reflux disease)   . Headache(784.0)    when my bp is up  . Hypertension   . Hypertensive heart disease   . Microcytic anemia   . Osteoarthritis     Past Surgical History:  Procedure Laterality Date  . SHOULDER ARTHROSCOPY Bilateral   . TUBAL LIGATION       Current Outpatient Medications  Medication Sig Dispense Refill  . allopurinol (ZYLOPRIM) 300 MG tablet Take 1 tablet (300 mg total) by mouth daily. 90 tablet 1  . amLODipine (NORVASC) 10 MG tablet Take 1 tablet (10 mg total) by mouth daily. 90 tablet 1  . aspirin EC 81 MG EC tablet Take 1 tablet (81 mg total) by mouth daily.    . Blood Glucose Monitoring Suppl (ACCU-CHEK GUIDE) w/Device KIT 1  each by Does not apply route 2 (two) times daily. Use to monitor blood glucose level twice per day 1 kit 0  . chlorthalidone (HYGROTON) 25 MG tablet Take 1 tablet (25 mg total) by mouth daily. 90 tablet 1  . escitalopram (LEXAPRO) 10 MG tablet Take 1 tablet (10 mg total) by mouth daily. 90 tablet 1  . fluticasone (FLONASE) 50 MCG/ACT nasal spray Place 2 sprays into both nostrils daily. (Patient taking differently: Place 2 sprays into both nostrils daily as needed for allergies. ) 16 g 6  . glucose blood test strip Use to monitor blood glucose level twice per day 100 each 12  . hydrALAZINE (APRESOLINE) 10 MG tablet Take 1 tablet (10 mg total) by mouth 2 (two) times daily. 60 tablet 1  . HYDROcodone-acetaminophen (NORCO) 5-325 MG tablet Take 1 tablet by mouth every 6 (six) hours as needed for moderate pain. 20 tablet 0  . indomethacin (INDOCIN) 50 MG capsule Take 1 capsule (50 mg total) by mouth 3 (three) times daily as needed for moderate pain. 90 capsule 0  . Insulin Detemir (LEVEMIR FLEXTOUCH) 100 UNIT/ML Pen Inject 80 Units into the skin every morning. And pen needles 1/day 45 mL 3  . Lancets (ACCU-CHEK SOFT TOUCH) lancets Use to monitor blood glucose level twice per  day 100 each 12  . Nebivolol HCl 20 MG TABS Take 1 tablet (20 mg total) by mouth daily. 90 tablet 1  . sitaGLIPtin-metformin (JANUMET) 50-1000 MG tablet Take 1 tablet by mouth 2 (two) times daily with a meal. 180 tablet 1  . telmisartan (MICARDIS) 80 MG tablet Take 1 tablet (80 mg total) by mouth daily. 90 tablet 1  . thiamine (VITAMIN B-1) 100 MG tablet Take 1 tablet (100 mg total) by mouth daily. 90 tablet 1   No current facility-administered medications for this visit.     Allergies:   Latex and Lisinopril    Social History:  The patient  reports that she quit smoking about 7 years ago. She has never used smokeless tobacco. She reports that she does not drink alcohol or use drugs.   Family History:  The patient's family  history includes Cancer in her father and other; Diabetes in her other; Hypertension in her brother and other; Stroke in her mother and other.    ROS:  Please see the history of present illness.   Otherwise, review of systems are positive for none.   All other systems are reviewed and negative.    PHYSICAL EXAM: VS:  LMP 02/23/2012  , BMI There is no height or weight on file to calculate BMI. Affect appropriate Healthy:  appears stated age 9: normal Neck supple with no adenopathy JVP normal no bruits no thyromegaly Lungs clear with no wheezing and good diaphragmatic motion Heart:  S1/S2 no murmur, no rub, gallop or click PMI normal Abdomen: benighn, BS positve, no tenderness, no AAA no bruit.  No HSM or HJR Distal pulses intact with no bruits No edema Neuro non-focal Skin warm and dry No muscular weakness    EKG:  08/25/17  NSR rate 94 nonspecific ST changes   Recent Labs: 01/11/2018: ALT 14 01/13/2018: Hemoglobin 12.4; Platelets 259 03/23/2018: BUN 31; Creatinine, Ser 1.43; Potassium 4.1; Sodium 138    Lipid Panel    Component Value Date/Time   CHOL 152 03/10/2017 1135   TRIG 120.0 03/10/2017 1135   HDL 44.60 03/10/2017 1135   CHOLHDL 3 03/10/2017 1135   VLDL 24.0 03/10/2017 1135   LDLCALC 83 03/10/2017 1135      Wt Readings from Last 3 Encounters:  05/25/18 282 lb 1.9 oz (128 kg)  04/27/18 277 lb (125.6 kg)  03/23/18 278 lb (126.1 kg)      Other studies Reviewed: Additional studies/ records that were reviewed today include: Notes Dr Lori Mclaughlin and primary TTE, myovue 2015 Renal duplex labs .    ASSESSMENT AND PLAN:  1.  HTN: Obesity, poorly controlled DM, diet , lack of exercise all contributing as well as her anxiety. *** 2.  DM:  Discussed low carb diet.  Target hemoglobin A1c is 6.5 or less.  Continue current medications. 3. Anxiety/Depression; continue lexapro f/u primary  4. CRF:  Related to HTN/DM discussed risk of dialysis if she does not improve  her glucose control    Current medicines are reviewed at length with the patient today.  The patient does not have concerns regarding medicines.  The following changes have been made:  ***  Labs/ tests ordered today include: *** No orders of the defined types were placed in this encounter.    Disposition:   FU with Dr Lori Mclaughlin      Signed, Lori Rouge, MD  07/01/2018 4:58 PM    [address], [place], [place]  [address]  Phone: [phone]; Fax: [phone]

## 2018-07-04 ENCOUNTER — Ambulatory Visit: Payer: 59 | Admitting: Cardiovascular Disease

## 2018-07-05 ENCOUNTER — Encounter: Payer: Self-pay | Admitting: Cardiovascular Disease

## 2018-07-07 ENCOUNTER — Ambulatory Visit: Payer: Self-pay

## 2018-08-12 DIAGNOSIS — E113512 Type 2 diabetes mellitus with proliferative diabetic retinopathy with macular edema, left eye: Secondary | ICD-10-CM | POA: Diagnosis not present

## 2018-09-01 NOTE — Progress Notes (Signed)
This encounter was created in error - please disregard.

## 2018-09-05 DIAGNOSIS — H3582 Retinal ischemia: Secondary | ICD-10-CM | POA: Diagnosis not present

## 2018-09-05 DIAGNOSIS — E113511 Type 2 diabetes mellitus with proliferative diabetic retinopathy with macular edema, right eye: Secondary | ICD-10-CM | POA: Diagnosis not present

## 2018-09-05 DIAGNOSIS — E113513 Type 2 diabetes mellitus with proliferative diabetic retinopathy with macular edema, bilateral: Secondary | ICD-10-CM | POA: Diagnosis not present

## 2018-09-05 DIAGNOSIS — H35033 Hypertensive retinopathy, bilateral: Secondary | ICD-10-CM | POA: Diagnosis not present

## 2018-09-05 DIAGNOSIS — H43821 Vitreomacular adhesion, right eye: Secondary | ICD-10-CM | POA: Diagnosis not present

## 2018-09-05 IMAGING — CR DG CHEST 2V
2 series · 2 of 2 positions shown · non-contrast
Comparison: 06/12/2015

CLINICAL DATA: Cough, cold, chest tightness

EXAM:
CHEST  2 VIEW

[w chest pa]
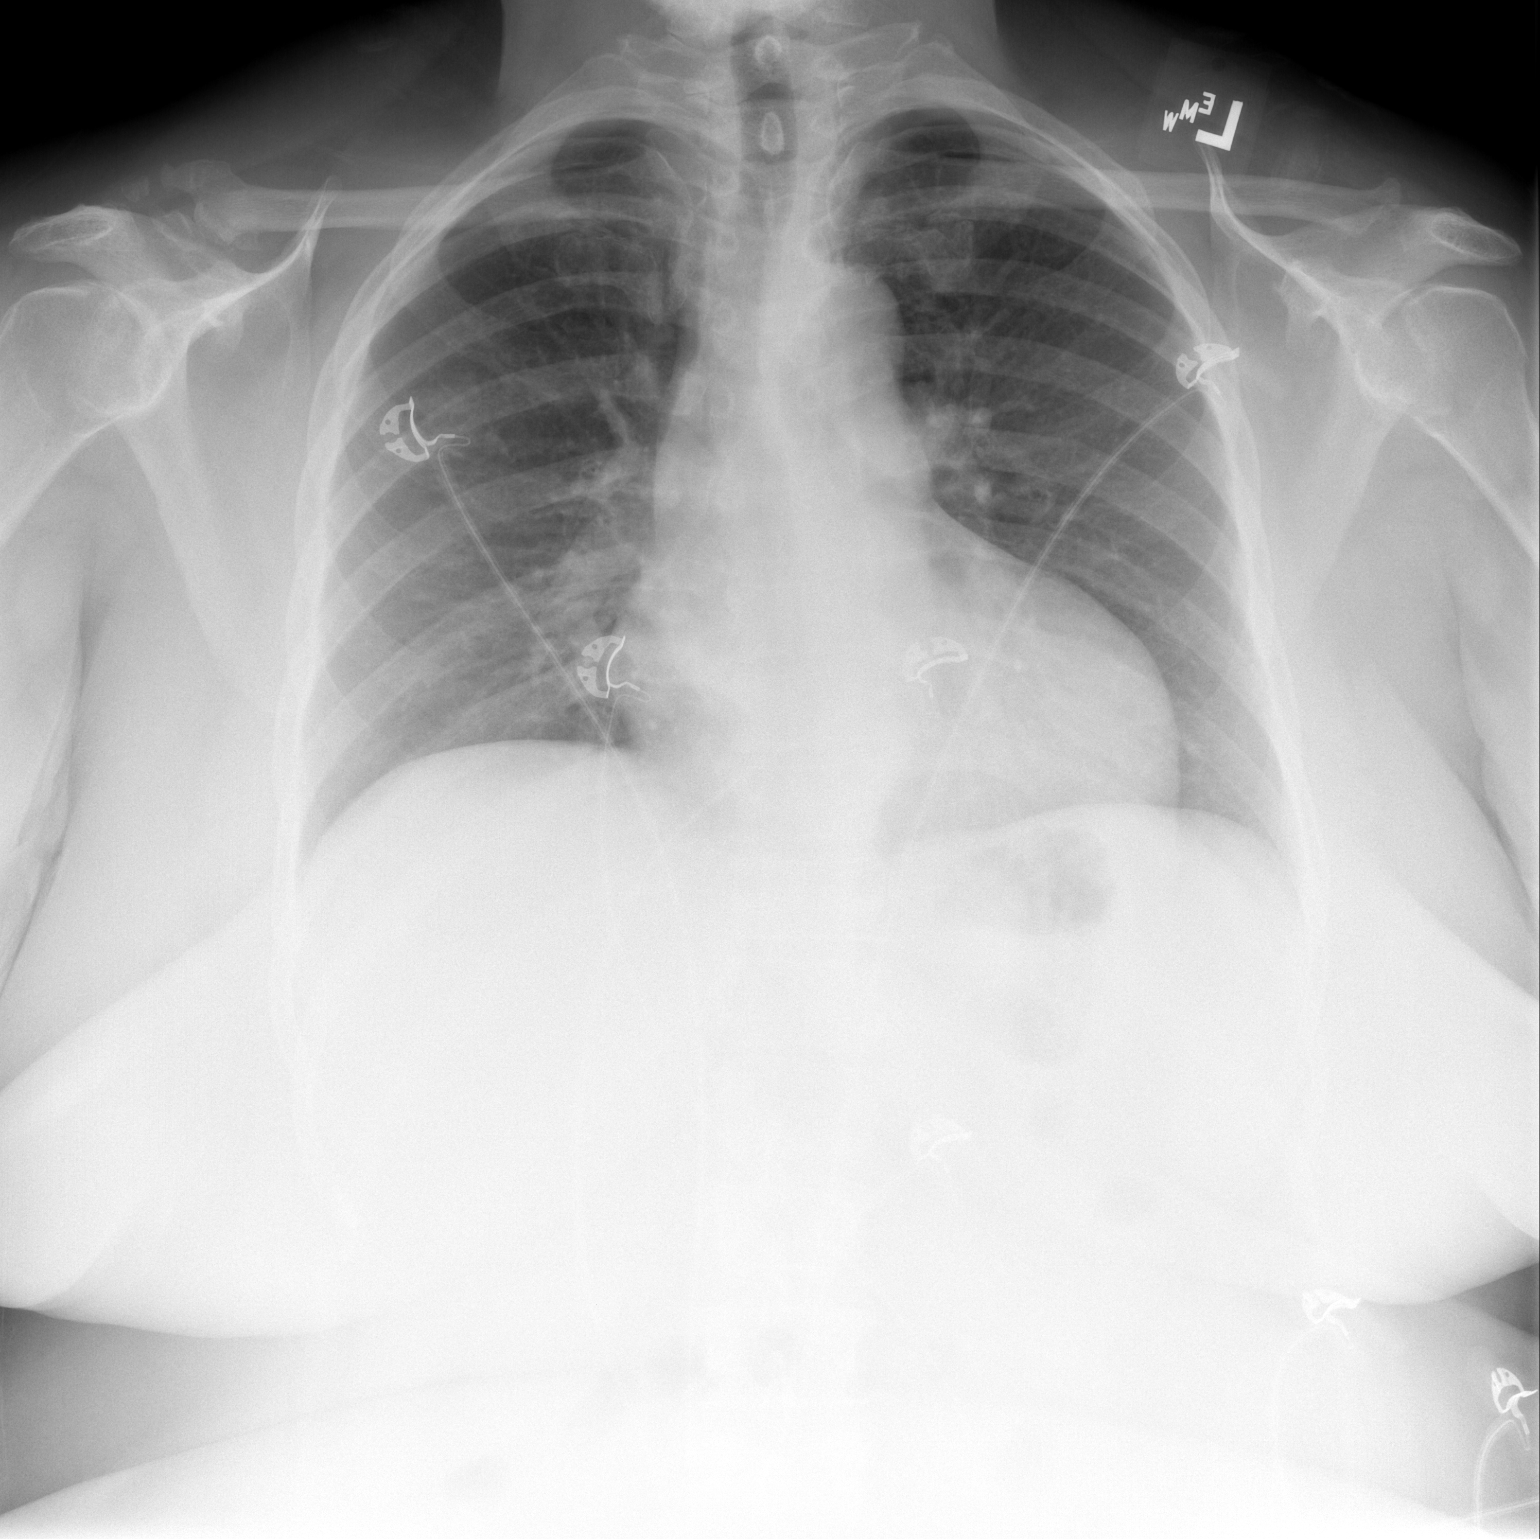

[w chest lat]
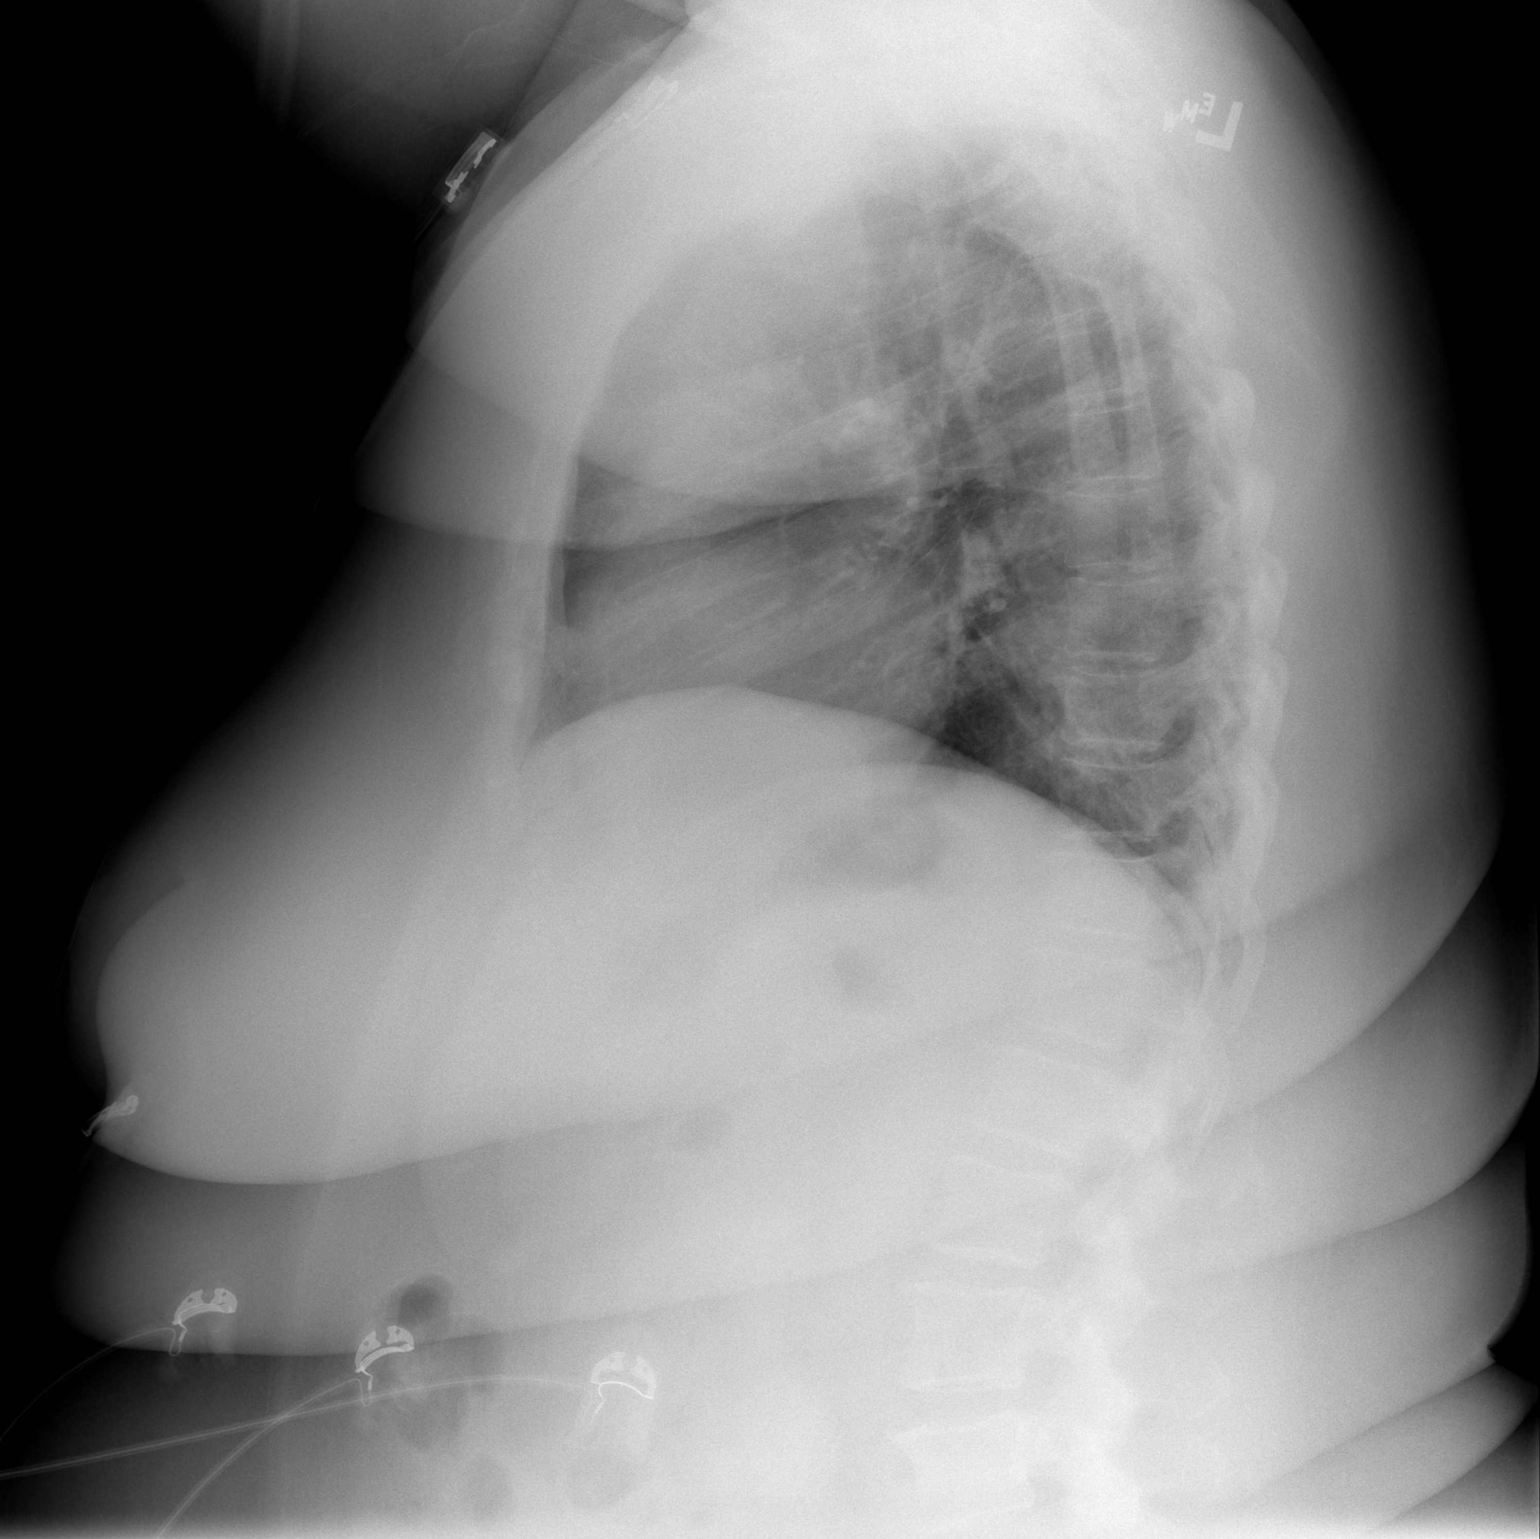

[2 of 2 positions shown; findings below may reference images not displayed]

FINDINGS: The heart size and mediastinal contours are within normal limits.
Both lungs are clear. The visualized skeletal structures are
unremarkable.
IMPRESSION: No active cardiopulmonary disease.

## 2018-09-16 ENCOUNTER — Ambulatory Visit: Payer: Self-pay | Admitting: Family

## 2018-09-30 ENCOUNTER — Ambulatory Visit: Payer: Self-pay | Admitting: Family

## 2018-10-02 ENCOUNTER — Other Ambulatory Visit: Payer: Self-pay | Admitting: Family

## 2018-10-02 DIAGNOSIS — Z794 Long term (current) use of insulin: Secondary | ICD-10-CM

## 2018-10-02 DIAGNOSIS — I10 Essential (primary) hypertension: Secondary | ICD-10-CM

## 2018-10-02 DIAGNOSIS — E1121 Type 2 diabetes mellitus with diabetic nephropathy: Secondary | ICD-10-CM

## 2018-10-02 DIAGNOSIS — E1165 Type 2 diabetes mellitus with hyperglycemia: Secondary | ICD-10-CM

## 2018-10-02 DIAGNOSIS — IMO0002 Reserved for concepts with insufficient information to code with codable children: Secondary | ICD-10-CM

## 2018-10-03 ENCOUNTER — Other Ambulatory Visit: Payer: Self-pay | Admitting: Family

## 2018-10-03 DIAGNOSIS — I10 Essential (primary) hypertension: Secondary | ICD-10-CM

## 2018-10-03 DIAGNOSIS — IMO0002 Reserved for concepts with insufficient information to code with codable children: Secondary | ICD-10-CM

## 2018-10-03 DIAGNOSIS — Z794 Long term (current) use of insulin: Secondary | ICD-10-CM

## 2018-10-03 DIAGNOSIS — E1121 Type 2 diabetes mellitus with diabetic nephropathy: Secondary | ICD-10-CM

## 2018-10-03 DIAGNOSIS — E1165 Type 2 diabetes mellitus with hyperglycemia: Secondary | ICD-10-CM

## 2018-10-03 MED ORDER — SITAGLIPTIN PHOS-METFORMIN HCL 50-1000 MG PO TABS: 1.0000 | ORAL_TABLET | Freq: Two times a day (BID) | ORAL | 1 refills | Status: DC

## 2018-10-03 MED ORDER — CHLORTHALIDONE 25 MG PO TABS: 25.0000 mg | ORAL_TABLET | Freq: Every day | ORAL | 1 refills | Status: DC

## 2018-10-03 NOTE — Telephone Encounter (Signed)
Dr. Yetta Barre has not seen pt since 08/2017. But she has seen Lori Mclaughlin several times. Is pt switching providers? Can you call and find out?

## 2018-10-03 NOTE — Telephone Encounter (Signed)
I am not sure how to help her with her endocrinology concerns. She has been working with Dr. Everardo All and we had a conversation about that when she was last here. She can call that office and see if she can change to a different provider. With the complexity of her diabetes, she needs to be under the care of an endocrinologist.  As far as her blood pressure, I see that she no-showed about the hypertension clinic. I really need her to see them in order to get her blood pressure controlled.   I will give her #30 day refill on her blood pressure medications. She no-showed her appointment here on Friday. We have not seen here since the end of October.   I need her to follow-up on her chronic needs as have previously been recommended in order to make sure we are taking the best possible care of her.

## 2018-10-03 NOTE — Telephone Encounter (Signed)
Pt does not see Everardo All, she does not like that office, please advise on refills

## 2018-10-03 NOTE — Telephone Encounter (Signed)
Patient would like to transfer from [place] to [place], please advise if okay and please advise on refills

## 2018-10-03 NOTE — Telephone Encounter (Signed)
Please let her know that regardless of who is managing her primary care needs, she must see Dr. Everardo All about her diabetes. That was not being managed by Dr. Yetta Barre and I won't be doing that for her either.

## 2018-10-04 NOTE — Telephone Encounter (Signed)
Spoke with patient and info given 

## 2018-11-28 DIAGNOSIS — E113512 Type 2 diabetes mellitus with proliferative diabetic retinopathy with macular edema, left eye: Secondary | ICD-10-CM | POA: Diagnosis not present

## 2018-11-28 DIAGNOSIS — E113511 Type 2 diabetes mellitus with proliferative diabetic retinopathy with macular edema, right eye: Secondary | ICD-10-CM | POA: Diagnosis not present

## 2018-11-28 DIAGNOSIS — E113513 Type 2 diabetes mellitus with proliferative diabetic retinopathy with macular edema, bilateral: Secondary | ICD-10-CM | POA: Diagnosis not present

## 2018-12-05 ENCOUNTER — Telehealth: Payer: Self-pay | Admitting: Endocrinology

## 2018-12-05 NOTE — Telephone Encounter (Signed)
Called pt and scheduled her for appt 12/12/18 to further discuss

## 2018-12-05 NOTE — Telephone Encounter (Signed)
Patient called to advise that as of January 25, 2019 her insurance will not cover her Janumet, but it will cover Marica Otter, and Jentadueto XR   Please call patient to advise [phone].  MyChart

## 2018-12-05 NOTE — Telephone Encounter (Signed)
Please advise 

## 2018-12-05 NOTE — Telephone Encounter (Signed)
F/u is due.  Let's address then 

## 2018-12-12 ENCOUNTER — Ambulatory Visit: Payer: 59 | Admitting: Endocrinology

## 2018-12-15 ENCOUNTER — Encounter: Payer: Self-pay | Admitting: Endocrinology

## 2018-12-16 ENCOUNTER — Ambulatory Visit (INDEPENDENT_AMBULATORY_CARE_PROVIDER_SITE_OTHER): Payer: 59 | Admitting: Endocrinology

## 2018-12-16 ENCOUNTER — Other Ambulatory Visit: Payer: Self-pay

## 2018-12-16 DIAGNOSIS — E1165 Type 2 diabetes mellitus with hyperglycemia: Secondary | ICD-10-CM

## 2018-12-16 DIAGNOSIS — E1129 Type 2 diabetes mellitus with other diabetic kidney complication: Secondary | ICD-10-CM | POA: Diagnosis not present

## 2018-12-16 DIAGNOSIS — IMO0002 Reserved for concepts with insufficient information to code with codable children: Secondary | ICD-10-CM

## 2018-12-16 MED ORDER — SAXAGLIPTIN-METFORMIN ER 2.5-1000 MG PO TB24: 2.0000 | ORAL_TABLET | ORAL | 3 refills | Status: DC

## 2018-12-16 NOTE — Progress Notes (Signed)
Subjective:    Patient ID: Lori Mclaughlin, female    DOB: 1960/10/18, 58 y.o.   MRN: [id number]  HPI  telehealth visit today via doxy video visit.  Alternatives to telehealth are presented to this patient, and the patient agrees to the telehealth visit. Pt is advised of the cost of the visit, and agrees to this, also.   Patient is at home, and I am at the office.   Persons attending the telehealth visit: the patient and I Pt returns for f/u of diabetes mellitus: DM type: Insulin-requiring type 2.   Dx'ed: 0867 Complications: DR and renal insuff  Therapy: insulin since 2018 GDM: 1984 and 1987 DKA: never Severe hypoglycemia: never Pancreatitis: never Pancreatic imaging: never Other: she takes qd insulin, after poor results with multiple daily injections. Interval history: Ins wants to change janumet to kombyglyze.  She says cbg varies from 97-300.  It is in general higher as the day goes on.  She often misses the insulin--she takes qhs.   Past Medical History:  Diagnosis Date  . Allergic rhinitis   . Asthma   . Atypical chest pain    a. 03/2014: normal nuclear stress test.  . Chronic diastolic CHF (congestive heart failure) ([place])    a. Dx 03/2014 with echo - moderate LVH, EF 61-95%, grade 1 diastolic dysfunction, mildly dilated LA.  . CKD (chronic kidney disease), stage II   . Diabetes mellitus type II   . GERD (gastroesophageal reflux disease)   . Headache(784.0)    when my bp is up  . Hypertension   . Hypertensive heart disease   . Microcytic anemia   . Osteoarthritis     Past Surgical History:  Procedure Laterality Date  . SHOULDER ARTHROSCOPY Bilateral   . TUBAL LIGATION      Social History   Socioeconomic History  . Marital status: Divorced    Spouse name: Not on file  . Number of children: Not on file  . Years of education: Not on file  . Highest education level: Not on file  Occupational History  . Not on file  Social Needs  . Financial resource  strain: Not on file  . Food insecurity:    Worry: Not on file    Inability: Not on file  . Transportation needs:    Medical: Not on file    Non-medical: Not on file  Tobacco Use  . Smoking status: Former Smoker    Last attempt to quit: 08/29/2010    Years since quitting: 8.3  . Smokeless tobacco: Never Used  Substance and Sexual Activity  . Alcohol use: No    Alcohol/week: 5.0 standard drinks    Types: 5 Glasses of wine per week    Comment: quit 4 years  . Drug use: No  . Sexual activity: Yes    Birth control/protection: Surgical  Lifestyle  . Physical activity:    Days per week: Not on file    Minutes per session: Not on file  . Stress: Not on file  Relationships  . Social connections:    Talks on phone: Not on file    Gets together: Not on file    Attends religious service: Not on file    Active member of club or organization: Not on file    Attends meetings of clubs or organizations: Not on file    Relationship status: Not on file  . Intimate partner violence:    Fear of current or ex partner: Not on  file    Emotionally abused: Not on file    Physically abused: Not on file    Forced sexual activity: Not on file  Other Topics Concern  . Not on file  Social History Narrative   Regular Exercise- yes    Current Outpatient Medications on File Prior to Visit  Medication Sig Dispense Refill  . allopurinol (ZYLOPRIM) 300 MG tablet Take 1 tablet (300 mg total) by mouth daily. 90 tablet 1  . amLODipine (NORVASC) 10 MG tablet Take 1 tablet (10 mg total) by mouth daily. 90 tablet 1  . aspirin EC 81 MG EC tablet Take 1 tablet (81 mg total) by mouth daily.    . Blood Glucose Monitoring Suppl (ACCU-CHEK GUIDE) w/Device KIT 1 each by Does not apply route 2 (two) times daily. Use to monitor blood glucose level twice per day 1 kit 0  . chlorthalidone (HYGROTON) 25 MG tablet Take 1 tablet (25 mg total) by mouth daily. 30 tablet 1  . escitalopram (LEXAPRO) 10 MG tablet Take 1 tablet  (10 mg total) by mouth daily. 90 tablet 1  . fluticasone (FLONASE) 50 MCG/ACT nasal spray Place 2 sprays into both nostrils daily. (Patient taking differently: Place 2 sprays into both nostrils daily as needed for allergies. ) 16 g 6  . glucose blood test strip Use to monitor blood glucose level twice per day 100 each 12  . hydrALAZINE (APRESOLINE) 10 MG tablet Take 1 tablet (10 mg total) by mouth 2 (two) times daily. 60 tablet 1  . HYDROcodone-acetaminophen (NORCO) 5-325 MG tablet Take 1 tablet by mouth every 6 (six) hours as needed for moderate pain. 20 tablet 0  . indomethacin (INDOCIN) 50 MG capsule Take 1 capsule (50 mg total) by mouth 3 (three) times daily as needed for moderate pain. 90 capsule 0  . Insulin Detemir (LEVEMIR FLEXTOUCH) 100 UNIT/ML Pen Inject 80 Units into the skin every morning. And pen needles 1/day 45 mL 3  . Lancets (ACCU-CHEK SOFT TOUCH) lancets Use to monitor blood glucose level twice per day 100 each 12  . Nebivolol HCl 20 MG TABS Take 1 tablet (20 mg total) by mouth daily. 90 tablet 1  . telmisartan (MICARDIS) 80 MG tablet Take 1 tablet (80 mg total) by mouth daily. 90 tablet 1  . thiamine (VITAMIN B-1) 100 MG tablet Take 1 tablet (100 mg total) by mouth daily. 90 tablet 1   No current facility-administered medications on file prior to visit.     Allergies  Allergen Reactions  . Latex     REACTION: itching,rash  . Lisinopril     REACTION: headache    Family History  Problem Relation Age of Onset  . Stroke Mother   . Cancer Father   . Hypertension Brother   . Diabetes Other   . Hypertension Other   . Stroke Other   . Cancer Other        colon; prostate  . Kidney disease Neg Hx   . Alcohol abuse Neg Hx   . Asthma Neg Hx   . COPD Neg Hx   . Depression Neg Hx   . Drug abuse Neg Hx   . Early death Neg Hx   . Hearing loss Neg Hx   . Heart disease Neg Hx   . Hyperlipidemia Neg Hx     LMP 02/23/2012    Review of Systems She denies hypoglycemia.      Objective:   Physical Exam  Lab Results  Component  Value Date   CREATININE 1.43 (H) 03/23/2018   BUN 31 (H) 03/23/2018   NA 138 03/23/2018   K 4.1 03/23/2018   CL 104 03/23/2018   CO2 27 03/23/2018       Assessment & Plan:  Insulin-requiring type 2 DM, with DR: uncertain glycemic control Renal insuff: in this context, she may need a faster-acting qd insulin, but we'll address a1c and compliance first Noncompliance with insulin: I told pt addressing this is the next step in her DM mgmt.  Patient Instructions  I have sent a prescription to your pharmacy, to change janumet to [facility].   Please come in for a1c.  You should change the insulin to the morning.  Please come back for a follow-up appointment in 2 months.

## 2018-12-16 NOTE — Patient Instructions (Addendum)
I have sent a prescription to your pharmacy, to change janumet to [facility].   Please come in for a1c.  You should change the insulin to the morning.  Please come back for a follow-up appointment in 2 months.

## 2018-12-20 ENCOUNTER — Telehealth: Payer: Self-pay

## 2018-12-20 NOTE — Telephone Encounter (Signed)
LOV 12/16/18. Per Dr. Everardo All, f/u appt needed in 2 mo. LVM requesting returned call.

## 2018-12-23 ENCOUNTER — Ambulatory Visit: Payer: 59

## 2019-01-26 ENCOUNTER — Telehealth: Payer: Self-pay

## 2019-01-30 ENCOUNTER — Other Ambulatory Visit: Payer: Self-pay | Admitting: Family

## 2019-01-30 DIAGNOSIS — I10 Essential (primary) hypertension: Secondary | ICD-10-CM

## 2019-01-30 MED ORDER — CHLORTHALIDONE 25 MG PO TABS: 25.0000 mg | ORAL_TABLET | Freq: Every day | ORAL | 0 refills | Status: DC

## 2019-01-30 MED ORDER — AMLODIPINE BESYLATE 10 MG PO TABS
10.0000 mg | ORAL_TABLET | Freq: Every day | ORAL | 0 refills | Status: DC
Start: 2019-01-30 — End: 2019-02-27

## 2019-01-30 MED ORDER — NEBIVOLOL HCL 20 MG PO TABS: 20.0000 mg | ORAL_TABLET | Freq: Every day | ORAL | 0 refills | Status: DC

## 2019-01-30 NOTE — Telephone Encounter (Signed)
Spoke with patient and appointment made for 02/06/19

## 2019-01-30 NOTE — Telephone Encounter (Signed)
I will send in refills on her blood pressure medication one more time; she must be seen for OV;  Can you please call and check on her?

## 2019-02-06 ENCOUNTER — Ambulatory Visit: Payer: 59 | Admitting: Family

## 2019-02-06 DIAGNOSIS — Z0289 Encounter for other administrative examinations: Secondary | ICD-10-CM

## 2019-02-17 ENCOUNTER — Ambulatory Visit: Payer: 59 | Admitting: Endocrinology

## 2019-02-27 ENCOUNTER — Encounter: Payer: Self-pay | Admitting: Family

## 2019-02-27 ENCOUNTER — Other Ambulatory Visit: Payer: Self-pay

## 2019-02-27 ENCOUNTER — Other Ambulatory Visit (INDEPENDENT_AMBULATORY_CARE_PROVIDER_SITE_OTHER): Payer: 59

## 2019-02-27 ENCOUNTER — Ambulatory Visit (INDEPENDENT_AMBULATORY_CARE_PROVIDER_SITE_OTHER): Payer: 59 | Admitting: Family

## 2019-02-27 VITALS — BP 158/88 | HR 62 | Temp 98.4°F | Ht 61.0 in | Wt 281.0 lb

## 2019-02-27 DIAGNOSIS — I10 Essential (primary) hypertension: Secondary | ICD-10-CM

## 2019-02-27 DIAGNOSIS — IMO0002 Reserved for concepts with insufficient information to code with codable children: Secondary | ICD-10-CM

## 2019-02-27 DIAGNOSIS — M109 Gout, unspecified: Secondary | ICD-10-CM

## 2019-02-27 DIAGNOSIS — E1121 Type 2 diabetes mellitus with diabetic nephropathy: Secondary | ICD-10-CM | POA: Diagnosis not present

## 2019-02-27 DIAGNOSIS — Z794 Long term (current) use of insulin: Secondary | ICD-10-CM

## 2019-02-27 DIAGNOSIS — E1165 Type 2 diabetes mellitus with hyperglycemia: Secondary | ICD-10-CM | POA: Diagnosis not present

## 2019-02-27 LAB — CBC WITH DIFFERENTIAL/PLATELET
Basophils Absolute: 0.1 10*3/uL (ref 0.0–0.1)
Basophils Relative: 0.9 % (ref 0.0–3.0)
Eosinophils Absolute: 0.4 10*3/uL (ref 0.0–0.7)
Eosinophils Relative: 4.2 % (ref 0.0–5.0)
HCT: 36.8 % (ref 36.0–46.0)
Hemoglobin: 11.7 g/dL — ABNORMAL LOW (ref 12.0–15.0)
Lymphocytes Relative: 20.7 % (ref 12.0–46.0)
Lymphs Abs: 1.8 10*3/uL (ref 0.7–4.0)
MCHC: 31.9 g/dL (ref 30.0–36.0)
MCV: 77.8 fl — ABNORMAL LOW (ref 78.0–100.0)
Monocytes Absolute: 0.7 10*3/uL (ref 0.1–1.0)
Monocytes Relative: 8.3 % (ref 3.0–12.0)
Neutro Abs: 5.8 10*3/uL (ref 1.4–7.7)
Neutrophils Relative %: 65.9 % (ref 43.0–77.0)
Platelets: 238 10*3/uL (ref 150.0–400.0)
RBC: 4.73 Mil/uL (ref 3.87–5.11)
RDW: 16.2 % — ABNORMAL HIGH (ref 11.5–15.5)
WBC: 8.8 10*3/uL (ref 4.0–10.5)

## 2019-02-27 LAB — COMPREHENSIVE METABOLIC PANEL
ALT: 12 U/L (ref 0–35)
AST: 13 U/L (ref 0–37)
Albumin: 3.6 g/dL (ref 3.5–5.2)
Alkaline Phosphatase: 105 U/L (ref 39–117)
BUN: 18 mg/dL (ref 6–23)
CO2: 26 mEq/L (ref 19–32)
Calcium: 9.3 mg/dL (ref 8.4–10.5)
Chloride: 101 mEq/L (ref 96–112)
Creatinine, Ser: 1.49 mg/dL — ABNORMAL HIGH (ref 0.40–1.20)
GFR: 43.21 mL/min — ABNORMAL LOW (ref >60.00)
Glucose, Bld: 257 mg/dL — ABNORMAL HIGH (ref 70–99)
Potassium: 4.1 mEq/L (ref 3.5–5.1)
Sodium: 137 mEq/L (ref 135–145)
Total Bilirubin: 0.3 mg/dL (ref 0.2–1.2)
Total Protein: 7.9 g/dL (ref 6.0–8.3)

## 2019-02-27 LAB — HEMOGLOBIN A1C: Hgb A1c MFr Bld: 13 % — ABNORMAL HIGH (ref 4.6–6.5)

## 2019-02-27 LAB — URIC ACID: Uric Acid, Serum: 6.6 mg/dL (ref 2.4–7.0)

## 2019-02-27 MED ORDER — ALLOPURINOL 300 MG PO TABS: 300.0000 mg | ORAL_TABLET | Freq: Every day | ORAL | 1 refills | Status: DC

## 2019-02-27 MED ORDER — NEBIVOLOL HCL 20 MG PO TABS: 20.0000 mg | ORAL_TABLET | Freq: Every day | ORAL | 1 refills | Status: DC

## 2019-02-27 MED ORDER — INDOMETHACIN 50 MG PO CAPS: 50.0000 mg | ORAL_CAPSULE | Freq: Three times a day (TID) | ORAL | 0 refills | Status: DC | PRN

## 2019-02-27 MED ORDER — AMLODIPINE BESYLATE 10 MG PO TABS: 10.0000 mg | ORAL_TABLET | Freq: Every day | ORAL | 1 refills | Status: DC

## 2019-02-27 MED ORDER — TELMISARTAN 80 MG PO TABS: 80.0000 mg | ORAL_TABLET | Freq: Every day | ORAL | 1 refills | Status: DC

## 2019-02-27 MED ORDER — HYDRALAZINE HCL 10 MG PO TABS: 10.0000 mg | ORAL_TABLET | Freq: Three times a day (TID) | ORAL | 1 refills | Status: DC

## 2019-02-27 MED ORDER — CHLORTHALIDONE 25 MG PO TABS: 25.0000 mg | ORAL_TABLET | Freq: Every day | ORAL | 1 refills | Status: DC

## 2019-02-27 NOTE — Progress Notes (Signed)
Lori Mclaughlin is a 58 y.o. female with the following history as recorded in EpicCare:  Patient Active Problem List   Diagnosis Date Noted  . 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 Myalgia 03/10/2017  . Screen for colon cancer 06/12/2015  . Visit for screening mammogram 06/12/2015  . Hypersomnia 04/11/2014  . Morbid obesity ([place]) 04/11/2014  . Bradycardia, drug induced 04/11/2014  . Chronic diastolic CHF (congestive heart failure), NYHA class 2 ([place]) 03/30/2014  . Low back pain radiating to left leg 01/26/2012  . Sleep apnea 11/13/2011  . Hyperlipidemia with target LDL less than 70 01/14/2011  . Type II diabetes mellitus with renal manifestations, uncontrolled ([place]) 08/05/2009  . Deficiency anemia 08/05/2009  . Essential hypertension, malignant 08/05/2009  . ALLERGIC RHINITIS 08/05/2009  . ASTHMA 08/05/2009  . GERD 08/05/2009  . OSTEOARTHRITIS 08/05/2009    Current Outpatient Medications  Medication Sig Dispense Refill  . allopurinol (ZYLOPRIM) 300 MG tablet Take 1 tablet (300 mg total) by mouth daily. 90 tablet 1  . amLODipine (NORVASC) 10 MG tablet Take 1 tablet (10 mg total) by mouth daily. 90 tablet 1  . aspirin EC 81 MG EC tablet Take 1 tablet (81 mg total) by mouth daily.    . Blood Glucose Monitoring Suppl (ACCU-CHEK GUIDE) w/Device KIT 1 each by Does not apply route 2 (two) times daily. Use to monitor blood glucose level twice per day 1 kit 0  . chlorthalidone (HYGROTON) 25 MG tablet Take 1 tablet (25 mg total) by mouth daily. 90 tablet 1  . escitalopram (LEXAPRO) 10 MG tablet Take 1 tablet (10 mg total) by mouth daily. 90 tablet 1  . fluticasone (FLONASE) 50 MCG/ACT nasal spray Place 2 sprays into both nostrils daily. (Patient taking differently: Place 2 sprays into both nostrils daily as needed for allergies. ) 16 g 6  . glucose blood test strip Use to monitor blood glucose level twice per day 100 each 12  . hydrALAZINE (APRESOLINE) 10 MG tablet Take 1 tablet (10 mg  total) by mouth 3 (three) times daily. 270 tablet 1  . HYDROcodone-acetaminophen (NORCO) 5-325 MG tablet Take 1 tablet by mouth every 6 (six) hours as needed for moderate pain. 20 tablet 0  . indomethacin (INDOCIN) 50 MG capsule Take 1 capsule (50 mg total) by mouth 3 (three) times daily as needed for moderate pain. 60 capsule 0  . Insulin Detemir (LEVEMIR FLEXTOUCH) 100 UNIT/ML Pen Inject 80 Units into the skin every morning. And pen needles 1/day 45 mL 3  . Lancets (ACCU-CHEK SOFT TOUCH) lancets Use to monitor blood glucose level twice per day 100 each 12  . Nebivolol HCl 20 MG TABS Take 1 tablet (20 mg total) by mouth daily. 90 tablet 1  . Saxagliptin-Metformin (KOMBIGLYZE XR) 2.11-998 MG TB24 Take 2 tablets by mouth every morning. 180 tablet 3  . telmisartan (MICARDIS) 80 MG tablet Take 1 tablet (80 mg total) by mouth daily. 90 tablet 1  . thiamine (VITAMIN B-1) 100 MG tablet Take 1 tablet (100 mg total) by mouth daily. 90 tablet 1   No current facility-administered medications for this visit.     Allergies: Latex and Lisinopril  Past Medical History:  Diagnosis Date  . Allergic rhinitis   . Asthma   . Atypical chest pain    a. 03/2014: normal nuclear stress test.  . Chronic diastolic CHF (congestive heart failure) ([place])    a. Dx 03/2014 with echo - moderate LVH, EF 63-14%, grade 1 diastolic dysfunction, mildly  dilated LA.  . CKD (chronic kidney disease), stage II   . Diabetes mellitus type II   . GERD (gastroesophageal reflux disease)   . Headache(784.0)    when my bp is up  . Hypertension   . Hypertensive heart disease   . Microcytic anemia   . Osteoarthritis     Past Surgical History:  Procedure Laterality Date  . SHOULDER ARTHROSCOPY Bilateral   . TUBAL LIGATION      Family History  Problem Relation Age of Onset  . Stroke Mother   . Cancer Father   . Hypertension Brother   . Diabetes Other   . Hypertension Other   . Stroke Other   . Cancer Other        colon;  prostate  . Kidney disease Neg Hx   . Alcohol abuse Neg Hx   . Asthma Neg Hx   . COPD Neg Hx   . Depression Neg Hx   . Drug abuse Neg Hx   . Early death Neg Hx   . Hearing loss Neg Hx   . Heart disease Neg Hx   . Hyperlipidemia Neg Hx     Social History   Tobacco Use  . Smoking status: Former Smoker    Quit date: 08/29/2010    Years since quitting: 8.5  . Smokeless tobacco: Never Used  Substance Use Topics  . Alcohol use: No    Alcohol/week: 5.0 standard drinks    Types: 5 Glasses of wine per week    Comment: quit 4 years    Subjective:  Follow-up on hypertension/ anxiety/ gout; last seen in office in October 2019; was referred to hypertension clinic- did not follow-up;  Currently prescribed: 1) Amlodipine 10 mg daily; 2) Bystolic 20 mg; 3) Chlorthalidone 25 mg daily; 4) Hydralazine 10 mg 2 x daily; 5) Micardis 80 mg daily; Denies any chest pain, shortness of breath, blurred vision or headache Needs refill on Allopurinol for gout- has not been taking regularly;  Overdue to see her endocrinologist for diabetes follow-up - appointment is scheduled for later this month; Feels like she is in a better place mentally- now working from home which has been beneficial; not taking Lexapro daily;      Objective:  Vitals:   02/27/19 1110  BP: (!) 158/88  Pulse: 62  Temp: 98.4 F (36.9 C)  TempSrc: Oral  SpO2: 98%  Weight: 281 lb (127.5 kg)  Height: _0  (1.549 m)    General: Well developed, well nourished, in no acute distress  Skin : Warm and dry.  Head: Normocephalic and atraumatic  Lungs: Respirations unlabored; clear to auscultation bilaterally without wheeze, rales, rhonchi  CVS exam: normal rate and regular rhythm.  Neurologic: Alert and oriented; speech intact; face symmetrical; moves all extremities well; CNII-XII intact without focal deficit   Assessment:  1. Gout, unspecified cause, unspecified chronicity, unspecified site   2. Essential hypertension, malignant    3. Uncontrolled type 2 diabetes mellitus with diabetic nephropathy, with long-term current use of insulin ([place])     Plan:  1. Check uric acid level; refill on Allopurinol 300 mg daily; 2. Uncontrolled; try increasing Hydralazine to 10 mg tid; continue other medications as prescribed; check CBC, CMP; follow-up in 2 months; 3. Check Hgba1c today; keep planned follow up with endocrinology.   No follow-ups on file.  Orders Placed This Encounter  Procedures  . CBC w/Diff    Standing Status:   Future    Number of Occurrences:  1    Standing Expiration Date:   02/27/2020  . Comp Met (CMET)    Standing Status:   Future    Number of Occurrences:   1    Standing Expiration Date:   02/27/2020  . Uric acid    Standing Status:   Future    Number of Occurrences:   1    Standing Expiration Date:   02/27/2020  . HgB A1c    Standing Status:   Future    Number of Occurrences:   1    Standing Expiration Date:   02/27/2020    Requested Prescriptions   Signed Prescriptions Disp Refills  . hydrALAZINE (APRESOLINE) 10 MG tablet 270 tablet 1    Sig: Take 1 tablet (10 mg total) by mouth 3 (three) times daily.  [name] amLODipine (NORVASC) 10 MG tablet 90 tablet 1    Sig: Take 1 tablet (10 mg total) by mouth daily.  . Nebivolol HCl 20 MG TABS 90 tablet 1    Sig: Take 1 tablet (20 mg total) by mouth daily.  . chlorthalidone (HYGROTON) 25 MG tablet 90 tablet 1    Sig: Take 1 tablet (25 mg total) by mouth daily.  [name] allopurinol (ZYLOPRIM) 300 MG tablet 90 tablet 1    Sig: Take 1 tablet (300 mg total) by mouth daily.  [name] telmisartan (MICARDIS) 80 MG tablet 90 tablet 1    Sig: Take 1 tablet (80 mg total) by mouth daily.  . indomethacin (INDOCIN) 50 MG capsule 60 capsule 0    Sig: Take 1 capsule (50 mg total) by mouth 3 (three) times daily as needed for moderate pain.

## 2019-04-14 ENCOUNTER — Other Ambulatory Visit: Payer: Self-pay | Admitting: Family

## 2019-04-28 ENCOUNTER — Ambulatory Visit: Payer: 59 | Admitting: Family

## 2019-05-16 ENCOUNTER — Telehealth: Payer: Self-pay

## 2019-05-16 ENCOUNTER — Telehealth: Payer: Self-pay | Admitting: Endocrinology

## 2019-05-16 NOTE — Telephone Encounter (Signed)
Patient states that the Levemir is not being covered her insurance anymore- Lantus and Trujeo however is being covered by her insurance. Patient would like to know which medication she could be substituted by the doctor.   Patient requests that maintenance medications go to [facility]

## 2019-05-16 NOTE — Telephone Encounter (Signed)
Conversation: Medication Refill (Newest Message First) Me to [place]      05/16/19 12:57 PM Per Dr. Cordelia Pen request, please call pt to schedule an appt.  Me     05/16/19 12:57 PM Note   Received 2 page fax from [facility] requesting refills of multiple medications. Per Dr. Loanne Drilling, unable to refill multiple medication requests without an appt. Routing this message to the front desk for scheduling purposes.

## 2019-05-16 NOTE — Telephone Encounter (Signed)
Received 2 page fax from [facility] requesting refills of multiple medications. Per Dr. Loanne Drilling, unable to refill multiple medication requests without an appt. Routing this message to the front desk for scheduling purposes.

## 2019-05-17 NOTE — Telephone Encounter (Signed)
LMTCB to schedule appointment °

## 2019-05-18 NOTE — Telephone Encounter (Signed)
OK 

## 2019-05-18 NOTE — Telephone Encounter (Signed)
Given the circumstances, would you like for pt to be converted to VV?

## 2019-05-18 NOTE — Telephone Encounter (Signed)
Please call pt to convert appt to VV

## 2019-05-18 NOTE — Telephone Encounter (Signed)
Patient is scheduled for appointment on 06/08/19 at 10:00 a.m. Patient's family has Covid 19 and patient is being tested tomorrow 05/18/19. If patient is cleared from Covid 19 she will call back to schedule a sooner appointment. Patient will be out of her medications requested from [facility] by next week (has a 5 day supply).

## 2019-05-19 NOTE — Telephone Encounter (Signed)
FYI, Patient scheduled for [facility]oxy on 05/23/19 at 11:00 a.m.

## 2019-05-22 ENCOUNTER — Encounter: Payer: Self-pay | Admitting: Endocrinology

## 2019-05-23 ENCOUNTER — Ambulatory Visit (INDEPENDENT_AMBULATORY_CARE_PROVIDER_SITE_OTHER): Payer: 59 | Admitting: Endocrinology

## 2019-05-23 ENCOUNTER — Other Ambulatory Visit: Payer: Self-pay

## 2019-05-23 DIAGNOSIS — IMO0002 Reserved for concepts with insufficient information to code with codable children: Secondary | ICD-10-CM

## 2019-05-23 DIAGNOSIS — E1165 Type 2 diabetes mellitus with hyperglycemia: Secondary | ICD-10-CM | POA: Diagnosis not present

## 2019-05-23 DIAGNOSIS — E1129 Type 2 diabetes mellitus with other diabetic kidney complication: Secondary | ICD-10-CM | POA: Diagnosis not present

## 2019-05-23 MED ORDER — KOMBIGLYZE XR 2.5-1000 MG PO TB24: 2.0000 | ORAL_TABLET | ORAL | 3 refills | Status: DC

## 2019-05-23 MED ORDER — TOUJEO MAX SOLOSTAR 300 UNIT/ML ~~LOC~~ SOPN: 70.0000 [IU] | PEN_INJECTOR | SUBCUTANEOUS | 3 refills | Status: DC

## 2019-05-23 NOTE — Progress Notes (Signed)
Subjective:    Patient ID: Lori Mclaughlin, female    DOB: 01-Jul-1961, 58 y.o.   MRN: [id number]  HPI telehealth visit today via doxy video visit.  Alternatives to telehealth are presented to this patient, and the patient agrees to the telehealth visit. Pt is advised of the cost of the visit, and agrees to this, also.   Patient is at home, and I am at the office.   Persons attending the telehealth visit: the patient and I.   Pt returns for f/u of diabetes mellitus: DM type: Insulin-requiring type 2.   Dx'ed: 0277 Complications: DR and renal insuff  Therapy: insulin since 2018 GDM: 1984 and 1987 DKA: never Severe hypoglycemia: never Pancreatitis: never Pancreatic imaging: never Other: she takes qd insulin, after poor results with multiple daily injections.   Interval history:   She says cbg's are in the 100's.  It is in general higher as the day goes on.  She has been out of the insulin x 3 days.  pt states she feels well in general.  She is in quarantine, due to dtr's pos coronavirus test.  Past Medical History:  Diagnosis Date  . Allergic rhinitis   . Asthma   . Atypical chest pain    a. 03/2014: normal nuclear stress test.  . Chronic diastolic CHF (congestive heart failure) ([place])    a. Dx 03/2014 with echo - moderate LVH, EF 41-28%, grade 1 diastolic dysfunction, mildly dilated LA.  . CKD (chronic kidney disease), stage II   . Diabetes mellitus type II   . GERD (gastroesophageal reflux disease)   . Headache(784.0)    when my bp is up  . Hypertension   . Hypertensive heart disease   . Microcytic anemia   . Osteoarthritis     Past Surgical History:  Procedure Laterality Date  . SHOULDER ARTHROSCOPY Bilateral   . TUBAL LIGATION      Social History   Socioeconomic History  . Marital status: Divorced    Spouse name: Not on file  . Number of children: Not on file  . Years of education: Not on file  . Highest education level: Not on file  Occupational History   . Not on file  Social Needs  . Financial resource strain: Not on file  . Food insecurity    Worry: Not on file    Inability: Not on file  . Transportation needs    Medical: Not on file    Non-medical: Not on file  Tobacco Use  . Smoking status: Former Smoker    Quit date: 08/29/2010    Years since quitting: 8.7  . Smokeless tobacco: Never Used  Substance and Sexual Activity  . Alcohol use: No    Alcohol/week: 5.0 standard drinks    Types: 5 Glasses of wine per week    Comment: quit 4 years  . Drug use: No  . Sexual activity: Yes    Birth control/protection: Surgical  Lifestyle  . Physical activity    Days per week: Not on file    Minutes per session: Not on file  . Stress: Not on file  Relationships  . Social Herbalist on phone: Not on file    Gets together: Not on file    Attends religious service: Not on file    Active member of club or organization: Not on file    Attends meetings of clubs or organizations: Not on file    Relationship status: Not on  file  . Intimate partner violence    Fear of current or ex partner: Not on file    Emotionally abused: Not on file    Physically abused: Not on file    Forced sexual activity: Not on file  Other Topics Concern  . Not on file  Social History Narrative   Regular Exercise- yes    Current Outpatient Medications on File Prior to Visit  Medication Sig Dispense Refill  . allopurinol (ZYLOPRIM) 300 MG tablet Take 1 tablet (300 mg total) by mouth daily. 90 tablet 1  . amLODipine (NORVASC) 10 MG tablet Take 1 tablet (10 mg total) by mouth daily. 90 tablet 1  . aspirin EC 81 MG EC tablet Take 1 tablet (81 mg total) by mouth daily.    . Blood Glucose Monitoring Suppl (ACCU-CHEK GUIDE) w/Device KIT 1 each by Does not apply route 2 (two) times daily. Use to monitor blood glucose level twice per day 1 kit 0  . chlorthalidone (HYGROTON) 25 MG tablet Take 1 tablet (25 mg total) by mouth daily. 90 tablet 1  . escitalopram  (LEXAPRO) 10 MG tablet Take 1 tablet (10 mg total) by mouth daily. 90 tablet 1  . fluticasone (FLONASE) 50 MCG/ACT nasal spray Place 2 sprays into both nostrils daily. (Patient taking differently: Place 2 sprays into both nostrils daily as needed for allergies. ) 16 g 6  . glucose blood test strip Use to monitor blood glucose level twice per day 100 each 12  . hydrALAZINE (APRESOLINE) 10 MG tablet Take 1 tablet (10 mg total) by mouth 3 (three) times daily. 270 tablet 1  . HYDROcodone-acetaminophen (NORCO) 5-325 MG tablet Take 1 tablet by mouth every 6 (six) hours as needed for moderate pain. 20 tablet 0  . indomethacin (INDOCIN) 50 MG capsule TAKE 1 CAPSULE(50 MG) BY MOUTH THREE TIMES DAILY AS NEEDED FOR MODERATE PAIN 60 capsule 0  . Lancets (ACCU-CHEK SOFT TOUCH) lancets Use to monitor blood glucose level twice per day 100 each 12  . Nebivolol HCl 20 MG TABS Take 1 tablet (20 mg total) by mouth daily. 90 tablet 1  . telmisartan (MICARDIS) 80 MG tablet Take 1 tablet (80 mg total) by mouth daily. 90 tablet 1  . thiamine (VITAMIN B-1) 100 MG tablet Take 1 tablet (100 mg total) by mouth daily. 90 tablet 1   No current facility-administered medications on file prior to visit.     Allergies  Allergen Reactions  . Latex     REACTION: itching,rash  . Lisinopril     REACTION: headache    Family History  Problem Relation Age of Onset  . Stroke Mother   . Cancer Father   . Hypertension Brother   . Diabetes Other   . Hypertension Other   . Stroke Other   . Cancer Other        colon; prostate  . Kidney disease Neg Hx   . Alcohol abuse Neg Hx   . Asthma Neg Hx   . COPD Neg Hx   . Depression Neg Hx   . Drug abuse Neg Hx   . Early death Neg Hx   . Hearing loss Neg Hx   . Heart disease Neg Hx   . Hyperlipidemia Neg Hx     LMP 02/23/2012    Review of Systems She has lost a few lbs, due to her efforts.      Objective:   Physical Exam   Lab Results  Component Value Date  CREATININE 1.49 (H) 02/27/2019   BUN 18 02/27/2019   NA 137 02/27/2019   K 4.1 02/27/2019   CL 101 02/27/2019   CO2 26 02/27/2019      Assessment & Plan:  Insulin-requiring type 2 DM, with DR: apparently well-controlled Renal insuff: this places her at risk for nocturnal hypoglycemia Noncompliance with insulin: she is not a candidate for multiple daily injections.   Patient Instructions  I have sent a prescription to your pharmacy, to change levemir to toujeo. Please come back for a follow-up appointment in 4-6 weeks. check your blood sugar twice a day.  vary the time of day when you check, between before the 3 meals, and at bedtime.  also check if you have symptoms of your blood sugar being too high or too low.  please keep a record of the readings and bring it to your next appointment here (or you can bring the meter itself).  You can write it on any piece of paper.  please call us sooner if your blood sugar goes below 70, or if you have a lot of readings over 200.

## 2019-05-23 NOTE — Patient Instructions (Signed)
I have sent a prescription to your pharmacy, to change levemir to toujeo. Please come back for a follow-up appointment in 4-6 weeks. check your blood sugar twice a day.  vary the time of day when you check, between before the 3 meals, and at bedtime.  also check if you have symptoms of your blood sugar being too high or too low.  please keep a record of the readings and bring it to your next appointment here (or you can bring the meter itself).  You can write it on any piece of paper.  please call us sooner if your blood sugar goes below 70, or if you have a lot of readings over 200.

## 2019-06-08 ENCOUNTER — Ambulatory Visit: Payer: 59 | Admitting: Endocrinology

## 2019-06-30 ENCOUNTER — Ambulatory Visit: Payer: 59 | Admitting: Family

## 2019-09-17 ENCOUNTER — Other Ambulatory Visit: Payer: Self-pay | Admitting: Family

## 2019-09-18 MED ORDER — INDOMETHACIN 50 MG PO CAPS: 50.0000 mg | ORAL_CAPSULE | Freq: Three times a day (TID) | ORAL | 0 refills | Status: DC | PRN

## 2019-09-18 NOTE — Telephone Encounter (Signed)
Needs to be seen; last refill without OV;

## 2019-10-02 ENCOUNTER — Ambulatory Visit: Payer: 59 | Admitting: Family

## 2019-10-02 ENCOUNTER — Encounter: Payer: Self-pay | Admitting: Family

## 2019-10-02 ENCOUNTER — Telehealth: Payer: Self-pay | Admitting: Family

## 2019-10-02 ENCOUNTER — Other Ambulatory Visit: Payer: Self-pay

## 2019-10-02 VITALS — BP 160/98 | HR 68 | Temp 98.9°F | Ht 61.0 in | Wt 281.4 lb

## 2019-10-02 DIAGNOSIS — I10 Essential (primary) hypertension: Secondary | ICD-10-CM

## 2019-10-02 DIAGNOSIS — R4689 Other symptoms and signs involving appearance and behavior: Secondary | ICD-10-CM | POA: Diagnosis not present

## 2019-10-02 DIAGNOSIS — M1A9XX0 Chronic gout, unspecified, without tophus (tophi): Secondary | ICD-10-CM

## 2019-10-02 DIAGNOSIS — E1129 Type 2 diabetes mellitus with other diabetic kidney complication: Secondary | ICD-10-CM

## 2019-10-02 DIAGNOSIS — E1165 Type 2 diabetes mellitus with hyperglycemia: Secondary | ICD-10-CM

## 2019-10-02 DIAGNOSIS — IMO0002 Reserved for concepts with insufficient information to code with codable children: Secondary | ICD-10-CM

## 2019-10-02 LAB — CBC WITH DIFFERENTIAL/PLATELET
Basophils Absolute: 0.1 10*3/uL (ref 0.0–0.1)
Basophils Relative: 1 % (ref 0.0–3.0)
Eosinophils Absolute: 0.6 10*3/uL (ref 0.0–0.7)
Eosinophils Relative: 7.4 % — ABNORMAL HIGH (ref 0.0–5.0)
HCT: 34.6 % — ABNORMAL LOW (ref 36.0–46.0)
Hemoglobin: 10.9 g/dL — ABNORMAL LOW (ref 12.0–15.0)
Lymphocytes Relative: 26 % (ref 12.0–46.0)
Lymphs Abs: 2.1 10*3/uL (ref 0.7–4.0)
MCHC: 31.6 g/dL (ref 30.0–36.0)
MCV: 79.3 fl (ref 78.0–100.0)
Monocytes Absolute: 0.8 10*3/uL (ref 0.1–1.0)
Monocytes Relative: 9.5 % (ref 3.0–12.0)
Neutro Abs: 4.6 10*3/uL (ref 1.4–7.7)
Neutrophils Relative %: 56.1 % (ref 43.0–77.0)
Platelets: 238 10*3/uL (ref 150.0–400.0)
RBC: 4.36 Mil/uL (ref 3.87–5.11)
RDW: 16.7 % — ABNORMAL HIGH (ref 11.5–15.5)
WBC: 8.2 10*3/uL (ref 4.0–10.5)

## 2019-10-02 LAB — COMPREHENSIVE METABOLIC PANEL
ALT: 10 U/L (ref 0–35)
AST: 12 U/L (ref 0–37)
Albumin: 3.3 g/dL — ABNORMAL LOW (ref 3.5–5.2)
Alkaline Phosphatase: 103 U/L (ref 39–117)
BUN: 30 mg/dL — ABNORMAL HIGH (ref 6–23)
CO2: 29 mEq/L (ref 19–32)
Calcium: 9.3 mg/dL (ref 8.4–10.5)
Chloride: 103 mEq/L (ref 96–112)
Creatinine, Ser: 1.61 mg/dL — ABNORMAL HIGH (ref 0.40–1.20)
GFR: 39.44 mL/min — ABNORMAL LOW (ref >60.00)
Glucose, Bld: 179 mg/dL — ABNORMAL HIGH (ref 70–99)
Potassium: 4.2 mEq/L (ref 3.5–5.1)
Sodium: 138 mEq/L (ref 135–145)
Total Bilirubin: 0.2 mg/dL (ref 0.2–1.2)
Total Protein: 7.7 g/dL (ref 6.0–8.3)

## 2019-10-02 LAB — LIPID PANEL
Cholesterol: 176 mg/dL (ref 0–200)
HDL: 51 mg/dL (ref >39.00)
LDL Cholesterol: 106 mg/dL — ABNORMAL HIGH (ref 0–99)
NonHDL: 125.14
Total CHOL/HDL Ratio: 3
Triglycerides: 95 mg/dL (ref 0.0–149.0)
VLDL: 19 mg/dL (ref 0.0–40.0)

## 2019-10-02 LAB — URIC ACID: Uric Acid, Serum: 5.5 mg/dL (ref 2.4–7.0)

## 2019-10-02 LAB — HEMOGLOBIN A1C: Hgb A1c MFr Bld: 12.8 % — ABNORMAL HIGH (ref 4.6–6.5)

## 2019-10-02 MED ORDER — ALLOPURINOL 300 MG PO TABS: 300.0000 mg | ORAL_TABLET | Freq: Every day | ORAL | 3 refills | Status: DC

## 2019-10-02 NOTE — Progress Notes (Signed)
Lori Mclaughlin is a 59 y.o. female with the following history as recorded in Epic[facility]:  Patient Active Problem List   Diagnosis Date Noted  . 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 Myalgia 03/10/2017  . Screen for colon cancer 06/12/2015  . Visit for screening mammogram 06/12/2015  . Hypersomnia 04/11/2014  . Morbid obesity (HCC) 04/11/2014  . Bradycardia, drug induced 04/11/2014  . Chronic diastolic CHF (congestive heart failure), NYHA class 2 (HCC) 03/30/2014  . Low back pain radiating to left leg 01/26/2012  . Sleep apnea 11/13/2011  . Hyperlipidemia with target LDL less than 70 01/14/2011  . Type II diabetes mellitus with renal manifestations, uncontrolled (HCC) 08/05/2009  . Deficiency anemia 08/05/2009  . Essential hypertension, malignant 08/05/2009  . ALLERGIC RHINITIS 08/05/2009  . ASTHMA 08/05/2009  . GERD 08/05/2009  . OSTEOARTHRITIS 08/05/2009    Current Outpatient Medications  Medication Sig Dispense Refill  . allopurinol (ZYLOPRIM) 300 MG tablet Take 1 tablet (300 mg total) by mouth daily. 90 tablet 3  . amLODipine (NORVASC) 10 MG tablet Take 1 tablet (10 mg total) by mouth daily. 90 tablet 1  . aspirin EC 81 MG EC tablet Take 1 tablet (81 mg total) by mouth daily.    . Blood Glucose Monitoring Suppl (ACCU-CHEK GUIDE) w/Device KIT 1 each by Does not apply route 2 (two) times daily. Use to monitor blood glucose level twice per day 1 kit 0  . chlorthalidone (HYGROTON) 25 MG tablet Take 1 tablet (25 mg total) by mouth daily. 90 tablet 1  . escitalopram (LEXAPRO) 10 MG tablet Take 1 tablet (10 mg total) by mouth daily. 90 tablet 1  . fluticasone (FLONASE) 50 MCG/ACT nasal spray Place 2 sprays into both nostrils daily. (Patient taking differently: Place 2 sprays into both nostrils daily as needed for allergies. ) 16 g 6  . glucose blood test strip Use to monitor blood glucose level twice per day 100 each 12  . hydrALAZINE (APRESOLINE) 10 MG tablet Take 1 tablet (10 mg  total) by mouth 3 (three) times daily. 270 tablet 1  . HYDROcodone-acetaminophen (NORCO) 5-325 MG tablet Take 1 tablet by mouth every 6 (six) hours as needed for moderate pain. 20 tablet 0  . indomethacin (INDOCIN) 50 MG capsule Take 1 capsule (50 mg total) by mouth 3 (three) times daily as needed. 60 capsule 0  . Insulin Glargine, 2 Unit Dial, (TOUJEO MAX SOLOSTAR) 300 UNIT/ML SOPN Inject 70 Units into the skin every morning. And pen needles 1/day 8 pen 3  . Lancets (ACCU-CHEK SOFT TOUCH) lancets Use to monitor blood glucose level twice per day 100 each 12  . Nebivolol HCl 20 MG TABS Take 1 tablet (20 mg total) by mouth daily. 90 tablet 1  . Saxagliptin-Metformin (KOMBIGLYZE XR) 2.11-998 MG TB24 Take 2 tablets by mouth every morning. 180 tablet 3  . telmisartan (MICARDIS) 80 MG tablet Take 1 tablet (80 mg total) by mouth daily. 90 tablet 1  . thiamine (VITAMIN B-1) 100 MG tablet Take 1 tablet (100 mg total) by mouth daily. 90 tablet 1   No current facility-administered medications for this visit.    Allergies: Latex and Lisinopril  Past Medical History:  Diagnosis Date  . Allergic rhinitis   . Asthma   . Atypical chest pain    a. 03/2014: normal nuclear stress test.  . Chronic diastolic CHF (congestive heart failure) (HCC)    a. Dx 03/2014 with echo - moderate LVH, EF 55-60%, grade 1 diastolic dysfunction, mildly   dilated LA.  . CKD (chronic kidney disease), stage II   . Diabetes mellitus type II   . GERD (gastroesophageal reflux disease)   . Headache(784.0)    when my bp is up  . Hypertension   . Hypertensive heart disease   . Microcytic anemia   . Osteoarthritis     Past Surgical History:  Procedure Laterality Date  . SHOULDER ARTHROSCOPY Bilateral   . TUBAL LIGATION      Family History  Problem Relation Age of Onset  . Stroke Mother   . Cancer Father   . Hypertension Brother   . Diabetes Other   . Hypertension Other   . Stroke Other   . Cancer Other        colon;  prostate  . Kidney disease Neg Hx   . Alcohol abuse Neg Hx   . Asthma Neg Hx   . COPD Neg Hx   . Depression Neg Hx   . Drug abuse Neg Hx   . Early death Neg Hx   . Hearing loss Neg Hx   . Heart disease Neg Hx   . Hyperlipidemia Neg Hx     Social History   Tobacco Use  . Smoking status: Former Smoker    Quit date: 08/29/2010    Years since quitting: 9.0  . Smokeless tobacco: Never Used  Substance Use Topics  . Alcohol use: No    Alcohol/week: 5.0 standard drinks    Types: 5 Glasses of wine per week    Comment: quit 4 years    Subjective:  Follow-up on hypertension; last seen in office in August 2020- was due for OV in October 2020; history of missed appointments; not taking medications as prescribed;  Overdue to see her endocrinologist as well; would like to get labs updated here so she can do virtual visit with Dr. Loanne Drilling.   Objective:  Vitals:   10/02/19 1059  BP: (!) 160/98  Pulse: 68  Temp: 98.9 F (37.2 C)  TempSrc: Oral  SpO2: 98%  Weight: 281 lb 6.4 oz (127.6 kg)  Height: 5' 1" (1.549 m)    General: Well developed, well nourished, in no acute distress  Skin : Warm and dry.  Head: Normocephalic and atraumatic  Lungs: Respirations unlabored; clear to auscultation bilaterally without wheeze, rales, rhonchi  CVS exam: normal rate and regular rhythm.  Vessels: Symmetric bilaterally  Neurologic: Alert and oriented; speech intact; face symmetrical; moves all extremities well; CNII-XII intact without focal deficit   Assessment:  1. Essential hypertension   2. Type II diabetes mellitus with renal manifestations, uncontrolled ([place])   3. Chronic gout without tophus, unspecified cause, unspecified site   4. Non-compliant behavior     Plan:  1. Uncontrolled; not taking medication as prescribed; has been referred to cardiology in the past for evaluation- did not keep that appointment; agrees to go; would like a consult in the hypertension clinic;  2. Update labs-  overdue to see her endocrinologist; agrees to call for appointment. 3. Check uric acid; refill updated on Allopurinol;  This visit occurred during the SARS-CoV-2 public health emergency.  Safety protocols were in place, including screening questions prior to the visit, additional usage of staff PPE, and extensive cleaning of exam room while observing appropriate contact time as indicated for disinfecting solutions.     No follow-ups on file.  Orders Placed This Encounter  Procedures  . CBC w/Diff  . Comp Met (CMET)  . Lipid panel  . HgB A1c  .  Uric acid  . Ambulatory referral to Cardiology    Referral Priority:   Routine    Referral Type:   Consultation    Referral Reason:   Specialty Services Required    Requested Specialty:   Cardiology    Number of Visits Requested:   1    Requested Prescriptions   Signed Prescriptions Disp Refills  . allopurinol (ZYLOPRIM) 300 MG tablet 90 tablet 3    Sig: Take 1 tablet (300 mg total) by mouth daily.

## 2019-10-02 NOTE — Telephone Encounter (Signed)
I received [employer] FMLA forms, daughter caring for mother.  Are you okay with these being completed again.  4 x 1 month lasting 10 hours.

## 2019-10-04 DIAGNOSIS — Z0279 Encounter for issue of other medical certificate: Secondary | ICD-10-CM

## 2019-10-04 NOTE — Telephone Encounter (Signed)
Forms have been completed, Copy sent to scan, &Charged for.   Patient &daughter have been informed, Daughter will pick up forms and take to employer.

## 2019-10-05 ENCOUNTER — Telehealth: Payer: Self-pay

## 2019-10-05 NOTE — Telephone Encounter (Signed)
New message    The patient calling upcoming surgery on 3.23.21 - cataract surgery.   Question regarding the COVID vaccine, patient advise can leave a message on voice mail.

## 2019-10-06 NOTE — Telephone Encounter (Signed)
Called and left message for patient to return call to office.  

## 2019-10-26 ENCOUNTER — Ambulatory Visit: Payer: 59 | Admitting: Cardiovascular Disease

## 2019-11-16 ENCOUNTER — Ambulatory Visit: Payer: 59 | Admitting: Cardiology

## 2019-11-16 NOTE — Progress Notes (Deleted)
Cardiology Consult Note    Date:  11/16/2019   ID:  Versie, Soave 02-Oct-1960, MRN [id number]  PCP:  Olive Bass, FNP  Cardiologist:  Armanda Magic, MD   No chief complaint on file.   History of Present Illness:  Lori Mclaughlin is a 59 y.o. female who is being seen today for the evaluation of HTN at the request of Olive Bass,*.  This is a 58yo AAF with a hx of chronic diastolic CHF, CKD stage 2, DM2, GERD, HTN.  She has been referred here today for evaluation of resistant HTN and to reestablish cardiac care. She is here today for followup and is doing well.  She denies any chest pain or pressure, SOB, DOE, PND, orthopnea, LE edema, dizziness, palpitations or syncope. She is compliant with her meds and is tolerating meds with no SE.    Past Medical History:  Diagnosis Date  . Allergic rhinitis   . Asthma   . Atypical chest pain    a. 03/2014: normal nuclear stress test.  . Chronic diastolic CHF (congestive heart failure) (HCC)    a. Dx 03/2014 with echo - moderate LVH, EF 55-60%, grade 1 diastolic dysfunction, mildly dilated LA.  . CKD (chronic kidney disease), stage II   . Diabetes mellitus type II   . GERD (gastroesophageal reflux disease)   . Headache(784.0)    when my bp is up  . Hypertension   . Hypertensive heart disease   . Microcytic anemia   . Osteoarthritis     Past Surgical History:  Procedure Laterality Date  . SHOULDER ARTHROSCOPY Bilateral   . TUBAL LIGATION      Current Medications: No outpatient medications have been marked as taking for the 11/16/19 encounter (Appointment) with Quintella Reichert, MD.    Allergies:   Latex and Lisinopril   Social History   Socioeconomic History  . Marital status: Divorced    Spouse name: Not on file  . Number of children: Not on file  . Years of education: Not on file  . Highest education level: Not on file  Occupational History  . Not on file  Tobacco Use  . Smoking  status: Former Smoker    Quit date: 08/29/2010    Years since quitting: 9.2  . Smokeless tobacco: Never Used  Substance and Sexual Activity  . Alcohol use: No    Alcohol/week: 5.0 standard drinks    Types: 5 Glasses of wine per week    Comment: quit 4 years  . Drug use: No  . Sexual activity: Yes    Birth control/protection: Surgical  Other Topics Concern  . Not on file  Social History Narrative   Regular Exercise- yes   Social Determinants of Health   Financial Resource Strain:   . Difficulty of Paying Living Expenses:   Food Insecurity:   . Worried About Programme researcher, broadcasting/film/video in the Last Year:   . Barista in the Last Year:   Transportation Needs:   . Freight forwarder (Medical):   [name] Lack of Transportation (Non-Medical):   Physical Activity:   . Days of Exercise per Week:   . Minutes of Exercise per Session:   Stress:   . Feeling of Stress :   Social Connections:   . Frequency of Communication with Friends and Family:   . Frequency of Social Gatherings with Friends and Family:   . Attends Religious Services:   . Active Member of  Clubs or Organizations:   . Attends Archivist Meetings:   [name] Marital Status:      Family History:  The patient's family history includes Cancer in her father and another family member; Diabetes in an other family member; Hypertension in her brother and another family member; Stroke in her mother and another family member.   ROS:   Please see the history of present illness.    ROS All other systems reviewed and are negative.  No flowsheet data found.     PHYSICAL EXAM:   VS:  LMP 02/23/2012    GEN: Well nourished, well developed, in no acute distress  HEENT: normal  Neck: no JVD, carotid bruits, or masses Cardiac: RRR; no murmurs, rubs, or gallops,no edema.  Intact distal pulses bilaterally.  Respiratory:  clear to auscultation bilaterally, normal work of breathing GI: soft, nontender, nondistended, + BS MS: no  deformity or atrophy  Skin: warm and dry, no rash Neuro:  Alert and Oriented x 3, Strength and sensation are intact Psych: euthymic mood, full affect  Wt Readings from Last 3 Encounters:  10/02/19 281 lb 6.4 oz (127.6 kg)  02/27/19 281 lb (127.5 kg)  05/25/18 282 lb 1.9 oz (128 kg)      Studies/Labs Reviewed:   EKG:  EKG is ordered today.  The ekg ordered today demonstrates .  DM  Recent Labs: 10/02/2019: ALT 10; BUN 30; Creatinine, Ser 1.61; Hemoglobin 10.9; Platelets 238.0; Potassium 4.2; Sodium 138   Lipid Panel    Component Value Date/Time   CHOL 176 10/02/2019 1126   TRIG 95.0 10/02/2019 1126   HDL 51.00 10/02/2019 1126   CHOLHDL 3 10/02/2019 1126   VLDL 19.0 10/02/2019 1126   LDLCALC 106 (H) 10/02/2019 1126    Additional studies/ records that were reviewed today include:  OV notes from PCP    ASSESSMENT:    1. Essential hypertension, malignant   2. Chronic diastolic CHF (congestive heart failure), NYHA class 2 ([place])   3. DM type 2, goal HbA1c < 7% (HCC)   4. Stage 3a chronic kidney disease      PLAN:  In order of problems listed above:  1. HTN -BP actually controlled today on exam -continue amlodipine 10mg  daily, Chlorthalidone 25mg  daily, Hydarlazine 10mg  TID, Bystolic 20mg  daily and Telmisartan 80mg  daily.  -Creatinine was 1.61 and K+ 4.2 in march 2021  2.  Chronic diastolic CHF -appears euvolemic on exam today -continue Chlorthalidone  3.  Type 2 DM -followed by PCP -continue Insulin and Saxagliptin-metformin  4.  CKD stage 3a -followed by PCP -likely related to underlying DM -continue ARB for renal protection      Medication Adjustments/Labs and Tests Ordered: Current medicines are reviewed at length with the patient today.  Concerns regarding medicines are outlined above.  Medication changes, Labs and Tests ordered today are listed in the Patient Instructions below.  There are no Patient Instructions on file for this  visit.   Signed, Fransico Him, MD  11/16/2019 8:00 AM    [address], [place], [place]  [address] Phone: [phone]; Fax: [phone]

## 2019-12-06 ENCOUNTER — Other Ambulatory Visit: Payer: Self-pay | Admitting: Family

## 2019-12-07 ENCOUNTER — Ambulatory Visit: Payer: 59 | Admitting: Cardiology

## 2019-12-11 ENCOUNTER — Encounter: Payer: Self-pay | Admitting: Cardiology

## 2019-12-11 ENCOUNTER — Ambulatory Visit: Payer: 59 | Admitting: Cardiology

## 2019-12-11 ENCOUNTER — Other Ambulatory Visit: Payer: Self-pay

## 2019-12-11 VITALS — BP 176/104 | HR 66 | Ht 61.0 in | Wt 283.4 lb

## 2019-12-11 DIAGNOSIS — G4710 Hypersomnia, unspecified: Secondary | ICD-10-CM

## 2019-12-11 DIAGNOSIS — I5032 Chronic diastolic (congestive) heart failure: Secondary | ICD-10-CM | POA: Diagnosis not present

## 2019-12-11 DIAGNOSIS — G4730 Sleep apnea, unspecified: Secondary | ICD-10-CM | POA: Diagnosis not present

## 2019-12-11 DIAGNOSIS — I10 Essential (primary) hypertension: Secondary | ICD-10-CM

## 2019-12-11 DIAGNOSIS — E785 Hyperlipidemia, unspecified: Secondary | ICD-10-CM | POA: Diagnosis not present

## 2019-12-11 MED ORDER — CARVEDILOL 25 MG PO TABS: 25.0000 mg | ORAL_TABLET | Freq: Two times a day (BID) | ORAL | 3 refills | Status: DC

## 2019-12-11 MED ORDER — HYDRALAZINE HCL 25 MG PO TABS: 25.0000 mg | ORAL_TABLET | Freq: Three times a day (TID) | ORAL | 3 refills | Status: DC

## 2019-12-11 MED ORDER — CHLORTHALIDONE 50 MG PO TABS: 50.0000 mg | ORAL_TABLET | Freq: Every day | ORAL | 3 refills | Status: DC

## 2019-12-11 NOTE — Patient Instructions (Signed)
Medication Instructions:  Your physician has recommended you make the following change in your medication:  1) STOP taking telmesartan   2) STOP taking Bystolic (nebivolol) 3) START taking carvedilol 25 mg twice daily  4) INCREASE chlorthalidone to 50 mg daily   *If you need a refill on your cardiac medications before your next appointment, please call your pharmacy*  Lab Work: TODAY: TSH, aldosterone, renin, PRA, 24 hour urine  In One Week: BMET  If you have labs (blood work) drawn today and your tests are completely normal, you will receive your results only by: [name] MyChart Message (if you have MyChart) OR . A paper copy in the mail If you have any lab test that is abnormal or we need to change your treatment, we will call you to review the results.   Testing/Procedures: Your physician has recommended that you have a sleep study. This test records several body functions during sleep, including: brain activity, eye movement, oxygen and carbon dioxide blood levels, heart rate and rhythm, breathing rate and rhythm, the flow of air through your mouth and nose, snoring, body muscle movements, and chest and belly movement.  Your physician has requested that you have a renal artery duplex. During this test, an ultrasound is used to evaluate blood flow to the kidneys. Allow one hour for this exam. Do not eat after midnight the day before and avoid carbonated beverages. Take your medications as you usually do.   Follow-Up: At [facility], you and your health needs are our priority.  As part of our continuing mission to provide you with exceptional heart care, we have created designated Provider Care Teams.  These Care Teams include your primary Cardiologist (physician) and Advanced Practice Providers (APPs -  Physician Assistants and Nurse Practitioners) who all work together to provide you with the care you need, when you need it.  Follow up with PharmD in the Hypertension Clinic in one week.     Your next appointment:   4 week(s)  The format for your next appointment:   In Person  Provider:   Armanda Magic, MD

## 2019-12-11 NOTE — Progress Notes (Signed)
Cardiology Consult Note    Date:  12/12/2019   ID:  Lori Mclaughlin, DOB 1960-12-25, MRN [id number]  PCP:  Marrian Salvage, FNP  Cardiologist:  Fransico Him, MD   Chief Complaint  Patient presents with  . Hypertension  . Congestive Heart Failure    History of Present Illness:  Lori Mclaughlin is a 59 y.o. female who is being seen today for the evaluation of HTN at the request of Marrian Salvage,*.  This is a 59yo AAF with a hx of asthma, atypical CP with normal nuclear stress test 8115, chronic diastolic CHF, CKD stage 2, DM2, GERD and HTN.  She recently has had problems with BP control and is now referred for further evaluation.   She denies any chest pain or pressure, SOB, DOE, PND, orthopnea, LE edema, dizziness, palpitations or syncope. She has chronic headaches which are worse with the Telmisartan and she has had problems with HAs on other ARBs as well as ACE I.  She tries to follow a low Na diet. She is compliant with her meds and is tolerating meds with no SE.    Past Medical History:  Diagnosis Date  . Allergic rhinitis   . Asthma   . Atypical chest pain    a. 03/2014: normal nuclear stress test.  . Chronic diastolic CHF (congestive heart failure) ([place])    a. Dx 03/2014 with echo - moderate LVH, EF 72-62%, grade 1 diastolic dysfunction, mildly dilated LA.  . CKD (chronic kidney disease), stage II   . Diabetes mellitus type II   . GERD (gastroesophageal reflux disease)   . Headache(784.0)    when my bp is up  . Hypertension   . Hypertensive heart disease   . Microcytic anemia   . Osteoarthritis     Past Surgical History:  Procedure Laterality Date  . SHOULDER ARTHROSCOPY Bilateral   . TUBAL LIGATION      Current Medications: Current Meds  Medication Sig  . allopurinol (ZYLOPRIM) 300 MG tablet Take 1 tablet (300 mg total) by mouth daily.  [name] amLODipine (NORVASC) 10 MG tablet Take 1 tablet (10 mg total) by mouth daily.  [name] aspirin EC 81  MG EC tablet Take 1 tablet (81 mg total) by mouth daily.  . Blood Glucose Monitoring Suppl (ACCU-CHEK GUIDE) w/Device KIT 1 each by Does not apply route 2 (two) times daily. Use to monitor blood glucose level twice per day  . escitalopram (LEXAPRO) 10 MG tablet Take 1 tablet (10 mg total) by mouth daily.  [name] glucose blood test strip Use to monitor blood glucose level twice per day  . HYDROcodone-acetaminophen (NORCO) 5-325 MG tablet Take 1 tablet by mouth every 6 (six) hours as needed for moderate pain.  . indomethacin (INDOCIN) 50 MG capsule TAKE 1 CAPSULE(50 MG) BY MOUTH THREE TIMES DAILY AS NEEDED  . Insulin Glargine, 2 Unit Dial, (TOUJEO MAX SOLOSTAR) 300 UNIT/ML SOPN Inject 70 Units into the skin every morning. And pen needles 1/day  . Lancets (ACCU-CHEK SOFT TOUCH) lancets Use to monitor blood glucose level twice per day  . moxifloxacin (VIGAMOX) 0.5 % ophthalmic solution As directed  . prednisoLONE acetate (PRED FORTE) 1 % ophthalmic suspension As directed  . Saxagliptin-Metformin (KOMBIGLYZE XR) 2.11-998 MG TB24 Take 2 tablets by mouth every morning.  . [DISCONTINUED] chlorthalidone (HYGROTON) 25 MG tablet Take 1 tablet (25 mg total) by mouth daily.  . [DISCONTINUED] hydrALAZINE (APRESOLINE) 10 MG tablet Take 1 tablet (10 mg total) by  mouth 3 (three) times daily.  . [DISCONTINUED] Nebivolol HCl 20 MG TABS Take 1 tablet (20 mg total) by mouth daily.  . [DISCONTINUED] telmisartan (MICARDIS) 80 MG tablet Take 1 tablet (80 mg total) by mouth daily.    Allergies:   Latex and Lisinopril   Social History   Socioeconomic History  . Marital status: Divorced    Spouse name: Not on file  . Number of children: Not on file  . Years of education: Not on file  . Highest education level: Not on file  Occupational History  . Not on file  Tobacco Use  . Smoking status: Former Smoker    Quit date: 08/29/2010    Years since quitting: 9.2  . Smokeless tobacco: Never Used  Substance and Sexual  Activity  . Alcohol use: No    Alcohol/week: 5.0 standard drinks    Types: 5 Glasses of wine per week    Comment: quit 4 years  . Drug use: No  . Sexual activity: Yes    Birth control/protection: Surgical  Other Topics Concern  . Not on file  Social History Narrative   Regular Exercise- yes   Social Determinants of Health   Financial Resource Strain:   . Difficulty of Paying Living Expenses:   Food Insecurity:   . Worried About Charity fundraiser in the Last Year:   . Arboriculturist in the Last Year:   Transportation Needs:   . Film/video editor (Medical):   [name] Lack of Transportation (Non-Medical):   Physical Activity:   . Days of Exercise per Week:   . Minutes of Exercise per Session:   Stress:   . Feeling of Stress :   Social Connections:   . Frequency of Communication with Friends and Family:   . Frequency of Social Gatherings with Friends and Family:   . Attends Religious Services:   . Active Member of Clubs or Organizations:   . Attends Archivist Meetings:   [name] Marital Status:      Family History:  The patient's family history includes Cancer in her father and another family member; Diabetes in an other family member; Hypertension in her brother and another family member; Stroke in her mother and another family member.   ROS:   Please see the history of present illness.    ROS All other systems reviewed and are negative.  No flowsheet data found.     PHYSICAL EXAM:   VS:  BP (!) 176/104   Pulse 66   Ht '5\' 1"'  (1.549 m)   Wt 283 lb 6.4 oz (128.5 kg)   LMP 02/23/2012   SpO2 99%   BMI 53.55 kg/m    GEN: Well nourished, well developed, in no acute distress  HEENT: normal  Neck: no JVD, carotid bruits, or masses Cardiac: RRR; no murmurs, rubs, or gallops,no edema.  Intact distal pulses bilaterally.  Respiratory:  clear to auscultation bilaterally, normal work of breathing GI: soft, nontender, nondistended, + BS MS: no deformity or atrophy    Skin: warm and dry, no rash Neuro:  Alert and Oriented x 3, Strength and sensation are intact Psych: euthymic mood, full affect  Wt Readings from Last 3 Encounters:  12/11/19 283 lb 6.4 oz (128.5 kg)  10/02/19 281 lb 6.4 oz (127.6 kg)  02/27/19 281 lb (127.5 kg)      Studies/Labs Reviewed:   EKG:  EKG is ordered today.  The ekg ordered today demonstrates NSR with nonspecific  ST abnormality  Recent Labs: 10/02/2019: ALT 10; BUN 30; Creatinine, Ser 1.61; Hemoglobin 10.9; Platelets 238.0; Potassium 4.2; Sodium 138 12/11/2019: TSH 3.250   Lipid Panel    Component Value Date/Time   CHOL 176 10/02/2019 1126   TRIG 95.0 10/02/2019 1126   HDL 51.00 10/02/2019 1126   CHOLHDL 3 10/02/2019 1126   VLDL 19.0 10/02/2019 1126   LDLCALC 106 (H) 10/02/2019 1126    Additional studies/ records that were reviewed today include:  OV notes from PCP, EKG  ASSESSMENT:    1. Chronic diastolic CHF (congestive heart failure), NYHA class 2 ([place])   2. Essential hypertension, malignant   3. Hyperlipidemia with target LDL less than 70      PLAN:  In order of problems listed above:  1.  Chronic diastolic CHF -she does not appear volume overloaded on exam -continue diuretic  2.  HTN -BP poorly controlled on exam -continue  amlodipine 75m daily -increase Chlorthalidone to 568mdaily  -Stop telmisartan due to chronic HA on ARBs it the past (had to stop Losartan in the past) -increase Hydralazine to 25100mID -change Bystolic to Carvedilol 77m09XID -HTN clinic with PharmD in 1 week -I will get a home sleep study to rule out OSA -renal duplex to rule out RAS -check plasma, renin, aldo and PRA ratio -24 hour urine for catechols, VMA, metanephrines, dopamine, cortisol  Medication Adjustments/Labs and Tests Ordered: Current medicines are reviewed at length with the patient today.  Concerns regarding medicines are outlined above.  Medication changes, Labs and Tests ordered today are listed in the  Patient Instructions below.  Patient Instructions  Medication Instructions:  Your physician has recommended you make the following change in your medication:  1) STOP taking telmesartan   2) STOP taking Bystolic (nebivolol) 3) START taking carvedilol 25 mg twice daily  4) INCREASE chlorthalidone to 50 mg daily   *If you need a refill on your cardiac medications before your next appointment, please call your pharmacy*  Lab Work: TODAY: TSH, aldosterone, renin, PRA, 24 hour urine  In One Week: BMET  If you have labs (blood work) drawn today and your tests are completely normal, you will receive your results only by: . MMarland KitchenChart Message (if you have MyChart) OR . A paper copy in the mail If you have any lab test that is abnormal or we need to change your treatment, we will call you to review the results.   Testing/Procedures: Your physician has recommended that you have a sleep study. This test records several body functions during sleep, including: brain activity, eye movement, oxygen and carbon dioxide blood levels, heart rate and rhythm, breathing rate and rhythm, the flow of air through your mouth and nose, snoring, body muscle movements, and chest and belly movement.  Your physician has requested that you have a renal artery duplex. During this test, an ultrasound is used to evaluate blood flow to the kidneys. Allow one hour for this exam. Do not eat after midnight the day before and avoid carbonated beverages. Take your medications as you usually do.   Follow-Up: At [facility] and your health needs are our priority.  As part of our continuing mission to provide you with exceptional heart care, we have created designated Provider Care Teams.  These Care Teams include your primary Cardiologist (physician) and Advanced Practice Providers (APPs -  Physician Assistants and Nurse Practitioners) who all work together to provide you with the care you need, when you need  it.  Follow up  with PharmD in the Hypertension Clinic in one week.    Your next appointment:   4 week(s)  The format for your next appointment:   In Person  Provider:   Fransico Him, MD       Signed, Fransico Him, MD  12/12/2019 6:43 PM    [address], [place], [place]  [address] Phone: [phone]; Fax: [phone]

## 2019-12-12 ENCOUNTER — Telehealth: Payer: Self-pay | Admitting: *Deleted

## 2019-12-12 NOTE — Telephone Encounter (Signed)
-----   Message from Theresia Majors, RN sent at 12/11/2019  2:56 PM EDT ----- Regarding: Home sleep study Home sleep study has been ordered.  Thanks!

## 2019-12-15 LAB — TSH: TSH: 3.25 u[IU]/mL (ref 0.450–4.500)

## 2019-12-15 LAB — ALDOSTERONE + RENIN ACTIVITY W/ RATIO
ALDOS/RENIN RATIO: 12.9 (ref 0.0–30.0)
ALDOSTERONE: 5.5 ng/dL (ref 0.0–30.0)
Renin: 0.426 ng/mL/hr (ref 0.167–5.380)

## 2019-12-24 ENCOUNTER — Telehealth: Payer: Self-pay | Admitting: *Deleted

## 2019-12-24 NOTE — Telephone Encounter (Signed)
Staff message sent to Lori Mclaughlin to schedule HST. No PA is required.

## 2019-12-24 NOTE — Telephone Encounter (Signed)
-----   Message from Carlyle Overbey, RN sent at 12/11/2019  2:56 PM EDT ----- Regarding: Home sleep study Home sleep study has been ordered.  Thanks!  

## 2019-12-26 NOTE — Progress Notes (Unsigned)
Patient ID: Lori Mclaughlin                 DOB: 1960-12-12                      MRN: [id number]     HPI: Lori Mclaughlin is a 59 y.o. female referred by Dr. Fransico Him to HTN clinic. PMH is significant for asthma, atypical CP with normal nuclear stress test 0350, chronic diastolic CHF (EF 09-38%), CKD stage 2, DM2, GERD and HTN.  At last visit with MD on 12/11/19, BP was elevated at 176/104. Chlorthalidone was increased to 50 mg daily, hydralazine was increased to 25 mg TID, and nebivolol was changed to carvedilol 25 mg BID. Home sleep study ordered to rule out OSA. Renal duplex to rule out RAS ordered for 12/27/19. TSH, aldosterone and renin WNL Additional labs to be collected: 24 hour urine for catechols, VMA, metanephrines, dopamine, cortisol.   Current HTN meds: amlodipine 10 mg daily, carvedilol 25 mg BID, chlorthalidone 50 mg daily, hydralazine 25 mg TID Previously tried: Headaches with ACEi/ARBs; clonidine patch BP goal: <130/80  Family History: The patient's family history includes Cancer in her father and another family member; Diabetes in an other family member; Hypertension in her brother and another family member; Stroke in her mother and another family member.   Social History: former smoker (quit 2012), no alcohol  Diet: ***  Exercise: ***  Home BP readings: ***  Wt Readings from Last 3 Encounters:  12/11/19 283 lb 6.4 oz (128.5 kg)  10/02/19 281 lb 6.4 oz (127.6 kg)  02/27/19 281 lb (127.5 kg)   BP Readings from Last 3 Encounters:  12/11/19 (!) 176/104  10/02/19 (!) 160/98  02/27/19 (!) 158/88   Pulse Readings from Last 3 Encounters:  12/11/19 66  10/02/19 68  02/27/19 62    Renal function: CrCl cannot be calculated (Patient's most recent lab result is older than the maximum 21 days allowed.).  Past Medical History:  Diagnosis Date  . Allergic rhinitis   . Asthma   . Atypical chest pain    a. 03/2014: normal nuclear stress test.  . Chronic  diastolic CHF (congestive heart failure) ([place])    a. Dx 03/2014 with echo - moderate LVH, EF 18-29%, grade 1 diastolic dysfunction, mildly dilated LA.  . CKD (chronic kidney disease), stage II   . Diabetes mellitus type II   . GERD (gastroesophageal reflux disease)   . Headache(784.0)    when my bp is up  . Hypertension   . Hypertensive heart disease   . Microcytic anemia   . Osteoarthritis     Current Outpatient Medications on File Prior to Visit  Medication Sig Dispense Refill  . allopurinol (ZYLOPRIM) 300 MG tablet Take 1 tablet (300 mg total) by mouth daily. 90 tablet 3  . amLODipine (NORVASC) 10 MG tablet Take 1 tablet (10 mg total) by mouth daily. 90 tablet 1  . aspirin EC 81 MG EC tablet Take 1 tablet (81 mg total) by mouth daily.    . Blood Glucose Monitoring Suppl (ACCU-CHEK GUIDE) w/Device KIT 1 each by Does not apply route 2 (two) times daily. Use to monitor blood glucose level twice per day 1 kit 0  . carvedilol (COREG) 25 MG tablet Take 1 tablet (25 mg total) by mouth 2 (two) times daily. 180 tablet 3  . chlorthalidone (HYGROTON) 50 MG tablet Take 1 tablet (50 mg total) by mouth daily. [place]  tablet 3  . escitalopram (LEXAPRO) 10 MG tablet Take 1 tablet (10 mg total) by mouth daily. 90 tablet 1  . glucose blood test strip Use to monitor blood glucose level twice per day 100 each 12  . hydrALAZINE (APRESOLINE) 25 MG tablet Take 1 tablet (25 mg total) by mouth 3 (three) times daily. 270 tablet 3  . HYDROcodone-acetaminophen (NORCO) 5-325 MG tablet Take 1 tablet by mouth every 6 (six) hours as needed for moderate pain. 20 tablet 0  . indomethacin (INDOCIN) 50 MG capsule TAKE 1 CAPSULE(50 MG) BY MOUTH THREE TIMES DAILY AS NEEDED 60 capsule 0  . Insulin Glargine, 2 Unit Dial, (TOUJEO MAX SOLOSTAR) 300 UNIT/ML SOPN Inject 70 Units into the skin every morning. And pen needles 1/day 8 pen 3  . Lancets (ACCU-CHEK SOFT TOUCH) lancets Use to monitor blood glucose level twice per day 100 each  12  . moxifloxacin (VIGAMOX) 0.5 % ophthalmic solution As directed    . prednisoLONE acetate (PRED FORTE) 1 % ophthalmic suspension As directed    . Saxagliptin-Metformin (KOMBIGLYZE XR) 2.11-998 MG TB24 Take 2 tablets by mouth every morning. 180 tablet 3   No current facility-administered medications on file prior to visit.    Allergies  Allergen Reactions  . Latex     REACTION: itching,rash  . Lisinopril     REACTION: headache    Assessment/Plan:  1.  HTN (Goal <130/80) -BP poorly controlled on exam *** -Continue amlodipine 11m daily -Continue chlorthalidone 577mdaily  -Continue carvedilol 2539mID -*** hydralazine *** ***mg TID -Unable to tolerate ACEi/ARBs due to chronic HA in the past  ChrKennon HolterharmD PGY1 Ambulatory Care Pharmacy Resident

## 2019-12-27 ENCOUNTER — Inpatient Hospital Stay (HOSPITAL_COMMUNITY): Admission: RE | Admit: 2019-12-27 | Payer: 59 | Source: Ambulatory Visit

## 2019-12-27 ENCOUNTER — Ambulatory Visit: Payer: 59

## 2019-12-27 NOTE — Telephone Encounter (Signed)
RE: Home sleep study Waddell, Wanda M, CMA  Romonda Parker G, CMA Ok to schedule HST no PA is required.       

## 2019-12-27 NOTE — Telephone Encounter (Signed)
Pt is aware and agreeable to treatment. 

## 2020-01-11 ENCOUNTER — Ambulatory Visit: Payer: 59 | Admitting: Cardiology

## 2020-01-12 ENCOUNTER — Ambulatory Visit: Payer: 59

## 2020-01-12 ENCOUNTER — Ambulatory Visit (HOSPITAL_COMMUNITY): Admission: RE | Admit: 2020-01-12 | Payer: 59 | Source: Ambulatory Visit | Attending: Cardiology | Admitting: Cardiology

## 2020-01-15 ENCOUNTER — Encounter: Payer: Self-pay | Admitting: Cardiology

## 2020-01-29 ENCOUNTER — Other Ambulatory Visit: Payer: Self-pay | Admitting: Family

## 2020-02-07 ENCOUNTER — Other Ambulatory Visit: Payer: Self-pay | Admitting: Family

## 2020-02-07 DIAGNOSIS — I10 Essential (primary) hypertension: Secondary | ICD-10-CM

## 2020-02-07 DIAGNOSIS — IMO0002 Reserved for concepts with insufficient information to code with codable children: Secondary | ICD-10-CM

## 2020-02-07 DIAGNOSIS — E1165 Type 2 diabetes mellitus with hyperglycemia: Secondary | ICD-10-CM

## 2020-02-07 MED ORDER — AMLODIPINE BESYLATE 10 MG PO TABS: 10.0000 mg | ORAL_TABLET | Freq: Every day | ORAL | 1 refills | Status: DC

## 2020-02-29 ENCOUNTER — Ambulatory Visit (HOSPITAL_BASED_OUTPATIENT_CLINIC_OR_DEPARTMENT_OTHER): Payer: 59 | Admitting: Cardiology

## 2020-02-29 ENCOUNTER — Other Ambulatory Visit: Payer: Self-pay

## 2020-02-29 ENCOUNTER — Ambulatory Visit (HOSPITAL_COMMUNITY)
Admission: RE | Admit: 2020-02-29 | Discharge: 2020-02-29 | Disposition: A | Discharge status: Home / Self Care | Payer: 59 | Source: Ambulatory Visit | Attending: Cardiovascular Disease | Admitting: Cardiovascular Disease

## 2020-02-29 DIAGNOSIS — I10 Essential (primary) hypertension: Secondary | ICD-10-CM

## 2020-02-29 DIAGNOSIS — E785 Hyperlipidemia, unspecified: Secondary | ICD-10-CM | POA: Insufficient documentation

## 2020-02-29 DIAGNOSIS — G4733 Obstructive sleep apnea (adult) (pediatric): Secondary | ICD-10-CM

## 2020-02-29 DIAGNOSIS — G4730 Sleep apnea, unspecified: Secondary | ICD-10-CM | POA: Diagnosis present

## 2020-02-29 DIAGNOSIS — G4710 Hypersomnia, unspecified: Secondary | ICD-10-CM

## 2020-02-29 DIAGNOSIS — I5032 Chronic diastolic (congestive) heart failure: Secondary | ICD-10-CM | POA: Insufficient documentation

## 2020-02-29 DIAGNOSIS — I110 Hypertensive heart disease with heart failure: Secondary | ICD-10-CM | POA: Insufficient documentation

## 2020-02-29 DIAGNOSIS — R0902 Hypoxemia: Secondary | ICD-10-CM | POA: Insufficient documentation

## 2020-03-04 ENCOUNTER — Other Ambulatory Visit: Payer: Self-pay | Admitting: Family

## 2020-03-04 DIAGNOSIS — K551 Chronic vascular disorders of intestine: Secondary | ICD-10-CM

## 2020-03-04 NOTE — Procedures (Signed)
   Patient Name: Lori Mclaughlin, Lori Mclaughlin Study Date: 02/29/2020 Gender: Female D.O.B: Oct 18, 1960 Age (years): 33 Referring Provider: Armanda Magic MD, ABSM Height (inches): 61 Interpreting Physician: Armanda Magic MD, ABSM Weight (lbs): 283 RPSGT: [place] BMI: 53 MRN: [id number] Neck Size: 14.50  CLINICAL INFORMATION Sleep Study Type: HST  Indication for sleep study: N/A  Epworth Sleepiness Score: 14  SLEEP STUDY TECHNIQUE A multi-channel overnight portable sleep study was performed. The channels recorded were: nasal airflow, thoracic respiratory movement, and oxygen saturation with a pulse oximetry. Snoring was also monitored.  MEDICATIONS Patient self administered medications include: N/A.  SLEEP ARCHITECTURE Patient was studied for 541.5 minutes. The sleep efficiency was 100.0 % and the patient was supine for 6.1%. The arousal index was 0.0 per hour.  RESPIRATORY PARAMETERS The overall AHI was 77.2 per hour, with a central apnea index of 0.0 per hour.  The oxygen nadir was 80% during sleep.  CARDIAC DATA Mean heart rate during sleep was 58.7 bpm.  IMPRESSIONS - Severe obstructive sleep apnea occurred during this study (AHI = 77.2/h). - No significant central sleep apnea occurred during this study (CAI = 0.0/h). - Severe oxygen desaturation was noted during this study (Min O2 = 80%). - Patient snored 38.9% during the sleep.  DIAGNOSIS - Obstructive Sleep Apnea (G47.33) - Nocturnal Hypoxemia (G47.36)  RECOMMENDATIONS - Recommend in lab CPAP titration.  - Avoid alcohol, sedatives and other CNS depressants that may worsen sleep apnea and disrupt normal sleep architecture. - Sleep hygiene should be reviewed to assess factors that may improve sleep quality. - Weight management and regular exercise should be initiated or continued.  [Electronically signed] 03/04/2020 10:39 PM  Armanda Magic MD, ABSM Diplomate, American Board of Sleep Medicine

## 2020-03-08 ENCOUNTER — Telehealth: Payer: Self-pay | Admitting: *Deleted

## 2020-03-08 DIAGNOSIS — G4730 Sleep apnea, unspecified: Secondary | ICD-10-CM

## 2020-03-08 NOTE — Telephone Encounter (Signed)
Informed patient of sleep study results and patient understanding was verbalized. Patient understands her sleep study showed that they have sleep apnea and recommend CPAP titration. Please set up titration in the sleep lab.   Pt is aware and agreeable to her results. Titration sent to sleep pool. 

## 2020-03-08 NOTE — Telephone Encounter (Signed)
-----   Message from Quintella Reichert, MD sent at 03/04/2020 10:42 PM EDT ----- Please let patient know that they have sleep apnea and recommend CPAP titration. Please set up titration in the sleep lab.

## 2020-03-11 ENCOUNTER — Telehealth: Payer: Self-pay | Admitting: *Deleted

## 2020-03-11 NOTE — Telephone Encounter (Signed)
In lab sleep study PA submitted to [facility] via web portal.

## 2020-03-20 ENCOUNTER — Telehealth: Payer: Self-pay | Admitting: *Deleted

## 2020-03-20 NOTE — Telephone Encounter (Signed)
Staff message sent to [place] ok to schedule titration sleep study. [employer] auth received. Auth # [id number]. Valid dates 03/11/20 to 06/11/20.

## 2020-03-21 NOTE — Addendum Note (Signed)
Addended by: Reesa Chew on: 03/21/2020 03:14 PM   Modules accepted: Orders

## 2020-03-25 NOTE — Telephone Encounter (Signed)
Patient is scheduled for CPAP Titration on 04/19/20. Patient understands her titration study will be done at [facility] sleep lab. Patient understands she will receive a letter in a week or so detailing appointment, date, time, and location. Patient understands to call if she does not receive the letter  in a timely manner.  Left detailed message on voicemail with date and time of titration and informed patient to call back to confirm or reschedule.

## 2020-04-03 ENCOUNTER — Other Ambulatory Visit: Payer: Self-pay | Admitting: Family

## 2020-04-19 ENCOUNTER — Encounter (HOSPITAL_BASED_OUTPATIENT_CLINIC_OR_DEPARTMENT_OTHER): Payer: 59 | Admitting: Cardiology

## 2020-05-09 ENCOUNTER — Other Ambulatory Visit: Payer: Self-pay | Admitting: Endocrinology

## 2020-05-14 ENCOUNTER — Other Ambulatory Visit: Payer: Self-pay | Admitting: Endocrinology

## 2020-05-28 ENCOUNTER — Other Ambulatory Visit: Payer: Self-pay | Admitting: Family

## 2020-07-10 ENCOUNTER — Other Ambulatory Visit: Payer: Self-pay | Admitting: Endocrinology

## 2020-07-23 ENCOUNTER — Other Ambulatory Visit: Payer: Self-pay | Admitting: Family

## 2020-08-07 ENCOUNTER — Ambulatory Visit: Payer: 59 | Admitting: Family

## 2020-09-03 ENCOUNTER — Telehealth: Payer: Self-pay | Admitting: Family

## 2020-09-03 NOTE — Telephone Encounter (Signed)
   Patient requesting a letter to provide her employer [employer]. Patient is requesting an accomodation to temporarily work from home She states [employer] does not require mask be worn and several staff members have become covid positive  [employer]  Fax [phone] Attn: Accommodations

## 2020-09-09 ENCOUNTER — Other Ambulatory Visit: Payer: Self-pay | Admitting: Family

## 2020-09-09 NOTE — Telephone Encounter (Signed)
Letter on my desk

## 2020-09-10 NOTE — Telephone Encounter (Signed)
Pt notified that letter will be faxed today to the number she provided. Pt would also like a copy of the letter mailed to [address] [place], [place] [address].  Will also mail once email confirmation received.

## 2020-09-16 ENCOUNTER — Other Ambulatory Visit: Payer: Self-pay | Admitting: Family

## 2020-09-16 DIAGNOSIS — Z1231 Encounter for screening mammogram for malignant neoplasm of breast: Secondary | ICD-10-CM

## 2020-10-02 ENCOUNTER — Other Ambulatory Visit: Payer: Self-pay | Admitting: Endocrinology

## 2020-10-05 ENCOUNTER — Other Ambulatory Visit: Payer: Self-pay | Admitting: Cardiology

## 2020-10-05 ENCOUNTER — Other Ambulatory Visit: Payer: Self-pay | Admitting: Endocrinology

## 2020-10-19 ENCOUNTER — Other Ambulatory Visit: Payer: Self-pay | Admitting: Endocrinology

## 2020-10-22 ENCOUNTER — Other Ambulatory Visit: Payer: Self-pay

## 2020-10-22 MED ORDER — CARVEDILOL 25 MG PO TABS: 25.0000 mg | ORAL_TABLET | Freq: Two times a day (BID) | ORAL | 0 refills | Status: DC

## 2020-10-22 MED ORDER — CHLORTHALIDONE 50 MG PO TABS: 50.0000 mg | ORAL_TABLET | Freq: Every day | ORAL | 0 refills | Status: DC

## 2020-10-22 MED ORDER — HYDRALAZINE HCL 25 MG PO TABS: 25.0000 mg | ORAL_TABLET | Freq: Three times a day (TID) | ORAL | 0 refills | Status: DC

## 2020-11-05 ENCOUNTER — Inpatient Hospital Stay: Admission: RE | Admit: 2020-11-05 | Payer: 59 | Source: Ambulatory Visit

## 2020-11-29 ENCOUNTER — Ambulatory Visit: Payer: 59 | Admitting: Endocrinology

## 2020-12-03 ENCOUNTER — Other Ambulatory Visit: Payer: Self-pay | Admitting: Cardiology

## 2020-12-04 ENCOUNTER — Other Ambulatory Visit: Payer: Self-pay | Admitting: Cardiology

## 2020-12-05 MED ORDER — CARVEDILOL 25 MG PO TABS: 25.0000 mg | ORAL_TABLET | Freq: Two times a day (BID) | ORAL | 0 refills | Status: DC

## 2020-12-05 MED ORDER — HYDRALAZINE HCL 25 MG PO TABS: 25.0000 mg | ORAL_TABLET | Freq: Three times a day (TID) | ORAL | 0 refills | Status: DC

## 2020-12-06 ENCOUNTER — Other Ambulatory Visit: Payer: Self-pay

## 2020-12-06 ENCOUNTER — Telehealth: Payer: Self-pay | Admitting: Cardiology

## 2020-12-06 ENCOUNTER — Ambulatory Visit: Payer: 59 | Admitting: Family

## 2020-12-06 ENCOUNTER — Encounter: Payer: Self-pay | Admitting: Family

## 2020-12-06 VITALS — BP 160/108 | HR 63 | Temp 98.4°F | Ht 60.0 in | Wt 276.8 lb

## 2020-12-06 DIAGNOSIS — E1129 Type 2 diabetes mellitus with other diabetic kidney complication: Secondary | ICD-10-CM | POA: Diagnosis not present

## 2020-12-06 DIAGNOSIS — IMO0002 Reserved for concepts with insufficient information to code with codable children: Secondary | ICD-10-CM

## 2020-12-06 DIAGNOSIS — E1165 Type 2 diabetes mellitus with hyperglycemia: Secondary | ICD-10-CM | POA: Diagnosis not present

## 2020-12-06 DIAGNOSIS — F4321 Adjustment disorder with depressed mood: Secondary | ICD-10-CM | POA: Diagnosis not present

## 2020-12-06 DIAGNOSIS — I10 Essential (primary) hypertension: Secondary | ICD-10-CM

## 2020-12-06 LAB — LIPID PANEL
Cholesterol: 189 mg/dL (ref 0–200)
HDL: 47.9 mg/dL (ref >39.00)
LDL Cholesterol: 109 mg/dL — ABNORMAL HIGH (ref 0–99)
NonHDL: 140.8
Total CHOL/HDL Ratio: 4
Triglycerides: 158 mg/dL — ABNORMAL HIGH (ref 0.0–149.0)
VLDL: 31.6 mg/dL (ref 0.0–40.0)

## 2020-12-06 LAB — CBC WITH DIFFERENTIAL/PLATELET
Basophils Absolute: 0.1 10*3/uL (ref 0.0–0.1)
Basophils Relative: 1 % (ref 0.0–3.0)
Eosinophils Absolute: 0.4 10*3/uL (ref 0.0–0.7)
Eosinophils Relative: 5.2 % — ABNORMAL HIGH (ref 0.0–5.0)
HCT: 34.5 % — ABNORMAL LOW (ref 36.0–46.0)
Hemoglobin: 10.7 g/dL — ABNORMAL LOW (ref 12.0–15.0)
Lymphocytes Relative: 20.3 % (ref 12.0–46.0)
Lymphs Abs: 1.4 10*3/uL (ref 0.7–4.0)
MCHC: 31 g/dL (ref 30.0–36.0)
MCV: 79.2 fl (ref 78.0–100.0)
Monocytes Absolute: 0.5 10*3/uL (ref 0.1–1.0)
Monocytes Relative: 7.4 % (ref 3.0–12.0)
Neutro Abs: 4.6 10*3/uL (ref 1.4–7.7)
Neutrophils Relative %: 66.1 % (ref 43.0–77.0)
Platelets: 201 10*3/uL (ref 150.0–400.0)
RBC: 4.35 Mil/uL (ref 3.87–5.11)
RDW: 16.8 % — ABNORMAL HIGH (ref 11.5–15.5)
WBC: 7 10*3/uL (ref 4.0–10.5)

## 2020-12-06 LAB — COMPREHENSIVE METABOLIC PANEL
ALT: 10 U/L (ref 0–35)
AST: 10 U/L (ref 0–37)
Albumin: 3.4 g/dL — ABNORMAL LOW (ref 3.5–5.2)
Alkaline Phosphatase: 123 U/L — ABNORMAL HIGH (ref 39–117)
BUN: 36 mg/dL — ABNORMAL HIGH (ref 6–23)
CO2: 27 mEq/L (ref 19–32)
Calcium: 8.8 mg/dL (ref 8.4–10.5)
Chloride: 95 mEq/L — ABNORMAL LOW (ref 96–112)
Creatinine, Ser: 2.03 mg/dL — ABNORMAL HIGH (ref 0.40–1.20)
GFR: 25.97 mL/min — ABNORMAL LOW (ref >60.00)
Glucose, Bld: 496 mg/dL — ABNORMAL HIGH (ref 70–99)
Potassium: 4.7 mEq/L (ref 3.5–5.1)
Sodium: 132 mEq/L — ABNORMAL LOW (ref 135–145)
Total Bilirubin: 0.3 mg/dL (ref 0.2–1.2)
Total Protein: 7 g/dL (ref 6.0–8.3)

## 2020-12-06 LAB — HEMOGLOBIN A1C: Hgb A1c MFr Bld: 15.8 % — ABNORMAL HIGH (ref 4.6–6.5)

## 2020-12-06 MED ORDER — ESCITALOPRAM OXALATE 10 MG PO TABS: 10.0000 mg | ORAL_TABLET | Freq: Every day | ORAL | 0 refills | Status: DC

## 2020-12-06 MED ORDER — ESCITALOPRAM OXALATE 10 MG PO TABS: 10.0000 mg | ORAL_TABLET | Freq: Every day | ORAL | 1 refills | Status: DC

## 2020-12-06 NOTE — Telephone Encounter (Signed)
Left message for patient to call back  

## 2020-12-06 NOTE — Telephone Encounter (Signed)
Pt c/o BP issue: STAT if pt c/o blurred vision, one-sided weakness or slurred speech  1. What are your last 5 BP readings?  Today at PCP 160/108  A1c was 115.5   2. Are you having any other symptoms (ex. Dizziness, headache, blurred vision, passed out)? No   3. What is your BP issue? Hypertension was advised to schedule a f/u in regards to this. First available is 01/13/21 needing sooner. Please advise.

## 2020-12-06 NOTE — Patient Instructions (Signed)
Please call Dr. Everardo All and Dr. Mayford Knife for follow up; please contact your disability group to start the process to protect your job;  Re-start the Lexapro as discussed; referral to the counselor has been started;

## 2020-12-06 NOTE — Progress Notes (Signed)
Lori Mclaughlin is a 60 y.o. female with the following history as recorded in EpicCare:  Patient Active Problem List   Diagnosis Date Noted  . 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 Myalgia 03/10/2017  . Screen for colon cancer 06/12/2015  . Visit for screening mammogram 06/12/2015  . Hypersomnia 04/11/2014  . Morbid obesity ([place]) 04/11/2014  . Bradycardia, drug induced 04/11/2014  . Chronic diastolic CHF (congestive heart failure), NYHA class 2 ([place]) 03/30/2014  . Low back pain radiating to left leg 01/26/2012  . Sleep apnea 11/13/2011  . Hyperlipidemia with target LDL less than 70 01/14/2011  . Type II diabetes mellitus with renal manifestations, uncontrolled ([place]) 08/05/2009  . Deficiency anemia 08/05/2009  . Essential hypertension, malignant 08/05/2009  . ALLERGIC RHINITIS 08/05/2009  . ASTHMA 08/05/2009  . GERD 08/05/2009  . OSTEOARTHRITIS 08/05/2009    Current Outpatient Medications  Medication Sig Dispense Refill  . allopurinol (ZYLOPRIM) 300 MG tablet TAKE 1 TABLET BY MOUTH  DAILY 90 tablet 3  . amLODipine (NORVASC) 10 MG tablet Take 1 tablet (10 mg total) by mouth daily. 90 tablet 1  . aspirin EC 81 MG EC tablet Take 1 tablet (81 mg total) by mouth daily.    . Blood Glucose Monitoring Suppl (ACCU-CHEK GUIDE) w/Device KIT 1 each by Does not apply route 2 (two) times daily. Use to monitor blood glucose level twice per day 1 kit 0  . carvedilol (COREG) 25 MG tablet Take 1 tablet (25 mg total) by mouth 2 (two) times daily with a meal. Please make overdue appt with Dr. Radford Pax before anymore refills. Thank you 1st attempt 60 tablet 0  . chlorthalidone (HYGROTON) 50 MG tablet Take 1 tablet (50 mg total) by mouth daily. Please make overdue appt with Dr. Radford Pax before anymore refills. Thank you 1st attempt 30 tablet 0  . glucose blood test strip Use to monitor blood glucose level twice per day 100 each 12  . hydrALAZINE (APRESOLINE) 25 MG tablet Take 1 tablet (25 mg total) by mouth  3 (three) times daily. Please make overdue appt with Dr. Radford Pax before anymore refills. Thank you 1st attempt 90 tablet 0  . HYDROcodone-acetaminophen (NORCO) 5-325 MG tablet Take 1 tablet by mouth every 6 (six) hours as needed for moderate pain. 20 tablet 0  . indomethacin (INDOCIN) 50 MG capsule TAKE 1 CAPSULE(50 MG) BY MOUTH THREE TIMES DAILY AS NEEDED 60 capsule 0  . KOMBIGLYZE XR 2.11-998 MG TB24 TAKE 2 TABLETS BY MOUTH IN  THE MORNING 180 tablet 0  . Lancets (ACCU-CHEK SOFT TOUCH) lancets Use to monitor blood glucose level twice per day 100 each 12  . TOUJEO MAX SOLOSTAR 300 UNIT/ML Solostar Pen INJECT 70 UNITS INTO THE  SKIN IN THE MORNING 24 mL 3  . escitalopram (LEXAPRO) 10 MG tablet Take 1 tablet (10 mg total) by mouth daily. 90 tablet 0   No current facility-administered medications for this visit.    Allergies: Latex and Lisinopril  Past Medical History:  Diagnosis Date  . Allergic rhinitis   . Asthma   . Atypical chest pain    a. 03/2014: normal nuclear stress test.  . Chronic diastolic CHF (congestive heart failure) ([place])    a. Dx 03/2014 with echo - moderate LVH, EF 53-66%, grade 1 diastolic dysfunction, mildly dilated LA.  . CKD (chronic kidney disease), stage II   . Diabetes mellitus type II   . GERD (gastroesophageal reflux disease)   . Headache(784.0)    when my bp  is up  . Hypertension   . Hypertensive heart disease   . Microcytic anemia   . Osteoarthritis     Past Surgical History:  Procedure Laterality Date  . SHOULDER ARTHROSCOPY Bilateral   . TUBAL LIGATION      Family History  Problem Relation Age of Onset  . Stroke Mother   . Cancer Father   . Hypertension Brother   . Diabetes Other   . Hypertension Other   . Stroke Other   . Cancer Other        colon; prostate  . Kidney disease Neg Hx   . Alcohol abuse Neg Hx   . Asthma Neg Hx   . COPD Neg Hx   . Depression Neg Hx   . Drug abuse Neg Hx   . Early death Neg Hx   . Hearing loss Neg Hx   .  Heart disease Neg Hx   . Hyperlipidemia Neg Hx     Social History   Tobacco Use  . Smoking status: Former Smoker    Quit date: 08/29/2010    Years since quitting: 10.2  . Smokeless tobacco: Never Used  Substance Use Topics  . Alcohol use: No    Alcohol/week: 5.0 standard drinks    Types: 5 Glasses of wine per week    Comment: quit 4 years    Subjective:   Has not been seen since March 2021; has not been seeing her endocrinologist or cardiologist either; readily admits she is not taking her medications as prescribed; does not check her blood sugar at home; does check her blood pressure at home and notes it is never as high as is seen today;  Notes that 3 weeks ago she lost her aunt who raised her/ second mother; having a very difficult time adjusting/ focusing at work; would like to start FMLA/ short-term disability process; requesting re-start of Lexapro and referral to therapist as well;   Objective:  Vitals:   12/06/20 0957  BP: (!) 160/108  Pulse: 63  Temp: 98.4 F (36.9 C)  TempSrc: Oral  SpO2: 99%  Weight: 276 lb 12.8 oz (125.6 kg)  Height: 5' (1.524 m)    General: Well developed, well nourished, in no acute distress; tearful  Skin : Warm and dry.  Head: Normocephalic and atraumatic  Lungs: Respirations unlabored; clear to auscultation bilaterally without wheeze, rales, rhonchi  CVS exam: normal rate and regular rhythm.  Neurologic: Alert and oriented; speech intact; face symmetrical; moves all extremities well; CNII-XII intact without focal deficit   Assessment:  1. Grief reaction   2. Essential hypertension, malignant   3. Type II diabetes mellitus with renal manifestations, uncontrolled ([place])     Plan:  1. Rx for Lexapro 10 mg- she has done well on this in the past; refer to therapist; she will ask HR about process to get short term leave from her job and we will complete needed paperwork; 2. Not taking medications as prescribed- stressed need to see her  cardiologist; 3. Not taking medications as prescribed- check labs; stressed need to see her endocrinologist;  This visit occurred during the SARS-CoV-2 public health emergency.  Safety protocols were in place, including screening questions prior to the visit, additional usage of staff PPE, and extensive cleaning of exam room while observing appropriate contact time as indicated for disinfecting solutions.     No follow-ups on file.  Orders Placed This Encounter  Procedures  . CBC with Differential/Platelet  . Comp Met (CMET)  .  Lipid panel  . Hemoglobin A1c  . Ambulatory referral to Psychology    Referral Priority:   Routine    Referral Type:   Psychiatric    Referral Reason:   Specialty Services Required    Requested Specialty:   Psychology    Number of Visits Requested:   1    Requested Prescriptions   Signed Prescriptions Disp Refills  . escitalopram (LEXAPRO) 10 MG tablet 90 tablet 0    Sig: Take 1 tablet (10 mg total) by mouth daily.

## 2020-12-09 ENCOUNTER — Telehealth (INDEPENDENT_AMBULATORY_CARE_PROVIDER_SITE_OTHER): Payer: 59 | Admitting: Endocrinology

## 2020-12-09 ENCOUNTER — Other Ambulatory Visit: Payer: Self-pay

## 2020-12-09 ENCOUNTER — Telehealth: Payer: Self-pay | Admitting: Family

## 2020-12-09 VITALS — Ht 60.0 in | Wt 276.0 lb

## 2020-12-09 DIAGNOSIS — E1129 Type 2 diabetes mellitus with other diabetic kidney complication: Secondary | ICD-10-CM

## 2020-12-09 DIAGNOSIS — E1165 Type 2 diabetes mellitus with hyperglycemia: Secondary | ICD-10-CM | POA: Diagnosis not present

## 2020-12-09 DIAGNOSIS — IMO0002 Reserved for concepts with insufficient information to code with codable children: Secondary | ICD-10-CM

## 2020-12-09 MED ORDER — FREESTYLE LIBRE 2 READER DEVI: 1.0000 | Freq: Once | 1 refills | Status: AC

## 2020-12-09 MED ORDER — FREESTYLE LIBRE 2 SENSOR MISC
1.0000 | 3 refills | Status: DC
Start: 2020-12-09 — End: 2022-03-18

## 2020-12-09 NOTE — Telephone Encounter (Signed)
I have received the form and printed out her old FMLA forms which are not similar at all. She must have changed jobs. Forms are currently in my yellow TO-DO folder

## 2020-12-09 NOTE — Patient Instructions (Addendum)
Please continue the same medications.   I have sent a prescription to your pharmacy, for the continuous glucose monitor.  Please come back for a follow-up appointment in 2 months.   check your blood sugar twice a day.  vary the time of day when you check, between before the 3 meals, and at bedtime.  also check if you have symptoms of your blood sugar being too high or too low.  please keep a record of the readings and bring it to your next appointment here (or you can bring the meter itself).  You can write it on any piece of paper.  please call us sooner if your blood sugar goes below 70, or if you have a lot of readings over 200.

## 2020-12-09 NOTE — Telephone Encounter (Signed)
Document faxed in to office for provider to fill out ([employer] - FMLA 7 pages) Document put at front office tray under providers name.

## 2020-12-09 NOTE — Telephone Encounter (Signed)
Left message for patient to call back  

## 2020-12-09 NOTE — Progress Notes (Signed)
Subjective:    Patient ID: Lori Mclaughlin, female    DOB: Jan 14, 1961, 60 y.o.   MRN: [id number]  HPI telehealth visit today via telephone x 9 minutes.  Alternatives to telehealth are presented to this patient, and the patient agrees to the telehealth visit. Pt is advised of the cost of the visit, and agrees to this, also.   Patient is at home, and I am at the office.   Persons attending the telehealth visit: the patient and I Pt returns for f/u of diabetes mellitus: DM type: Insulin-requiring type 2.   Dx'ed: 4580 Complications: DR and renal insuff  Therapy: insulin since 2018 GDM: 1984 and 1987 DKA: never Severe hypoglycemia: never Pancreatitis: never Pancreatic imaging: never Other: she takes qd insulin, after poor results with multiple daily injections.   Interval history:  pt states she takes meds intermittently, including insulin.  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  She is back on meds x 4 days.  Since then, she says cbg's are in the low-100's.   Past Medical History:  Diagnosis Date  . Allergic rhinitis   . Asthma   . Atypical chest pain    a. 03/2014: normal nuclear stress test.  . Chronic diastolic CHF (congestive heart failure) ([place])    a. Dx 03/2014 with echo - moderate LVH, EF 99-83%, grade 1 diastolic dysfunction, mildly dilated LA.  . CKD (chronic kidney disease), stage II   . Diabetes mellitus type II   . GERD (gastroesophageal reflux disease)   . Headache(784.0)    when my bp is up  . Hypertension   . Hypertensive heart disease   . Microcytic anemia   . Osteoarthritis     Past Surgical History:  Procedure Laterality Date  . SHOULDER ARTHROSCOPY Bilateral   . TUBAL LIGATION      Social History   Socioeconomic History  . Marital status: Divorced    Spouse name: Not on file  . Number of children: Not on file  . Years of education: Not on file  . Highest education level: Not on file  Occupational  History  . Not on file  Tobacco Use  . Smoking status: Former Smoker    Quit date: 08/29/2010    Years since quitting: 10.2  . Smokeless tobacco: Never Used  Substance and Sexual Activity  . Alcohol use: No    Alcohol/week: 5.0 standard drinks    Types: 5 Glasses of wine per week    Comment: quit 4 years  . Drug use: No  . Sexual activity: Yes    Birth control/protection: Surgical  Other Topics Concern  . Not on file  Social History Narrative   Regular Exercise- yes   Social Determinants of Health   Financial Resource Strain: Not on file  Food Insecurity: Not on file  Transportation Needs: Not on file  Physical Activity: Not on file  Stress: Not on file  Social Connections: Not on file  Intimate Partner Violence: Not on file    Current Outpatient Medications on File Prior to Visit  Medication Sig Dispense Refill  . allopurinol (ZYLOPRIM) 300 MG tablet TAKE 1 TABLET BY MOUTH  DAILY 90 tablet 3  . amLODipine (NORVASC) 10 MG tablet Take 1 tablet (10 mg total) by mouth daily. 90 tablet 1  . aspirin EC 81 MG EC tablet Take 1 tablet (81 mg total) by mouth daily.    . Blood Glucose Monitoring  Suppl (ACCU-CHEK GUIDE) w/Device KIT 1 each by Does not apply route 2 (two) times daily. Use to monitor blood glucose level twice per day 1 kit 0  . carvedilol (COREG) 25 MG tablet Take 1 tablet (25 mg total) by mouth 2 (two) times daily with a meal. Please make overdue appt with Dr. Radford Pax before anymore refills. Thank you 1st attempt 60 tablet 0  . chlorthalidone (HYGROTON) 50 MG tablet Take 1 tablet (50 mg total) by mouth daily. Please make overdue appt with Dr. Radford Pax before anymore refills. Thank you 1st attempt 30 tablet 0  . glucose blood test strip Use to monitor blood glucose level twice per day 100 each 12  . hydrALAZINE (APRESOLINE) 25 MG tablet Take 1 tablet (25 mg total) by mouth 3 (three) times daily. Please make overdue appt with Dr. Radford Pax before anymore refills. Thank you 1st  attempt 90 tablet 0  . HYDROcodone-acetaminophen (NORCO) 5-325 MG tablet Take 1 tablet by mouth every 6 (six) hours as needed for moderate pain. 20 tablet 0  . indomethacin (INDOCIN) 50 MG capsule TAKE 1 CAPSULE(50 MG) BY MOUTH THREE TIMES DAILY AS NEEDED 60 capsule 0  . KOMBIGLYZE XR 2.11-998 MG TB24 TAKE 2 TABLETS BY MOUTH IN  THE MORNING 180 tablet 0  . Lancets (ACCU-CHEK SOFT TOUCH) lancets Use to monitor blood glucose level twice per day 100 each 12  . TOUJEO MAX SOLOSTAR 300 UNIT/ML Solostar Pen INJECT 70 UNITS INTO THE  SKIN IN THE MORNING 24 mL 3   No current facility-administered medications on file prior to visit.    Allergies  Allergen Reactions  . Latex     REACTION: itching,rash  . Lisinopril     REACTION: headache    Family History  Problem Relation Age of Onset  . Stroke Mother   . Cancer Father   . Hypertension Brother   . Diabetes Other   . Hypertension Other   . Stroke Other   . Cancer Other        colon; prostate  . Kidney disease Neg Hx   . Alcohol abuse Neg Hx   . Asthma Neg Hx   . COPD Neg Hx   . Depression Neg Hx   . Drug abuse Neg Hx   . Early death Neg Hx   . Hearing loss Neg Hx   . Heart disease Neg Hx   . Hyperlipidemia Neg Hx     Ht 5' (1.524 m)   Wt 276 lb (125.2 kg)   LMP 02/23/2012   BMI 53.90 kg/m    Review of Systems She denies hypoglycemia    Objective:   Physical Exam   Lab Results  Component Value Date   HGBA1C 15.8 Repeated and verified X2. (H) 12/06/2020       Assessment & Plan:  Insulin-requiring type 2 DM: uncontrolled.   therapy limited by noncompliance with insulin.     Patient Instructions  Please continue the same medications.   I have sent a prescription to your pharmacy, for the continuous glucose monitor.  Please come back for a follow-up appointment in 2 months.   check your blood sugar twice a day.  vary the time of day when you check, between before the 3 meals, and at bedtime.  also check if you  have symptoms of your blood sugar being too high or too low.  please keep a record of the readings and bring it to your next appointment here (or you can bring the  meter itself).  You can write it on any piece of paper.  please call us sooner if your blood sugar goes below 70, or if you have a lot of readings over 200.

## 2020-12-11 ENCOUNTER — Telehealth: Payer: Self-pay | Admitting: Family

## 2020-12-11 MED ORDER — ESCITALOPRAM OXALATE 10 MG PO TABS: 10.0000 mg | ORAL_TABLET | Freq: Every day | ORAL | 0 refills | Status: DC

## 2020-12-11 NOTE — Telephone Encounter (Signed)
FYI to provider and also I have resent her Rx to [facility] per provider request.

## 2020-12-11 NOTE — Telephone Encounter (Signed)
FYI Patient would like to let you know her next appointment with psychology is on 12/24/20 & 12/31/20.   According to pharmacy they have not received medication. Please re-send  escitalopram (LEXAPRO) 10 MG tablet [[id number]]    [id number] [place], [place] - [address] AT [facility] [address]  [address] [place], [place] [address]  Phone:  [phone] Fax:  [phone]

## 2020-12-11 NOTE — Telephone Encounter (Signed)
I have completed the form to the best of my ability and called the pt for some answers as well. Please look over and complete the remaining areas.   Forms has been placed in your yellow folder for review.

## 2020-12-13 DIAGNOSIS — Z0279 Encounter for issue of other medical certificate: Secondary | ICD-10-CM

## 2020-12-13 NOTE — Telephone Encounter (Signed)
Called patient, she answered and then call was dropped. Attempted to call back patient x2. Left message for patient to call a back.

## 2020-12-16 NOTE — Telephone Encounter (Signed)
Spoke with the patient who states that when she saw her PCP recently her BP was 160/108. Her PCP advised her to call our office to make an appointment. The patient states that she had an unexpected death in her family and she has been under a lot of stress. She thinks that this was the cause of her elevated BP. She reports that it has not been that high since. She states that it has been running 140's/90's.  Patient has an appointment with Dr. Mayford Knife in July.

## 2020-12-24 ENCOUNTER — Ambulatory Visit: Payer: 59 | Admitting: Psychology

## 2020-12-30 ENCOUNTER — Ambulatory Visit (INDEPENDENT_AMBULATORY_CARE_PROVIDER_SITE_OTHER): Payer: 59 | Admitting: Psychology

## 2020-12-30 DIAGNOSIS — F4321 Adjustment disorder with depressed mood: Secondary | ICD-10-CM

## 2021-01-02 ENCOUNTER — Other Ambulatory Visit: Payer: Self-pay | Admitting: Cardiology

## 2021-01-02 ENCOUNTER — Ambulatory Visit: Payer: 59 | Admitting: Cardiology

## 2021-01-02 ENCOUNTER — Encounter: Payer: Self-pay | Admitting: Cardiology

## 2021-01-02 ENCOUNTER — Other Ambulatory Visit: Payer: Self-pay

## 2021-01-02 ENCOUNTER — Ambulatory Visit: Payer: 59 | Admitting: Family

## 2021-01-02 VITALS — BP 222/99 | HR 69 | Ht 60.0 in | Wt 280.6 lb

## 2021-01-02 DIAGNOSIS — I10 Essential (primary) hypertension: Secondary | ICD-10-CM | POA: Diagnosis not present

## 2021-01-02 DIAGNOSIS — G4733 Obstructive sleep apnea (adult) (pediatric): Secondary | ICD-10-CM

## 2021-01-02 DIAGNOSIS — I5032 Chronic diastolic (congestive) heart failure: Secondary | ICD-10-CM | POA: Diagnosis not present

## 2021-01-02 DIAGNOSIS — R030 Elevated blood-pressure reading, without diagnosis of hypertension: Secondary | ICD-10-CM

## 2021-01-02 LAB — BASIC METABOLIC PANEL
BUN/Creatinine Ratio: 16 (ref 12–28)
BUN: 31 mg/dL — ABNORMAL HIGH (ref 8–27)
CO2: 20 mmol/L (ref 20–29)
Calcium: 8.9 mg/dL (ref 8.7–10.3)
Chloride: 108 mmol/L — ABNORMAL HIGH (ref 96–106)
Creatinine, Ser: 1.93 mg/dL — ABNORMAL HIGH (ref 0.57–1.00)
Glucose: 85 mg/dL (ref 65–99)
Potassium: 4.5 mmol/L (ref 3.5–5.2)
Sodium: 143 mmol/L (ref 134–144)
eGFR: 29 mL/min/{1.73_m2} — ABNORMAL LOW (ref >59)

## 2021-01-02 MED ORDER — HYDRALAZINE HCL 50 MG PO TABS
50.0000 mg | ORAL_TABLET | Freq: Three times a day (TID) | ORAL | 3 refills | Status: DC
Start: 2021-01-02 — End: 2021-01-03

## 2021-01-02 NOTE — Progress Notes (Signed)
Cardiology Consult Note    Date:  01/02/2021   ID:  MAYERLI KIRST, DOB Dec 05, 1960, MRN [id number]  PCP:  Marrian Salvage, FNP  Cardiologist:  Fransico Him, MD   Chief Complaint  Patient presents with   Congestive Heart Failure   Hypertension   Sleep Apnea    History of Present Illness:  JALEISA BROSE is a 60 y.o. female  with a hx of asthma, atypical CP with normal nuclear stress test 2951, chronic diastolic CHF, CKD stage 2, DM2, GERD and HTN.  She recently has had problems with BP control and was referred for evaluation.     At last OV we stopped telmisartan due to chronic HA on ARBs (had to stop Losartan in the past), increased Hydralazine to 48m TID, changed Bystolic to Carvedilol 288CZBID and increased chlorthalidone to 572mdaily.  Renal dopplers showed no evidence of RAS.  Plasma renin/Aldo/PRA were normal, TSH was normal.  She also underwent sleep study which showed severe OSA with an AHI of 77/hr and nocturnal hypoxemia with O2 sats as low as 80%.  She never followed through with the CPAP titration.   She is here today for followup and is doing well.  She has chronic DOE which is stable and related to obesity.  She denies any chest pain or pressure, PND, orthopnea, LE edema, dizziness, palpitations or syncope. She is compliant with Her meds and is tolerating meds with no SE.     Past Medical History:  Diagnosis Date   Allergic rhinitis    Asthma    Atypical chest pain    a. 03/2014: normal nuclear stress test.   Chronic diastolic CHF (congestive heart [place]) [place]   a. Dx 03/2014 with echo - moderate LVH, EF 5566-06%grade 1 diastolic dysfunction, mildly dilated LA.   CKD (chronic kidney disease), stage II    Diabetes mellitus type II    GERD (gastroesophageal reflux disease)    Headache(784.0)    when my bp is up   Hypertension    Hypertensive heart disease    Microcytic anemia    OSA (obstructive sleep apnea)    severe with AHI 77/hr with  nocturnal hypoxemia   Osteoarthritis     Past Surgical History:  Procedure Laterality Date   SHOULDER ARTHROSCOPY Bilateral    TUBAL LIGATION      Current Medications: Current Meds  Medication Sig   allopurinol (ZYLOPRIM) 300 MG tablet TAKE 1 TABLET BY MOUTH  DAILY   amLODipine (NORVASC) 10 MG tablet Take 1 tablet (10 mg total) by mouth daily.   aspirin EC 81 MG EC tablet Take 1 tablet (81 mg total) by mouth daily.   Blood Glucose Monitoring Suppl (ACCU-CHEK GUIDE) w/Device KIT 1 each by Does not apply route 2 (two) times daily. Use to monitor blood glucose level twice per day   carvedilol (COREG) 25 MG tablet Take 1 tablet (25 mg total) by mouth 2 (two) times daily with a meal. Please make overdue appt with Dr. TuRadford Paxefore anymore refills. Thank you 1st attempt   chlorthalidone (HYGROTON) 50 MG tablet Take 1 tablet (50 mg total) by mouth daily. Please make overdue appt with Dr. TuRadford Paxefore anymore refills. Thank you 1st attempt   Continuous Blood Gluc Receiver (FREESTYLE LIBRE 2 READER) DEVI    Continuous Blood Gluc Sensor (FREESTYLE LIBRE 2 SENSOR) MISC 1 Device by Does not apply route every 14 (fourteen) days.   escitalopram (LEXAPRO) 10 MG tablet Take  1 tablet (10 mg total) by mouth daily.   glucose blood test strip Use to monitor blood glucose level twice per day   hydrALAZINE (APRESOLINE) 25 MG tablet Take 1 tablet (25 mg total) by mouth 3 (three) times daily. Please make overdue appt with Dr. Radford Pax before anymore refills. Thank you 1st attempt   HYDROcodone-acetaminophen (NORCO) 5-325 MG tablet Take 1 tablet by mouth every 6 (six) hours as needed for moderate pain.   indomethacin (INDOCIN) 50 MG capsule TAKE 1 CAPSULE(50 MG) BY MOUTH THREE TIMES DAILY AS NEEDED   KOMBIGLYZE XR 2.11-998 MG TB24 TAKE 2 TABLETS BY MOUTH IN  THE MORNING   Lancets (ACCU-CHEK SOFT TOUCH) lancets Use to monitor blood glucose level twice per day   TOUJEO MAX SOLOSTAR 300 UNIT/ML Solostar Pen INJECT 70  UNITS INTO THE  SKIN IN THE MORNING    Allergies:   Latex and Lisinopril   Social History   Socioeconomic History   Marital status: Divorced    Spouse name: Not on file   Number of children: Not on file   Years of education: Not on file   Highest education level: Not on file  Occupational History   Not on file  Tobacco Use   Smoking status: Former    Pack years: 0.00    Types: Cigarettes    Quit date: 08/29/2010    Years since quitting: 10.3   Smokeless tobacco: Never  Substance and Sexual Activity   Alcohol use: No    Alcohol/week: 5.0 standard drinks    Types: 5 Glasses of wine per week    Comment: quit 4 years   Drug use: No   Sexual activity: Yes    Birth control/protection: Surgical  Other Topics Concern   Not on file  Social History Narrative   Regular Exercise- yes   Social Determinants of Health   Financial Resource Strain: Not on file  Food Insecurity: Not on file  Transportation Needs: Not on file  Physical Activity: Not on file  Stress: Not on file  Social Connections: Not on file     Family History:  The patient's family history includes Cancer in her father and another family member; Diabetes in an other family member; Hypertension in her brother and another family member; Stroke in her mother and another family member.   ROS:   Please see the history of present illness.    ROS All other systems reviewed and are negative.  No flowsheet data found.   PHYSICAL EXAM:   VS:  BP (!) 222/99   Pulse 69   Ht 5' (1.524 m)   Wt 280 lb 9.6 oz (127.3 kg)   LMP 02/23/2012   BMI 54.80 kg/m    GEN: Well nourished, well developed in no acute distress HEENT: Normal NECK: No JVD; No carotid bruits LYMPHATICS: No lymphadenopathy CARDIAC:RRR, no murmurs, rubs, gallops RESPIRATORY:  Clear to auscultation without rales, wheezing or rhonchi  ABDOMEN: Soft, non-tender, non-distended MUSCULOSKELETAL:  No edema; No deformity  SKIN: Warm and dry NEUROLOGIC:   Alert and oriented x 3 PSYCHIATRIC:  Normal affect   Wt Readings from Last 3 Encounters:  01/02/21 280 lb 9.6 oz (127.3 kg)  12/09/20 276 lb (125.2 kg)  12/06/20 276 lb 12.8 oz (125.6 kg)      Studies/Labs Reviewed:   EKG:  EKG is ordered today and showed NSR with rSR' and LVH by voltage with repol abnormality  Recent Labs: 12/06/2020: ALT 10; BUN 36; Creatinine, Ser 2.03;  Hemoglobin 10.7; Platelets 201.0; Potassium 4.7; Sodium 132   Lipid Panel    Component Value Date/Time   CHOL 189 12/06/2020 1038   TRIG 158.0 (H) 12/06/2020 1038   HDL 47.90 12/06/2020 1038   CHOLHDL 4 12/06/2020 1038   VLDL 31.6 12/06/2020 1038   LDLCALC 109 (H) 12/06/2020 1038    Additional studies/ records that were reviewed today include:  OV notes from PCP, Sleep study, PAP download, renal duplex, labs  ASSESSMENT:    1. Chronic diastolic CHF (congestive heart failure), NYHA class 2 ([place])   2. Essential hypertension, malignant   3. Hyperlipidemia with target LDL less than 70      PLAN:  In order of problems listed above:  1.  Chronic diastolic CHF -she appears euvolemic on exam today -Continue prescription drug management with Chlorthalidone 68m daily  -I have personally reviewed and interpreted outside labs performed by patient's PCP which showed SCr 2.03 and K+ 4.7   2.  HTN -BP poorly controlled on exam today>>she did not take her BP meds this am -renal duplex  with no RAS and plasma renin/Aldo and PRA and TSH were normal -24 hour urine for catechols, VMA, metanephrines, dopamine, cortisol were ordered but not done>>I have reordered this -Continue prescription drug management with Chlorthalidone 563mdaily, Carvedilol 2593mID and Amlodipine 56m95mily -increase Hydralazine to 50mg51m -encouraged compliance with her meds -discussed with her the driving force of untreated OSA on HTN and encouraged her to proceed with CPAP titration -check 48 hour BP monitor  3.  OSA  -sleep study  showed severe OSA with nocturnal hypoxemia -she never followed through on her CPAP titration -she has poorly controlled DM, poorly controlled HTN and I stressed the importance of getting on CPCP to control OSA which is a driving force for her other medical problems -I will set her up for in lab CPAP titration    Medication Adjustments/Labs and Tests Ordered: Current medicines are reviewed at length with the patient today.  Concerns regarding medicines are outlined above.  Medication changes, Labs and Tests ordered today are listed in the Patient Instructions below.  There are no Patient Instructions on file for this visit.    Signed, TraciFransico Him 01/02/2021 10:16 AM    [facility] [place] [phone]: [phone]: [phone]

## 2021-01-02 NOTE — Patient Instructions (Incomplete)
Medication Instructions:  Your physician has recommended you make the following change in your medication: 1) INCREASE hydralazine to 50 mg three times per day  *If you need a refill on your cardiac medications before your next appointment, please call your pharmacy*   Lab Work: TODAY: BMET and 24 hour urine  If you have labs (blood work) drawn today and your tests are completely normal, you will receive your results only by: MyChart Message (if you have MyChart) OR A paper copy in the mail If you have any lab test that is abnormal or we need to change your treatment, we will call you to review the results.   Testing/Procedures: Your provider would like for you to wear a 48 hour blood pressure monitor.  Your provider would like for you have a CPAP titration - Coralee North will be in contact with you to set this up.    Follow-Up: At [facility], you and your health needs are our priority.  As part of our continuing mission to provide you with exceptional heart care, we have created designated Provider Care Teams.  These Care Teams include your primary Cardiologist (physician) and Advanced Practice Providers (APPs -  Physician Assistants and Nurse Practitioners) who all work together to provide you with the care you need, when you need it.  We recommend signing up for the patient portal called "MyChart".  Sign up information is provided on this After Visit Summary.  MyChart is used to connect with patients for Virtual Visits (Telemedicine).  Patients are able to view lab/test results, encounter notes, upcoming appointments, etc.  Non-urgent messages can be sent to your provider as well.   To learn more about what you can do with MyChart, go to [email or web address].    Your next appointment:   {numbers 1-12:10294} {Time; day/wk/mo/yr(s):9076}  The format for your next appointment:   {F/U Visit Format          :[id number]}  Provider:   {Providers/Teams        :[id number]}   Other  Instructions

## 2021-01-02 NOTE — Addendum Note (Signed)
Addended by: Theresia Majors on: 01/02/2021 10:28 AM   Modules accepted: Orders

## 2021-01-03 ENCOUNTER — Telehealth: Payer: Self-pay | Admitting: Cardiology

## 2021-01-03 DIAGNOSIS — I5032 Chronic diastolic (congestive) heart failure: Secondary | ICD-10-CM

## 2021-01-03 DIAGNOSIS — I10 Essential (primary) hypertension: Secondary | ICD-10-CM

## 2021-01-03 MED ORDER — HYDRALAZINE HCL 50 MG PO TABS
75.0000 mg | ORAL_TABLET | Freq: Three times a day (TID) | ORAL | 3 refills | Status: DC
Start: 2021-01-03 — End: 2021-02-28

## 2021-01-03 NOTE — Telephone Encounter (Signed)
The patient has been notified of the result and verbalized understanding.  All questions (if any) were answered. Theresia Majors, RN 01/03/2021 1:00 PM  Patient will stop taking chlorthalidone and increase hydralazine to 75 mg TID. She is taking indomethacin which I advised her to discontinue. She is going to follow up with her PCP on other options to treat her gout.  She will repeat BMET next Friday.

## 2021-01-03 NOTE — Telephone Encounter (Signed)
Patient was returning call for results 

## 2021-01-06 ENCOUNTER — Other Ambulatory Visit: Payer: Self-pay

## 2021-01-06 ENCOUNTER — Telehealth: Payer: Self-pay | Admitting: *Deleted

## 2021-01-06 ENCOUNTER — Ambulatory Visit (INDEPENDENT_AMBULATORY_CARE_PROVIDER_SITE_OTHER): Payer: 59

## 2021-01-06 DIAGNOSIS — R030 Elevated blood-pressure reading, without diagnosis of hypertension: Secondary | ICD-10-CM | POA: Diagnosis not present

## 2021-01-06 DIAGNOSIS — I10 Essential (primary) hypertension: Secondary | ICD-10-CM

## 2021-01-06 DIAGNOSIS — G4733 Obstructive sleep apnea (adult) (pediatric): Secondary | ICD-10-CM

## 2021-01-06 NOTE — Addendum Note (Signed)
Addended by: Madalyn Rob A on: 01/06/2021 01:02 PM   Modules accepted: Orders

## 2021-01-06 NOTE — Telephone Encounter (Signed)
Prior Authorization for sent to titration via web portal. Tracking Number [id number].

## 2021-01-07 ENCOUNTER — Ambulatory Visit (INDEPENDENT_AMBULATORY_CARE_PROVIDER_SITE_OTHER): Payer: 59 | Admitting: Psychology

## 2021-01-07 DIAGNOSIS — F4321 Adjustment disorder with depressed mood: Secondary | ICD-10-CM

## 2021-01-15 LAB — HM DIABETES EYE EXAM

## 2021-01-16 ENCOUNTER — Other Ambulatory Visit: Payer: Self-pay | Admitting: Cardiology

## 2021-01-16 ENCOUNTER — Other Ambulatory Visit: Payer: Self-pay | Admitting: Family

## 2021-01-17 ENCOUNTER — Ambulatory Visit: Payer: 59 | Admitting: Psychology

## 2021-01-21 ENCOUNTER — Telehealth: Payer: Self-pay | Admitting: Cardiology

## 2021-01-21 DIAGNOSIS — I10 Essential (primary) hypertension: Secondary | ICD-10-CM

## 2021-01-21 DIAGNOSIS — R030 Elevated blood-pressure reading, without diagnosis of hypertension: Secondary | ICD-10-CM

## 2021-01-21 NOTE — Telephone Encounter (Signed)
Spoke with the patient who states that after increasing hydralazine to 75 mg TID she had diarrhea and cramping. She states that she reduced her dose back to 50 mg TID for a few days last week while she was getting some testing done. On Saturday she increased the dose back to 75 TID and has had diarrhea and cramping again ever since. Patient would like to go back to hydralazine 50 mg TID.

## 2021-01-21 NOTE — Telephone Encounter (Signed)
Pt c/o medication issue:  1. Name of Medication: hydrALAZINE (APRESOLINE) 50 MG tablet  2. How are you currently taking this medication (dosage and times per day)? As prescribed  3. Are you having a reaction (difficulty breathing--STAT)? Headache diarrhea  4. What is your medication issue? PT is stating the medication is making her sick.

## 2021-01-22 NOTE — Telephone Encounter (Signed)
Spoke with the patient and advised her that she has been referred to Dr. Duke Salvia with [facility] clinic. Patient verbalized understanding

## 2021-01-23 ENCOUNTER — Other Ambulatory Visit: Payer: Self-pay | Admitting: Cardiology

## 2021-01-27 ENCOUNTER — Other Ambulatory Visit: Payer: Self-pay | Admitting: Endocrinology

## 2021-01-29 ENCOUNTER — Ambulatory Visit (INDEPENDENT_AMBULATORY_CARE_PROVIDER_SITE_OTHER): Payer: 59 | Admitting: Psychology

## 2021-01-29 DIAGNOSIS — F4321 Adjustment disorder with depressed mood: Secondary | ICD-10-CM | POA: Diagnosis not present

## 2021-02-03 ENCOUNTER — Ambulatory Visit (HOSPITAL_BASED_OUTPATIENT_CLINIC_OR_DEPARTMENT_OTHER): Payer: 59 | Admitting: Cardiovascular Disease

## 2021-02-05 LAB — CATECHOLAMINES, FRACTIONATED, URINE, 24 HOUR
Dopamine , 24H Ur: 136 ug/24 hr (ref 0–510)
Dopamine, Rand Ur: 181 ug/L
Epinephrine, 24H Ur: 0 ug/24 hr (ref 0–20)
Epinephrine, Rand Ur: <1 ug/L
Norepinephrine, 24H Ur: 69 ug/24 hr (ref 0–135)
Norepinephrine, Rand Ur: 92 ug/L

## 2021-02-05 LAB — VANILLYLMANDELIC ACID, 24-HR U
VMA, 24H Ur Adult: 1.4 mg/24 hr (ref 0.0–7.5)
VMA, Urine: 1.9 mg/L

## 2021-02-05 LAB — METANEPHRINES, URINE, 24 HOUR
Metaneph Total, Ur: 44 ug/L
Metanephrines, 24H Ur: 33 ug/24 hr — ABNORMAL LOW (ref 36–209)
Normetanephrine, 24H Ur: 260 ug/24 hr (ref 131–612)
Normetanephrine, Ur: 347 ug/L

## 2021-02-11 ENCOUNTER — Other Ambulatory Visit: Payer: Self-pay

## 2021-02-11 ENCOUNTER — Encounter: Payer: 59 | Admitting: Cardiology

## 2021-02-11 ENCOUNTER — Ambulatory Visit: Payer: 59 | Admitting: Cardiology

## 2021-02-11 NOTE — Progress Notes (Signed)
This encounter was created in error - please disregard.

## 2021-02-12 ENCOUNTER — Ambulatory Visit (INDEPENDENT_AMBULATORY_CARE_PROVIDER_SITE_OTHER): Payer: 59 | Admitting: Psychology

## 2021-02-12 DIAGNOSIS — F4321 Adjustment disorder with depressed mood: Secondary | ICD-10-CM

## 2021-02-26 ENCOUNTER — Telehealth: Payer: Self-pay | Admitting: *Deleted

## 2021-02-26 NOTE — Telephone Encounter (Signed)
Spoke with patient and got her scheduled on [facility] clinic Patient aware of date, time and location

## 2021-02-26 NOTE — Telephone Encounter (Signed)
-----   Message from Chilton Si, MD sent at 02/24/2021  4:40 PM EDT ----- Can she do 4 on 8/23?  TCR ----- Message ----- From: Burnell Blanks, LPN Sent: 08/29/6331   1:04 PM EDT To: Chilton Si, MD  Hi Dr Duke Salvia   Received message from Dr Baldemar Lenis nurse about getting this patient seen in ADV HTN clinic ASAP You are completely full for your next several Doubt you want to work her in but thought I would ask Sending you another on a [place] patient as well  Sorry   Juliette Alcide

## 2021-02-27 ENCOUNTER — Ambulatory Visit: Payer: 59 | Admitting: Psychology

## 2021-02-27 ENCOUNTER — Other Ambulatory Visit: Payer: Self-pay | Admitting: Cardiology

## 2021-02-27 ENCOUNTER — Telehealth: Payer: Self-pay | Admitting: Cardiology

## 2021-02-27 NOTE — Telephone Encounter (Signed)
Left message for patient to call back  

## 2021-02-27 NOTE — Telephone Encounter (Signed)
She is requesting this be faxed directly to her job. The fax number is [phone]. Please list the incident # on the fax which is [id number].

## 2021-02-27 NOTE — Telephone Encounter (Signed)
Lori Mclaughlin is calling requesting a work note. She states the medication she is on messes up her stomach very bad and she's had several accidents due to this while at work. She would like the note to request she be allowed to work from home due to this. Please advise.

## 2021-02-27 NOTE — Telephone Encounter (Signed)
Spoke with the patient who is requesting a note to allow her to work from home. She states that the hydralazine is still causing her to have diarrhea. She states that she has had several accidents at work that have caused her to miss time and she is having to get up frequently. Patient states that she tried reducing her hydralazine to 50mg  and her stomach felt better however her blood pressure was not controlled so she has gone back to the 75 mg TID. She states that she would just like a note to work from home until she is able to see Dr. on 8/23.

## 2021-02-28 ENCOUNTER — Telehealth: Payer: Self-pay

## 2021-02-28 MED ORDER — DOXAZOSIN MESYLATE 1 MG PO TABS: 1.0000 mg | ORAL_TABLET | Freq: Every day | ORAL | 3 refills | Status: DC

## 2021-02-28 MED ORDER — HYDRALAZINE HCL 50 MG PO TABS: 50.0000 mg | ORAL_TABLET | Freq: Three times a day (TID) | ORAL | 3 refills | Status: DC

## 2021-02-28 NOTE — Telephone Encounter (Signed)
Pt states that she is having wheezing and would like an inhaler sent in for her.

## 2021-02-28 NOTE — Telephone Encounter (Signed)
Patient given recommendations per Dr. Mayford Knife. Patient verbalized understanding. Rx for doxazosin have been sent in. Patient will call next week with BP readings.

## 2021-02-28 NOTE — Telephone Encounter (Signed)
I have spoken to the provider.   I have called the pt and informed her that due to her heart history, if she is wheezing badly, we recommend she either got the urgent care to be seen today or make an appointment to discuss what is going on. Pt reports that it is only in the Am and would like to make an appointment since the wheezing has gone away for now. We have scheduled her for a OV on 03/07/21; however, she is to go to the urgent care if the wheezing comes back. Pt stated understanding.

## 2021-03-03 NOTE — Telephone Encounter (Addendum)
Ok to schedule titration valid dates are 01-06-21/ 04-06-21 aut #[id number].  Date extension is now through 04/25/21 per Miachel Roux at [facility]. Reference [phone]

## 2021-03-07 ENCOUNTER — Inpatient Hospital Stay (HOSPITAL_BASED_OUTPATIENT_CLINIC_OR_DEPARTMENT_OTHER)
Admission: EM | Admit: 2021-03-07 | Discharge: 2021-03-14 | DRG: 291 | Disposition: A | Discharge status: Home / Self Care | Payer: 59 | Attending: Internal Medicine | Admitting: Internal Medicine

## 2021-03-07 ENCOUNTER — Encounter (HOSPITAL_BASED_OUTPATIENT_CLINIC_OR_DEPARTMENT_OTHER): Payer: Self-pay

## 2021-03-07 ENCOUNTER — Other Ambulatory Visit: Payer: Self-pay

## 2021-03-07 ENCOUNTER — Encounter: Payer: Self-pay | Admitting: Family

## 2021-03-07 ENCOUNTER — Emergency Department (HOSPITAL_BASED_OUTPATIENT_CLINIC_OR_DEPARTMENT_OTHER): Payer: 59

## 2021-03-07 ENCOUNTER — Ambulatory Visit (INDEPENDENT_AMBULATORY_CARE_PROVIDER_SITE_OTHER): Payer: 59 | Admitting: Family

## 2021-03-07 VITALS — BP 162/90 | HR 87 | Temp 97.6°F | Ht 60.0 in | Wt 285.0 lb

## 2021-03-07 DIAGNOSIS — I5033 Acute on chronic diastolic (congestive) heart failure: Secondary | ICD-10-CM | POA: Diagnosis present

## 2021-03-07 DIAGNOSIS — Z6841 Body Mass Index (BMI) 40.0 and over, adult: Secondary | ICD-10-CM | POA: Diagnosis not present

## 2021-03-07 DIAGNOSIS — Z794 Long term (current) use of insulin: Secondary | ICD-10-CM

## 2021-03-07 DIAGNOSIS — Z833 Family history of diabetes mellitus: Secondary | ICD-10-CM

## 2021-03-07 DIAGNOSIS — J45909 Unspecified asthma, uncomplicated: Secondary | ICD-10-CM | POA: Diagnosis present

## 2021-03-07 DIAGNOSIS — I161 Hypertensive emergency: Secondary | ICD-10-CM | POA: Diagnosis present

## 2021-03-07 DIAGNOSIS — E1129 Type 2 diabetes mellitus with other diabetic kidney complication: Secondary | ICD-10-CM | POA: Diagnosis present

## 2021-03-07 DIAGNOSIS — Z79899 Other long term (current) drug therapy: Secondary | ICD-10-CM | POA: Diagnosis not present

## 2021-03-07 DIAGNOSIS — Z8249 Family history of ischemic heart disease and other diseases of the circulatory system: Secondary | ICD-10-CM

## 2021-03-07 DIAGNOSIS — E1122 Type 2 diabetes mellitus with diabetic chronic kidney disease: Secondary | ICD-10-CM | POA: Diagnosis present

## 2021-03-07 DIAGNOSIS — R609 Edema, unspecified: Secondary | ICD-10-CM | POA: Diagnosis not present

## 2021-03-07 DIAGNOSIS — Z09 Encounter for follow-up examination after completed treatment for conditions other than malignant neoplasm: Secondary | ICD-10-CM

## 2021-03-07 DIAGNOSIS — Z91040 Latex allergy status: Secondary | ICD-10-CM | POA: Diagnosis not present

## 2021-03-07 DIAGNOSIS — F32A Depression, unspecified: Secondary | ICD-10-CM | POA: Diagnosis present

## 2021-03-07 DIAGNOSIS — R001 Bradycardia, unspecified: Secondary | ICD-10-CM | POA: Diagnosis present

## 2021-03-07 DIAGNOSIS — I509 Heart failure, unspecified: Secondary | ICD-10-CM

## 2021-03-07 DIAGNOSIS — D631 Anemia in chronic kidney disease: Secondary | ICD-10-CM | POA: Diagnosis present

## 2021-03-07 DIAGNOSIS — R0602 Shortness of breath: Secondary | ICD-10-CM | POA: Diagnosis not present

## 2021-03-07 DIAGNOSIS — Z20822 Contact with and (suspected) exposure to covid-19: Secondary | ICD-10-CM | POA: Diagnosis present

## 2021-03-07 DIAGNOSIS — I444 Left anterior fascicular block: Secondary | ICD-10-CM | POA: Diagnosis present

## 2021-03-07 DIAGNOSIS — Z87891 Personal history of nicotine dependence: Secondary | ICD-10-CM | POA: Diagnosis not present

## 2021-03-07 DIAGNOSIS — I5032 Chronic diastolic (congestive) heart failure: Secondary | ICD-10-CM | POA: Diagnosis present

## 2021-03-07 DIAGNOSIS — M109 Gout, unspecified: Secondary | ICD-10-CM | POA: Diagnosis present

## 2021-03-07 DIAGNOSIS — N179 Acute kidney failure, unspecified: Secondary | ICD-10-CM | POA: Diagnosis present

## 2021-03-07 DIAGNOSIS — Z888 Allergy status to other drugs, medicaments and biological substances status: Secondary | ICD-10-CM | POA: Diagnosis not present

## 2021-03-07 DIAGNOSIS — E785 Hyperlipidemia, unspecified: Secondary | ICD-10-CM | POA: Diagnosis present

## 2021-03-07 DIAGNOSIS — G4733 Obstructive sleep apnea (adult) (pediatric): Secondary | ICD-10-CM | POA: Diagnosis present

## 2021-03-07 DIAGNOSIS — N1832 Chronic kidney disease, stage 3b: Secondary | ICD-10-CM | POA: Diagnosis present

## 2021-03-07 DIAGNOSIS — Z7982 Long term (current) use of aspirin: Secondary | ICD-10-CM

## 2021-03-07 DIAGNOSIS — K219 Gastro-esophageal reflux disease without esophagitis: Secondary | ICD-10-CM | POA: Diagnosis present

## 2021-03-07 DIAGNOSIS — E1165 Type 2 diabetes mellitus with hyperglycemia: Secondary | ICD-10-CM | POA: Diagnosis not present

## 2021-03-07 DIAGNOSIS — I130 Hypertensive heart and chronic kidney disease with heart failure and stage 1 through stage 4 chronic kidney disease, or unspecified chronic kidney disease: Secondary | ICD-10-CM | POA: Diagnosis not present

## 2021-03-07 DIAGNOSIS — R7989 Other specified abnormal findings of blood chemistry: Secondary | ICD-10-CM

## 2021-03-07 HISTORY — DX: Chronic diastolic (congestive) heart failure: I50.32

## 2021-03-07 LAB — HEPATIC FUNCTION PANEL
ALT: 19 U/L (ref 0–44)
AST: 24 U/L (ref 15–41)
Albumin: 3.6 g/dL (ref 3.5–5.0)
Alkaline Phosphatase: 77 U/L (ref 38–126)
Bilirubin, Direct: 0.1 mg/dL (ref 0.0–0.2)
Indirect Bilirubin: 0.1 mg/dL — ABNORMAL LOW (ref 0.3–0.9)
Total Bilirubin: 0.2 mg/dL — ABNORMAL LOW (ref 0.3–1.2)
Total Protein: 7.7 g/dL (ref 6.5–8.1)

## 2021-03-07 LAB — BASIC METABOLIC PANEL
Anion gap: 10 (ref 5–15)
BUN: 23 mg/dL (ref 8–23)
CO2: 26 mmol/L (ref 22–32)
Calcium: 8.8 mg/dL — ABNORMAL LOW (ref 8.9–10.3)
Chloride: 102 mmol/L (ref 98–111)
Creatinine, Ser: 1.79 mg/dL — ABNORMAL HIGH (ref 0.44–1.00)
GFR, Estimated: 32 mL/min — ABNORMAL LOW (ref >60)
Glucose, Bld: 243 mg/dL — ABNORMAL HIGH (ref 70–99)
Potassium: 4.3 mmol/L (ref 3.5–5.1)
Sodium: 138 mmol/L (ref 135–145)

## 2021-03-07 LAB — CBC
HCT: 30.2 % — ABNORMAL LOW (ref 36.0–46.0)
Hemoglobin: 9.3 g/dL — ABNORMAL LOW (ref 12.0–15.0)
MCH: 24 pg — ABNORMAL LOW (ref 26.0–34.0)
MCHC: 30.8 g/dL (ref 30.0–36.0)
MCV: 77.8 fL — ABNORMAL LOW (ref 80.0–100.0)
Platelets: 278 10*3/uL (ref 150–400)
RBC: 3.88 MIL/uL (ref 3.87–5.11)
RDW: 17.6 % — ABNORMAL HIGH (ref 11.5–15.5)
WBC: 7.6 10*3/uL (ref 4.0–10.5)
nRBC: 0 % (ref 0.0–0.2)

## 2021-03-07 LAB — TROPONIN I (HIGH SENSITIVITY)
Troponin I (High Sensitivity): 15 ng/L (ref <18)
Troponin I (High Sensitivity): 18 ng/L — ABNORMAL HIGH (ref <18)

## 2021-03-07 LAB — RESP PANEL BY RT-PCR (FLU A&B, COVID) ARPGX2
Influenza A by PCR: NEGATIVE
Influenza B by PCR: NEGATIVE
SARS Coronavirus 2 by RT PCR: NEGATIVE

## 2021-03-07 LAB — MAGNESIUM: Magnesium: 1.6 mg/dL — ABNORMAL LOW (ref 1.7–2.4)

## 2021-03-07 LAB — BRAIN NATRIURETIC PEPTIDE: B Natriuretic Peptide: 631.5 pg/mL — ABNORMAL HIGH (ref 0.0–100.0)

## 2021-03-07 MED ORDER — ASPIRIN 81 MG PO CHEW
324.0000 mg | CHEWABLE_TABLET | Freq: Once | ORAL | Status: AC
  Administered 2021-03-07: 324 mg via ORAL
  Filled 2021-03-07: qty 4

## 2021-03-07 MED ORDER — HYDRALAZINE HCL 20 MG/ML IJ SOLN
20.0000 mg | Freq: Once | INTRAMUSCULAR | Status: AC
  Administered 2021-03-07: 20 mg via INTRAVENOUS
  Filled 2021-03-07: qty 1

## 2021-03-07 MED ORDER — NITROGLYCERIN 2 % TD OINT
1.0000 [in_us] | TOPICAL_OINTMENT | Freq: Four times a day (QID) | TRANSDERMAL | Status: DC
Start: 2021-03-07 — End: 2021-03-12
  Administered 2021-03-07 – 2021-03-12 (×17): 1 [in_us] via TOPICAL
  Filled 2021-03-07: qty 30
  Filled 2021-03-07 (×2): qty 1

## 2021-03-07 MED ORDER — NITROGLYCERIN IN D5W 200-5 MCG/ML-% IV SOLN: 5.0000 ug/min | INTRAVENOUS | Status: DC

## 2021-03-07 MED ORDER — FUROSEMIDE 10 MG/ML IJ SOLN
40.0000 mg | Freq: Once | INTRAMUSCULAR | Status: AC
  Administered 2021-03-07: 40 mg via INTRAVENOUS
  Filled 2021-03-07: qty 4

## 2021-03-07 NOTE — ED Triage Notes (Addendum)
Pt c/o SOB, wheezing, epigastric pain at night-pt denies fever/flu sx-pt with hoarse voice-to triage in w/c-states she was at PCP office in the building and brought to ED-RT in triage for assessment-states she had neg home covid test x in the last week

## 2021-03-07 NOTE — ED Notes (Signed)
RT assessed patient in triage. Patient complains on being more SOB x2 weeks. Hoarse when she is talking, and seems to be more SOB when talking. BBS clear throughout. SAT WNL. RT to monitor as needed

## 2021-03-07 NOTE — ED Notes (Signed)
ED Provider at bedside. 

## 2021-03-07 NOTE — ED Provider Notes (Signed)
[facility] EMERGENCY DEPARTMENT Provider Note   CSN: [id number] Arrival date & time: 03/07/21  1646     History Chief Complaint  Patient presents with   Shortness of Breath    Lori Mclaughlin is a 60 y.o. female.  HPI Patient was seen in her PCP office today for increasing shortness of breath.  Patient is referred down to the emergency department for further diagnostic evaluation and therapeutic management.  Patient reports for about 2 weeks she has had increasing shortness of breath.  Is worse with exertion.  Patient reports she has had about 15 pound weight gain in a 2-week duration.  She has had a lot of swelling develop in her legs beyond what she is ever experienced before.  She reports she is getting some episodes of waxing and waning chest discomfort.  Pressure quality.  Patient reports she has not had any fevers, body aches.  She has had a mild amount of coughing.  Patient reports that she used to be on a diuretic but due to increasing kidney numbers, it was discontinued a while ago.  She denies currently being on any Lasix or diuretics.  Patient reports her blood pressures been chronically very difficult to control.  She reports she has been on many medications.  Currently she reports she is taking carvedilol and hydralazine.  She reports she does take her medications although her timing got late today.  She took her carvedilol and hydralazine at 1 PM.  She reports she is being referred to a hypertension specialist due to difficult to manage blood pressure.    Past Medical History:  Diagnosis Date   Allergic rhinitis    Asthma    Atypical chest pain    a. 03/2014: normal nuclear stress test.   Chronic diastolic CHF (congestive heart failure) ([place])    a. Dx 03/2014 with echo - moderate LVH, EF 22-02%, grade 1 diastolic dysfunction, mildly dilated LA.   CKD (chronic kidney disease), stage II    Diabetes mellitus type II    GERD (gastroesophageal reflux disease)     Headache(784.0)    when my bp is up   Hypertension    Hypertensive heart disease    Microcytic anemia    OSA (obstructive sleep apnea)    severe with AHI 77/hr with nocturnal hypoxemia   Osteoarthritis     Patient Active Problem List   Diagnosis Date Noted   Thiamine deficiency 09/19/2017   Myalgia 03/10/2017   Screen for colon cancer 06/12/2015   Visit for screening mammogram 06/12/2015   Hypersomnia 04/11/2014   Morbid obesity ([place]) 04/11/2014   Bradycardia, drug induced 04/11/2014   Chronic diastolic CHF (congestive heart failure), NYHA class 2 ([place]) 03/30/2014   Low back pain radiating to left leg 01/26/2012   Sleep apnea 11/13/2011   Hyperlipidemia with target LDL less than 70 01/14/2011   Type II diabetes mellitus with renal manifestations, uncontrolled ([place]) 08/05/2009   Deficiency anemia 08/05/2009   Essential hypertension, malignant 08/05/2009   ALLERGIC RHINITIS 08/05/2009   ASTHMA 08/05/2009   GERD 08/05/2009   OSTEOARTHRITIS 08/05/2009    Past Surgical History:  Procedure Laterality Date   SHOULDER ARTHROSCOPY Bilateral    TUBAL LIGATION       OB History   No obstetric history on file.     Family History  Problem Relation Age of Onset   Stroke Mother    Cancer Father    Hypertension Brother    Diabetes Other  Hypertension Other    Stroke Other    Cancer Other        colon; prostate   Kidney disease Neg Hx    Alcohol abuse Neg Hx    Asthma Neg Hx    COPD Neg Hx    Depression Neg Hx    Drug abuse Neg Hx    Early death Neg Hx    Hearing loss Neg Hx    Heart disease Neg Hx    Hyperlipidemia Neg Hx     Social History   Tobacco Use   Smoking status: Former    Types: Cigarettes    Quit date: 08/29/2010    Years since quitting: 10.5   Smokeless tobacco: Never  Vaping Use   Vaping Use: Never used  Substance Use Topics   Alcohol use: No    Alcohol/week: 5.0 standard drinks    Types: 5 Glasses of wine per week   Drug use: No     Home Medications Prior to Admission medications   Medication Sig Start Date End Date Taking? Authorizing Provider  allopurinol (ZYLOPRIM) 300 MG tablet TAKE 1 TABLET BY MOUTH  DAILY 01/30/20   Marrian Salvage, FNP  amLODipine (NORVASC) 10 MG tablet Take 1 tablet (10 mg total) by mouth daily. 02/07/20   Marrian Salvage, FNP  aspirin EC 81 MG EC tablet Take 1 tablet (81 mg total) by mouth daily. 04/01/14   Dunn, Nedra Hai, PA-C  Blood Glucose Monitoring Suppl (ACCU-CHEK GUIDE) w/Device KIT 1 each by Does not apply route 2 (two) times daily. Use to monitor blood glucose level twice per day 06/06/18   Renato Shin, MD  carvedilol (COREG) 25 MG tablet TAKE 1 TABLET BY MOUTH  TWICE DAILY WITH MEALS 02/27/21   Sueanne Margarita, MD  chlorthalidone (HYGROTON) 50 MG tablet TAKE 1 TABLET BY MOUTH  DAILY 02/27/21   Sueanne Margarita, MD  Continuous Blood Gluc Receiver (FREESTYLE LIBRE 2 READER) DEVI USE ONCE AS DIRECTED 01/28/21   Renato Shin, MD  Continuous Blood Gluc Sensor (FREESTYLE LIBRE 2 SENSOR) MISC 1 Device by Does not apply route every 14 (fourteen) days. 12/09/20   Renato Shin, MD  doxazosin (CARDURA) 1 MG tablet Take 1 tablet (1 mg total) by mouth at bedtime. 02/28/21   Sueanne Margarita, MD  escitalopram (LEXAPRO) 10 MG tablet Take 1 tablet (10 mg total) by mouth daily. 12/11/20   Marrian Salvage, FNP  glucose blood test strip Use to monitor blood glucose level twice per day 06/06/18   Renato Shin, MD  hydrALAZINE (APRESOLINE) 50 MG tablet Take 1 tablet (50 mg total) by mouth 3 (three) times daily. 02/28/21   Sueanne Margarita, MD  HYDROcodone-acetaminophen (NORCO) 5-325 MG tablet Take 1 tablet by mouth every 6 (six) hours as needed for moderate pain. 10/13/17   Marrian Salvage, FNP  KOMBIGLYZE XR 2.11-998 MG TB24 TAKE 2 TABLETS BY MOUTH IN  THE MORNING 07/11/20   Renato Shin, MD  Lancets [facility] SOFT [facility]) lancets Use to monitor blood glucose level twice per day 06/06/18    Renato Shin, MD  TOUJEO MAX SOLOSTAR 300 UNIT/ML Solostar Pen INJECT 70 UNITS INTO THE  SKIN IN THE MORNING 05/14/20   Renato Shin, MD    Allergies    Latex and Lisinopril  Review of Systems   Review of Systems 10 systems reviewed negative except as per HPI Physical Exam Updated Vital Signs BP (!) 214/87   Pulse 83  Temp 98.2 F (36.8 C) (Oral)   Resp 20   LMP 02/23/2012   SpO2 99%   Physical Exam Constitutional:      Comments: Alert.  Mild increased work of breathing at rest.  Clear mental status.  HENT:     Head: Normocephalic and atraumatic.     Mouth/Throat:     Pharynx: Oropharynx is clear.  Eyes:     Extraocular Movements: Extraocular movements intact.  Cardiovascular:     Rate and Rhythm: Normal rate and regular rhythm.  Pulmonary:     Comments: Mild increased work of breathing.  Lungs grossly clear.  Cannot appreciate significant crackle or wheeze. Abdominal:     General: There is no distension.     Palpations: Abdomen is soft.     Tenderness: There is no abdominal tenderness. There is no guarding.  Musculoskeletal:     Comments: 1-2+ pitting edema bilateral feet and ankles.  Symmetric in appearance  Skin:    General: Skin is warm and dry.  Neurological:     General: No focal deficit present.     Mental Status: She is oriented to person, place, and time.     Coordination: Coordination normal.  Psychiatric:        Mood and Affect: Mood normal.    ED Results / Procedures / Treatments   Labs (all labs ordered are listed, but only abnormal results are displayed) Labs Reviewed  BASIC METABOLIC PANEL - Abnormal; Notable for the following components:      Result Value   Glucose, Bld 243 (*)    Creatinine, Ser 1.79 (*)    Calcium 8.8 (*)    GFR, Estimated 32 (*)    All other components within normal limits  CBC - Abnormal; Notable for the following components:   Hemoglobin 9.3 (*)    HCT 30.2 (*)    MCV 77.8 (*)    MCH 24.0 (*)    RDW 17.6 (*)     All other components within normal limits  BRAIN NATRIURETIC PEPTIDE - Abnormal; Notable for the following components:   B Natriuretic Peptide 631.5 (*)    All other components within normal limits  HEPATIC FUNCTION PANEL - Abnormal; Notable for the following components:   Total Bilirubin 0.2 (*)    Indirect Bilirubin 0.1 (*)    All other components within normal limits  MAGNESIUM - Abnormal; Notable for the following components:   Magnesium 1.6 (*)    All other components within normal limits  TROPONIN I (HIGH SENSITIVITY) - Abnormal; Notable for the following components:   Troponin I (High Sensitivity) 18 (*)    All other components within normal limits  RESP PANEL BY RT-PCR (FLU A&B, COVID) [id number]  TROPONIN I (HIGH SENSITIVITY)    EKG EKG Interpretation  Date/Time:  Friday March 07 2021 17:04:10 EDT Ventricular Rate:  79 PR Interval:  158 QRS Duration: 92 QT Interval:  426 QTC Calculation: 488 R Axis:   -39 Text Interpretation: Normal sinus rhythm Left axis deviation Cannot rule out Anterior infarct , age undetermined Abnormal ECG no sig change from previous Confirmed by Charlesetta Shanks [phone]) on 03/07/2021 8:09:46 PM  Radiology DG Chest 2 View  Result Date: 03/07/2021 CLINICAL DATA:  Shortness of breath and wheezing over the last week. EXAM: CHEST - 2 VIEW COMPARISON:  08/24/2017 FINDINGS: The cardiac silhouette is enlarged, consistent with cardiomegaly or pericardial fluid. There is pulmonary venous hypertension, possibly with mild interstitial edema. No visible effusion. No consolidation or  collapse. Question 14 mm nodule in the lateral right upper lobe. No acute bone finding. IMPRESSION: Newly seen enlargement of the cardiac silhouette with a pattern that could be due to cardiomegaly and/or pericardial fluid. Pulmonary venous hypertension, possibly with early interstitial edema. Question 14 mm lung nodule in the right upper lateral chest. Follow-up recommended either with  additional short-term radiography or chest CT. Electronically Signed   By: Nelson Chimes M.D.   On: 03/07/2021 17:40    Procedures Procedures  CRITICAL CARE Performed by: Charlesetta Shanks   Total critical care time: 45 minutes  Critical care time was exclusive of separately billable procedures and treating other patients.  Critical care was necessary to treat or prevent imminent or life-threatening deterioration.  Critical care was time spent personally by me on the following activities: development of treatment plan with patient and/or surrogate as well as nursing, discussions with consultants, evaluation of patient's response to treatment, examination of patient, obtaining history from patient or surrogate, ordering and performing treatments and interventions, ordering and review of laboratory studies, ordering and review of radiographic studies, pulse oximetry and re-evaluation of patient's condition.  Medications Ordered in ED Medications  nitroGLYCERIN (NITROGLYN) 2 % ointment 1 inch (1 inch Topical Given 03/07/21 2049)  hydrALAZINE (APRESOLINE) injection 20 mg (has no administration in time range)  furosemide (LASIX) injection 40 mg (40 mg Intravenous Given 03/07/21 2039)  aspirin chewable tablet 324 mg (324 mg Oral Given 03/07/21 2045)  hydrALAZINE (APRESOLINE) injection 20 mg (20 mg Intravenous Given 03/07/21 2042)    ED Course  I have reviewed the triage vital signs and the nursing notes.  Pertinent labs & imaging results that were available during my care of the patient were reviewed by me and considered in my medical decision making (see chart for details).  Clinical Course as of 03/15/21 0725  Fri Mar 07, 2021  2133 Blood pressure rechecked by myself at bedside.  187/85.  Patient reports this is about a good baseline for her typically.  I was planning on starting nitroglycerin drip for blood pressure still documenting in systolic of 673A and greater.  However at this time will opt  for an additional 20 mg dose of hydralazine.  Patient has diuresed well.  She has no chest pain.  No respiratory distress at rest.  We will continue with as needed blood pressure control and diuresis with plan for admission. [MP]  2155 Consult: Dr. Nevada Crane [facility] hospitalist accepts for admission. [MP]    Clinical Course User Index [MP] Charlesetta Shanks, MD   MDM Rules/Calculators/A&P                          Patient presents with findings consistent with hypertensive emergency.  She is volume overloaded with acute on chronic congestive heart failure.  Blood pressure management and diuresis initiated.  Final Clinical Impression(s) / ED Diagnoses Final diagnoses:  Hypertensive emergency  Acute on chronic congestive heart failure, unspecified heart failure type [facility])    Rx / DC Orders ED Discharge Orders     None        Charlesetta Shanks, MD 03/15/21 [phone]

## 2021-03-07 NOTE — Progress Notes (Signed)
Lori Mclaughlin is a 60 y.o. female with the following history as recorded in EpicCare:  Patient Active Problem List   Diagnosis Date Noted   Thiamine deficiency 09/19/2017   Myalgia 03/10/2017   Screen for colon cancer 06/12/2015   Visit for screening mammogram 06/12/2015   Hypersomnia 04/11/2014   Morbid obesity ([place]) 04/11/2014   Bradycardia, drug induced 04/11/2014   Chronic diastolic CHF (congestive heart failure), NYHA class 2 ([place]) 03/30/2014   Low back pain radiating to left leg 01/26/2012   Sleep apnea 11/13/2011   Hyperlipidemia with target LDL less than 70 01/14/2011   Type II diabetes mellitus with renal manifestations, uncontrolled ([place]) 08/05/2009   Deficiency anemia 08/05/2009   Essential hypertension, malignant 08/05/2009   ALLERGIC RHINITIS 08/05/2009   ASTHMA 08/05/2009   GERD 08/05/2009   OSTEOARTHRITIS 08/05/2009    Current Outpatient Medications  Medication Sig Dispense Refill   allopurinol (ZYLOPRIM) 300 MG tablet TAKE 1 TABLET BY MOUTH  DAILY 90 tablet 3   amLODipine (NORVASC) 10 MG tablet Take 1 tablet (10 mg total) by mouth daily. 90 tablet 1   aspirin EC 81 MG EC tablet Take 1 tablet (81 mg total) by mouth daily.     Blood Glucose Monitoring Suppl (ACCU-CHEK GUIDE) w/Device KIT 1 each by Does not apply route 2 (two) times daily. Use to monitor blood glucose level twice per day 1 kit 0   carvedilol (COREG) 25 MG tablet TAKE 1 TABLET BY MOUTH  TWICE DAILY WITH MEALS 180 tablet 3   chlorthalidone (HYGROTON) 50 MG tablet TAKE 1 TABLET BY MOUTH  DAILY 90 tablet 3   Continuous Blood Gluc Receiver (FREESTYLE LIBRE 2 READER) DEVI USE ONCE AS DIRECTED 1 each 0   Continuous Blood Gluc Sensor (FREESTYLE LIBRE 2 SENSOR) MISC 1 Device by Does not apply route every 14 (fourteen) days. 6 each 3   doxazosin (CARDURA) 1 MG tablet Take 1 tablet (1 mg total) by mouth at bedtime. 90 tablet 3   escitalopram (LEXAPRO) 10 MG tablet Take 1 tablet (10 mg total) by mouth  daily. 90 tablet 0   glucose blood test strip Use to monitor blood glucose level twice per day 100 each 12   hydrALAZINE (APRESOLINE) 50 MG tablet Take 1 tablet (50 mg total) by mouth 3 (three) times daily. 270 tablet 3   HYDROcodone-acetaminophen (NORCO) 5-325 MG tablet Take 1 tablet by mouth every 6 (six) hours as needed for moderate pain. 20 tablet 0   KOMBIGLYZE XR 2.11-998 MG TB24 TAKE 2 TABLETS BY MOUTH IN  THE MORNING 180 tablet 0   Lancets (ACCU-CHEK SOFT TOUCH) lancets Use to monitor blood glucose level twice per day 100 each 12   TOUJEO MAX SOLOSTAR 300 UNIT/ML Solostar Pen INJECT 70 UNITS INTO THE  SKIN IN THE MORNING 24 mL 3   No current facility-administered medications for this visit.    Allergies: Latex and Lisinopril  Past Medical History:  Diagnosis Date   Allergic rhinitis    Asthma    Atypical chest pain    a. 03/2014: normal nuclear stress test.   Chronic diastolic CHF (congestive heart failure) ([place])    a. Dx 03/2014 with echo - moderate LVH, EF 81-77%, grade 1 diastolic dysfunction, mildly dilated LA.   CKD (chronic kidney disease), stage II    Diabetes mellitus type II    GERD (gastroesophageal reflux disease)    Headache(784.0)    when my bp is up   Hypertension  Hypertensive heart disease    Microcytic anemia    OSA (obstructive sleep apnea)    severe with AHI 77/hr with nocturnal hypoxemia   Osteoarthritis     Past Surgical History:  Procedure Laterality Date   SHOULDER ARTHROSCOPY Bilateral    TUBAL LIGATION      Family History  Problem Relation Age of Onset   Stroke Mother    Cancer Father    Hypertension Brother    Diabetes Other    Hypertension Other    Stroke Other    Cancer Other        colon; prostate   Kidney disease Neg Hx    Alcohol abuse Neg Hx    Asthma Neg Hx    COPD Neg Hx    Depression Neg Hx    Drug abuse Neg Hx    Early death Neg Hx    Hearing loss Neg Hx    Heart disease Neg Hx    Hyperlipidemia Neg Hx     Social  History   Tobacco Use   Smoking status: Former    Types: Cigarettes    Quit date: 08/29/2010    Years since quitting: 10.5   Smokeless tobacco: Never  Substance Use Topics   Alcohol use: No    Alcohol/week: 5.0 standard drinks    Types: 5 Glasses of wine per week    Subjective:  Brought to the office by wheelchair today; complaining of worsening shortness of breath for the past 5 days; notes that symptoms started on Monday and have progressively been worsening; complaining of swelling in her ankles as well; previous history of CHF; no known history of PE;     Objective:  Vitals:   03/07/21 1620  BP: (!) 162/90  Pulse: 87  Temp: 97.6 F (36.4 C)  TempSrc: Oral  SpO2: 99%  Weight: 285 lb (129.3 kg)  Height: 5' (1.524 m)    General: Well developed, well nourished, in no acute distress; ill appearing Skin : Warm and dry.  Head: Normocephalic and atraumatic  Eyes: Sclera and conjunctiva clear; pupils round and reactive to light; extraocular movements intact  Ears: External normal; canals clear; tympanic membranes normal  Oropharynx: Pink, supple. No suspicious lesions  Neck: Supple without thyromegaly, adenopathy  Lungs: Respirations unlabored; clear to auscultation bilaterally without wheeze, rales, rhonchi  CVS exam: normal rate and regular rhythm.  Extremities: bilaterl edema, cyanosis, clubbing  Vessels: Symmetric bilaterally  Neurologic: Alert and oriented; speech intact; face symmetrical; moves all extremities well; CNII-XII intact without focal deficit   Assessment:  1. Shortness of breath     Plan:  Patient is seen at 4:15 on Friday afternoon- due to appearance and concern for CHF/ pneumonia/ PE, she is referred to ER for further evaluation; follow up to be determined.  This visit occurred during the SARS-CoV-2 public health emergency.  Safety protocols were in place, including screening questions prior to the visit, additional usage of staff PPE, and extensive  cleaning of exam room while observing appropriate contact time as indicated for disinfecting solutions.    No follow-ups on file.  No orders of the defined types were placed in this encounter.   Requested Prescriptions    No prescriptions requested or ordered in this encounter

## 2021-03-08 ENCOUNTER — Inpatient Hospital Stay (HOSPITAL_COMMUNITY): Payer: 59

## 2021-03-08 ENCOUNTER — Encounter (HOSPITAL_COMMUNITY): Payer: Self-pay | Admitting: Internal Medicine

## 2021-03-08 DIAGNOSIS — I5033 Acute on chronic diastolic (congestive) heart failure: Secondary | ICD-10-CM

## 2021-03-08 DIAGNOSIS — N1832 Chronic kidney disease, stage 3b: Secondary | ICD-10-CM

## 2021-03-08 DIAGNOSIS — E1129 Type 2 diabetes mellitus with other diabetic kidney complication: Secondary | ICD-10-CM

## 2021-03-08 DIAGNOSIS — I161 Hypertensive emergency: Secondary | ICD-10-CM

## 2021-03-08 DIAGNOSIS — E1165 Type 2 diabetes mellitus with hyperglycemia: Secondary | ICD-10-CM

## 2021-03-08 LAB — ECHOCARDIOGRAM COMPLETE
Area-P 1/2: 3.68 cm2
Height: 60 in
S' Lateral: 3.3 cm
Weight: 4525.6 oz

## 2021-03-08 LAB — GLUCOSE, CAPILLARY
Glucose-Capillary: 165 mg/dL — ABNORMAL HIGH (ref 70–99)
Glucose-Capillary: 154 mg/dL — ABNORMAL HIGH (ref 70–99)
Glucose-Capillary: 149 mg/dL — ABNORMAL HIGH (ref 70–99)
Glucose-Capillary: 88 mg/dL (ref 70–99)
Glucose-Capillary: 140 mg/dL — ABNORMAL HIGH (ref 70–99)

## 2021-03-08 LAB — BASIC METABOLIC PANEL
Anion gap: 8 (ref 5–15)
BUN: 22 mg/dL (ref 8–23)
CO2: 23 mmol/L (ref 22–32)
Calcium: 8.5 mg/dL — ABNORMAL LOW (ref 8.9–10.3)
Chloride: 106 mmol/L (ref 98–111)
Creatinine, Ser: 1.7 mg/dL — ABNORMAL HIGH (ref 0.44–1.00)
GFR, Estimated: 34 mL/min — ABNORMAL LOW (ref >60)
Glucose, Bld: 185 mg/dL — ABNORMAL HIGH (ref 70–99)
Potassium: 4.1 mmol/L (ref 3.5–5.1)
Sodium: 137 mmol/L (ref 135–145)

## 2021-03-08 LAB — LIPID PANEL
Cholesterol: 137 mg/dL (ref 0–200)
HDL: 48 mg/dL (ref >40)
LDL Cholesterol: 75 mg/dL (ref 0–99)
Total CHOL/HDL Ratio: 2.9 RATIO
Triglycerides: 68 mg/dL (ref <150)
VLDL: 14 mg/dL (ref 0–40)

## 2021-03-08 LAB — CBC
HCT: 26.2 % — ABNORMAL LOW (ref 36.0–46.0)
Hemoglobin: 8.2 g/dL — ABNORMAL LOW (ref 12.0–15.0)
MCH: 24 pg — ABNORMAL LOW (ref 26.0–34.0)
MCHC: 31.3 g/dL (ref 30.0–36.0)
MCV: 76.8 fL — ABNORMAL LOW (ref 80.0–100.0)
Platelets: 257 10*3/uL (ref 150–400)
RBC: 3.41 MIL/uL — ABNORMAL LOW (ref 3.87–5.11)
RDW: 17.4 % — ABNORMAL HIGH (ref 11.5–15.5)
WBC: 8.3 10*3/uL (ref 4.0–10.5)
nRBC: 0 % (ref 0.0–0.2)

## 2021-03-08 LAB — TROPONIN I (HIGH SENSITIVITY)
Troponin I (High Sensitivity): 25 ng/L — ABNORMAL HIGH (ref <18)
Troponin I (High Sensitivity): 28 ng/L — ABNORMAL HIGH (ref <18)

## 2021-03-08 LAB — HEMOGLOBIN A1C
Hgb A1c MFr Bld: 9.1 % — ABNORMAL HIGH (ref 4.8–5.6)
Mean Plasma Glucose: 214.47 mg/dL

## 2021-03-08 LAB — HIV ANTIBODY (ROUTINE TESTING W REFLEX): HIV Screen 4th Generation wRfx: NONREACTIVE

## 2021-03-08 LAB — TSH: TSH: 2.514 u[IU]/mL (ref 0.350–4.500)

## 2021-03-08 MED ORDER — ENOXAPARIN SODIUM 60 MG/0.6ML IJ SOSY
60.0000 mg | PREFILLED_SYRINGE | Freq: Every day | INTRAMUSCULAR | Status: DC
  Administered 2021-03-09 – 2021-03-10 (×2): 60 mg via SUBCUTANEOUS
  Filled 2021-03-08 (×2): qty 0.6

## 2021-03-08 MED ORDER — INSULIN ASPART 100 UNIT/ML IJ SOLN
0.0000 [IU] | Freq: Every day | INTRAMUSCULAR | Status: DC
  Administered 2021-03-12: 3 [IU] via SUBCUTANEOUS

## 2021-03-08 MED ORDER — SODIUM CHLORIDE 0.9 % IV SOLN: 250.0000 mL | INTRAVENOUS | Status: DC | PRN

## 2021-03-08 MED ORDER — ACETAMINOPHEN 650 MG RE SUPP: 650.0000 mg | Freq: Four times a day (QID) | RECTAL | Status: DC | PRN

## 2021-03-08 MED ORDER — ALLOPURINOL 300 MG PO TABS
300.0000 mg | ORAL_TABLET | Freq: Every day | ORAL | Status: DC
  Administered 2021-03-08 – 2021-03-12 (×5): 300 mg via ORAL
  Filled 2021-03-08 (×5): qty 1

## 2021-03-08 MED ORDER — ENOXAPARIN SODIUM 40 MG/0.4ML IJ SOSY
40.0000 mg | PREFILLED_SYRINGE | Freq: Every day | INTRAMUSCULAR | Status: DC
  Administered 2021-03-08: 40 mg via SUBCUTANEOUS
  Filled 2021-03-08: qty 0.4

## 2021-03-08 MED ORDER — INSULIN GLARGINE-YFGN 100 UNIT/ML ~~LOC~~ SOLN
70.0000 [IU] | Freq: Every day | SUBCUTANEOUS | Status: DC
  Administered 2021-03-08: 70 [IU] via SUBCUTANEOUS
  Filled 2021-03-08: qty 0.7

## 2021-03-08 MED ORDER — CARVEDILOL 25 MG PO TABS
25.0000 mg | ORAL_TABLET | Freq: Two times a day (BID) | ORAL | Status: DC
  Administered 2021-03-08 – 2021-03-09 (×3): 25 mg via ORAL
  Filled 2021-03-08 (×4): qty 1

## 2021-03-08 MED ORDER — SODIUM CHLORIDE 0.9% FLUSH
3.0000 mL | INTRAVENOUS | Status: DC | PRN
  Administered 2021-03-11 – 2021-03-13 (×2): 3 mL via INTRAVENOUS

## 2021-03-08 MED ORDER — FUROSEMIDE 10 MG/ML IJ SOLN
60.0000 mg | Freq: Three times a day (TID) | INTRAMUSCULAR | Status: DC
  Administered 2021-03-08 – 2021-03-09 (×3): 60 mg via INTRAVENOUS
  Filled 2021-03-08 (×3): qty 6

## 2021-03-08 MED ORDER — AMLODIPINE BESYLATE 10 MG PO TABS
10.0000 mg | ORAL_TABLET | Freq: Every day | ORAL | Status: DC
  Administered 2021-03-08 – 2021-03-14 (×7): 10 mg via ORAL
  Filled 2021-03-08 (×7): qty 1

## 2021-03-08 MED ORDER — FUROSEMIDE 10 MG/ML IJ SOLN
40.0000 mg | Freq: Two times a day (BID) | INTRAMUSCULAR | Status: DC
  Administered 2021-03-08: 40 mg via INTRAVENOUS
  Filled 2021-03-08: qty 4

## 2021-03-08 MED ORDER — EMPAGLIFLOZIN 10 MG PO TABS
10.0000 mg | ORAL_TABLET | Freq: Every day | ORAL | Status: DC
  Administered 2021-03-08: 10 mg via ORAL
  Filled 2021-03-08: qty 1

## 2021-03-08 MED ORDER — ESCITALOPRAM OXALATE 10 MG PO TABS
10.0000 mg | ORAL_TABLET | Freq: Every day | ORAL | Status: DC
  Administered 2021-03-08 – 2021-03-14 (×7): 10 mg via ORAL
  Filled 2021-03-08 (×6): qty 1

## 2021-03-08 MED ORDER — ASPIRIN EC 81 MG PO TBEC
81.0000 mg | DELAYED_RELEASE_TABLET | Freq: Every day | ORAL | Status: DC
  Administered 2021-03-08 – 2021-03-14 (×7): 81 mg via ORAL
  Filled 2021-03-08 (×7): qty 1

## 2021-03-08 MED ORDER — ISOSORB DINITRATE-HYDRALAZINE 20-37.5 MG PO TABS
1.0000 | ORAL_TABLET | Freq: Three times a day (TID) | ORAL | Status: DC
  Administered 2021-03-08 (×2): 1 via ORAL
  Filled 2021-03-08 (×2): qty 1

## 2021-03-08 MED ORDER — ASPIRIN EC 81 MG PO TBEC: 81.0000 mg | DELAYED_RELEASE_TABLET | Freq: Every day | ORAL | Status: DC

## 2021-03-08 MED ORDER — SODIUM CHLORIDE 0.9% FLUSH
3.0000 mL | Freq: Two times a day (BID) | INTRAVENOUS | Status: DC
  Administered 2021-03-08 – 2021-03-14 (×12): 3 mL via INTRAVENOUS

## 2021-03-08 MED ORDER — METFORMIN HCL ER 750 MG PO TB24
1500.0000 mg | ORAL_TABLET | Freq: Every day | ORAL | Status: DC
  Administered 2021-03-08: 1500 mg via ORAL
  Filled 2021-03-08 (×2): qty 2

## 2021-03-08 MED ORDER — DOXAZOSIN MESYLATE 1 MG PO TABS
1.0000 mg | ORAL_TABLET | Freq: Every day | ORAL | Status: DC
  Administered 2021-03-08 – 2021-03-13 (×7): 1 mg via ORAL
  Filled 2021-03-08 (×8): qty 1

## 2021-03-08 MED ORDER — INSULIN GLARGINE-YFGN 100 UNIT/ML ~~LOC~~ SOLN
35.0000 [IU] | Freq: Every day | SUBCUTANEOUS | Status: DC
  Administered 2021-03-09 – 2021-03-12 (×4): 35 [IU] via SUBCUTANEOUS
  Filled 2021-03-08 (×5): qty 0.35

## 2021-03-08 MED ORDER — INSULIN ASPART 100 UNIT/ML IJ SOLN
0.0000 [IU] | Freq: Three times a day (TID) | INTRAMUSCULAR | Status: DC
  Administered 2021-03-08: 2 [IU] via SUBCUTANEOUS
  Administered 2021-03-08: 3 [IU] via SUBCUTANEOUS
  Administered 2021-03-09 – 2021-03-10 (×4): 2 [IU] via SUBCUTANEOUS
  Administered 2021-03-11 – 2021-03-12 (×3): 3 [IU] via SUBCUTANEOUS
  Administered 2021-03-12: 2 [IU] via SUBCUTANEOUS
  Administered 2021-03-13: 5 [IU] via SUBCUTANEOUS
  Administered 2021-03-13 – 2021-03-14 (×3): 3 [IU] via SUBCUTANEOUS

## 2021-03-08 MED ORDER — ACETAMINOPHEN 325 MG PO TABS
650.0000 mg | ORAL_TABLET | Freq: Four times a day (QID) | ORAL | Status: DC | PRN
  Administered 2021-03-08: 650 mg via ORAL
  Filled 2021-03-08: qty 2

## 2021-03-08 MED ORDER — IRBESARTAN 300 MG PO TABS
150.0000 mg | ORAL_TABLET | Freq: Every day | ORAL | Status: DC
  Administered 2021-03-08 – 2021-03-12 (×5): 150 mg via ORAL
  Filled 2021-03-08 (×6): qty 1

## 2021-03-08 MED ORDER — HYDRALAZINE HCL 50 MG PO TABS
50.0000 mg | ORAL_TABLET | Freq: Three times a day (TID) | ORAL | Status: DC
  Administered 2021-03-08 – 2021-03-13 (×15): 50 mg via ORAL
  Filled 2021-03-08 (×16): qty 1

## 2021-03-08 MED ORDER — ATORVASTATIN CALCIUM 10 MG PO TABS
20.0000 mg | ORAL_TABLET | Freq: Every day | ORAL | Status: DC
  Administered 2021-03-08 – 2021-03-14 (×7): 20 mg via ORAL
  Filled 2021-03-08 (×7): qty 2

## 2021-03-08 MED ORDER — SENNOSIDES-DOCUSATE SODIUM 8.6-50 MG PO TABS
1.0000 | ORAL_TABLET | Freq: Every evening | ORAL | Status: DC | PRN
  Administered 2021-03-13: 1 via ORAL
  Filled 2021-03-08: qty 1

## 2021-03-08 NOTE — H&P (Addendum)
History and Physical    Lori Mclaughlin TGG:[id number] DOB: [birth year] DOA: 03/07/2021  PCP: Marrian Salvage, FNP   Patient coming from: Home via [facility] Er  Chief Complaint: SOB  HPI: Lori Mclaughlin is a 60 y.o. female with medical history significant for HFpEF, HTN, CKD 3B, DMT2 who presented with complaint of SOB and was transferred to [facility] for exacerbation of HFpEF.  She reports that for the last 2 weeks she has had worsening shortness of breath.  Over the last 2 days shortness of breath is gotten so bad she cannot walk across the room or down to to 3 steps without becoming severely short of breath and having to stop and rest.  She also reports that she had orthopnea last night and was unable to lay flat and had to sleep sitting up which she normally does not do.  She states she has had a 15 pound weight gain in the last 2 weeks.  She has had similar symptoms in the past but is been many years she states.  She has not had an echocardiogram in the last 6 to 7 years.  States she has had some left substernal chest discomfort but no pain.  States the discomfort just feels like a intermittent pressure/squeezing that does not radiate.  She was found emergency room to have a significant elevated blood pressure and an elevated BNP level.  She was given dose of hydralazine and a nitroglycerin patch in the emergency room.  She was also given a dose of Lasix.  She reports that her blood pressures been high for a while and has been difficult to control and she is being referred to a hypertension specialist by her PCP she states.  She states her diabetes has been very high in the past and her last hemoglobin A1c level was in May of this year and was 15.8.  She reports that since then she has been taking her medications and watching her diet more closely than she used to.  She has not had any fevers or chills.  She has a history of DVT or PEs.  She denies any recent travel or  prolonged immobilization. She denies tobacco, alcohol, illicit drug use.  ED Course: In the emergency room her blood pressure initially was 208/104.  She was maintaining oxygen saturation at rest of 96-99% on room air.  BNP level was elevated at 615.  Troponin in the emergency room was 15 and then 18.  She was noted to have stable chronic kidney disease.  WBC was 7600, hemoglobin 9.3, medic at 30.2, platelets 278,000, sodium 138, potassium 4.3 chloride 102 bicarb 26 glucose 243 creatinine 1.79 BUN 23 magnesium 1.6 alkaline phosphatase 77 AST 24 ALT 19.  X-ray revealed cardiomegaly with mild interstitial edema.  Patient was given Lasix which did help her breathing and she has now started to diurese.  COVID-19 swab was negative.  Influenza A and B are negative.  Review of Systems:  General: Denies fever, chills, weight loss, night sweats.  Denies dizziness.  Denies change in appetite HENT: Denies head trauma, headache, denies change in hearing, tinnitus. Denies nasal congestion. Denies sore throat. Denies difficulty swallowing Eyes: Denies blurry vision, pain in eye, drainage.  Denies discoloration of eyes. Neck: Denies pain.  Denies swelling.  Denies pain with movement. Cardiovascular: Reports intermittent chest discomfort. Denies palpitations. Reports edema. Reports orthopnea Respiratory: Reports shortness of breath. Denies cough.  Denies wheezing.  Denies sputum production Gastrointestinal: Denies abdominal pain,  swelling.  Denies nausea, vomiting, diarrhea.  Denies melena.  Denies hematemesis. Musculoskeletal: Denies limitation of movement.  Denies deformity or swelling.  Denies pain.  Denies arthralgias or myalgias. Genitourinary: Denies pelvic pain.  Denies urinary frequency or hesitancy.  Denies dysuria.  Skin: Denies rash.  Denies petechiae, purpura, ecchymosis. Neurological: Denies syncope. Denies seizure activity. Denies paresthesia. Denies slurred speech, drooping face.  Denies visual  change. Psychiatric: Denies depression, anxiety.  Denies hallucinations.  Past Medical History:  Diagnosis Date   Allergic rhinitis    Asthma    Atypical chest pain    a. 03/2014: normal nuclear stress test.   Chronic diastolic CHF (congestive heart failure) ([place])    a. Dx 03/2014 with echo - moderate LVH, EF 81-01%, grade 1 diastolic dysfunction, mildly dilated LA.   CKD (chronic kidney disease), stage II    Diabetes mellitus type II    GERD (gastroesophageal reflux disease)    Headache(784.0)    when my bp is up   Hypertension    Hypertensive heart disease    Microcytic anemia    OSA (obstructive sleep apnea)    severe with AHI 77/hr with nocturnal hypoxemia   Osteoarthritis     Past Surgical History:  Procedure Laterality Date   SHOULDER ARTHROSCOPY Bilateral    TUBAL LIGATION      Social History  reports that she quit smoking about 10 years ago. Her smoking use included cigarettes. She has never used smokeless tobacco. She reports that she does not drink alcohol and does not use drugs.  Allergies  Allergen Reactions   Latex     REACTION: itching,rash   Lisinopril     REACTION: headache    Family History  Problem Relation Age of Onset   Stroke Mother    Cancer Father    Hypertension Brother    Diabetes Other    Hypertension Other    Stroke Other    Cancer Other        colon; prostate   Kidney disease Neg Hx    Alcohol abuse Neg Hx    Asthma Neg Hx    COPD Neg Hx    Depression Neg Hx    Drug abuse Neg Hx    Early death Neg Hx    Hearing loss Neg Hx    Heart disease Neg Hx    Hyperlipidemia Neg Hx      Prior to Admission medications   Medication Sig Start Date End Date Taking? Authorizing Provider  allopurinol (ZYLOPRIM) 300 MG tablet TAKE 1 TABLET BY MOUTH  DAILY 01/30/20   Marrian Salvage, FNP  amLODipine (NORVASC) 10 MG tablet Take 1 tablet (10 mg total) by mouth daily. 02/07/20   Marrian Salvage, FNP  aspirin EC 81 MG EC tablet Take 1  tablet (81 mg total) by mouth daily. 04/01/14   Dunn, Nedra Hai, PA-C  Blood Glucose Monitoring Suppl (ACCU-CHEK GUIDE) w/Device KIT 1 each by Does not apply route 2 (two) times daily. Use to monitor blood glucose level twice per day 06/06/18   Renato Shin, MD  carvedilol (COREG) 25 MG tablet TAKE 1 TABLET BY MOUTH  TWICE DAILY WITH MEALS 02/27/21   Sueanne Margarita, MD  chlorthalidone (HYGROTON) 50 MG tablet TAKE 1 TABLET BY MOUTH  DAILY 02/27/21   Sueanne Margarita, MD  Continuous Blood Gluc Receiver (FREESTYLE LIBRE 2 READER) DEVI USE ONCE AS DIRECTED 01/28/21   Renato Shin, MD  Continuous Blood Gluc Sensor (FREESTYLE LIBRE 2  SENSOR) MISC 1 Device by Does not apply route every 14 (fourteen) days. 12/09/20   Renato Shin, MD  doxazosin (CARDURA) 1 MG tablet Take 1 tablet (1 mg total) by mouth at bedtime. 02/28/21   Sueanne Margarita, MD  escitalopram (LEXAPRO) 10 MG tablet Take 1 tablet (10 mg total) by mouth daily. 12/11/20   Marrian Salvage, FNP  glucose blood test strip Use to monitor blood glucose level twice per day 06/06/18   Renato Shin, MD  hydrALAZINE (APRESOLINE) 50 MG tablet Take 1 tablet (50 mg total) by mouth 3 (three) times daily. 02/28/21   Sueanne Margarita, MD  HYDROcodone-acetaminophen (NORCO) 5-325 MG tablet Take 1 tablet by mouth every 6 (six) hours as needed for moderate pain. 10/13/17   Marrian Salvage, FNP  KOMBIGLYZE XR 2.11-998 MG TB24 TAKE 2 TABLETS BY MOUTH IN  THE MORNING 07/11/20   Renato Shin, MD  Lancets [facility] SOFT [facility]) lancets Use to monitor blood glucose level twice per day 06/06/18   Renato Shin, MD  TOUJEO MAX SOLOSTAR 300 UNIT/ML Solostar Pen INJECT 70 UNITS INTO THE  SKIN IN THE MORNING 05/14/20   Renato Shin, MD    Physical Exam: Vitals:   03/07/21 2042 03/07/21 2100 03/07/21 2200 03/07/21 2347  BP: (!) 219/98 (!) 214/87 (!) 196/88 (!) 181/73  Pulse:  83 78 76  Resp:  20 (!) 26 20  Temp:    98.3 F (36.8 C)  TempSrc:    Oral  SpO2:  99% 99%  96%    Constitutional: NAD, calm, comfortable Vitals:   03/07/21 2042 03/07/21 2100 03/07/21 2200 03/07/21 2347  BP: (!) 219/98 (!) 214/87 (!) 196/88 (!) 181/73  Pulse:  83 78 76  Resp:  20 (!) 26 20  Temp:    98.3 F (36.8 C)  TempSrc:    Oral  SpO2:  99% 99% 96%   General: WDWN, Alert and oriented x3.  Eyes: EOMI, PERRL, conjunctivae normal.  Sclera nonicteric HENT:  [place]/AT, external ears normal.  Nares patent without epistasis.  Mucous membranes are moist. Posterior pharynx clear. Normal dentition.  Neck: Soft, normal range of motion, supple, no masses, no thyromegaly. Trachea midline Respiratory: Diminished breath sounds in bases with mild rales and crackles. no wheezing. Normal respiratory effort. No accessory muscle use.  Cardiovascular: Regular rate and rhythm, no murmurs / rubs / gallops. +1 lower extremity edema. 2+ pedal pulses.   Abdomen: Soft, no tenderness, nondistended, no rebound or guarding.  No masses palpated. Morbidly obese. Bowel sounds normoactive Musculoskeletal: FROM. no cyanosis. No joint deformity upper and lower extremities.  Normal muscle tone.  Skin: Warm, dry, intact no rashes, lesions, ulcers. No induration. Normal skin turgor. Neurologic: CN 2-12 grossly intact.  Normal speech.  Sensation intact to touch. Strength 5/5 in all extremities.   Psychiatric: Normal judgment and insight.  Normal mood.    Labs on Admission: I have personally reviewed following labs and imaging studies  CBC: Recent Labs  Lab 03/07/21 1738  WBC 7.6  HGB 9.3*  HCT 30.2*  MCV 77.8*  PLT 702    Basic Metabolic Panel: Recent Labs  Lab 03/07/21 1738  NA 138  K 4.3  CL 102  CO2 26  GLUCOSE 243*  BUN 23  CREATININE 1.79*  CALCIUM 8.8*  MG 1.6*    GFR: Estimated Creatinine Clearance: 41.2 mL/min (A) (by C-G formula based on SCr of 1.79 mg/dL (H)).  Liver Function Tests: Recent Labs  Lab 03/07/21 1738  AST 24  ALT 19  ALKPHOS 77  BILITOT 0.2*  PROT 7.7   ALBUMIN 3.6    Urine analysis:    Component Value Date/Time   COLORURINE YELLOW 03/10/2017 1135   APPEARANCEUR [place] (A) 03/10/2017 1135   LABSPEC 1.015 03/10/2017 1135   PHURINE 6.5 03/10/2017 1135   GLUCOSEU NEGATIVE 03/10/2017 1135   HGBUR NEGATIVE 03/10/2017 1135   BILIRUBINUR NEGATIVE 03/10/2017 1135   KETONESUR NEGATIVE 03/10/2017 1135   PROTEINUR NEGATIVE 06/12/2015 1729   UROBILINOGEN 0.2 03/10/2017 1135   NITRITE NEGATIVE 03/10/2017 1135   LEUKOCYTESUR SMALL (A) 03/10/2017 1135    Radiological Exams on Admission: DG Chest 2 View  Result Date: 03/07/2021 CLINICAL DATA:  Shortness of breath and wheezing over the last week. EXAM: CHEST - 2 VIEW COMPARISON:  08/24/2017 FINDINGS: The cardiac silhouette is enlarged, consistent with cardiomegaly or pericardial fluid. There is pulmonary venous hypertension, possibly with mild interstitial edema. No visible effusion. No consolidation or collapse. Question 14 mm nodule in the lateral right upper lobe. No acute bone finding. IMPRESSION: Newly seen enlargement of the cardiac silhouette with a pattern that could be due to cardiomegaly and/or pericardial fluid. Pulmonary venous hypertension, possibly with early interstitial edema. Question 14 mm lung nodule in the right upper lateral chest. Follow-up recommended either with additional short-term radiography or chest CT. Electronically Signed   By: Nelson Chimes M.D.   On: 03/07/2021 17:40    EKG: Independently reviewed.  EKG is reviewed and shows normal sinus rhythm with no acute ST elevation or depression.  QTc is prolonged at 488  Assessment/Plan Principal Problem:   Acute on chronic diastolic CHF (congestive heart failure)  Ms. Roselie Awkward is admitted to cardiac telemetry floor.  Diuresis with lasix bid. Monitor Daily weights and I&Os. Obtain Echocardiogram in morning to evaluate wall motion, EF and valvular function.  Last echocardiogram was in September 2015 which showed an EF  of 55 to 60% with LVH and diastolic dysfunction. Check serial troponin levels overnight.  Initial levels were negative in the emergency room to make sure there is no ischemic etiology for exacerbation of CHF.  Congestive heart failure education will be provided to patient before discharge Continue beta-blocker therapy with Coreg. Bildil added to patient's regimen for CHF and HTN. ARB therapy with Avapro added to regimen. Started on Jardiance which has been shown in studies to reduce risk of CHF exacerbation and hospitalization.   Active Problems:   Hypertensive emergency Patient with significant elevated blood pressure when she arrived to the emergency room.  Blood pressure still elevated in the 180-190/75-90 range.  Pt given NTG patch in ER which is continued.  Continue norvasc, coreg, cardura.  Add Avapro and Bildil to regimen.  Monitor BP    Type II diabetes mellitus with renal manifestations, uncontrolled Patient has been on Toujeo for basal insulin which will be continued. She has been on Kombiglyze XR for her DM. This medication contains a DDP-4 inhibitor which are known to cause exacerbation of CHF. Stop Kombiglyze XR and start Jardiance and metformin.  Vania Rea is an SLGT2 inhibitor which has been shown in studies to help with diabetes control but also has been shown to decrease risk of hospitalization and exacerbations of CHF.  Patient may be monitored for the rare side effect of DKA with his family medication.  Gust pros and cons of this medication including increased risk of urinary tract and genital infections.  Hemoglobin A1c level Blood sugars were monitored with meals and at  bedtime.  Corrective insulin has been ordered to maintain glycemic control while in the hospital Check lipid panel. Start lipitor as statin therapy is shown to reduce CV risk in diabetic patients.    Chronic kidney disease (CKD) stage G3b/A1, moderately decreased glomerular filtration rate (GFR) between 30-44  mL/min Monitor renal function and electrolytes with labs in the morning. Patient been started on Avapro for blood pressure control and renal protection with diabetes.  Also started Jardiance which will help with her diabetes and renal function and will need to be monitored by PCP after discharge Will need monitoring of renal function with PCP after discharge     Prolonged QT interval Avoid medications which could further prolong QT interval. Monitor on telemetry.    Morbid obesity Follow-up with PCP for dietary, lifestyle, pharmacotherapy interventions for weight loss and/or possible bariatric surgery referral.     CXR reveals a 14 mm nodule in right upper lobe per radiology. Needs follow up with repeat CXR or CT chest to further evaluate.  I discussed this finding with Ms. Roselie Awkward and she would like to get the CT to help determine the etiology of the nodule on the CXR. She had an aunt recently pass away with sarcoidosis which is on the differential diagnosis.     DVT prophylaxis: Lovenox for DVT prophylaxis.   Code Status:   Full Code  Family Communication:  Diagnosis and plan discussed with patient and her daughters were at the bedside.  Questions were answered.  She verbalized understanding agrees with plan.  Further recommendations to follow as clinically indicated Disposition Plan:   Patient is from:  Home  Anticipated DC to:  Home  Anticipated DC date:  Anticipate 2 midnight or more stay in the hospital to treat acute condition  Anticipated DC barriers: No barriers to discharge identified at this time  Admission status:  Inpatient   Yevonne Aline Koda Defrank MD [facility]  How to contact the [facility] Attending or Consulting provider [place] or covering provider during after hours [place], for this patient?   Check the care team in [facility] and look for a) attending/consulting [facility] provider listed and b) the [facility] team listed Log into [email or web address] and use [place]'s universal  password to access. If you do not have the password, please contact the hospital operator. Locate the [facility] provider you are looking for under [facility] and page to a number that you can be directly reached. If you still have difficulty reaching the provider, please page the [facility] (Director on Call) for the Hospitalists listed on amion for assistance.  03/08/2021, 12:51 AM

## 2021-03-08 NOTE — Plan of Care (Signed)
°  Problem: Education: °Goal: Knowledge of General Education information will improve °Description: Including pain rating scale, medication(s)/side effects and non-pharmacologic comfort measures °Outcome: Progressing °  °Problem: Clinical Measurements: °Goal: Respiratory complications will improve °Outcome: Progressing °  °Problem: Elimination: °Goal: Will not experience complications related to urinary retention °Outcome: Progressing °  °

## 2021-03-08 NOTE — Progress Notes (Signed)
  Echocardiogram 2D Echocardiogram has been performed.  Delcie Roch 03/08/2021, 2:43 PM

## 2021-03-08 NOTE — Plan of Care (Signed)
?  Problem: Clinical Measurements: ?Goal: Respiratory complications will improve ?Outcome: Progressing ?  ?Problem: Elimination: ?Goal: Will not experience complications related to urinary retention ?Outcome: Progressing ?  ?

## 2021-03-08 NOTE — Progress Notes (Signed)
PROGRESS NOTE    Lori Mclaughlin  AQT:[id number] DOB: 08/19/1960 DOA: 03/07/2021 PCP: Olive Bass, FNP    Brief Narrative:  [name] was admitted to the hospital with the working diagnosis of acute decompensation of heart failure.   60 year old female past medical history for heart failure, hypertension, chronic kidney disease and type 2 diabetes mellitus who presented with dyspnea.  Reported 2 weeks of worsening dyspnea, more severe over the last 48 hours, to the point where she became symptomatic with minimal efforts.  Positive orthopnea and PND.  15 pound weight gain over the last 2 weeks.  On her initial physical examination her blood pressure was 208/104, heart rate 83, respiratory rate 26, temperature 98.3, oxygen saturation 96% on supplemental oxygen.  Her lungs had decreased breath sounds bilaterally with positive rales but no wheezing, heart S1-S2, present, rhythmic, soft abdomen, positive lower extremity edema.  Sodium 138, potassium 4.3, chloride 102, bicarb 26, glucose 243, BUN 23, creatinine 1.79, magnesium 1.6, BNP 631, high sensitive troponin 15-18-25-28, white count 7.6, hemoglobin 9.3, hematocrit 30.2, platelets 278. SARS COVID-19 negative.  Chest radiograph with positive cardiomegaly, positive hilar vascular congestion, right upper lobe 14 mm nodule, questionable.  EKG 79 bpm, left axis deviation, left anterior fascicular block, normal QTC, sinus rhythm, no significant ST segment or T wave changes.  Assessment & Plan:   Principal Problem:   Acute on chronic diastolic CHF (congestive heart failure) (HCC) Active Problems:   Type II diabetes mellitus with renal manifestations, uncontrolled (HCC)   Morbid obesity (HCC)   Hypertensive emergency   Chronic kidney disease (CKD) stage G3b/A1, moderately decreased glomerular filtration rate (GFR) between 30-44 mL/min/1.73 square meter and albuminuria creatinine ratio less than 30 mg/g (HCC)   Acute  on chronic diastolic heart failure exacerbation.  Patient with improvement of dyspnea but continue to have significant lower extremity edema. Documented urine output 250 ml. Systolic blood pressure 162 mmHg.  Plan to continue diuresis with furosemide, will increase the dose to 60 mg IV q8 hrs. Continue carvedilol, hydralazine and ibersartan.  Blood pressure control with amlodipine.   Follow on echocardiogram if reduced ejection fraction will consider Bidil.   2. CKD stage 3b. Renal function with serum cr at 1.70 with K at 4,1 and serum bicarbonate at 23. Continue diuresis with furosemide and follow up renal function in am, avoid hypotension and nephrotoxic medications.   Anemia of chronic renal disease, Hgb at 8,2 and Hct a 26.2   3. T2DM. Continue glucose cover and monitoring with insulin sliding scale.  Patient is tolerating po well.  Hold on metformin for now.  Capillary glucose 165, 154, 149, will reduced dose of basal insulin in 50% to prevent hypoglycemia, from 70 to 35 units.   4. Obesity class 3. Calculated BMI is 55,2, will need outpatient follow up.   5. Right upper lobe nodule. Will repeat chest film after resolution of pulmonary edema, will hold on chest CT for now.   Patient continue to be at high risk for worsening heart failure   Status is: Inpatient  Remains inpatient appropriate because:Inpatient level of care appropriate due to severity of illness  Dispo: The patient is from: Home              Anticipated d/c is to: Home              Patient currently is not medically stable to d/c.   Difficult to place patient No   DVT prophylaxis: Enoxaparin  Code Status:    full  Family Communication:  I spoke with patient's daughter at the bedside, we talked in detail about patient's condition, plan of care and prognosis and all questions were addressed.     Subjective: Patient is feeling better but not yet back to baseline, continue to have dyspnea and lower  extremity edema, no nausea or vomiting, no chest pain,   Objective: Vitals:   03/07/21 2200 03/07/21 2347 03/08/21 0300 03/08/21 0442  BP: (!) 196/88 (!) 181/73  (!) 162/69  Pulse: 78 76  (!) 56  Resp: (!) 26 20  18   Temp:  98.3 F (36.8 C)  98.4 F (36.9 C)  TempSrc:  Oral  Oral  SpO2: 99% 96%  95%  Weight:   128.3 kg   Height:   5' (1.524 m)     Intake/Output Summary (Last 24 hours) at 03/08/2021 1410 Last data filed at 03/08/2021 1353 Gross per 24 hour  Intake 480 ml  Output --  Net 480 ml   Filed Weights   03/08/21 0300  Weight: 128.3 kg    Examination:   General: Not in pain or dyspnea, deconditioned  Neurology: Awake and alert, non focal  E ENT: mild pallor, no icterus, oral mucosa moist Cardiovascular: No JVD. S1-S2 present, rhythmic, no gallops, rubs, or murmurs. ++ bilateral pitting lower extremity edema. Pulmonary: positive breath sounds bilaterally, with no wheezing, or rhonchi positive rales. Gastrointestinal. Abdomen soft and non tender Skin. No rashes Musculoskeletal: no joint deformities     Data Reviewed: I have personally reviewed following labs and imaging studies  CBC: Recent Labs  Lab 03/07/21 1738 03/08/21 0326  WBC 7.6 8.3  HGB 9.3* 8.2*  HCT 30.2* 26.2*  MCV 77.8* 76.8*  PLT 278 257   Basic Metabolic Panel: Recent Labs  Lab 03/07/21 1738 03/08/21 0326  NA 138 137  K 4.3 4.1  CL 102 106  CO2 26 23  GLUCOSE 243* 185*  BUN 23 22  CREATININE 1.79* 1.70*  CALCIUM 8.8* 8.5*  MG 1.6*  --    GFR: Estimated Creatinine Clearance: 43.1 mL/min (A) (by C-G formula based on SCr of 1.7 mg/dL (H)). Liver Function Tests: Recent Labs  Lab 03/07/21 1738  AST 24  ALT 19  ALKPHOS 77  BILITOT 0.2*  PROT 7.7  ALBUMIN 3.6   No results for input(s): LIPASE, AMYLASE in the last 168 hours. No results for input(s): AMMONIA in the last 168 hours. Coagulation Profile: No results for input(s): INR, PROTIME in the last 168 hours. Cardiac  Enzymes: No results for input(s): CKTOTAL, CKMB, CKMBINDEX, TROPONINI in the last 168 hours. BNP (last 3 results) No results for input(s): PROBNP in the last 8760 hours. HbA1C: Recent Labs    03/08/21 0326  HGBA1C 9.1*   CBG: Recent Labs  Lab 03/08/21 0129 03/08/21 0557 03/08/21 1049  GLUCAP 165* 154* 149*   Lipid Profile: Recent Labs    03/08/21 0326  CHOL 137  HDL 48  LDLCALC 75  TRIG 68  CHOLHDL 2.9   Thyroid Function Tests: Recent Labs    03/08/21 0326  TSH 2.514   Anemia Panel: No results for input(s): VITAMINB12, FOLATE, FERRITIN, TIBC, IRON, RETICCTPCT in the last 72 hours.    Radiology Studies: I have reviewed all of the imaging during this hospital visit personally     Scheduled Meds:  amLODipine  10 mg Oral Daily   aspirin EC  81 mg Oral Daily   atorvastatin  20 mg  Oral Daily   carvedilol  25 mg Oral BID WC   doxazosin  1 mg Oral QHS   empagliflozin  10 mg Oral Daily   [START ON 03/09/2021] enoxaparin (LOVENOX) injection  60 mg Subcutaneous Daily   furosemide  40 mg Intravenous BID   insulin aspart  0-15 Units Subcutaneous TID WC   insulin aspart  0-5 Units Subcutaneous QHS   insulin glargine-yfgn  70 Units Subcutaneous Q0600   irbesartan  150 mg Oral Daily   isosorbide-hydrALAZINE  1 tablet Oral TID   metFORMIN  1,500 mg Oral Q breakfast   nitroGLYCERIN  1 inch Topical Q6H   sodium chloride flush  3 mL Intravenous Q12H   Continuous Infusions:  sodium chloride       LOS: 1 day        Zaki Gertsch Annett Gula, MD

## 2021-03-09 LAB — BASIC METABOLIC PANEL
Anion gap: 10 (ref 5–15)
BUN: 25 mg/dL — ABNORMAL HIGH (ref 8–23)
CO2: 27 mmol/L (ref 22–32)
Calcium: 8.9 mg/dL (ref 8.9–10.3)
Chloride: 103 mmol/L (ref 98–111)
Creatinine, Ser: 2.25 mg/dL — ABNORMAL HIGH (ref 0.44–1.00)
GFR, Estimated: 24 mL/min — ABNORMAL LOW (ref >60)
Glucose, Bld: 105 mg/dL — ABNORMAL HIGH (ref 70–99)
Potassium: 3.6 mmol/L (ref 3.5–5.1)
Sodium: 140 mmol/L (ref 135–145)

## 2021-03-09 LAB — GLUCOSE, CAPILLARY
Glucose-Capillary: 98 mg/dL (ref 70–99)
Glucose-Capillary: 138 mg/dL — ABNORMAL HIGH (ref 70–99)
Glucose-Capillary: 142 mg/dL — ABNORMAL HIGH (ref 70–99)
Glucose-Capillary: 146 mg/dL — ABNORMAL HIGH (ref 70–99)

## 2021-03-09 MED ORDER — CARVEDILOL 12.5 MG PO TABS
12.5000 mg | ORAL_TABLET | Freq: Two times a day (BID) | ORAL | Status: DC
  Administered 2021-03-09 – 2021-03-13 (×7): 12.5 mg via ORAL
  Filled 2021-03-09 (×8): qty 1

## 2021-03-09 MED ORDER — FUROSEMIDE 10 MG/ML IJ SOLN
60.0000 mg | Freq: Two times a day (BID) | INTRAMUSCULAR | Status: DC
  Administered 2021-03-09 – 2021-03-10 (×2): 60 mg via INTRAVENOUS
  Filled 2021-03-09 (×2): qty 6

## 2021-03-09 NOTE — Plan of Care (Signed)

## 2021-03-09 NOTE — Progress Notes (Signed)
Set up CPAP via nasal mask, started with pressure of 10.0 cm H20 as patient does not know her settings and has not been fitted for mask. Patient states shes SOB and having difficulty breathing. Titrated pressure to 7.0 cm H20. Patient unable to tolerate. RN aware. RT will continue to monitor as needed.

## 2021-03-09 NOTE — Progress Notes (Signed)
PROGRESS NOTE    Lori Mclaughlin  DXI:[id number] DOB: 1961/06/23 DOA: 03/07/2021 PCP: Olive Bass, FNP    Brief Narrative:  [name] was admitted to the hospital with the working diagnosis of acute decompensation of heart failure.    60 year old female past medical history for heart failure, hypertension, chronic kidney disease and type 2 diabetes mellitus who presented with dyspnea.  Reported 2 weeks of worsening dyspnea, more severe over the last 48 hours, to the point where she became symptomatic with minimal efforts.  Positive orthopnea and PND.  15 pound weight gain over the last 2 weeks.  On her initial physical examination her blood pressure was 208/104, heart rate 83, respiratory rate 26, temperature 98.3, oxygen saturation 96% on supplemental oxygen.  Her lungs had decreased breath sounds bilaterally with positive rales but no wheezing, heart S1-S2, present, rhythmic, soft abdomen, positive lower extremity edema.   Sodium 138, potassium 4.3, chloride 102, bicarb 26, glucose 243, BUN 23, creatinine 1.79, magnesium 1.6, BNP 631, high sensitive troponin 15-18-25-28, white count 7.6, hemoglobin 9.3, hematocrit 30.2, platelets 278. SARS COVID-19 negative.   Chest radiograph with positive cardiomegaly, positive hilar vascular congestion, right upper lobe 14 mm nodule, questionable.   EKG 79 bpm, left axis deviation, left anterior fascicular block, normal QTC, sinus rhythm, no significant ST segment or T wave changes.   Placed on IV furosemide for diuresis with good toleration.   Assessment & Plan:   Principal Problem:   Acute on chronic diastolic CHF (congestive heart failure) (HCC) Active Problems:   Type II diabetes mellitus with renal manifestations, uncontrolled (HCC)   Morbid obesity (HCC)   Hypertensive emergency   Chronic kidney disease (CKD) stage G3b/A1, moderately decreased glomerular filtration rate (GFR) between 30-44 mL/min/1.73 square meter  and albuminuria creatinine ratio less than 30 mg/g (HCC)   Acute on chronic diastolic heart failure exacerbation.  Urine output 1,850 ml, with improvement of symptoms but not yet back to baseline.   Continue diuresis with furosemide, 60 mg IV q12 hrs. Heart failure management with carvedilol, hydralazine and ibersartan.  Blood pressure control with amlodipine.  Blood pressure systolic 130 to 146 mmHg.    Echocardiogram with LV EF 60 to 65%, mild concentric LVH, Rv systolic function is preserved. No significant valvular disease.    2. CKD stage 3b.  Continue to have volume overload, serum cr today at 2,25 with K at 3,6 and bicarbonate at 27, Will continue furosemide today, bid furosemide and follow renal function in am,    Anemia of chronic renal disease.    3. T2DM/ dyslipidemia. On insulin sliding scale for glucose cover and monitoring.  Fasting glucose today 105, basal insulin 35 units.  Continue statin therapy with atorvastatin.    4. Obesity class 3/ OSA. BMI is 55,2,    5. Right upper lobe nodule. Follow chest film in am, after aggressive diuresis.   6. Gout. No active flare, will continue with allopurinol.   7. Depression. Continue with escitalopram.   Patient continue to be at high risk for worsening heart failure   Status is: Inpatient  Remains inpatient appropriate because:IV treatments appropriate due to intensity of illness or inability to take PO  Dispo: The patient is from: Home              Anticipated d/c is to: Home              Patient currently is not medically stable to d/c.   Difficult to place  patient No   DVT prophylaxis: Heparin   Code Status:    full  Family Communication:   No family at the bedside      Subjective: Patient feeling better but yet back to baseline, no nausea or vomiting, no chest pain  Objective: Vitals:   03/08/21 1746 03/08/21 1940 03/09/21 0001 03/09/21 0339  BP: (!) 146/58 (!) 132/55 (!) 146/71   Pulse: 63 (!) 57 61    Resp: 16 17 18    Temp: 98.4 F (36.9 C) 98.2 F (36.8 C) 98.4 F (36.9 C)   TempSrc: Oral Oral Oral   SpO2: 95% 97% 100%   Weight:    124.7 kg  Height:        Intake/Output Summary (Last 24 hours) at 03/09/2021 0952 Last data filed at 03/09/2021 0336 Gross per 24 hour  Intake 480 ml  Output 1850 ml  Net -1370 ml   Filed Weights   03/08/21 0300 03/09/21 0339  Weight: 128.3 kg 124.7 kg    Examination:   General: Not in pain or dyspnea, deconditioned  Neurology: Awake and alert, non focal  E ENT: no pallor, no icterus, oral mucosa moist Cardiovascular: No JVD. S1-S2 present, rhythmic, no gallops, rubs, or murmurs. Trace bilateral lower extremity edema. Pulmonary: positive breath sounds bilaterally, with no wheezing, or rhonchi, bibasilar rales. Gastrointestinal. Abdomen soft and non tender Skin. No rashes Musculoskeletal: no joint deformities     Data Reviewed: I have personally reviewed following labs and imaging studies  CBC: Recent Labs  Lab 03/07/21 1738 03/08/21 0326  WBC 7.6 8.3  HGB 9.3* 8.2*  HCT 30.2* 26.2*  MCV 77.8* 76.8*  PLT 278 257   Basic Metabolic Panel: Recent Labs  Lab 03/07/21 1738 03/08/21 0326 03/09/21 0418  NA 138 137 140  K 4.3 4.1 3.6  CL 102 106 103  CO2 26 23 27   GLUCOSE 243* 185* 105*  BUN 23 22 25*  CREATININE 1.79* 1.70* 2.25*  CALCIUM 8.8* 8.5* 8.9  MG 1.6*  --   --    GFR: Estimated Creatinine Clearance: 32 mL/min (A) (by C-G formula based on SCr of 2.25 mg/dL (H)). Liver Function Tests: Recent Labs  Lab 03/07/21 1738  AST 24  ALT 19  ALKPHOS 77  BILITOT 0.2*  PROT 7.7  ALBUMIN 3.6   No results for input(s): LIPASE, AMYLASE in the last 168 hours. No results for input(s): AMMONIA in the last 168 hours. Coagulation Profile: No results for input(s): INR, PROTIME in the last 168 hours. Cardiac Enzymes: No results for input(s): CKTOTAL, CKMB, CKMBINDEX, TROPONINI in the last 168 hours. BNP (last 3 results) No  results for input(s): PROBNP in the last 8760 hours. HbA1C: Recent Labs    03/08/21 0326  HGBA1C 9.1*   CBG: Recent Labs  Lab 03/08/21 0557 03/08/21 1049 03/08/21 1645 03/08/21 2059 03/09/21 0557  GLUCAP 154* 149* 88 140* 98   Lipid Profile: Recent Labs    03/08/21 0326  CHOL 137  HDL 48  LDLCALC 75  TRIG 68  CHOLHDL 2.9   Thyroid Function Tests: Recent Labs    03/08/21 0326  TSH 2.514   Anemia Panel: No results for input(s): VITAMINB12, FOLATE, FERRITIN, TIBC, IRON, RETICCTPCT in the last 72 hours.    Radiology Studies: I have reviewed all of the imaging during this hospital visit personally     Scheduled Meds:  allopurinol  300 mg Oral Daily   amLODipine  10 mg Oral Daily   aspirin EC  81 mg Oral Daily   atorvastatin  20 mg Oral Daily   carvedilol  25 mg Oral BID WC   doxazosin  1 mg Oral QHS   enoxaparin (LOVENOX) injection  60 mg Subcutaneous Daily   escitalopram  10 mg Oral Daily   furosemide  60 mg Intravenous TID   hydrALAZINE  50 mg Oral TID   insulin aspart  0-15 Units Subcutaneous TID WC   insulin aspart  0-5 Units Subcutaneous QHS   insulin glargine-yfgn  35 Units Subcutaneous Q0600   irbesartan  150 mg Oral Daily   nitroGLYCERIN  1 inch Topical Q6H   sodium chloride flush  3 mL Intravenous Q12H   Continuous Infusions:  sodium chloride       LOS: 2 days        Angus Amini Annett Gula, MD

## 2021-03-10 ENCOUNTER — Ambulatory Visit: Payer: 59 | Admitting: Family Medicine

## 2021-03-10 ENCOUNTER — Inpatient Hospital Stay (HOSPITAL_COMMUNITY): Payer: 59

## 2021-03-10 LAB — BASIC METABOLIC PANEL
Anion gap: 9 (ref 5–15)
BUN: 29 mg/dL — ABNORMAL HIGH (ref 8–23)
CO2: 29 mmol/L (ref 22–32)
Calcium: 8.4 mg/dL — ABNORMAL LOW (ref 8.9–10.3)
Chloride: 102 mmol/L (ref 98–111)
Creatinine, Ser: 2.45 mg/dL — ABNORMAL HIGH (ref 0.44–1.00)
GFR, Estimated: 22 mL/min — ABNORMAL LOW (ref >60)
Glucose, Bld: 86 mg/dL (ref 70–99)
Potassium: 3.4 mmol/L — ABNORMAL LOW (ref 3.5–5.1)
Sodium: 140 mmol/L (ref 135–145)

## 2021-03-10 LAB — GLUCOSE, CAPILLARY
Glucose-Capillary: 91 mg/dL (ref 70–99)
Glucose-Capillary: 148 mg/dL — ABNORMAL HIGH (ref 70–99)
Glucose-Capillary: 138 mg/dL — ABNORMAL HIGH (ref 70–99)

## 2021-03-10 MED ORDER — ENOXAPARIN SODIUM 40 MG/0.4ML IJ SOSY
40.0000 mg | PREFILLED_SYRINGE | Freq: Every day | INTRAMUSCULAR | Status: DC
  Administered 2021-03-11 – 2021-03-14 (×4): 40 mg via SUBCUTANEOUS
  Filled 2021-03-10 (×4): qty 0.4

## 2021-03-10 NOTE — Progress Notes (Signed)
RT note: Patient refused to wear CPAP tonight. Vital signs stable at this time.

## 2021-03-10 NOTE — Progress Notes (Signed)
PROGRESS NOTE    Lori Mclaughlin  WYO:[id number] DOB: [birth year] DOA: 03/07/2021 PCP: Olive Bass, FNP    Brief Narrative:  [name] was admitted to the hospital with the working diagnosis of acute decompensation of heart failure.    60 year old female past medical history for heart failure, hypertension, chronic kidney disease and type 2 diabetes mellitus who presented with dyspnea.  Reported 2 weeks of worsening dyspnea, more severe over the last 48 hours, to the point where she became symptomatic with minimal efforts.  Positive orthopnea and PND.  15 pound weight gain over the last 2 weeks.  On her initial physical examination her blood pressure was 208/104, heart rate 83, respiratory rate 26, temperature 98.3, oxygen saturation 96% on supplemental oxygen.  Her lungs had decreased breath sounds bilaterally with positive rales but no wheezing, heart S1-S2, present, rhythmic, soft abdomen, positive lower extremity edema.   Sodium 138, potassium 4.3, chloride 102, bicarb 26, glucose 243, BUN 23, creatinine 1.79, magnesium 1.6, BNP 631, high sensitive troponin 15-18-25-28, white count 7.6, hemoglobin 9.3, hematocrit 30.2, platelets 278. SARS COVID-19 negative.   Chest radiograph with positive cardiomegaly, positive hilar vascular congestion, right upper lobe 14 mm nodule, questionable.   EKG 79 bpm, left axis deviation, left anterior fascicular block, normal QTC, sinus rhythm, no significant ST segment or T wave changes.   Placed on IV furosemide for diuresis with good toleration.   Improved volume status and blood pressure.  Pending renal recovery before discharge.    Assessment & Plan:   Principal Problem:   Acute on chronic diastolic CHF (congestive heart failure) (HCC) Active Problems:   Type II diabetes mellitus with renal manifestations, uncontrolled (HCC)   Morbid obesity (HCC)   Hypertensive emergency   Chronic kidney disease (CKD) stage G3b/A1,  moderately decreased glomerular filtration rate (GFR) between 30-44 mL/min/1.73 square meter and albuminuria creatinine ratio less than 30 mg/g (HCC)  Acute on chronic diastolic heart failure exacerbation.  Echocardiogram with LV EF 60 to 65%, mild concentric LVH, Rv systolic function is preserved. No significant valvular disease.   Urine output 1,100 ml, continue to improve hypervolemia.   Blood pressure with better control with systolic 150 to 160 mmHg, Follow up with improvement in pulmonary edema, resolution of right upper lobe nodule (personal interpretation)   Continue with carvedilol (decreased dosed due to bradycardia), hydralazine, amlodipine and ibersartan.  Hold on furosemide today with plan to resume oral diuretic therapy when renal function improves.   2. CKD stage 3b.  Renal function with serum cr at 2,45 with K at 3,4 and serum bicarbonate at 29.  Plan to hold on furosemide for today, her volume status has improved.    3. T2DM/ dyslipidemia. fasting glucose is 86, Plan to decrease basal insulin down to 15 units to prevent hypoglycemia. Patient tolerating po well.   Continue with atorvastatin.     4. Obesity class 3/ OSA. BMI is 55,2,    5. Right upper lobe nodule. follow up chest film with resolution of nodule along with improvement in pulmonary edema.     6. Gout. No active flare, On scheduled allopurinol.    7. Depression. On escitalopram.   Status is: Inpatient  Remains inpatient appropriate because:Inpatient level of care appropriate due to severity of illness  Dispo: The patient is from: Home              Anticipated d/c is to: Home possible dc home tomorrow pending recovery of renal function  Patient currently is not medically stable to d/c.   Difficult to place patient No   DVT prophylaxis: Enoxaparin   Code Status:   full  Family Communication:   No family at the bedside      Subjective: Patient is feeling better, her blood pressure  and volume status have improved, no nausea or vomiting, has been out of bed to the restroom,   Objective: Vitals:   03/09/21 1148 03/09/21 1552 03/09/21 1953 03/10/21 0443  BP: (!) 163/69 (!) 152/67 (!) 157/62 (!) 160/66  Pulse: (!) 53 (!) 50 (!) 57 (!) 51  Resp: 18 20 18 20   Temp: 98.6 F (37 C) 98.1 F (36.7 C) 98.7 F (37.1 C) 98.4 F (36.9 C)  TempSrc: Oral Oral Oral Oral  SpO2: 94% 95% 97% 100%  Weight:    122.3 kg  Height:        Intake/Output Summary (Last 24 hours) at 03/10/2021 1212 Last data filed at 03/10/2021 0945 Gross per 24 hour  Intake 360 ml  Output 1900 ml  Net -1540 ml   Filed Weights   03/08/21 0300 03/09/21 0339 03/10/21 0443  Weight: 128.3 kg 124.7 kg 122.3 kg    Examination:   General: Not in pain or dyspnea.  Neurology: Awake and alert, non focal  E ENT: no pallor, no icterus, oral mucosa moist Cardiovascular: No JVD. S1-S2 present, rhythmic, no gallops, rubs, or murmurs. No lower extremity edema. Pulmonary: positive breath sounds bilaterally, with no wheezing, rhonchi or rales. Gastrointestinal. Abdomen soft and non tender Skin. No rashes Musculoskeletal: no joint deformities     Data Reviewed: I have personally reviewed following labs and imaging studies  CBC: Recent Labs  Lab 03/07/21 1738 03/08/21 0326  WBC 7.6 8.3  HGB 9.3* 8.2*  HCT 30.2* 26.2*  MCV 77.8* 76.8*  PLT 278 257   Basic Metabolic Panel: Recent Labs  Lab 03/07/21 1738 03/08/21 0326 03/09/21 0418 03/10/21 0836  NA 138 137 140 140  K 4.3 4.1 3.6 3.4*  CL 102 106 103 102  CO2 26 23 27 29   GLUCOSE 243* 185* 105* 86  BUN 23 22 25* 29*  CREATININE 1.79* 1.70* 2.25* 2.45*  CALCIUM 8.8* 8.5* 8.9 8.4*  MG 1.6*  --   --   --    GFR: Estimated Creatinine Clearance: 29 mL/min (A) (by C-G formula based on SCr of 2.45 mg/dL (H)). Liver Function Tests: Recent Labs  Lab 03/07/21 1738  AST 24  ALT 19  ALKPHOS 77  BILITOT 0.2*  PROT 7.7  ALBUMIN 3.6   No  results for input(s): LIPASE, AMYLASE in the last 168 hours. No results for input(s): AMMONIA in the last 168 hours. Coagulation Profile: No results for input(s): INR, PROTIME in the last 168 hours. Cardiac Enzymes: No results for input(s): CKTOTAL, CKMB, CKMBINDEX, TROPONINI in the last 168 hours. BNP (last 3 results) No results for input(s): PROBNP in the last 8760 hours. HbA1C: Recent Labs    03/08/21 0326  HGBA1C 9.1*   CBG: Recent Labs  Lab 03/09/21 1105 03/09/21 1706 03/09/21 2119 03/10/21 0604 03/10/21 1053  GLUCAP 138* 142* 146* 91 148*   Lipid Profile: Recent Labs    03/08/21 0326  CHOL 137  HDL 48  LDLCALC 75  TRIG 68  CHOLHDL 2.9   Thyroid Function Tests: Recent Labs    03/08/21 0326  TSH 2.514   Anemia Panel: No results for input(s): VITAMINB12, FOLATE, FERRITIN, TIBC, IRON, RETICCTPCT in the last 72  hours.    Radiology Studies: I have reviewed all of the imaging during this hospital visit personally     Scheduled Meds:  allopurinol  300 mg Oral Daily   amLODipine  10 mg Oral Daily   aspirin EC  81 mg Oral Daily   atorvastatin  20 mg Oral Daily   carvedilol  12.5 mg Oral BID WC   doxazosin  1 mg Oral QHS   enoxaparin (LOVENOX) injection  60 mg Subcutaneous Daily   escitalopram  10 mg Oral Daily   hydrALAZINE  50 mg Oral TID   insulin aspart  0-15 Units Subcutaneous TID WC   insulin aspart  0-5 Units Subcutaneous QHS   insulin glargine-yfgn  35 Units Subcutaneous Q0600   irbesartan  150 mg Oral Daily   nitroGLYCERIN  1 inch Topical Q6H   sodium chloride flush  3 mL Intravenous Q12H   Continuous Infusions:  sodium chloride       LOS: 3 days        Adalyn Pennock Annett Gula, MD

## 2021-03-10 NOTE — Plan of Care (Signed)

## 2021-03-11 ENCOUNTER — Ambulatory Visit: Payer: 59 | Admitting: Endocrinology

## 2021-03-11 LAB — BASIC METABOLIC PANEL
Anion gap: 9 (ref 5–15)
BUN: 35 mg/dL — ABNORMAL HIGH (ref 8–23)
CO2: 29 mmol/L (ref 22–32)
Calcium: 8.7 mg/dL — ABNORMAL LOW (ref 8.9–10.3)
Chloride: 103 mmol/L (ref 98–111)
Creatinine, Ser: 2.64 mg/dL — ABNORMAL HIGH (ref 0.44–1.00)
GFR, Estimated: 20 mL/min — ABNORMAL LOW (ref >60)
Glucose, Bld: 107 mg/dL — ABNORMAL HIGH (ref 70–99)
Potassium: 3.6 mmol/L (ref 3.5–5.1)
Sodium: 141 mmol/L (ref 135–145)

## 2021-03-11 LAB — GLUCOSE, CAPILLARY
Glucose-Capillary: 95 mg/dL (ref 70–99)
Glucose-Capillary: 158 mg/dL — ABNORMAL HIGH (ref 70–99)
Glucose-Capillary: 156 mg/dL — ABNORMAL HIGH (ref 70–99)
Glucose-Capillary: 126 mg/dL — ABNORMAL HIGH (ref 70–99)

## 2021-03-11 NOTE — Progress Notes (Signed)
PROGRESS NOTE    LITZI BINNING  ENI:[id number] DOB: October 07, 1960 DOA: 03/07/2021 PCP: Olive Bass, FNP    Brief Narrative:  [name] was admitted to the hospital with the working diagnosis of acute decompensation of heart failure.    60 year old female past medical history for heart failure, hypertension, chronic kidney disease and type 2 diabetes mellitus who presented with dyspnea.  Reported 2 weeks of worsening dyspnea, more severe over the last 48 hours, to the point where she became symptomatic with minimal efforts.  Positive orthopnea and PND.  15 pound weight gain over the last 2 weeks.  On her initial physical examination her blood pressure was 208/104, heart rate 83, respiratory rate 26, temperature 98.3, oxygen saturation 96% on supplemental oxygen.  Her lungs had decreased breath sounds bilaterally with positive rales but no wheezing, heart S1-S2, present, rhythmic, soft abdomen, positive lower extremity edema.   Sodium 138, potassium 4.3, chloride 102, bicarb 26, glucose 243, BUN 23, creatinine 1.79, magnesium 1.6, BNP 631, high sensitive troponin 15-18-25-28, white count 7.6, hemoglobin 9.3, hematocrit 30.2, platelets 278. SARS COVID-19 negative.   Chest radiograph with positive cardiomegaly, positive hilar vascular congestion, right upper lobe 14 mm nodule, questionable.   EKG 79 bpm, left axis deviation, left anterior fascicular block, normal QTC, sinus rhythm, no significant ST segment or T wave changes.   Placed on IV furosemide for diuresis with good toleration.    Improved volume status and blood pressure.  Pending renal recovery before discharge.      Assessment & Plan:   Principal Problem:   Acute on chronic diastolic CHF (congestive heart failure) (HCC) Active Problems:   Type II diabetes mellitus with renal manifestations, uncontrolled (HCC)   Morbid obesity (HCC)   Hypertensive emergency   Chronic kidney disease (CKD) stage  G3b/A1, moderately decreased glomerular filtration rate (GFR) between 30-44 mL/min/1.73 square meter and albuminuria creatinine ratio less than 30 mg/g (HCC)   Acute on chronic diastolic heart failure exacerbation/ resistant hypertension Echocardiogram with LV EF 60 to 65%, mild concentric LVH, Rv systolic function is preserved. No significant valvular disease.    Urine output 700 ml over last 24 hrs.  Continue blood pressure control with carvedilol (decreased dosed due to bradycardia), Continue with hydralazine, amlodipine and ibersartan.  Patient will benefit from furosemide as needed at discharge for volume overload.   Increase mobility, out of bed to chair tid with meals, PT and OT.    2. AKI on CKD stage 3b.  Old records personally reviewed, serum cr in 05/22 was 2,0   Patient placed on irbesartan on this admission and expected rise in serum cr.  Today renal function with serum cr up to 2,64 with K at 3,6 and serum bicarbonate at  29. Plan to continue close follow up on real function in am, plan to continue ARB at discharge for blood pressure control.    3. T2DM/ dyslipidemia. fasting glucose 107.  Continue with basal insulin 35 units Patient tolerating po well.   On atorvastatin.     4. Obesity class 3/ OSA. BMI is 55,2, Patient has been refusing to use Cpap at night.  Will dc order for Cpap.    5. Right upper lobe nodule. follow up chest film with resolution of nodule along with improvement in pulmonary edema.     6. Gout. No signs of active flare, continue with scheduled allopurinol.    7. Depression. continue with escitalopram.    Patient continue to be at high  risk for worsening renal function   Status is: Inpatient  Remains inpatient appropriate because:Inpatient level of care appropriate due to severity of illness  Dispo: The patient is from: Home              Anticipated d/c is to: Home              Patient currently is not medically stable to d/c.    Difficult to place patient No   DVT prophylaxis: Heparin   Code Status:    full  Family Communication:   I spoke with patient's daughter at the bedside, we talked in detail about patient's condition, plan of care and prognosis and all questions were addressed.     Subjective: Patient is feeling better dyspnea and lower extremity continue to improve, no nausea or vomiting, no chest pain. Dizziness when out of bed.   Objective: Vitals:   03/10/21 1813 03/10/21 2037 03/10/21 2333 03/11/21 0456  BP: (!) 141/50 (!) 162/70 (!) 154/67 (!) 156/73  Pulse: (!) 53 (!) 56 (!) 55 (!) 51  Resp:  18 18 18   Temp: 98.6 F (37 C) 98.1 F (36.7 C) 98.2 F (36.8 C) 98 F (36.7 C)  TempSrc: Oral Oral Oral Oral  SpO2: 93% 98% 97% 100%  Weight:    121.4 kg  Height:        Intake/Output Summary (Last 24 hours) at 03/11/2021 1106 Last data filed at 03/11/2021 03/13/2021 Gross per 24 hour  Intake 841 ml  Output 700 ml  Net 141 ml   Filed Weights   03/09/21 0339 03/10/21 0443 03/11/21 0456  Weight: 124.7 kg 122.3 kg 121.4 kg    Examination:   General: Not in pain or dyspnea, deconditioned  Neurology: Awake and alert, non focal  E ENT: no pallor, no icterus, oral mucosa moist Cardiovascular: No JVD. S1-S2 present, rhythmic, no gallops, rubs, or murmurs. No lower extremity edema. Pulmonary:  positive breath sounds bilaterally, with no wheezing, rhonchi or rales. Gastrointestinal. Abdomen soft and non tender Skin. No rashes Musculoskeletal: no joint deformities     Data Reviewed: I have personally reviewed following labs and imaging studies  CBC: Recent Labs  Lab 03/07/21 1738 03/08/21 0326  WBC 7.6 8.3  HGB 9.3* 8.2*  HCT 30.2* 26.2*  MCV 77.8* 76.8*  PLT 278 257   Basic Metabolic Panel: Recent Labs  Lab 03/07/21 1738 03/08/21 0326 03/09/21 0418 03/10/21 0836 03/11/21 0406  NA 138 137 140 140 141  K 4.3 4.1 3.6 3.4* 3.6  CL 102 106 103 102 103  CO2 26 23 27 29 29   GLUCOSE  243* 185* 105* 86 107*  BUN 23 22 25* 29* 35*  CREATININE 1.79* 1.70* 2.25* 2.45* 2.64*  CALCIUM 8.8* 8.5* 8.9 8.4* 8.7*  MG 1.6*  --   --   --   --    GFR: Estimated Creatinine Clearance: 26.8 mL/min (A) (by C-G formula based on SCr of 2.64 mg/dL (H)). Liver Function Tests: Recent Labs  Lab 03/07/21 1738  AST 24  ALT 19  ALKPHOS 77  BILITOT 0.2*  PROT 7.7  ALBUMIN 3.6   No results for input(s): LIPASE, AMYLASE in the last 168 hours. No results for input(s): AMMONIA in the last 168 hours. Coagulation Profile: No results for input(s): INR, PROTIME in the last 168 hours. Cardiac Enzymes: No results for input(s): CKTOTAL, CKMB, CKMBINDEX, TROPONINI in the last 168 hours. BNP (last 3 results) No results for input(s): PROBNP in the last  8760 hours. HbA1C: No results for input(s): HGBA1C in the last 72 hours. CBG: Recent Labs  Lab 03/09/21 2119 03/10/21 0604 03/10/21 1053 03/10/21 2038 03/11/21 0621  GLUCAP 146* 91 148* 138* 95   Lipid Profile: No results for input(s): CHOL, HDL, LDLCALC, TRIG, CHOLHDL, LDLDIRECT in the last 72 hours. Thyroid Function Tests: No results for input(s): TSH, T4TOTAL, FREET4, T3FREE, THYROIDAB in the last 72 hours. Anemia Panel: No results for input(s): VITAMINB12, FOLATE, FERRITIN, TIBC, IRON, RETICCTPCT in the last 72 hours.    Radiology Studies: I have reviewed all of the imaging during this hospital visit personally     Scheduled Meds:  allopurinol  300 mg Oral Daily   amLODipine  10 mg Oral Daily   aspirin EC  81 mg Oral Daily   atorvastatin  20 mg Oral Daily   carvedilol  12.5 mg Oral BID WC   doxazosin  1 mg Oral QHS   enoxaparin (LOVENOX) injection  40 mg Subcutaneous Daily   escitalopram  10 mg Oral Daily   hydrALAZINE  50 mg Oral TID   insulin aspart  0-15 Units Subcutaneous TID WC   insulin aspart  0-5 Units Subcutaneous QHS   insulin glargine-yfgn  35 Units Subcutaneous Q0600   irbesartan  150 mg Oral Daily    nitroGLYCERIN  1 inch Topical Q6H   sodium chloride flush  3 mL Intravenous Q12H   Continuous Infusions:  sodium chloride       LOS: 4 days        Daronte Shostak Annett Gula, MD

## 2021-03-11 NOTE — Plan of Care (Signed)

## 2021-03-11 NOTE — Progress Notes (Signed)
Pt refuses CPAP for tonight and wants to use oxygen for night rest.

## 2021-03-11 NOTE — Progress Notes (Signed)
  Mobility Specialist Criteria Algorithm Info.  Mobility Team: [facility] elevated:Self regulated Activity: Ambulated in hall; Dangled on edge of bed Range of motion: Active; All extremities Level of assistance: Contact guard assist, steadying assist Assistive device: Other (Comment) (Furniture/Wall Rails) Minutes sitting in chair:  Minutes stood: 3 minutes Minutes ambulated: 3 minutes Distance ambulated (ft): 100 ft (20 + 50 + 30) Mobility response: Tolerated fair; RN notified (Required 3 standing rest breaks) Bed Position: Semi-fowlers  Patient received dangling EOB, willing to participate in mobility. Prior to ambulation, she complained of feeling dizziness stating she had just returned from ambulating from restroom. VSS. BP was 149/66. Patient stood and ambulated in hallway 100 feet at min guard with slightly unsteady gait requiring use of furniture and wall rails to steady. Complained of feeling slightly lightheaded and that her legs were increasingly feeling weaker as distance increased. Distance limited due to lightheadedness, tolerated ambulation fair. Upon returning to room BP was 190/66 standing and 167/64 sitting. Lightheadedness subsided immediately with seated rest. RN notified.   03/11/2021 3:07 PM

## 2021-03-12 ENCOUNTER — Inpatient Hospital Stay (HOSPITAL_COMMUNITY): Payer: 59

## 2021-03-12 DIAGNOSIS — R609 Edema, unspecified: Secondary | ICD-10-CM | POA: Diagnosis not present

## 2021-03-12 LAB — BASIC METABOLIC PANEL
Anion gap: 7 (ref 5–15)
BUN: 41 mg/dL — ABNORMAL HIGH (ref 8–23)
CO2: 29 mmol/L (ref 22–32)
Calcium: 8.6 mg/dL — ABNORMAL LOW (ref 8.9–10.3)
Chloride: 104 mmol/L (ref 98–111)
Creatinine, Ser: 2.71 mg/dL — ABNORMAL HIGH (ref 0.44–1.00)
GFR, Estimated: 19 mL/min — ABNORMAL LOW (ref >60)
Glucose, Bld: 67 mg/dL — ABNORMAL LOW (ref 70–99)
Potassium: 3.6 mmol/L (ref 3.5–5.1)
Sodium: 140 mmol/L (ref 135–145)

## 2021-03-12 LAB — GLUCOSE, CAPILLARY
Glucose-Capillary: 77 mg/dL (ref 70–99)
Glucose-Capillary: 139 mg/dL — ABNORMAL HIGH (ref 70–99)
Glucose-Capillary: 174 mg/dL — ABNORMAL HIGH (ref 70–99)
Glucose-Capillary: 268 mg/dL — ABNORMAL HIGH (ref 70–99)

## 2021-03-12 MED ORDER — INSULIN GLARGINE-YFGN 100 UNIT/ML ~~LOC~~ SOLN
33.0000 [IU] | Freq: Every day | SUBCUTANEOUS | Status: DC
  Administered 2021-03-13 – 2021-03-14 (×2): 33 [IU] via SUBCUTANEOUS
  Filled 2021-03-12 (×5): qty 0.33

## 2021-03-12 NOTE — Progress Notes (Signed)
Upper extremity venous has been completed.   Preliminary results in CV Proc.   Blanch Media 03/12/2021 11:32 AM

## 2021-03-12 NOTE — Progress Notes (Signed)
PROGRESS NOTE    Lori Mclaughlin  VWU:[id number] DOB: Dec 30, 1960 DOA: 03/07/2021 PCP: Olive Bass, FNP    Chief Complaint  Patient presents with   Shortness of Breath    Brief Narrative:   History of hypertension, insulin-dependent type 2 diabetes, diastolic CHF presented with short of breath, increased edema, found to be in acute diastolic CHF exacerbation  Subjective:  Denies chest pain, no sob at rest, on room air at rest ,bilateral lower extremity edema has resolved,  C/o left arm edema Cr continue to trend up  Assessment & Plan:   Principal Problem:   Acute on chronic diastolic CHF (congestive heart failure) (HCC) Active Problems:   Type II diabetes mellitus with renal manifestations, uncontrolled (HCC)   Morbid obesity (HCC)   Hypertensive emergency   Chronic kidney disease (CKD) stage G3b/A1, moderately decreased glomerular filtration rate (GFR) between 30-44 mL/min/1.73 square meter and albuminuria creatinine ratio less than 30 mg/g (HCC)   Acute on chronic diastolic chf Edema, short of breath have resolved with IV diuresis Diuresis on hold due to elevation of creatinine Discussed with patient likely discharge her home on Lasix Monday Wednesday Friday, recommend daily weight Follow-up with cardiology, she has an appointment with Dr. Mayford Knife on 8/22  HTN Currently on Norvasc, Coreg, Cardura, hydralazine, Avapro She was put on nitroglycerin patch since admission, will discontinue, plan to start Imdur if blood pressure continue to be elevated She has an appointment with Dr. Duke Salvia hypertension clinic on 8/23 Report had outpatient sleep study, in the process of getting CPAP  Insulin-dependent type 2 diabetes Uncontrolled, A1c 9% Appear to be on Toujeo 70 units every morning at home, Kombiglyze XR [phone] mg daily -Has been on Semglee 35 units daily, a.m. blood glucose 67, decrease to 33 daily -Continue monitor  AKI on CKDIIIb -Creatinine on  presentation was 1.7, continue to trend up today creatinine is 2.7 -Continue hold Lasix, get renal ultrasound -Repeat BMP in the morning, hold avapro if cr continue to go up  Class III obesity: Body mass index is 52.52 kg/m.[name] Encourage lift style modification for weight loss OSA , just has sleep study, awaiting cpap arrangement ( reported on back order) F/u with pulmonology  History of gout, no active flare, continue allopurinol  Left upper extremity edema, will get venous Doppler rule out DVT, elevate left arm      Unresulted Labs (From admission, onward)     Start     Ordered   03/13/21 0500  Basic metabolic panel  Tomorrow morning,   R       Question:  Specimen collection method  Answer:  Lab=Lab collect   03/12/21 1011              DVT prophylaxis: enoxaparin (LOVENOX) injection 40 mg Start: 03/11/21 1000   Code Status: Full Family Communication: Patient Disposition:   Status is: Inpatient   Dispo: The patient is from: home              Anticipated d/c is to: home              Anticipated d/c date is: on 8/18 if cr improves, and venous doppler no DVT                Consultants:  None  Procedures:  None  Antimicrobials:    None   Objective: Vitals:   03/11/21 1123 03/11/21 1951 03/12/21 0537 03/12/21 0539  BP: (!) 147/67 (!) 149/74  (!) 154/69  Pulse: [name])  51 (!) 51  (!) 53  Resp: 19 20  20   Temp: 98 F (36.7 C) 98 F (36.7 C)  98.4 F (36.9 C)  TempSrc: Oral Oral  Oral  SpO2: 95% 97%  100%  Weight:   122 kg   Height:        Intake/Output Summary (Last 24 hours) at 03/12/2021 1012 Last data filed at 03/12/2021 03/14/2021 Gross per 24 hour  Intake 1164 ml  Output 900 ml  Net 264 ml   Filed Weights   03/10/21 0443 03/11/21 0456 03/12/21 0537  Weight: 122.3 kg 121.4 kg 122 kg    Examination:  General exam: calm, NAD Respiratory system: Clear to auscultation. Respiratory effort normal. Cardiovascular system: S1 & S2 heard, RRR. No JVD,  no murmur, No pedal edema. Gastrointestinal system: Abdomen is nondistended, soft and nontender.  Normal bowel sounds heard. Central nervous system: Alert and oriented. No focal neurological deficits. Extremities: Bilateral lower extremity edema has resolved, left arm edema Skin: No rashes, lesions or ulcers Psychiatry: Judgement and insight appear normal. Mood & affect appropriate.     Data Reviewed: I have personally reviewed following labs and imaging studies  CBC: Recent Labs  Lab 03/07/21 1738 03/08/21 0326  WBC 7.6 8.3  HGB 9.3* 8.2*  HCT 30.2* 26.2*  MCV 77.8* 76.8*  PLT 278 257    Basic Metabolic Panel: Recent Labs  Lab 03/07/21 1738 03/08/21 0326 03/09/21 0418 03/10/21 0836 03/11/21 0406 03/12/21 0251  NA 138 137 140 140 141 140  K 4.3 4.1 3.6 3.4* 3.6 3.6  CL 102 106 103 102 103 104  CO2 26 23 27 29 29 29   GLUCOSE 243* 185* 105* 86 107* 67*  BUN 23 22 25* 29* 35* 41*  CREATININE 1.79* 1.70* 2.25* 2.45* 2.64* 2.71*  CALCIUM 8.8* 8.5* 8.9 8.4* 8.7* 8.6*  MG 1.6*  --   --   --   --   --     GFR: Estimated Creatinine Clearance: 26.2 mL/min (A) (by C-G formula based on SCr of 2.71 mg/dL (H)).  Liver Function Tests: Recent Labs  Lab 03/07/21 1738  AST 24  ALT 19  ALKPHOS 77  BILITOT 0.2*  PROT 7.7  ALBUMIN 3.6    CBG: Recent Labs  Lab 03/11/21 0621 03/11/21 1125 03/11/21 1635 03/11/21 2100 03/12/21 0655  GLUCAP 95 158* 156* 126* 77     Recent Results (from the past 240 hour(s))  Resp Panel by RT-PCR (Flu A&B, Covid) Nasopharyngeal Swab     Status: None   Collection Time: 03/07/21  8:41 PM   Specimen: Nasopharyngeal Swab; Nasopharyngeal(NP) swabs in vial transport medium  Result Value Ref Range Status   SARS Coronavirus 2 by RT PCR NEGATIVE NEGATIVE Final    Comment: (NOTE) SARS-CoV-2 target nucleic acids are NOT DETECTED.  The SARS-CoV-2 RNA is generally detectable in upper respiratory specimens during the acute phase of infection.  The lowest concentration of SARS-CoV-2 viral copies this assay can detect is 138 copies/mL. A negative result does not preclude SARS-Cov-2 infection and should not be used as the sole basis for treatment or other patient management decisions. A negative result may occur with  improper specimen collection/handling, submission of specimen other than nasopharyngeal swab, presence of viral mutation(s) within the areas targeted by this assay, and inadequate number of viral copies(<138 copies/mL). A negative result must be combined with clinical observations, patient history, and epidemiological information. The expected result is Negative.  Fact Sheet for Patients:  03/14/21  Fact Sheet for Healthcare Providers:  SeriousBroker.it  This test is no t yet approved or cleared by the [place] FDA and  has been authorized for detection and/or diagnosis of SARS-CoV-2 by FDA under an Emergency Use Authorization (EUA). This EUA will remain  in effect (meaning this test can be used) for the duration of the COVID-19 declaration under Section 564(b)(1) of the Act, 21 U.S.C.section 360bbb-3(b)(1), unless the authorization is terminated  or revoked sooner.       Influenza A by PCR NEGATIVE NEGATIVE Final   Influenza B by PCR NEGATIVE NEGATIVE Final    Comment: (NOTE) The Xpert Xpress SARS-CoV-2/FLU/RSV plus assay is intended as an aid in the diagnosis of influenza from Nasopharyngeal swab specimens and should not be used as a sole basis for treatment. Nasal washings and aspirates are unacceptable for Xpert Xpress SARS-CoV-2/FLU/RSV testing.  Fact Sheet for Patients: BloggerCourse.com  Fact Sheet for Healthcare Providers: SeriousBroker.it  This test is not yet approved or cleared by the [place] FDA and has been authorized for detection and/or diagnosis of SARS-CoV-2 by FDA  under an Emergency Use Authorization (EUA). This EUA will remain in effect (meaning this test can be used) for the duration of the COVID-19 declaration under Section 564(b)(1) of the Act, 21 U.S.C. section 360bbb-3(b)(1), unless the authorization is terminated or revoked.  Performed at [facility], [address] [place], [place] [address]          Radiology Studies: No results found.      Scheduled Meds:  allopurinol  300 mg Oral Daily   amLODipine  10 mg Oral Daily   aspirin EC  81 mg Oral Daily   atorvastatin  20 mg Oral Daily   carvedilol  12.5 mg Oral BID WC   doxazosin  1 mg Oral QHS   enoxaparin (LOVENOX) injection  40 mg Subcutaneous Daily   escitalopram  10 mg Oral Daily   hydrALAZINE  50 mg Oral TID   insulin aspart  0-15 Units Subcutaneous TID WC   insulin aspart  0-5 Units Subcutaneous QHS   insulin glargine-yfgn  35 Units Subcutaneous Q0600   irbesartan  150 mg Oral Daily   nitroGLYCERIN  1 inch Topical Q6H   sodium chloride flush  3 mL Intravenous Q12H   Continuous Infusions:  sodium chloride       LOS: 5 days   Time spent: 35 mins Greater than 50% of this time was spent in counseling, explanation of diagnosis, planning of further management, and coordination of care.   Voice Recognition Reubin Milan dictation system was used to create this note, attempts have been made to correct errors. Please contact the author with questions and/or clarifications.   Albertine Grates, MD PhD FACP [facility]  Available via Epic secure chat 7am-7pm for nonurgent issues Please page for urgent issues To page the attending provider between 7A-7P or the covering provider during after hours 7P-7A, please log into the web site [email or web address] and access using universal [place] password for that web site. If you do not have the password, please call the hospital operator.    03/12/2021, 10:12 AM

## 2021-03-12 NOTE — Evaluation (Signed)
Physical Therapy Evaluation & Discharge Patient Details Name: Lori Mclaughlin MRN: [id number] DOB: 05-28-1961 Today's Date: 03/12/2021   History of Present Illness  Pt is a 60 y.o. female admitted 03/07/21 with dyspnea, orthopnea, weight gain. Workup for CHF exacerbation, HTN, AKI. PMH includes CHF, CKD, DM2, obesity, OSA, gout, depression.   Clinical Impression  Patient evaluated by Physical Therapy with no further acute PT needs identified. PTA, pt independent, drives, works. Today, pt moving independently in room, supervision for hallway ambulation secondary to c/o fatigue; pt declined use of DME. Educ re: activity recommendations, energy conservation. All education has been completed and the patient has no further questions. Acute PT is signing off. Thank you for this referral.  SpO2 93% on RA with ambulation    Follow Up Recommendations No PT follow up;Supervision - Intermittent    Equipment Recommendations  None recommended by PT    Recommendations for Other Services       Precautions / Restrictions Precautions Precautions: Fall Restrictions Weight Bearing Restrictions: No      Mobility  Bed Mobility Overal bed mobility: Independent                  Transfers Overall transfer level: Independent Equipment used: None                Ambulation/Gait Ambulation/Gait assistance: Supervision Gait Distance (Feet): 180 Feet Assistive device: None Gait Pattern/deviations: Step-through pattern;Decreased stride length Gait velocity: Decreased   General Gait Details: Slow, mostly steady gait without DME, intermittent UE support on rail/furniture for stability; intermittent standing rest breaks secondary to fatigue and SOB. Pt declines use of DME  Stairs            Wheelchair Mobility    Modified Rankin (Stroke Patients Only)       Balance Overall balance assessment: Mild deficits observed, not formally tested                                            Pertinent Vitals/Pain Pain Assessment: No/denies pain    Home Living Family/patient expects to be discharged to:: Private residence Living Arrangements: Children;Other relatives (son, 2 grandkids) Available Help at Discharge: Family;Available PRN/intermittently Type of Home: House Home Access: Stairs to enter Entrance Stairs-Rails: Right Entrance Stairs-Number of Steps: 3 Home Layout: One level Home Equipment: None      Prior Function Level of Independence: Independent         Comments: Independent, drives, works Clinical biochemist at [employer]        Extremity/Trunk Assessment   Upper Extremity Assessment Upper Extremity Assessment: Overall WFL for tasks assessed    Lower Extremity Assessment Lower Extremity Assessment: Overall WFL for tasks assessed       Communication   Communication: No difficulties  Cognition Arousal/Alertness: Awake/alert Behavior During Therapy: WFL for tasks assessed/performed Overall Cognitive Status: Within Functional Limits for tasks assessed                                        General Comments General comments (skin integrity, edema, etc.): SpO2 93% on RA with ambulation. Educ re: activity recommendations, energy conservation (handout provided)    Exercises     Assessment/Plan    PT Assessment Patent does not need any  further PT services  PT Problem List         PT Treatment Interventions      PT Goals (Current goals can be found in the Care Plan section)  Acute Rehab PT Goals PT Goal Formulation: All assessment and education complete, DC therapy    Frequency     Barriers to discharge        Co-evaluation               AM-PAC PT "6 Clicks" Mobility  Outcome Measure Help needed turning from your back to your side while in a flat bed without using bedrails?: None Help needed moving from lying on your back to sitting on the side of a flat bed  without using bedrails?: None Help needed moving to and from a bed to a chair (including a wheelchair)?: None Help needed standing up from a chair using your arms (e.g., wheelchair or bedside chair)?: None Help needed to walk in hospital room?: A Little Help needed climbing 3-5 steps with a railing? : A Little 6 Click Score: 22    End of Session   Activity Tolerance: Patient tolerated treatment well Patient left: in chair;with call bell/phone within reach Nurse Communication: Mobility status PT Visit Diagnosis: Other abnormalities of gait and mobility (R26.89)    Time: 2952-8413 PT Time Calculation (min) (ACUTE ONLY): 20 min   Charges:   PT Evaluation $PT Eval Low Complexity: 1 Low     Ina Homes, PT, DPT Acute Rehabilitation Services  Pager [phone] Office [phone]  Malachy Chamber 03/12/2021, 10:40 AM

## 2021-03-12 NOTE — Progress Notes (Signed)
Inpatient Diabetes Program Recommendations  AACE/ADA: New Consensus Statement on Inpatient Glycemic Control   Target Ranges:  Prepandial:   less than 140 mg/dL      Peak postprandial:   less than 180 mg/dL (1-2 hours)      Critically ill patients:  140 - 180 mg/dL  Results for TRENT, THEISEN (MRN [id number]) as of 03/12/2021 10:41  Ref. Range 03/11/2021 06:21 03/11/2021 11:25 03/11/2021 16:35 03/11/2021 21:00 03/12/2021 06:55  Glucose-Capillary Latest Ref Range: 70 - 99 mg/dL 95 557 (H) 322 (H) 025 (H) 77    Review of Glycemic Control  Diabetes history: DM2 Outpatient Diabetes medications: Toujeo 70 units QAM, Kombiglyze XR [phone] mg daily Current orders for Inpatient glycemic control: Semglee 35 units daily, Novolog 0-15 TID, Novolog 0-5 units QHS  Inpatient Diabetes Program Recommendations:    Insulin: Please consider decreasing Semglee 33 units daily.  Thanks, Orlando Penner, RN, MSN, CDE Diabetes Coordinator Inpatient Diabetes Program [phone] (Team Pager from 8am to 5pm)

## 2021-03-13 ENCOUNTER — Inpatient Hospital Stay (HOSPITAL_COMMUNITY): Payer: 59

## 2021-03-13 LAB — URINALYSIS, ROUTINE W REFLEX MICROSCOPIC
Bilirubin Urine: NEGATIVE
Glucose, UA: 150 mg/dL — AB
Hgb urine dipstick: NEGATIVE
Ketones, ur: NEGATIVE mg/dL
Leukocytes,Ua: NEGATIVE
Nitrite: NEGATIVE
Protein, ur: 100 mg/dL — AB
Specific Gravity, Urine: 1.015 (ref 1.005–1.030)
pH: 5 (ref 5.0–8.0)

## 2021-03-13 LAB — BASIC METABOLIC PANEL
Anion gap: 6 (ref 5–15)
BUN: 50 mg/dL — ABNORMAL HIGH (ref 8–23)
CO2: 31 mmol/L (ref 22–32)
Calcium: 8.5 mg/dL — ABNORMAL LOW (ref 8.9–10.3)
Chloride: 100 mmol/L (ref 98–111)
Creatinine, Ser: 2.82 mg/dL — ABNORMAL HIGH (ref 0.44–1.00)
GFR, Estimated: 18 mL/min — ABNORMAL LOW (ref >60)
Glucose, Bld: 240 mg/dL — ABNORMAL HIGH (ref 70–99)
Potassium: 3.7 mmol/L (ref 3.5–5.1)
Sodium: 137 mmol/L (ref 135–145)

## 2021-03-13 LAB — GLUCOSE, CAPILLARY
Glucose-Capillary: 199 mg/dL — ABNORMAL HIGH (ref 70–99)
Glucose-Capillary: 209 mg/dL — ABNORMAL HIGH (ref 70–99)
Glucose-Capillary: 191 mg/dL — ABNORMAL HIGH (ref 70–99)
Glucose-Capillary: 189 mg/dL — ABNORMAL HIGH (ref 70–99)

## 2021-03-13 MED ORDER — HYDRALAZINE HCL 50 MG PO TABS
75.0000 mg | ORAL_TABLET | Freq: Three times a day (TID) | ORAL | Status: DC
  Administered 2021-03-13 – 2021-03-14 (×2): 75 mg via ORAL
  Filled 2021-03-13 (×2): qty 1

## 2021-03-13 MED ORDER — CARVEDILOL 6.25 MG PO TABS: 6.2500 mg | ORAL_TABLET | Freq: Two times a day (BID) | ORAL | Status: DC

## 2021-03-13 NOTE — Progress Notes (Signed)
PROGRESS NOTE    Lori Mclaughlin  ZHY:[id number] DOB: 12/29/1960 DOA: 03/07/2021 PCP: Olive BassMurray, Laura Woodruff, FNP    Chief Complaint  Patient presents with   Shortness of Breath    Brief Narrative:   History of hypertension, insulin-dependent type 2 diabetes, diastolic CHF presented with short of breath, increased edema, found to be in acute diastolic CHF exacerbation  Subjective:  Denies chest pain, no sob at rest, on room air at rest ,bilateral lower extremity edema has resolved,   Cr continue to trend up, denies urinary symptoms, reports poor appetite  Had bradycardia down to 37 for 1-2 seconds when sleep, heart rate back to baseline once awake  Assessment & Plan:   Principal Problem:   Acute on chronic diastolic CHF (congestive heart failure) (HCC) Active Problems:   Type II diabetes mellitus with renal manifestations, uncontrolled (HCC)   Morbid obesity (HCC)   Hypertensive emergency   Chronic kidney disease (CKD) stage G3b/A1, moderately decreased glomerular filtration rate (GFR) between 30-44 mL/min/1.73 square meter and albuminuria creatinine ratio less than 30 mg/g (HCC)   Acute on chronic diastolic chf Edema, short of breath have resolved with IV diuresis Diuresis on hold due to elevation of creatinine Discussed with patient likely discharge her home on Lasix Monday Wednesday Friday, recommend daily weight Follow-up with cardiology, she has an appointment with Dr. Mayford Knifeurner on 8/22  HTN Currently on Norvasc, Coreg, Cardura, hydralazine, Avapro She was put on nitroglycerin patch since admission, will discontinue, plan to start Imdur if blood pressure continue to be elevated She has an appointment with Dr. Duke Salviaandolph hypertension clinic on 8/23 Report had outpatient sleep study, in the process of getting CPAP  Sinus bradycardia -low 50's when awake, dropped to 37 briefly when sleep, back to baseline when awake -tsh unremarkable -decrease coreg, she reports  in the process of getting cpap -she is advised to follow up with cardiology next week  Insulin-dependent type 2 diabetes Uncontrolled, A1c 9% Appear to be on Toujeo 70 units every morning at home, Kombiglyze XR [phone] mg daily -Has been on Semglee 35 units daily, a.m. blood glucose 67, decrease to 33 daily -Continue monitor  AKI on CKDIIIb -Creatinine on presentation was 1.7, continue to trend up today creatinine is 2.8 -renal ultrasound no obstruction -Repeat BMP in the morning, hold avapro , hold lasix  Class III obesity: Body mass index is 52.95 kg/m.[name]. Encourage lift style modification for weight loss OSA , just has sleep study, awaiting cpap arrangement ( reported on back order) F/u with pulmonology  History of gout, no active flare, hold  allopurinol due to elevated cr  Left upper extremity edema, venous Doppler negative for DVT, elevate left arm      Unresulted Labs (From admission, onward)     Start     Ordered   03/14/21 0500  Basic metabolic panel  Tomorrow morning,   R       Question:  Specimen collection method  Answer:  Lab=Lab collect   03/13/21 0842              DVT prophylaxis: enoxaparin (LOVENOX) injection 40 mg Start: 03/11/21 1000   Code Status: Full Family Communication: Patient Disposition:   Status is: Inpatient   Dispo: The patient is from: home              Anticipated d/c is to: home              Anticipated d/c date is: on 8/19 if cr  improves,                Consultants:  None  Procedures:  None  Antimicrobials:    None   Objective: Vitals:   03/13/21 0144 03/13/21 0202 03/13/21 0918 03/13/21 1158  BP:  (!) 136/53 (!) 153/60 (!) 158/64  Pulse:  (!) 47 (!) 57 (!) 48  Resp:  17 18 17   Temp:  97.9 F (36.6 C) 98.6 F (37 C) 98.3 F (36.8 C)  TempSrc:  Oral Oral   SpO2:  99% 99% 96%  Weight: 123 kg     Height:        Intake/Output Summary (Last 24 hours) at 03/13/2021 1716 Last data filed at 03/13/2021  1200 Gross per 24 hour  Intake 540 ml  Output 600 ml  Net -60 ml   Filed Weights   03/11/21 0456 03/12/21 0537 03/13/21 0144  Weight: 121.4 kg 122 kg 123 kg    Examination:  General exam: calm, NAD Respiratory system: Clear to auscultation. Respiratory effort normal. Cardiovascular system: S1 & S2 heard, RRR. No JVD, no murmur, No pedal edema. Gastrointestinal system: Abdomen is nondistended, soft and nontender.  Normal bowel sounds heard. Central nervous system: Alert and oriented. No focal neurological deficits. Extremities: Bilateral lower extremity edema has resolved, left arm edema Skin: No rashes, lesions or ulcers Psychiatry: Judgement and insight appear normal. Mood & affect appropriate.     Data Reviewed: I have personally reviewed following labs and imaging studies  CBC: Recent Labs  Lab 03/07/21 1738 03/08/21 0326  WBC 7.6 8.3  HGB 9.3* 8.2*  HCT 30.2* 26.2*  MCV 77.8* 76.8*  PLT 278 257    Basic Metabolic Panel: Recent Labs  Lab 03/07/21 1738 03/08/21 0326 03/09/21 0418 03/10/21 0836 03/11/21 0406 03/12/21 0251 03/13/21 0119  NA 138   < > 140 140 141 140 137  K 4.3   < > 3.6 3.4* 3.6 3.6 3.7  CL 102   < > 103 102 103 104 100  CO2 26   < > 27 29 29 29 31   GLUCOSE 243*   < > 105* 86 107* 67* 240*  BUN 23   < > 25* 29* 35* 41* 50*  CREATININE 1.79*   < > 2.25* 2.45* 2.64* 2.71* 2.82*  CALCIUM 8.8*   < > 8.9 8.4* 8.7* 8.6* 8.5*  MG 1.6*  --   --   --   --   --   --    < > = values in this interval not displayed.    GFR: Estimated Creatinine Clearance: 25.3 mL/min (A) (by C-G formula based on SCr of 2.82 mg/dL (H)).  Liver Function Tests: Recent Labs  Lab 03/07/21 1738  AST 24  ALT 19  ALKPHOS 77  BILITOT 0.2*  PROT 7.7  ALBUMIN 3.6    CBG: Recent Labs  Lab 03/12/21 1640 03/12/21 2049 03/13/21 0601 03/13/21 1159 03/13/21 1603  GLUCAP 174* 268* 199* 209* 191*     Recent Results (from the past 240 hour(s))  Resp Panel by  RT-PCR (Flu A&B, Covid) Nasopharyngeal Swab     Status: None   Collection Time: 03/07/21  8:41 PM   Specimen: Nasopharyngeal Swab; Nasopharyngeal(NP) swabs in vial transport medium  Result Value Ref Range Status   SARS Coronavirus 2 by RT PCR NEGATIVE NEGATIVE Final    Comment: (NOTE) SARS-CoV-2 target nucleic acids are NOT DETECTED.  The SARS-CoV-2 RNA is generally detectable in upper respiratory specimens during the  acute phase of infection. The lowest concentration of SARS-CoV-2 viral copies this assay can detect is 138 copies/mL. A negative result does not preclude SARS-Cov-2 infection and should not be used as the sole basis for treatment or other patient management decisions. A negative result may occur with  improper specimen collection/handling, submission of specimen other than nasopharyngeal swab, presence of viral mutation(s) within the areas targeted by this assay, and inadequate number of viral copies(<138 copies/mL). A negative result must be combined with clinical observations, patient history, and epidemiological information. The expected result is Negative.  Fact Sheet for Patients:  BloggerCourse.com  Fact Sheet for Healthcare Providers:  SeriousBroker.it  This test is no t yet approved or cleared by the [place] FDA and  has been authorized for detection and/or diagnosis of SARS-CoV-2 by FDA under an Emergency Use Authorization (EUA). This EUA will remain  in effect (meaning this test can be used) for the duration of the COVID-19 declaration under Section 564(b)(1) of the Act, 21 U.S.C.section 360bbb-3(b)(1), unless the authorization is terminated  or revoked sooner.       Influenza A by PCR NEGATIVE NEGATIVE Final   Influenza B by PCR NEGATIVE NEGATIVE Final    Comment: (NOTE) The Xpert Xpress SARS-CoV-2/FLU/RSV plus assay is intended as an aid in the diagnosis of influenza from Nasopharyngeal swab  specimens and should not be used as a sole basis for treatment. Nasal washings and aspirates are unacceptable for Xpert Xpress SARS-CoV-2/FLU/RSV testing.  Fact Sheet for Patients: BloggerCourse.com  Fact Sheet for Healthcare Providers: SeriousBroker.it  This test is not yet approved or cleared by the [place] FDA and has been authorized for detection and/or diagnosis of SARS-CoV-2 by FDA under an Emergency Use Authorization (EUA). This EUA will remain in effect (meaning this test can be used) for the duration of the COVID-19 declaration under Section 564(b)(1) of the Act, 21 U.S.C. section 360bbb-3(b)(1), unless the authorization is terminated or revoked.  Performed at [facility], [address] [place], [place] [address]          Radiology Studies: US RENAL  Result Date: 03/13/2021 CLINICAL DATA:  Elevated creatinine EXAM: RENAL / URINARY TRACT ULTRASOUND COMPLETE COMPARISON:  None. FINDINGS: Suboptimal detail on both sides due to narrow sonographic windows and soft tissue attenuation. Right Kidney: Renal measurements: 10 x 5 x 6 cm = volume: 150 mL. Echogenicity within normal limits. No mass or hydronephrosis visualized. Left Kidney: Renal measurements: 10 x 6 x 5 cm = volume: 160 mL. Echogenicity within normal limits. No mass or hydronephrosis visualized. Bladder: Appears normal for degree of bladder distention. IMPRESSION: Normal renal ultrasound. Electronically Signed   By: Marnee Spring M.D.   On: 03/13/2021 08:53   VAS [place] UPPER EXTREMITY VENOUS DUPLEX  Result Date: 03/12/2021 UPPER VENOUS STUDY  Patient Name:  NAJIYAH PARIS  Date of Exam:   03/12/2021 Medical Rec #: [id number]               Accession #:    [id number] Date of Birth: 1961/05/27               Patient Gender: F Patient Age:   61 years Exam Location:  [facility] Procedure:      VAS [place] UPPER EXTREMITY VENOUS DUPLEX Referring  Phys: Parke Poisson Drayson Dorko --------------------------------------------------------------------------------  Indications: Edema Limitations: Body habitus. Comparison Study: no prior Performing Technologist: [place] Ponder RVS  Examination Guidelines: A complete evaluation includes B-mode imaging, spectral Doppler, color Doppler,  and power Doppler as needed of all accessible portions of each vessel. Bilateral testing is considered an integral part of a complete examination. Limited examinations for reoccurring indications may be performed as noted.  Right Findings: +----------+------------+---------+-----------+----------+-------+ RIGHT     CompressiblePhasicitySpontaneousPropertiesSummary +----------+------------+---------+-----------+----------+-------+ Subclavian               Yes       Yes                      +----------+------------+---------+-----------+----------+-------+  Left Findings: +----------+------------+---------+-----------+----------+-------+ LEFT      CompressiblePhasicitySpontaneousPropertiesSummary +----------+------------+---------+-----------+----------+-------+ IJV           Full       Yes       Yes                      +----------+------------+---------+-----------+----------+-------+ Subclavian    Full       Yes       Yes                      +----------+------------+---------+-----------+----------+-------+ Axillary      Full       Yes       Yes                      +----------+------------+---------+-----------+----------+-------+ Brachial      Full       Yes       Yes                      +----------+------------+---------+-----------+----------+-------+ Radial        Full                                          +----------+------------+---------+-----------+----------+-------+ Ulnar         Full                                          +----------+------------+---------+-----------+----------+-------+ Cephalic      Full                                           +----------+------------+---------+-----------+----------+-------+ Basilic       Full                                          +----------+------------+---------+-----------+----------+-------+  Summary:  Right: No evidence of thrombosis in the subclavian.  Left: No evidence of deep vein thrombosis in the upper extremity. No evidence of superficial vein thrombosis in the upper extremity.  *See table(s) above for measurements and observations.  Diagnosing physician: Coral Else MD Electronically signed by Coral Else MD on 03/12/2021 at 10:39:02 PM.    Final         Scheduled Meds:  amLODipine  10 mg Oral Daily   aspirin EC  81 mg Oral Daily   atorvastatin  20 mg Oral Daily   [START ON 03/14/2021] carvedilol  6.25 mg Oral BID WC   doxazosin  1 mg Oral QHS   enoxaparin (LOVENOX) injection  40 mg Subcutaneous Daily   escitalopram  10 mg Oral Daily   hydrALAZINE  75 mg Oral TID   insulin aspart  0-15 Units Subcutaneous TID WC   insulin aspart  0-5 Units Subcutaneous QHS   insulin glargine-yfgn  33 Units Subcutaneous Q0600   sodium chloride flush  3 mL Intravenous Q12H   Continuous Infusions:  sodium chloride       LOS: 6 days   Time spent: 35 mins Greater than 50% of this time was spent in counseling, explanation of diagnosis, planning of further management, and coordination of care.   Voice Recognition Reubin Milan dictation system was used to create this note, attempts have been made to correct errors. Please contact the author with questions and/or clarifications.   Albertine Grates, MD PhD FACP [facility]  Available via Epic secure chat 7am-7pm for nonurgent issues Please page for urgent issues To page the attending provider between 7A-7P or the covering provider during after hours 7P-7A, please log into the web site [email or web address] and access using universal [place] password for that web site. If you do not have the password, please call  the hospital operator.    03/13/2021, 5:16 PM

## 2021-03-13 NOTE — Progress Notes (Signed)
Inpatient Diabetes Program Recommendations  AACE/ADA: New Consensus Statement on Inpatient Glycemic Control   Target Ranges:  Prepandial:   less than 140 mg/dL      Peak postprandial:   less than 180 mg/dL (1-2 hours)      Critically ill patients:  140 - 180 mg/dL   Results for Lori Mclaughlin, Lori Mclaughlin (MRN [id number]) as of 03/13/2021 10:15  Ref. Range 03/12/2021 06:55 03/12/2021 11:46 03/12/2021 16:40 03/12/2021 20:49 03/13/2021 06:01  Glucose-Capillary Latest Ref Range: 70 - 99 mg/dL 77 633 (H) 354 (H) 562 (H) 199 (H)   Review of Glycemic Control  Diabetes history: DM2 Outpatient Diabetes medications: Toujeo 70 units QAM, Kombiglyze XR [phone] mg daily Current orders for Inpatient glycemic control: Semglee 33 units daily, Novolog 0-15 TID, Novolog 0-5 units QHS   Inpatient Diabetes Program Recommendations:    Insulin: Please consider ordering Novolog 3 units TID with meals for meal coverage if patient eats at least 50% of meals.  Thanks, Orlando Penner, RN, MSN, CDE Diabetes Coordinator Inpatient Diabetes Program [phone] (Team Pager from 8am to 5pm)

## 2021-03-13 NOTE — Progress Notes (Signed)
RT note. Patient refuse cpap tonight, patient stated she wears oxygen at night time. Patient placed on 3L [place], cpap is in patients room if she decides to change her mind, RT will continue to monitor.

## 2021-03-13 NOTE — Progress Notes (Signed)
Patient HR drop to 37 for few seconds while sleeping, woke patient up HR back to 50-60, pt. Alert and oriented. MD notified see epic for medication adjustment. Will continue to monitor the patient.

## 2021-03-14 LAB — BASIC METABOLIC PANEL
Anion gap: 9 (ref 5–15)
BUN: 56 mg/dL — ABNORMAL HIGH (ref 8–23)
CO2: 26 mmol/L (ref 22–32)
Calcium: 8.6 mg/dL — ABNORMAL LOW (ref 8.9–10.3)
Chloride: 101 mmol/L (ref 98–111)
Creatinine, Ser: 2.64 mg/dL — ABNORMAL HIGH (ref 0.44–1.00)
GFR, Estimated: 20 mL/min — ABNORMAL LOW (ref >60)
Glucose, Bld: 176 mg/dL — ABNORMAL HIGH (ref 70–99)
Potassium: 3.9 mmol/L (ref 3.5–5.1)
Sodium: 136 mmol/L (ref 135–145)

## 2021-03-14 LAB — GLUCOSE, CAPILLARY: Glucose-Capillary: 143 mg/dL — ABNORMAL HIGH (ref 70–99)

## 2021-03-14 LAB — URIC ACID: Uric Acid, Serum: 4.3 mg/dL (ref 2.5–7.1)

## 2021-03-14 MED ORDER — HYDRALAZINE HCL 25 MG PO TABS: 75.0000 mg | ORAL_TABLET | Freq: Three times a day (TID) | ORAL | 0 refills | Status: DC

## 2021-03-14 MED ORDER — ATORVASTATIN CALCIUM 20 MG PO TABS
20.0000 mg | ORAL_TABLET | Freq: Every day | ORAL | 0 refills | Status: DC
Start: 2021-03-14 — End: 2021-03-18

## 2021-03-14 MED ORDER — CARVEDILOL 6.25 MG PO TABS
6.2500 mg | ORAL_TABLET | Freq: Two times a day (BID) | ORAL | 0 refills | Status: DC
Start: 2021-03-14 — End: 2021-05-22

## 2021-03-14 MED ORDER — FUROSEMIDE 40 MG PO TABS: 40.0000 mg | ORAL_TABLET | ORAL | 0 refills | Status: DC | PRN

## 2021-03-14 NOTE — Progress Notes (Signed)
Heart Failure Navigator Progress Note  Assessed for Heart & Vascular TOC clinic readiness.  Patient does not meet criteria: [facility] f/u scheduled 8/23  Navigator available for reassessment of patient.   Ozella Rocks, MSN, RN Heart Failure Nurse Navigator [phone]

## 2021-03-14 NOTE — Discharge Summary (Signed)
Discharge Summary  Lori Mclaughlin MRN:[id number] DOB: 10/15/1960  PCP: Murray, Laura Woodruff, FNP  Admit date: 03/07/2021 Discharge date: 03/14/2021  Time spent: 45mins, more than 50% time spent on coordination of care.   Recommendations for Outpatient Follow-up:  F/u with PCP on 8/23 for hospital discharge follow up, repeat cbc/bmp at follow up F/u with cardiology on 8/23 F/u with endocrinology Dr Sean Ellison next week   Discharge Diagnoses:  Active Hospital Problems   Diagnosis Date Noted   Acute on chronic diastolic CHF (congestive heart failure) (HCC) 03/07/2021   Hypertensive emergency 03/08/2021   Chronic kidney disease (CKD) stage G3b/A1, moderately decreased glomerular filtration rate (GFR) between 30-44 mL/min/1.73 square meter and albuminuria creatinine ratio less than 30 mg/g (HCC) 03/08/2021   Morbid obesity (HCC) 04/11/2014   Type II diabetes mellitus with renal manifestations, uncontrolled (HCC) 08/05/2009    Resolved Hospital Problems  No resolved problems to display.    Discharge Condition: stable  Diet recommendation: heart healthy/carb modified  Filed Weights   03/12/21 0537 03/13/21 0144 03/14/21 0344  Weight: 122 kg 123 kg 123.6 kg    History of present illness:  History of hypertension, insulin-dependent type 2 diabetes, diastolic CHF presented with short of breath, increased edema, found to be in acute diastolic CHF exacerbation  Hospital Course:  Principal Problem:   Acute on chronic diastolic CHF (congestive heart failure) (HCC) Active Problems:   Type II diabetes mellitus with renal manifestations, uncontrolled (HCC)   Morbid obesity (HCC)   Hypertensive emergency   Chronic kidney disease (CKD) stage G3b/A1, moderately decreased glomerular filtration rate (GFR) between 30-44 mL/min/1.73 square meter and albuminuria creatinine ratio less than 30 mg/g (HCC)  Acute on chronic diastolic chf Edema, short of breath have resolved with IV  diuresis 60mg tid x3 doses ,then lasix 60mg bid x2 doses, then Diuresis on hold due to elevation of creatinine, cr peaked at 2.8, start to trend down She does not appear to take diuretic at home prior to admission, plan to  discharge her home on Lasix q72hrs prn for edema and weight gain, she is advised to check her weight daily and bring record to hospital discharge follow up -she may need further diuretic adjustment,  to be determined at hospital discharge follow up   AKI on CKDIIIb -Creatinine on presentation was 1.7, cr peaked at 2.8, start to trend down -renal ultrasound no obstruction -she was treated with avapro and iv lasix initially, both were held, cr start to trend down -she is not on ARBs at home, will not start at discharge -need to repeat bmp at hospital discharge follow up, pcp to refer to nephrology   HTN Bp 208/104 initially on presentation She was put on nitroglycerin patch on admission, this was discontinued  She was started on avapro on admission , this was discontinued due to cr elevation She reports not able to tolerated high dose hydralazine ( 75mg ) due to gi side effect She recently is started on cardura but has not started to take yet She is discharged on norvasc, hydralazine ( 50mg ), cardura, prn lasix, coreg dose reduced due to bradycardia She has a h/o liniopril allergy, she is not on ARB at home either She is advised to check blood pressure at home and bring in record to hospital discharge follow up  She has an appointment with Dr. Morganfield on 8/23 Report had outpatient sleep study, in the process of getting CPAP   Sinus bradycardia -low 50's when awake, dropped   to 37 briefly when sleep, back to baseline when awake -tsh unremarkable -decrease coreg, she reports in the process of getting cpap -she is advised to follow up with cardiology next week   Insulin-dependent type 2 diabetes Uncontrolled, A1c 9% Appear to be on Toujeo 70 units every morning at  home, Kombiglyze XR [phone] mg daily -Has been on Semglee 35 units daily, a.m. blood glucose 67, decrease to 33 daily -she states she use continuous blood glucose monitoring device at home , she has no issues of hypoglycemia at home on home insulin regimen, I encourage her to adhere to carb modified diet at home -she is discharged home on home dose insulin, she states has an appointment with endocrinology Dr Renato Shin next week     Class III obesity: Body mass index is 52.95 kg/m.[name] Encourage lift style modification for weight loss OSA , just has sleep study, awaiting cpap arrangement ( reported on back order) F/u with pulmonology   History of gout, no active flare, hold  allopurinol due to elevated cr   Left upper extremity edema, venous Doppler negative for DVT, elevate left arm, resolved     Consultants:  None   Procedures:  None   Antimicrobials:     None    Discharge Exam: BP 134/73 (BP Location: Right Wrist)   Pulse (!) 47   Temp 98.2 F (36.8 C) (Oral)   Resp 16   Ht 5' (1.524 m)   Wt 123.6 kg   LMP 02/23/2012   SpO2 98%   BMI 53.20 kg/m   General: NAD Cardiovascular: RRR, no edema Respiratory: normal respiratory effort  Discharge Instructions You were cared for by a hospitalist during your hospital stay. If you have any questions about your discharge medications or the care you received while you were in the hospital after you are discharged, you can call the unit and asked to speak with the hospitalist on call if the hospitalist that took care of you is not available. Once you are discharged, your primary care physician will handle any further medical issues. Please note that NO REFILLS for any discharge medications will be authorized once you are discharged, as it is imperative that you return to your primary care physician (or establish a relationship with a primary care physician if you do not have one) for your aftercare needs so that they can reassess  your need for medications and monitor your lab values.  Discharge Instructions     Diet - low sodium heart healthy   Complete by: As directed    Carb modified diet   Increase activity slowly   Complete by: As directed       Allergies as of 03/14/2021       Reactions   Lisinopril Other (See Comments)   Headache   Latex Itching, Rash        Medication List     STOP taking these medications    chlorthalidone 50 MG tablet Commonly known as: HYGROTON   HYDROcodone-acetaminophen 5-325 MG tablet Commonly known as: Norco       TAKE these medications    Accu-Chek Guide w/Device Kit 1 each by Does not apply route 2 (two) times daily. Use to monitor blood glucose level twice per day   accu-chek soft touch lancets Use to monitor blood glucose level twice per day   allopurinol 300 MG tablet Commonly known as: ZYLOPRIM TAKE 1 TABLET BY MOUTH  DAILY   amLODipine 10 MG tablet Commonly known as:  NORVASC Take 1 tablet (10 mg total) by mouth daily.   aspirin 81 MG EC tablet Take 1 tablet (81 mg total) by mouth daily.   atorvastatin 20 MG tablet Commonly known as: LIPITOR Take 1 tablet (20 mg total) by mouth at bedtime.   carvedilol 6.25 MG tablet Commonly known as: COREG Take 1 tablet (6.25 mg total) by mouth 2 (two) times daily with a meal. What changed:  medication strength how much to take   doxazosin 1 MG tablet Commonly known as: Cardura Take 1 tablet (1 mg total) by mouth at bedtime.   ELDERBERRY PO Take 1 Dose by mouth daily.   escitalopram 10 MG tablet Commonly known as: Lexapro Take 1 tablet (10 mg total) by mouth daily.   FreeStyle Libre 2 Reader Devi USE ONCE AS DIRECTED   FreeStyle Libre 2 Sensor Misc 1 Device by Does not apply route every 14 (fourteen) days.   furosemide 40 MG tablet Commonly known as: Lasix Take 1 tablet (40 mg total) by mouth every three (3) days as needed for fluid or edema.   glucose blood test strip Use to monitor  blood glucose level twice per day   hydrALAZINE 50 MG tablet Commonly known as: APRESOLINE Take 1 tablet (50 mg total) by mouth 3 (three) times daily.   Kombiglyze XR 2.11-998 MG Tb24 Generic drug: Saxagliptin-Metformin TAKE 2 TABLETS BY MOUTH IN  THE MORNING What changed: See the new instructions.   Toujeo Max SoloStar 300 UNIT/ML Solostar Pen Generic drug: insulin glargine (2 Unit Dial) INJECT 70 UNITS INTO THE  SKIN IN THE MORNING What changed: See the new instructions.       Allergies  Allergen Reactions   Lisinopril Other (See Comments)    Headache   Latex Itching and Rash    Follow-up Information     Murray, Laura Woodruff, FNP. Go on 03/18/2021.   Specialty: Internal Medicine Why: @11:20am Contact information: [address] [place] [address] [phone]         Turner, Traci R, MD .   Specialty: Cardiology Contact information: [address] [place] [address] [phone]                  The results of significant diagnostics from this hospitalization (including imaging, microbiology, ancillary and laboratory) are listed below for reference.    Significant Diagnostic Studies: DG Chest 2 View  Result Date: 03/07/2021 CLINICAL DATA:  Shortness of breath and wheezing over the last week. EXAM: CHEST - 2 VIEW COMPARISON:  08/24/2017 FINDINGS: The cardiac silhouette is enlarged, consistent with cardiomegaly or pericardial fluid. There is pulmonary venous hypertension, possibly with mild interstitial edema. No visible effusion. No consolidation or collapse. Question 14 mm nodule in the lateral right upper lobe. No acute bone finding. IMPRESSION: Newly seen enlargement of the cardiac silhouette with a pattern that could be due to cardiomegaly and/or pericardial fluid. Pulmonary venous hypertension, possibly with early interstitial edema. Question 14 mm lung nodule in the right upper lateral chest. Follow-up recommended either with  additional short-term radiography or chest CT. Electronically Signed   By: Mark  Shogry M.D.   On: 03/07/2021 17:40   US RENAL  Result Date: 03/13/2021 CLINICAL DATA:  Elevated creatinine EXAM: RENAL / URINARY TRACT ULTRASOUND COMPLETE COMPARISON:  None. FINDINGS: Suboptimal detail on both sides due to narrow sonographic windows and soft tissue attenuation. Right Kidney: Renal measurements: 10 x 5 x 6 cm = volume: 150 mL. Echogenicity within   normal limits. No mass or hydronephrosis visualized. Left Kidney: Renal measurements: 10 x 6 x 5 cm = volume: 160 mL. Echogenicity within normal limits. No mass or hydronephrosis visualized. Bladder: Appears normal for degree of bladder distention. IMPRESSION: Normal renal ultrasound. Electronically Signed   By: Jonathon  Watts M.D.   On: 03/13/2021 08:53   DG Chest Port 1 View  Result Date: 03/10/2021 CLINICAL DATA:  Reason for exam: follow up Patient denies any sob or chest pains. Patient reports she originally came to hospital for sob. Hx of asthma, chf, diabetes, htn. EXAM: PORTABLE CHEST - 1 VIEW COMPARISON:  03/07/2021 FINDINGS: Relatively low lung volumes with crowding of bronchovascular structures. Stable mild cardiomegaly. No effusion.  No pneumothorax. Visualized bones unremarkable. IMPRESSION: Stable cardiomegaly.  No acute findings. Electronically Signed   By: D  Hassell M.D.   On: 03/10/2021 08:30   ECHOCARDIOGRAM COMPLETE  Result Date: 03/08/2021    ECHOCARDIOGRAM REPORT   Patient Name:   Lori Mclaughlin Date of Exam: 03/08/2021 Medical Rec #:  [id number]              Height:       60.0 in Accession #:    [id number]             Weight:       282.8 lb Date of Birth:  03/09/1961              BSA:          2.163 m Patient Age:    59 years               BP:           162/69 mmHg Patient Gender: F                      HR:           63 bpm. Exam Location:  Inpatient Procedure: 2D Echo Indications:    congestive heart failure  History:        Patient has  prior history of Echocardiogram examinations, most                 recent 03/30/2014. CHF, chronic kidney disease,                 Signs/Symptoms:Shortness of Breath; Risk Factors:Sleep Apnea,                 Hypertension, Dyslipidemia and Diabetes.  Sonographer:    Lauren Pennington RDCS Referring Phys: [id number] BRADLEY S CHOTINER  Sonographer Comments: Image acquisition challenging due to patient body habitus. IMPRESSIONS  1. Left ventricular ejection fraction, by estimation, is 60 to 65%. The left ventricle has normal function. The left ventricle has no regional wall motion abnormalities. There is mild concentric left ventricular hypertrophy. Left ventricular diastolic parameters are consistent with Grade II diastolic dysfunction (pseudonormalization).  2. Right ventricular systolic function is normal. The right ventricular size is normal. Tricuspid regurgitation signal is inadequate for assessing PA pressure.  3. The mitral valve is grossly normal. Trivial mitral valve regurgitation. No evidence of mitral stenosis.  4. The aortic valve is tricuspid. Aortic valve regurgitation is not visualized. No aortic stenosis is present.  5. The inferior vena cava is dilated in size with >50% respiratory variability, suggesting right atrial pressure of 8 mmHg. FINDINGS  Left Ventricle: Left ventricular ejection fraction, by estimation, is 60 to 65%. The left ventricle has normal function. The left ventricle has   no regional wall motion abnormalities. The left ventricular internal cavity size was normal in size. There is  mild concentric left ventricular hypertrophy. Left ventricular diastolic parameters are consistent with Grade II diastolic dysfunction (pseudonormalization). Right Ventricle: The right ventricular size is normal. No increase in right ventricular wall thickness. Right ventricular systolic function is normal. Tricuspid regurgitation signal is inadequate for assessing PA pressure. Left Atrium: Left atrial size was  normal in size. Right Atrium: Right atrial size was normal in size. Pericardium: Trivial pericardial effusion is present. Presence of pericardial fat pad. Mitral Valve: The mitral valve is grossly normal. Trivial mitral valve regurgitation. No evidence of mitral valve stenosis. Tricuspid Valve: The tricuspid valve is grossly normal. Tricuspid valve regurgitation is not demonstrated. No evidence of tricuspid stenosis. Aortic Valve: The aortic valve is tricuspid. Aortic valve regurgitation is not visualized. No aortic stenosis is present. Pulmonic Valve: The pulmonic valve was grossly normal. Pulmonic valve regurgitation is trivial. No evidence of pulmonic stenosis. Aorta: The aortic root and ascending aorta are structurally normal, with no evidence of dilitation. Venous: The inferior vena cava is dilated in size with greater than 50% respiratory variability, suggesting right atrial pressure of 8 mmHg. IAS/Shunts: The atrial septum is grossly normal.  LEFT VENTRICLE PLAX 2D LVIDd:         5.10 cm  Diastology LVIDs:         3.30 cm  LV e' medial:    5.55 cm/s LV PW:         1.50 cm  LV E/e' medial:  19.8 LV IVS:        1.20 cm  LV e' lateral:   6.31 cm/s LVOT diam:     1.90 cm  LV E/e' lateral: 17.4 LV SV:         67 LV SV Index:   31 LVOT Area:     2.84 cm  RIGHT VENTRICLE RV S prime:     12.40 cm/s TAPSE (M-mode): 2.0 cm LEFT ATRIUM             Index       RIGHT ATRIUM           Index LA diam:        4.90 cm 2.27 cm/m  RA Area:     16.30 cm LA Vol (A2C):   92.1 ml 42.58 ml/m RA Volume:   43.80 ml  20.25 ml/m LA Vol (A4C):   55.2 ml 25.52 ml/m LA Biplane Vol: 71.4 ml 33.01 ml/m  AORTIC VALVE LVOT Vmax:   113.00 cm/s LVOT Vmean:  72.300 cm/s LVOT VTI:    0.238 m  AORTA Ao Root diam: 2.80 cm Ao Asc diam:  3.40 cm MITRAL VALVE MV Area (PHT): 3.68 cm     SHUNTS MV Decel Time: 206 msec     Systemic VTI:  0.24 m MV E velocity: 110.00 cm/s  Systemic Diam: 1.90 cm MV A velocity: 112.00 cm/s MV E/A ratio:  0.98 Rodeo  O'Neal MD Electronically signed by [place] O'Neal MD Signature Date/Time: 03/08/2021/2:49:39 PM    Final    VAS US UPPER EXTREMITY VENOUS DUPLEX  Result Date: 03/12/2021 UPPER VENOUS STUDY  Patient Name:  Lori Mclaughlin  Date of Exam:   03/12/2021 Medical Rec #: [id number]               Accession #:    [id number] Date of Birth: 04/18/1961                 Patient Gender: F Patient Age:   59 years Exam Location:  [facility] Procedure:      VAS US UPPER EXTREMITY VENOUS DUPLEX Referring Phys: FANG XU --------------------------------------------------------------------------------  Indications: Edema Limitations: Body habitus. Comparison Study: no prior Performing Technologist: Megan Stricklin RVS  Examination Guidelines: A complete evaluation includes B-mode imaging, spectral Doppler, color Doppler, and power Doppler as needed of all accessible portions of each vessel. Bilateral testing is considered an integral part of a complete examination. Limited examinations for reoccurring indications may be performed as noted.  Right Findings: +----------+------------+---------+-----------+----------+-------+ RIGHT     CompressiblePhasicitySpontaneousPropertiesSummary +----------+------------+---------+-----------+----------+-------+ Subclavian               Yes       Yes                      +----------+------------+---------+-----------+----------+-------+  Left Findings: +----------+------------+---------+-----------+----------+-------+ LEFT      CompressiblePhasicitySpontaneousPropertiesSummary +----------+------------+---------+-----------+----------+-------+ IJV           Full       Yes       Yes                      +----------+------------+---------+-----------+----------+-------+ Subclavian    Full       Yes       Yes                      +----------+------------+---------+-----------+----------+-------+ Axillary      Full       Yes       Yes                       +----------+------------+---------+-----------+----------+-------+ Brachial      Full       Yes       Yes                      +----------+------------+---------+-----------+----------+-------+ Radial        Full                                          +----------+------------+---------+-----------+----------+-------+ Ulnar         Full                                          +----------+------------+---------+-----------+----------+-------+ Cephalic      Full                                          +----------+------------+---------+-----------+----------+-------+ Basilic       Full                                          +----------+------------+---------+-----------+----------+-------+  Summary:  Right: No evidence of thrombosis in the subclavian.  Left: No evidence of deep vein thrombosis in the upper extremity. No evidence of superficial vein thrombosis in the upper extremity.  *See table(s) above for measurements and observations.  Diagnosing physician: Vance Brabham MD Electronically signed by Vance Brabham MD on 03/12/2021 at 10:39:02 PM.      Final     Microbiology: Recent Results (from the past 240 hour(s))  Resp Panel by RT-PCR (Flu A&B, Covid) Nasopharyngeal Swab     Status: None   Collection Time: 03/07/21  8:41 PM   Specimen: Nasopharyngeal Swab; Nasopharyngeal(NP) swabs in vial transport medium  Result Value Ref Range Status   SARS Coronavirus 2 by RT PCR NEGATIVE NEGATIVE Final    Comment: (NOTE) SARS-CoV-2 target nucleic acids are NOT DETECTED.  The SARS-CoV-2 RNA is generally detectable in upper respiratory specimens during the acute phase of infection. The lowest concentration of SARS-CoV-2 viral copies this assay can detect is 138 copies/mL. A negative result does not preclude SARS-Cov-2 infection and should not be used as the sole basis for treatment or other patient management decisions. A negative result may occur with  improper specimen  collection/handling, submission of specimen other than nasopharyngeal swab, presence of viral mutation(s) within the areas targeted by this assay, and inadequate number of viral copies(<138 copies/mL). A negative result must be combined with clinical observations, patient history, and epidemiological information. The expected result is Negative.  Fact Sheet for Patients:  EntrepreneurPulse.com.au  Fact Sheet for Healthcare Providers:  IncredibleEmployment.be  This test is no t yet approved or cleared by the [place] FDA and  has been authorized for detection and/or diagnosis of SARS-CoV-2 by FDA under an Emergency Use Authorization (EUA). This EUA will remain  in effect (meaning this test can be used) for the duration of the COVID-19 declaration under Section 564(b)(1) of the Act, 21 U.S.C.section 360bbb-3(b)(1), unless the authorization is terminated  or revoked sooner.       Influenza A by PCR NEGATIVE NEGATIVE Final   Influenza B by PCR NEGATIVE NEGATIVE Final    Comment: (NOTE) The Xpert Xpress SARS-CoV-2/FLU/RSV plus assay is intended as an aid in the diagnosis of influenza from Nasopharyngeal swab specimens and should not be used as a sole basis for treatment. Nasal washings and aspirates are unacceptable for Xpert Xpress SARS-CoV-2/FLU/RSV testing.  Fact Sheet for Patients: EntrepreneurPulse.com.au  Fact Sheet for Healthcare Providers: IncredibleEmployment.be  This test is not yet approved or cleared by the [place] FDA and has been authorized for detection and/or diagnosis of SARS-CoV-2 by FDA under an Emergency Use Authorization (EUA). This EUA will remain in effect (meaning this test can be used) for the duration of the COVID-19 declaration under Section 564(b)(1) of the Act, 21 U.S.C. section 360bbb-3(b)(1), unless the authorization is terminated or revoked.  Performed at [facility], [address] [place], [place] [address]      Labs: Basic Metabolic Panel: Recent Labs  Lab 03/07/21 1738 03/08/21 0326 03/10/21 0836 03/11/21 0406 03/12/21 0251 03/13/21 0119 03/14/21 0337  NA 138   < > 140 141 140 137 136  K 4.3   < > 3.4* 3.6 3.6 3.7 3.9  CL 102   < > 102 103 104 100 101  CO2 26   < > _0 GLUCOSE 243*   < > 86 107* 67* 240* 176*  BUN 23   < > 29* 35* 41* 50* 56*  CREATININE 1.79*   < > 2.45* 2.64* 2.71* 2.82* 2.64*  CALCIUM 8.8*   < > 8.4* 8.7* 8.6* 8.5* 8.6*  MG 1.6*  --   --   --   --   --   --    < > = values in this interval not displayed.   Liver Function Tests:  Recent Labs  Lab 03/07/21 1738  AST 24  ALT 19  ALKPHOS 77  BILITOT 0.2*  PROT 7.7  ALBUMIN 3.6   No results for input(s): LIPASE, AMYLASE in the last 168 hours. No results for input(s): AMMONIA in the last 168 hours. CBC: Recent Labs  Lab 03/07/21 1738 03/08/21 0326  WBC 7.6 8.3  HGB 9.3* 8.2*  HCT 30.2* 26.2*  MCV 77.8* 76.8*  PLT 278 257   Cardiac Enzymes: No results for input(s): CKTOTAL, CKMB, CKMBINDEX, TROPONINI in the last 168 hours. BNP: BNP (last 3 results) Recent Labs    03/07/21 1738  BNP 631.5*    ProBNP (last 3 results) No results for input(s): PROBNP in the last 8760 hours.  CBG: Recent Labs  Lab 03/13/21 0601 03/13/21 1159 03/13/21 1603 03/13/21 2137 03/14/21 0601  GLUCAP 199* 209* 191* 189* 143*       Signed:  Fang Xu MD, PhD, FACP  [facility] 03/14/2021, 10:34 AM    

## 2021-03-14 NOTE — Progress Notes (Signed)
Discharge education and medication education given to patient and daughter at bedside with teach back. Education on low sodium heart healthy diet, increasing activity slowly, and when to call MD given. All questions and concerns answered. Peripheral IV and telemetry leads removed. All patient belongings given to patient. Patient transported to main entrance by nurse tech via wheelchair.

## 2021-03-17 ENCOUNTER — Encounter (HOSPITAL_BASED_OUTPATIENT_CLINIC_OR_DEPARTMENT_OTHER): Payer: 59 | Admitting: Cardiology

## 2021-03-17 ENCOUNTER — Telehealth: Payer: Self-pay

## 2021-03-17 NOTE — Telephone Encounter (Signed)
Transition Care Management Follow-up Telephone Call Date of discharge and from where: 03/14/[facility] How have you been since you were released from the hospital? Doing ok, Feeling groggy Any questions or concerns? No  Items Reviewed: Did the pt receive and understand the discharge instructions provided? Yes  Medications obtained and verified? Yes  Other? Yes  Any new allergies since your discharge? No  Dietary orders reviewed? Yes Do you have support at home? Yes   Home Care and Equipment/Supplies: Were home health services ordered? no If so, what is the name of the agency? N/a  Has the agency set up a time to come to the patient's home? not applicable Were any new equipment or medical supplies ordered?  No What is the name of the medical supply agency? N/a Were you able to get the supplies/equipment? not applicable Do you have any questions related to the use of the equipment or supplies? N/a  Functional Questionnaire: (I = Independent and D = Dependent) ADLs: I  Bathing/Dressing- I  Meal Prep- D  Eating- I  Maintaining continence- I  Transferring/Ambulation- I  Managing Meds- D  Follow up appointments reviewed:  PCP Hospital f/u appt confirmed? Yes  Scheduled to see Ria Clock on 03/18/2021  @ 11:20. Specialist Hospital f/u appt confirmed? Yes  Scheduled to see Chilton Si on 03/18/2021 @ 4:00. Are transportation arrangements needed? No  If their condition worsens, is the pt aware to call PCP or go to the Emergency Dept.? Yes Was the patient provided with contact information for the PCP's office or ED? Yes Was to pt encouraged to call back with questions or concerns? Yes

## 2021-03-18 ENCOUNTER — Ambulatory Visit (INDEPENDENT_AMBULATORY_CARE_PROVIDER_SITE_OTHER): Payer: 59 | Admitting: Family

## 2021-03-18 ENCOUNTER — Encounter (HOSPITAL_BASED_OUTPATIENT_CLINIC_OR_DEPARTMENT_OTHER): Payer: Self-pay | Admitting: Cardiovascular Disease

## 2021-03-18 ENCOUNTER — Other Ambulatory Visit: Payer: Self-pay

## 2021-03-18 ENCOUNTER — Ambulatory Visit (INDEPENDENT_AMBULATORY_CARE_PROVIDER_SITE_OTHER): Payer: 59 | Admitting: Cardiovascular Disease

## 2021-03-18 VITALS — BP 185/77 | HR 53 | Ht 60.0 in | Wt 272.0 lb

## 2021-03-18 VITALS — BP 130/80 | HR 55 | Temp 97.9°F

## 2021-03-18 DIAGNOSIS — G4730 Sleep apnea, unspecified: Secondary | ICD-10-CM

## 2021-03-18 DIAGNOSIS — I5033 Acute on chronic diastolic (congestive) heart failure: Secondary | ICD-10-CM

## 2021-03-18 DIAGNOSIS — I5032 Chronic diastolic (congestive) heart failure: Secondary | ICD-10-CM

## 2021-03-18 DIAGNOSIS — I10 Essential (primary) hypertension: Secondary | ICD-10-CM

## 2021-03-18 DIAGNOSIS — E785 Hyperlipidemia, unspecified: Secondary | ICD-10-CM

## 2021-03-18 DIAGNOSIS — IMO0002 Reserved for concepts with insufficient information to code with codable children: Secondary | ICD-10-CM

## 2021-03-18 DIAGNOSIS — E1165 Type 2 diabetes mellitus with hyperglycemia: Secondary | ICD-10-CM

## 2021-03-18 DIAGNOSIS — N183 Chronic kidney disease, stage 3 unspecified: Secondary | ICD-10-CM

## 2021-03-18 DIAGNOSIS — E1129 Type 2 diabetes mellitus with other diabetic kidney complication: Secondary | ICD-10-CM

## 2021-03-18 LAB — COMPREHENSIVE METABOLIC PANEL
ALT: 9 U/L (ref 0–35)
AST: 14 U/L (ref 0–37)
Albumin: 3.5 g/dL (ref 3.5–5.2)
Alkaline Phosphatase: 71 U/L (ref 39–117)
BUN: 63 mg/dL — ABNORMAL HIGH (ref 6–23)
CO2: 25 mEq/L (ref 19–32)
Calcium: 9.2 mg/dL (ref 8.4–10.5)
Chloride: 106 mEq/L (ref 96–112)
Creatinine, Ser: 2.46 mg/dL — ABNORMAL HIGH (ref 0.40–1.20)
GFR: 20.59 mL/min — ABNORMAL LOW (ref >60.00)
Glucose, Bld: 76 mg/dL (ref 70–99)
Potassium: 4.6 mEq/L (ref 3.5–5.1)
Sodium: 141 mEq/L (ref 135–145)
Total Bilirubin: 0.3 mg/dL (ref 0.2–1.2)
Total Protein: 7 g/dL (ref 6.0–8.3)

## 2021-03-18 LAB — CBC WITH DIFFERENTIAL/PLATELET
Basophils Absolute: 0 10*3/uL (ref 0.0–0.1)
Basophils Relative: 0.4 % (ref 0.0–3.0)
Eosinophils Absolute: 0.2 10*3/uL (ref 0.0–0.7)
Eosinophils Relative: 3.3 % (ref 0.0–5.0)
HCT: 28.1 % — ABNORMAL LOW (ref 36.0–46.0)
Hemoglobin: 8.7 g/dL — ABNORMAL LOW (ref 12.0–15.0)
Lymphocytes Relative: 20.4 % (ref 12.0–46.0)
Lymphs Abs: 1.5 10*3/uL (ref 0.7–4.0)
MCHC: 30.9 g/dL (ref 30.0–36.0)
MCV: 76.9 fl — ABNORMAL LOW (ref 78.0–100.0)
Monocytes Absolute: 0.6 10*3/uL (ref 0.1–1.0)
Monocytes Relative: 8 % (ref 3.0–12.0)
Neutro Abs: 5 10*3/uL (ref 1.4–7.7)
Neutrophils Relative %: 67.9 % (ref 43.0–77.0)
Platelets: 238 10*3/uL (ref 150.0–400.0)
RBC: 3.65 Mil/uL — ABNORMAL LOW (ref 3.87–5.11)
RDW: 18.3 % — ABNORMAL HIGH (ref 11.5–15.5)
WBC: 7.3 10*3/uL (ref 4.0–10.5)

## 2021-03-18 MED ORDER — CLONIDINE HCL 0.1 MG PO TABS: 0.1000 mg | ORAL_TABLET | Freq: Two times a day (BID) | ORAL | 3 refills | Status: DC

## 2021-03-18 MED ORDER — ATORVASTATIN CALCIUM 20 MG PO TABS: 20.0000 mg | ORAL_TABLET | Freq: Every day | ORAL | 1 refills | Status: DC

## 2021-03-18 NOTE — Progress Notes (Signed)
Lori Mclaughlin is a 60 y.o. female with the following history as recorded in EpicCare:  Patient Active Problem List   Diagnosis Date Noted   Hypertensive emergency 03/08/2021   Chronic kidney disease (CKD) stage G3b/A1, moderately decreased glomerular filtration rate (GFR) between 30-44 mL/min/1.73 square meter and albuminuria creatinine ratio less than 30 mg/g (HCC) 03/08/2021   Acute on chronic diastolic CHF (congestive heart failure) ([place]) 03/07/2021   Thiamine deficiency 09/19/2017   Myalgia 03/10/2017   Screen for colon cancer 06/12/2015   Visit for screening mammogram 06/12/2015   Hypersomnia 04/11/2014   Morbid obesity ([place]) 04/11/2014   Bradycardia, drug induced 04/11/2014   Chronic diastolic CHF (congestive heart failure), NYHA class 2 ([place]) 03/30/2014   Low back pain radiating to left leg 01/26/2012   Sleep apnea 11/13/2011   Hyperlipidemia with target LDL less than 70 01/14/2011   Type II diabetes mellitus with renal manifestations, uncontrolled ([place]) 08/05/2009   Deficiency anemia 08/05/2009   Essential hypertension, malignant 08/05/2009   ALLERGIC RHINITIS 08/05/2009   ASTHMA 08/05/2009   GERD 08/05/2009   OSTEOARTHRITIS 08/05/2009    Current Outpatient Medications  Medication Sig Dispense Refill   allopurinol (ZYLOPRIM) 300 MG tablet TAKE 1 TABLET BY MOUTH  DAILY (Patient taking differently: Take 300 mg by mouth daily.) 90 tablet 3   aspirin EC 81 MG EC tablet Take 1 tablet (81 mg total) by mouth daily.     Blood Glucose Monitoring Suppl (ACCU-CHEK GUIDE) w/Device KIT 1 each by Does not apply route 2 (two) times daily. Use to monitor blood glucose level twice per day 1 kit 0   carvedilol (COREG) 6.25 MG tablet Take 1 tablet (6.25 mg total) by mouth 2 (two) times daily with a meal. 60 tablet 0   Continuous Blood Gluc Receiver (FREESTYLE LIBRE 2 READER) DEVI USE ONCE AS DIRECTED 1 each 0   Continuous Blood Gluc Sensor (FREESTYLE LIBRE 2 SENSOR) MISC 1 Device by  Does not apply route every 14 (fourteen) days. 6 each 3   doxazosin (CARDURA) 1 MG tablet Take 1 tablet (1 mg total) by mouth at bedtime. 90 tablet 3   escitalopram (LEXAPRO) 10 MG tablet Take 1 tablet (10 mg total) by mouth daily. 90 tablet 0   glucose blood test strip Use to monitor blood glucose level twice per day 100 each 12   hydrALAZINE (APRESOLINE) 50 MG tablet Take 1 tablet (50 mg total) by mouth 3 (three) times daily. 270 tablet 3   KOMBIGLYZE XR 2.11-998 MG TB24 TAKE 2 TABLETS BY MOUTH IN  THE MORNING (Patient taking differently: Take 2 tablets by mouth in the morning.) 180 tablet 0   TOUJEO MAX SOLOSTAR 300 UNIT/ML Solostar Pen INJECT 70 UNITS INTO THE  SKIN IN THE MORNING (Patient taking differently: Inject 70 Units into the skin in the morning.) 24 mL 3   atorvastatin (LIPITOR) 20 MG tablet Take 1 tablet (20 mg total) by mouth at bedtime. 90 tablet 1   furosemide (LASIX) 40 MG tablet Take 1 tablet (40 mg total) by mouth every three (3) days as needed for fluid or edema. (Patient not taking: Reported on 03/18/2021) 30 tablet 0   Lancets (ACCU-CHEK SOFT TOUCH) lancets Use to monitor blood glucose level twice per day 100 each 12   No current facility-administered medications for this visit.    Allergies: Lisinopril and Latex  Past Medical History:  Diagnosis Date   Allergic rhinitis    Asthma    Atypical chest pain  a. 03/2014: normal nuclear stress test.   Chronic diastolic CHF (congestive heart failure) ([place])    a. Dx 03/2014 with echo - moderate LVH, EF 26-41%, grade 1 diastolic dysfunction, mildly dilated LA.   CKD (chronic kidney disease), stage II    Diabetes mellitus type II    GERD (gastroesophageal reflux disease)    Headache(784.0)    when my bp is up   Hypertension    Hypertensive heart disease    Microcytic anemia    OSA (obstructive sleep apnea)    severe with AHI 77/hr with nocturnal hypoxemia   Osteoarthritis     Past Surgical History:  Procedure Laterality  Date   SHOULDER ARTHROSCOPY Bilateral    TUBAL LIGATION      Family History  Problem Relation Age of Onset   Stroke Mother    Cancer Father    Hypertension Brother    Diabetes Other    Hypertension Other    Stroke Other    Cancer Other        colon; prostate   Kidney disease Neg Hx    Alcohol abuse Neg Hx    Asthma Neg Hx    COPD Neg Hx    Depression Neg Hx    Drug abuse Neg Hx    Early death Neg Hx    Hearing loss Neg Hx    Heart disease Neg Hx    Hyperlipidemia Neg Hx     Social History   Tobacco Use   Smoking status: Former    Types: Cigarettes    Quit date: 08/29/2010    Years since quitting: 10.5   Smokeless tobacco: Never  Substance Use Topics   Alcohol use: No    Alcohol/week: 5.0 standard drinks    Types: 5 Glasses of wine per week    Subjective:  Accompanied by daughter; Was recently hospitalized with acute CHF exacerbation/ hypertensive emergency/ CKD; Scheduled to see cardiology later this week and her endocrinologist next week; most recent Hgba1c at 9.1; Discussion of need for referral to nephrology and needing labs updated today;      Objective:  Vitals:   03/18/21 1142  BP: 130/80  Pulse: (!) 55  Temp: 97.9 F (36.6 C)  TempSrc: Oral  SpO2: 97%    General: Well developed, well nourished, in no acute distress  Skin : Warm and dry.  Head: Normocephalic and atraumatic  Eyes: Sclera and conjunctiva clear; pupils round and reactive to light; extraocular movements intact  Ears: External normal; canals clear; tympanic membranes normal  Oropharynx: Pink, supple. No suspicious lesions  Neck: Supple without thyromegaly, adenopathy  Lungs: Respirations unlabored; clear to auscultation bilaterally without wheeze, rales, rhonchi  CVS exam: normal rate and regular rhythm.  Abdomen: Soft; nontender; nondistended; normoactive bowel sounds; no masses or hepatosplenomegaly  Musculoskeletal: No deformities; no active joint inflammation  Extremities: No  edema, cyanosis, clubbing  Vessels: Symmetric bilaterally  Neurologic: Alert and oriented; speech intact; face symmetrical; moves all extremities well; CNII-XII intact without focal deficit  Assessment:  1. Primary hypertension   2. Hyperlipidemia with target LDL less than 70   3. Type II diabetes mellitus with renal manifestations, uncontrolled ([place])   4. Acute on chronic diastolic CHF (congestive heart failure) (HCC)   5. Stage 3 chronic kidney disease, unspecified whether stage 3a or 3b CKD ([place])     Plan:  Extensive medication review done today; keep planned follow up with cardiology for later today; Refill updated on Lipitor 20 mg; Keep  planned follow up with endocrinologist; Will refer to nephrology;  This visit occurred during the SARS-CoV-2 public health emergency.  Safety protocols were in place, including screening questions prior to the visit, additional usage of staff PPE, and extensive cleaning of exam room while observing appropriate contact time as indicated for disinfecting solutions.     No follow-ups on file.  Orders Placed This Encounter  Procedures   CBC with Differential/Platelet   Comp Met (CMET)    Requested Prescriptions   Signed Prescriptions Disp Refills   atorvastatin (LIPITOR) 20 MG tablet 90 tablet 1    Sig: Take 1 tablet (20 mg total) by mouth at bedtime.

## 2021-03-18 NOTE — Patient Instructions (Signed)
Medication Instructions:  STOP DOXAZOSIN   START CLONIDINE 0.1 MG TWICE A DAY    Labwork: NONE   Testing/Procedures: NONE   Follow-Up: 04/24/2021 11:00 AM WITH PHARM D AT [address]    You will receive a phone call from the [facility] exercise and nutrition program to schedule an initial assessment.  Special Instructions:    MONITOR YOUR BLOOD PRESSURE TWICE A DAY, LOG IN THE BOOK PROVIDED. BRING THE BOOK AND YOUR BLOOD PRESSURE MACHINE TO YOUR FOLLOW UP IN 1 MONTH   DASH Eating Plan DASH stands for "Dietary Approaches to Stop Hypertension." The DASH eating plan is a healthy eating plan that has been shown to reduce high blood pressure (hypertension). It may also reduce your risk for type 2 diabetes, heart disease, and stroke. The DASH eating plan may also help with weight loss. What are tips for following this plan?  General guidelines Avoid eating more than 2,300 mg (milligrams) of salt (sodium) a day. If you have hypertension, you may need to reduce your sodium intake to 1,500 mg a day. Limit alcohol intake to no more than 1 drink a day for nonpregnant women and 2 drinks a day for men. One drink equals 12 oz of beer, 5 oz of wine, or 1 oz of hard liquor. Work with your health care provider to maintain a healthy body weight or to lose weight. Ask what an ideal weight is for you. Get at least 30 minutes of exercise that causes your heart to beat faster (aerobic exercise) most days of the week. Activities may include walking, swimming, or biking. Work with your health care provider or diet and nutrition specialist (dietitian) to adjust your eating plan to your individual calorie needs. Reading food labels  Check food labels for the amount of sodium per serving. Choose foods with less than 5 percent of the Daily Value of sodium. Generally, foods with less than 300 mg of sodium per serving fit into this eating plan. To find whole grains, look for the word "whole" as the first  word in the ingredient list. Shopping Buy products labeled as "low-sodium" or "no salt added." Buy fresh foods. Avoid canned foods and premade or frozen meals. Cooking Avoid adding salt when cooking. Use salt-free seasonings or herbs instead of table salt or sea salt. Check with your health care provider or pharmacist before using salt substitutes. Do not fry foods. Cook foods using healthy methods such as baking, boiling, grilling, and broiling instead. Cook with heart-healthy oils, such as olive, canola, soybean, or sunflower oil. Meal planning Eat a balanced diet that includes: 5 or more servings of fruits and vegetables each day. At each meal, try to fill half of your plate with fruits and vegetables. Up to 6-8 servings of whole grains each day. Less than 6 oz of lean meat, poultry, or fish each day. A 3-oz serving of meat is about the same size as a deck of cards. One egg equals 1 oz. 2 servings of low-fat dairy each day. A serving of nuts, seeds, or beans 5 times each week. Heart-healthy fats. Healthy fats called Omega-3 fatty acids are found in foods such as flaxseeds and coldwater fish, like sardines, salmon, and mackerel. Limit how much you eat of the following: Canned or prepackaged foods. Food that is high in trans fat, such as fried foods. Food that is high in saturated fat, such as fatty meat. Sweets, desserts, sugary drinks, and other foods with added sugar. Full-fat dairy  products. Do not salt foods before eating. Try to eat at least 2 vegetarian meals each week. Eat more home-cooked food and less restaurant, buffet, and fast food. When eating at a restaurant, ask that your food be prepared with less salt or no salt, if possible. What foods are recommended? The items listed may not be a complete list. Talk with your dietitian about what dietary choices are best for you. Grains Whole-grain or whole-wheat bread. Whole-grain or whole-wheat pasta. Brown rice. Orpah Cobb.  Bulgur. Whole-grain and low-sodium cereals. Pita bread. Low-fat, low-sodium crackers. Whole-wheat flour tortillas. Vegetables Fresh or frozen vegetables (raw, steamed, roasted, or grilled). Low-sodium or reduced-sodium tomato and vegetable juice. Low-sodium or reduced-sodium tomato sauce and tomato paste. Low-sodium or reduced-sodium canned vegetables. Fruits All fresh, dried, or frozen fruit. Canned fruit in natural juice (without added sugar). Meat and other protein foods Skinless chicken or [place]. Ground chicken or [place]. Pork with fat trimmed off. Fish and seafood. Egg whites. Dried beans, peas, or lentils. Unsalted nuts, nut butters, and seeds. Unsalted canned beans. Lean cuts of beef with fat trimmed off. Low-sodium, lean deli meat. Dairy Low-fat (1%) or fat-free (skim) milk. Fat-free, low-fat, or reduced-fat cheeses. Nonfat, low-sodium ricotta or cottage cheese. Low-fat or nonfat yogurt. Low-fat, low-sodium cheese. Fats and oils Soft margarine without trans fats. Vegetable oil. Low-fat, reduced-fat, or light mayonnaise and salad dressings (reduced-sodium). Canola, safflower, olive, soybean, and sunflower oils. Avocado. Seasoning and other foods Herbs. Spices. Seasoning mixes without salt. Unsalted popcorn and pretzels. Fat-free sweets. What foods are not recommended? The items listed may not be a complete list. Talk with your dietitian about what dietary choices are best for you. Grains Baked goods made with fat, such as croissants, muffins, or some breads. Dry pasta or rice meal packs. Vegetables Creamed or fried vegetables. Vegetables in a cheese sauce. Regular canned vegetables (not low-sodium or reduced-sodium). Regular canned tomato sauce and paste (not low-sodium or reduced-sodium). Regular tomato and vegetable juice (not low-sodium or reduced-sodium). Rosita Fire. Olives. Fruits Canned fruit in a light or heavy syrup. Fried fruit. Fruit in cream or butter sauce. Meat and other  protein foods Fatty cuts of meat. Ribs. Fried meat. Tomasa Blase. Sausage. Bologna and other processed lunch meats. Salami. Fatback. Hotdogs. Bratwurst. Salted nuts and seeds. Canned beans with added salt. Canned or smoked fish. Whole eggs or egg yolks. Chicken or [place] with skin. Dairy Whole or 2% milk, cream, and half-and-half. Whole or full-fat cream cheese. Whole-fat or sweetened yogurt. Full-fat cheese. Nondairy creamers. Whipped toppings. Processed cheese and cheese spreads. Fats and oils Butter. Stick margarine. Lard. Shortening. Ghee. Bacon fat. Tropical oils, such as coconut, palm kernel, or palm oil. Seasoning and other foods Salted popcorn and pretzels. Onion salt, garlic salt, seasoned salt, table salt, and sea salt. Worcestershire sauce. Tartar sauce. Barbecue sauce. Teriyaki sauce. Soy sauce, including reduced-sodium. Steak sauce. Canned and packaged gravies. Fish sauce. Oyster sauce. Cocktail sauce. Horseradish that you find on the shelf. Ketchup. Mustard. Meat flavorings and tenderizers. Bouillon cubes. Hot sauce and Tabasco sauce. Premade or packaged marinades. Premade or packaged taco seasonings. Relishes. Regular salad dressings. Where to find more information: National Heart, Lung, and Blood Institute: PopSte[employer].is American Heart Association: www.heart.org Summary The DASH eating plan is a healthy eating plan that has been shown to reduce high blood pressure (hypertension). It may also reduce your risk for type 2 diabetes, heart disease, and stroke. With the DASH eating plan, you should limit salt (sodium) intake to 2,300 mg a day.  If you have hypertension, you may need to reduce your sodium intake to 1,500 mg a day. When on the DASH eating plan, aim to eat more fresh fruits and vegetables, whole grains, lean proteins, low-fat dairy, and heart-healthy fats. Work with your health care provider or diet and nutrition specialist (dietitian) to adjust your eating plan to your individual  calorie needs. This information is not intended to replace advice given to you by your health care provider. Make sure you discuss any questions you have with your health care provider. Document Released: 07/02/2011 Document Revised: 06/25/2017 Document Reviewed: 07/06/2016 Elsevier Patient Education  2020 [employer].

## 2021-03-18 NOTE — Progress Notes (Signed)
Advanced Hypertension Clinic Initial Assessment:    Date:  04/01/2021   ID:  MECHELE KITTLESON, DOB May 29, 1961, MRN [id number]  PCP:  Marrian Salvage, FNP  Cardiologist:  Fransico Him, MD  Nephrologist:  Referring MD: Marrian Salvage,*   CC: Hypertension  History of Present Illness:    Lori Mclaughlin is a 60 y.o. female with a hx of asthma, chronic diastolic CHF, CKD stage 2, diabetes mellitus type 2 (uncontrolled), GERD, hypertension, microcytic anemia, morbid obesity, OSA with nocturnal hypoxemia, and osteoarthritis, here to establish care in the Advanced Hypertension Clinic. She saw Dr. Radford Pax 01/02/2021 for follow-up. At that visit her blood pressure was 222/99.  On 01/21/2021 she was referred to advanced hypertension clinic by Dr. Radford Pax after Ms. Roselie Awkward reported diarrhea and cramping side effects from 75 mg hydralazine. She was admitted to the hospital 09/2020 for acute on chronic diastolic heart failure and hypertensive emergency. Her blood pressure was 208/94 on admission. She was diuresed with IV Lasix. Her hospitalization was complicated by acute on chronic renal failure. Her ARB was held at discharge. She had recently been prescriped doxazosin but hadn't started it yet. She has known sleep apnea and is in the process of getting a CPAP. She had bradycardia in the 30's while sleeping in the hospital, therefore carvedilol was decreased. She had an Echo on admission with LVEF 60-65% and grade 2 diastolic dysfunction. She wore an ambulatory blood pressure monitor 12/2020 that showed her blood pressure was averaging 180s/80s. 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  Today, she is accompanied by her daughter. She reports having hypertension since she was 60 yo. It has always been challenging to control. For activity, she does not participate in much formal exercise. Friday night she began taking doxazosin,  and subsequently noticed feeling very "weak and wobbly" along with slurred speech. Sunday afternoon she felt dizzy as well. She confirms taking carvedilol, and has not needed to take Lasix since her ED visit. Since she has been home from the hospital, she has been eating home-cooked meals and avoiding salt. She denies feeling any pain after she eats. Usually she has one diet cheerwine or coke with dinner, and water otherwise. She does not drink alcohol or smoke. For pain management she takes tylenol. She does snore and her daughter has noticed it seems like she has PND. Recently she was referred to a nephrologist. She denies any palpitations, chest pain, or shortness of breath. No headaches, syncope, or orthopnea. Also has no lower extremity edema or exertional symptoms.  Previous antihypertensives: Lisinopril Clonidine Doxazosin- wobbly   Past Medical History:  Diagnosis Date   Allergic rhinitis    Asthma    Atypical chest pain    a. 03/2014: normal nuclear stress test.   Chronic diastolic CHF (congestive heart failure) ([place])    a. Dx 03/2014 with echo - moderate LVH, EF 45-14%, grade 1 diastolic dysfunction, mildly dilated LA.   CKD (chronic kidney disease), stage II    Diabetes mellitus type II    GERD (gastroesophageal reflux disease)    Headache(784.0)    when my bp is up   Hypertension    Hypertensive heart disease    Microcytic anemia    OSA (obstructive sleep apnea)    severe with AHI 77/hr with nocturnal hypoxemia   Osteoarthritis     Past Surgical History:  Procedure Laterality Date   SHOULDER ARTHROSCOPY Bilateral    TUBAL  LIGATION      Current Medications: Current Meds  Medication Sig   allopurinol (ZYLOPRIM) 300 MG tablet TAKE 1 TABLET BY MOUTH  DAILY (Patient taking differently: Take 300 mg by mouth daily.)   aspirin EC 81 MG EC tablet Take 1 tablet (81 mg total) by mouth daily.   atorvastatin (LIPITOR) 20 MG tablet Take 1 tablet (20 mg total) by mouth at bedtime.    Blood Glucose Monitoring Suppl (ACCU-CHEK GUIDE) w/Device KIT 1 each by Does not apply route 2 (two) times daily. Use to monitor blood glucose level twice per day   carvedilol (COREG) 6.25 MG tablet Take 1 tablet (6.25 mg total) by mouth 2 (two) times daily with a meal.   Continuous Blood Gluc Receiver (FREESTYLE LIBRE 2 READER) DEVI USE ONCE AS DIRECTED   Continuous Blood Gluc Sensor (FREESTYLE LIBRE 2 SENSOR) MISC 1 Device by Does not apply route every 14 (fourteen) days.   escitalopram (LEXAPRO) 10 MG tablet Take 1 tablet (10 mg total) by mouth daily.   glucose blood test strip Use to monitor blood glucose level twice per day   hydrALAZINE (APRESOLINE) 50 MG tablet Take 1 tablet (50 mg total) by mouth 3 (three) times daily.   KOMBIGLYZE XR 2.11-998 MG TB24 TAKE 2 TABLETS BY MOUTH IN  THE MORNING (Patient taking differently: Take 2 tablets by mouth in the morning.)   Lancets (ACCU-CHEK SOFT TOUCH) lancets Use to monitor blood glucose level twice per day   TOUJEO MAX SOLOSTAR 300 UNIT/ML Solostar Pen INJECT 70 UNITS INTO THE  SKIN IN THE MORNING (Patient taking differently: Inject 70 Units into the skin in the morning.)   [DISCONTINUED] cloNIDine (CATAPRES) 0.1 MG tablet Take 1 tablet (0.1 mg total) by mouth 2 (two) times daily.   [DISCONTINUED] doxazosin (CARDURA) 1 MG tablet Take 1 tablet (1 mg total) by mouth at bedtime.     Allergies:   Lisinopril and Latex   Social History   Socioeconomic History   Marital status: Divorced    Spouse name: Not on file   Number of children: Not on file   Years of education: Not on file   Highest education level: Not on file  Occupational History   Not on file  Tobacco Use   Smoking status: Former    Types: Cigarettes    Quit date: 08/29/2010    Years since quitting: 10.5   Smokeless tobacco: Never  Vaping Use   Vaping Use: Never used  Substance and Sexual Activity   Alcohol use: No    Alcohol/week: 5.0 standard drinks    Types: 5 Glasses of  wine per week   Drug use: No   Sexual activity: Yes    Birth control/protection: Surgical  Other Topics Concern   Not on file  Social History Narrative   Regular Exercise- yes   Social Determinants of Health   Financial Resource Strain: Not on file  Food Insecurity: Not on file  Transportation Needs: Not on file  Physical Activity: Not on file  Stress: Not on file  Social Connections: Not on file     Family History: The patient's family history includes Cancer in her father and another family member; Diabetes in an other family member; Heart attack in her mother; Hypertension in her brother, maternal grandfather, maternal grandmother, mother, and another family member; Stroke in an other family member. There is no history of Kidney disease, Alcohol abuse, Asthma, COPD, Depression, Drug abuse, Early death, Hearing loss, Heart disease, or  Hyperlipidemia.  ROS:   Please see the history of present illness.    (+) Snores (+) PND (+) Generalized weakness and imbalance (+) Slurred speech (+) Dizziness All other systems reviewed and are negative.  EKGs/Labs/Other Studies Reviewed:    Echo 03/08/2021: 1. Left ventricular ejection fraction, by estimation, is 60 to 65%. The  left ventricle has normal function. The left ventricle has no regional  wall motion abnormalities. There is mild concentric left ventricular  hypertrophy. Left ventricular diastolic  parameters are consistent with Grade II diastolic dysfunction  (pseudonormalization).   2. Right ventricular systolic function is normal. The right ventricular  size is normal. Tricuspid regurgitation signal is inadequate for assessing  PA pressure.   3. The mitral valve is grossly normal. Trivial mitral valve  regurgitation. No evidence of mitral stenosis.   4. The aortic valve is tricuspid. Aortic valve regurgitation is not  visualized. No aortic stenosis is present.   5. The inferior vena cava is dilated in size with >50%  respiratory  variability, suggesting right atrial pressure of 8 mmHg.   CAR 24HR Blood Pressure Monitor 01/06/2021: Overall BP average 187/60mHg Awake BP average 188/865mg Asleep BP average 183/8170m 100% of SBP>140m78mwhile awake and > 120mm11mhile asleep 51% of DBP >90mmH59mile awake and > 80mmHg30mle asleep.  EKG:   03/18/2021: EKG is not ordered today.   Recent Labs: 03/07/2021: B Natriuretic Peptide 631.5; Magnesium 1.6 03/08/2021: TSH 2.514 03/18/2021: ALT 9; BUN 63; Creatinine, Ser 2.46; Hemoglobin 8.7; Platelets 238.0; Potassium 4.6; Sodium 141   Recent Lipid Panel    Component Value Date/Time   CHOL 137 03/08/2021 0326   TRIG 68 03/08/2021 0326   HDL 48 03/08/2021 0326   CHOLHDL 2.9 03/08/2021 0326   VLDL 14 03/08/2021 0326   LDLCALC 75 03/08/2021 0326    Physical Exam:    VS:  BP (!) 185/77 (BP Location: Right Arm, Patient Position: Sitting)   Pulse (!) 53   Ht 5' (1.524 m)   Wt 272 lb (123.4 kg)   LMP 02/23/2012   SpO2 99%   BMI 53.12 kg/m  , BMI Body mass index is 53.12 kg/m. GENERAL:  Well appearing HEENT: Pupils equal round and reactive, fundi not visualized, oral mucosa unremarkable NECK:  No jugular venous distention, waveform within normal limits, carotid upstroke brisk and symmetric, no bruits LUNGS:  Clear to auscultation bilaterally HEART:  RRR.  PMI not displaced or sustained,S1 and S2 within normal limits, no S3, no S4, no clicks, no rubs, no murmurs ABD:  Flat, positive bowel sounds normal in frequency in pitch, no bruits, no rebound, no guarding, no midline pulsatile mass, no hepatomegaly, no splenomegaly EXT:  2 plus pulses throughout, no edema, no cyanosis no clubbing SKIN:  No rashes no nodules NEURO:  Cranial nerves II through XII grossly intact, motor grossly intact throughout PSYCH:  Cognitively intact, oriented to person place and time   ASSESSMENT/PLAN:    Essential hypertension, malignant Ms. GilliamAlan Ripperpressure  remains very poorly controlled.  She has not tolerated doxazosin.  We will try clonidine 0.1 mg twice daily.  Continue carvedilol and hydralazine.  She tolerates 50 mg but not 75 mg of hydralazine.  We will also refer [facility]P program [facility].  [facility] her blood pressure twice daily and was given an advance hypertension clinic education handbook with information on the DASH diet.  Chronic diastolic CHF (congestive heart failure), NYHA class 2 She  is euvolemic today.  Continue blood pressure management as above.  Sleep apnea Awaiting CPAP.  She was encouraged to use it once it arrives.  Hyperlipidemia with target LDL less than 70 Lipids well have been controlled.  Continue atorvastatin.  Morbid obesity ([place]) Referral to the PREP exercise program through the [facility].   Screening for Secondary Hypertension:  Causes 04/01/2021  Drugs/Herbals Screened     - Comments some caffeine  Renovascular HTN Screened     - Comments renal Dopplers negative 02/2020  Sleep Apnea Screened     - Comments awaiting CPAP  Thyroid Disease Screened  Hyperaldosteronism Screened  Pheochromocytoma N/A  Cushing's Syndrome N/A  Hyperparathyroidism N/A  Coarctation of the Aorta Screened     - Comments BP symmetric  Compliance Screened    Relevant Labs/Studies: Basic Labs Latest Ref Rng & Units 03/18/2021 03/14/2021 03/13/2021  Sodium 135 - 145 mEq/L 141 136 137  Potassium 3.5 - 5.1 mEq/L 4.6 3.9 3.7  Creatinine 0.40 - 1.20 mg/dL 2.46(H) 2.64(H) 2.82(H)    Thyroid  Latest Ref Rng & Units 03/08/2021 12/11/2019  TSH 0.350 - 4.500 uIU/mL 2.514 3.250    Renin/Aldosterone  Latest Ref Rng & Units 12/11/2019  Aldosterone 0.0 - 30.0 ng/dL 5.5  Renin 0.167 - 5.380 ng/mL/hr 0.426  Aldos/Renin Ratio 0.0 - 30.0 12.9          Renovascular  02/29/2020  Renal Artery [place] Completed Yes     Disposition:    FU with PharmD in 1 month.  FU with Damel Querry C. Oval Linsey, MD, [facility] in 4  months.   Medication Adjustments/Labs and Tests Ordered: Current medicines are reviewed at length with the patient today.  Concerns regarding medicines are outlined above.   Orders Placed This Encounter  Procedures   Amb Referral To Provider Referral Exercise Program (P.R.E.P)    Meds ordered this encounter  Medications   DISCONTD: cloNIDine (CATAPRES) 0.1 MG tablet    Sig: Take 1 tablet (0.1 mg total) by mouth 2 (two) times daily.    Dispense:  180 tablet    Refill:  3   cloNIDine (CATAPRES) 0.1 MG tablet    Sig: Take 1 tablet (0.1 mg total) by mouth 2 (two) times daily.    Dispense:  180 tablet    Refill:  3    I,Mathew Stumpf,acting as a scribe for Skeet Latch, MD.,have documented all relevant documentation on the behalf of Skeet Latch, MD,as directed by  Skeet Latch, MD while in the presence of Skeet Latch, MD.  I, [place] Oval Linsey, MD have reviewed all documentation for this visit.  The documentation of the exam, diagnosis, procedures, and orders on 04/01/2021 are all accurate and complete.   Signed, Skeet Latch, MD  04/01/2021 12:56 PM    [facility]

## 2021-03-19 ENCOUNTER — Telehealth: Payer: Self-pay

## 2021-03-19 NOTE — Telephone Encounter (Signed)
Caller says that she missed a call from the office and is returning the call.  Telephone: [phone]

## 2021-03-20 ENCOUNTER — Telehealth: Payer: Self-pay

## 2021-03-20 NOTE — Telephone Encounter (Signed)
VMT pt requesting call back to discuss referral to PREP 

## 2021-03-21 ENCOUNTER — Telehealth: Payer: Self-pay | Admitting: Family

## 2021-03-21 ENCOUNTER — Ambulatory Visit: Payer: 59 | Admitting: Psychology

## 2021-03-21 NOTE — Telephone Encounter (Signed)
Forms dropped off at front office Placed in bin for provider to sign  Patient would like to be called when ready to be picked up

## 2021-03-21 NOTE — Telephone Encounter (Signed)
Forms received and has been placed in folder to be completed.

## 2021-03-21 NOTE — Telephone Encounter (Signed)
I have called the pt and relayed the message. We have also scheduled the follow up.

## 2021-03-24 ENCOUNTER — Telehealth: Payer: Self-pay

## 2021-03-24 NOTE — Telephone Encounter (Signed)
[facility] pt on Friday Returned call Explained PREP to pt. Interested in participating. Could do [facility] with an afternoon class. Will need assist with cost of program-no transportation need.  Advised will call her when the next one starts.

## 2021-03-25 ENCOUNTER — Telehealth: Payer: Self-pay | Admitting: Family

## 2021-03-25 DIAGNOSIS — Z0279 Encounter for issue of other medical certificate: Secondary | ICD-10-CM

## 2021-03-25 NOTE — Telephone Encounter (Signed)
I have called the pt and spoke to her daughter about the forms being completed.   She is asking that we extend her leave until 04/07/21 instead of 04/01/21.   I have informed her that I will send a message to the provider to confirm that is okay.   Also I have received the fax number to [place]: [phone]

## 2021-03-25 NOTE — Telephone Encounter (Signed)
I have called Shivali back to get clarification on what is needed on the FMLA forms. The one we have recently received is for her Daughter. Corine's daughter, Melinda Crutch, has taken time out of work and she is planning on going back on 04/01/21.   Deeksha reports that she was admitted on 03/07/21 and discharged on 03/14/21. The FMLA needed for her daughter is 03/10/21-04/01/21 continuously to cover the time that she was taking care of her mom.   Kaleena also reports that she will need some FMLA completed for herself as well. Her form should be covered continuously from 03/07/21-04/25/21. I have informed her that the doctor that is following her condition may be the one to get this completed, however I will ask Vernona Rieger to be sure. Ita reports understanding.

## 2021-03-25 NOTE — Telephone Encounter (Signed)
Pts. Daughter , Melinda Crutch, called regarding FMLA paperwork and wanted to know if she could get her portion of them FMLA extended until 04/07/21.  She can be contacted at : [phone]

## 2021-03-28 ENCOUNTER — Telehealth: Payer: Self-pay | Admitting: Family

## 2021-03-28 NOTE — Telephone Encounter (Signed)
Patient's daughter dropped off [employer] forms to be completed by provider. Placed in [facility] front office inbox.

## 2021-04-01 ENCOUNTER — Encounter (HOSPITAL_BASED_OUTPATIENT_CLINIC_OR_DEPARTMENT_OTHER): Payer: Self-pay | Admitting: Cardiovascular Disease

## 2021-04-01 DIAGNOSIS — Z0279 Encounter for issue of other medical certificate: Secondary | ICD-10-CM

## 2021-04-01 NOTE — Telephone Encounter (Signed)
Forms have been received and given to provider to be completed.

## 2021-04-01 NOTE — Assessment & Plan Note (Signed)
Referral to the PREP exercise program through the [facility]. 

## 2021-04-01 NOTE — Assessment & Plan Note (Signed)
[name] blood pressure remains very poorly controlled.  She has not tolerated doxazosin.  We will try clonidine 0.1 mg twice daily.  Continue carvedilol and hydralazine.  She tolerates 50 mg but not 75 mg of hydralazine.  We will also refer her to the PREP program through the [facility].  She was encouraged to track her blood pressure twice daily and was given an advance hypertension clinic education handbook with information on the DASH diet.

## 2021-04-01 NOTE — Assessment & Plan Note (Signed)
Awaiting CPAP.  She was encouraged to use it once it arrives.

## 2021-04-01 NOTE — Assessment & Plan Note (Signed)
Lipids well have been controlled.  Continue atorvastatin.

## 2021-04-01 NOTE — Assessment & Plan Note (Signed)
She is euvolemic today.  Continue blood pressure management as above.

## 2021-04-01 NOTE — Telephone Encounter (Signed)
Lori Mclaughlin has completed the FMLA form and I have faxed it to [employer] at [phone].   Pt notified.

## 2021-04-04 NOTE — Telephone Encounter (Signed)
Office notes printed and faxed to [place].

## 2021-04-04 NOTE — Telephone Encounter (Signed)
Lori Mclaughlin from [place] is calling in regards of the FMLA forms faxed over to them. She states that she is missing the office notes and any lab results for the month August and possibly September if she was seen on sept. She states that it can be faxed to  [phone]. Please advice.

## 2021-04-10 ENCOUNTER — Ambulatory Visit (HOSPITAL_BASED_OUTPATIENT_CLINIC_OR_DEPARTMENT_OTHER): Payer: 59 | Admitting: Cardiovascular Disease

## 2021-04-10 NOTE — Telephone Encounter (Signed)
Some more forms were faxed in   Not sure if they are new are just a dupe of what we have received already. I placed in [place] folder up front to be looked at just in case

## 2021-04-11 NOTE — Telephone Encounter (Signed)
I have received more paperwork for pt daughter. I have completed it and provider has signed it. Forms has been faxed back to [place].   I have updated pt chart to have both daughters on her contact list.   I have called pt to inform her that the forms have been submitted again.   She has reported understanding.

## 2021-04-15 ENCOUNTER — Telehealth: Payer: Self-pay | Admitting: Cardiology

## 2021-04-15 NOTE — Telephone Encounter (Signed)
Patient states a titration appointment was made for her yesterday, but when she went there she was not on the schedule.

## 2021-04-21 NOTE — Telephone Encounter (Signed)
I called the patient to get her rescheduled.

## 2021-04-22 NOTE — Addendum Note (Signed)
Addended by: Reesa Chew on: 04/22/2021 05:45 PM   Modules accepted: Orders

## 2021-04-23 ENCOUNTER — Telehealth: Payer: Self-pay | Admitting: Cardiovascular Disease

## 2021-04-23 NOTE — Telephone Encounter (Signed)
Velna Hatchet RN caseworker, with [employer], calling to inform they have not heard from the patient and wanted to leave a message encouraging the patient to call them back. She states they help with overall health and it is a benefit of her insurance plan to take advantage of their help. She states the patient can call them back at [phone]

## 2021-04-23 NOTE — Telephone Encounter (Addendum)
Per Annice Pih at [facility] updated valid 10/9, ref# [id number], Pending case # [id number] [phone]

## 2021-04-24 ENCOUNTER — Ambulatory Visit (INDEPENDENT_AMBULATORY_CARE_PROVIDER_SITE_OTHER): Payer: 59 | Admitting: Pharmacist Clinician (PhC)/ Clinical Pharmacy Specialist

## 2021-04-24 ENCOUNTER — Other Ambulatory Visit: Payer: Self-pay

## 2021-04-24 ENCOUNTER — Telehealth: Payer: Self-pay | Admitting: Cardiovascular Disease

## 2021-04-24 DIAGNOSIS — I10 Essential (primary) hypertension: Secondary | ICD-10-CM

## 2021-04-24 MED ORDER — CLONIDINE HCL 0.2 MG PO TABS: 0.2000 mg | ORAL_TABLET | Freq: Two times a day (BID) | ORAL | 3 refills | Status: DC

## 2021-04-24 NOTE — Telephone Encounter (Signed)
  New message:    Patient calling to see if she can get her FMLA extended. She do due to go back tomorrow. However she came in today and her BP is still high. Please advise.

## 2021-04-24 NOTE — Progress Notes (Signed)
   04/24/2021 Lori Mclaughlin 07/24/1961 [id number]   HPI:  Lori Mclaughlin is a 60 y.o. female patient of Dr [place], with a PMH below who presents today for advanced hypertension follow up.  She was originally referred to advanced hypertension by Dr. Turner, but before she could be seen by Dr. Tecumseh she was admitted to [facility] for an episode of acute on chronic heart failure.  She also had AKI and on discharge it was noted that her ARB had been discontinued.   She had undergone a sleep study several months back and will meet with Dr. Turner next month for CPAP titration.  When she saw Dr. [place] her pressure was 185/77 with heart rate of 53.  Choices of treatment have been limited due to renal function, so clonidine 0.1 mg twice daily was added.   Patient is in the office today for follow up with one of her daughters.  She complains of feeling tired all the time, but this has been going on for some time and she's not sure if it's because of a medication or if she's just "lazy".  Saw her nephrologist on Monday.  Apparently her SCr has improved to 1.69 and GFR to 39.  They started her on losartan 25 mg daily j  Past Medical History: CHF Chronic diastolic   CKD Stage 2, recent AKI, GFR most recently at 39 (up from 20)  DM2 8/22 A1c 9.1 - on Kombiglyze and Tuojeo  OSA CPAP titration next month     Blood Pressure Goal:  130/80  Current Medications: carvedilol, hydralazine 50 mg bid, clonidine 0.1 mg   Family Hx: father had MI in 60's;  both parents with hypertension; mother died in 60's; paternal siblings healthy, 2 daughters w/o heart issues  Social Hx: no alcohol, no tobacco,  some tea, rare coffee  Diet: only occasional fried foods,  no added salk; eats regular veggies - fresh, frozen and canned (does rinse canned).   Exercise: wait list for PREP  Home BP readings: daughter notes most home readings 150-170 systolic, diastolic mostly in 90's  Intolerances:  lisinopril - headache, hydralazine > 50 mg caused diarrhea/cramping    Wt Readings from Last 3 Encounters:  04/24/21 265 lb (120.2 kg)  03/18/21 272 lb (123.4 kg)  03/14/21 272 lb 6.4 oz (123.6 kg)   BP Readings from Last 3 Encounters:  04/24/21 (!) 154/88  03/18/21 (!) 185/77  03/18/21 130/80   Pulse Readings from Last 3 Encounters:  04/24/21 62  03/18/21 (!) 53  03/18/21 (!) 55    Current Outpatient Medications  Medication Sig Dispense Refill   allopurinol (ZYLOPRIM) 300 MG tablet TAKE 1 TABLET BY MOUTH  DAILY (Patient taking differently: Take 300 mg by mouth daily.) 90 tablet 3   aspirin EC 81 MG EC tablet Take 1 tablet (81 mg total) by mouth daily.     atorvastatin (LIPITOR) 20 MG tablet Take 1 tablet (20 mg total) by mouth at bedtime. 90 tablet 1   Blood Glucose Monitoring Suppl (ACCU-CHEK GUIDE) w/Device KIT 1 each by Does not apply route 2 (two) times daily. Use to monitor blood glucose level twice per day 1 kit 0   carvedilol (COREG) 6.25 MG tablet Take 1 tablet (6.25 mg total) by mouth 2 (two) times daily with a meal. 60 tablet 0   cloNIDine (CATAPRES) 0.2 MG tablet Take 1 tablet (0.2 mg total) by mouth 2 (two) times daily. 60 tablet 3   Continuous Blood   Gluc Receiver (FREESTYLE LIBRE 2 READER) DEVI USE ONCE AS DIRECTED 1 each 0   Continuous Blood Gluc Sensor (FREESTYLE LIBRE 2 SENSOR) MISC 1 Device by Does not apply route every 14 (fourteen) days. 6 each 3   escitalopram (LEXAPRO) 10 MG tablet Take 1 tablet (10 mg total) by mouth daily. 90 tablet 0   furosemide (LASIX) 40 MG tablet Take 1 tablet (40 mg total) by mouth every three (3) days as needed for fluid or edema. 30 tablet 0   glucose blood test strip Use to monitor blood glucose level twice per day 100 each 12   hydrALAZINE (APRESOLINE) 50 MG tablet Take 1 tablet (50 mg total) by mouth 3 (three) times daily. 270 tablet 3   KOMBIGLYZE XR 2.11-998 MG TB24 TAKE 2 TABLETS BY MOUTH IN  THE MORNING (Patient taking  differently: Take 2 tablets by mouth in the morning.) 180 tablet 0   Lancets (ACCU-CHEK SOFT TOUCH) lancets Use to monitor blood glucose level twice per day 100 each 12   TOUJEO MAX SOLOSTAR 300 UNIT/ML Solostar Pen INJECT 70 UNITS INTO THE  SKIN IN THE MORNING (Patient taking differently: Inject 70 Units into the skin in the morning.) 24 mL 3   losartan (COZAAR) 25 MG tablet Take 25 mg by mouth daily.     No current facility-administered medications for this visit.    Allergies  Allergen Reactions   Lisinopril Other (See Comments)    Headache   Latex Itching and Rash    Past Medical History:  Diagnosis Date   Allergic rhinitis    Asthma    Atypical chest pain    a. 03/2014: normal nuclear stress test.   Chronic diastolic CHF (congestive heart failure) (HCC)    a. Dx 03/2014 with echo - moderate LVH, EF 55-60%, grade 1 diastolic dysfunction, mildly dilated LA.   CKD (chronic kidney disease), stage II    Diabetes mellitus type II    GERD (gastroesophageal reflux disease)    Headache(784.0)    when my bp is up   Hypertension    Hypertensive heart disease    Microcytic anemia    OSA (obstructive sleep apnea)    severe with AHI 77/hr with nocturnal hypoxemia   Osteoarthritis     Blood pressure (!) 154/88, pulse 62, resp. rate 17, height 5' (1.524 m), weight 265 lb (120.2 kg), last menstrual period 02/23/2012, SpO2 97 %.  Essential hypertension, malignant Patient with essential hypertension, currently limited on medication choices due to renal issues.  She was started on losartan 25 mg Monday and she is scheduled for a repeat BMET in 2 weeks.  I asked that she bring this lab result to her next appointment.  Today will increase the clonidine to 0.2 mg twice daily.  She was given instruction on proper BP technique and asked to check twice daily for the next month.  If her renal function continues to improve, I would prefer to put her on olmesartan, as it is a much more powerful  antihypertensive medication than losartan.  Looks as though she was on olmesartan at one time in the past.  For now the increase in clonidine will help to get her pressure down.  If she is unable to tolerate ARBs, may have to consider using clonidine patches, as she admits taking meds three times daily (best for clonidine) would not be easy for her to remember.  Will see her back in 1 month for follow up.    Kristin   [facility] PharmD CPP [facility] [address] [place], [place] [address] [phone]

## 2021-04-24 NOTE — Telephone Encounter (Signed)
Advised patient to contact provider who filled out FMLA as they would need to extend

## 2021-04-24 NOTE — Assessment & Plan Note (Signed)
Patient with essential hypertension, currently limited on medication choices due to renal issues.  She was started on losartan 25 mg Monday and she is scheduled for a repeat BMET in 2 weeks.  I asked that she bring this lab result to her next appointment.  Today will increase the clonidine to 0.2 mg twice daily.  She was given instruction on proper BP technique and asked to check twice daily for the next month.  If her renal function continues to improve, I would prefer to put her on olmesartan, as it is a much more powerful antihypertensive medication than losartan.  Looks as though she was on olmesartan at one time in the past.  For now the increase in clonidine will help to get her pressure down.  If she is unable to tolerate ARBs, may have to consider using clonidine patches, as she admits taking meds three times daily (best for clonidine) would not be easy for her to remember.  Will see her back in 1 month for follow up.

## 2021-04-24 NOTE — Patient Instructions (Addendum)
Return for a a follow up appointment October 27 at 10:30  Bring your labs from nephrology to your next visit  Check your blood pressure at home daily and keep record of the readings.  Take your BP meds as follows:  Increase clonidine to 0.2 mg twice daily  Bring all of your meds, your BP cuff and your record of home blood pressures to your next appointment.  Exercise as you're able, try to walk approximately 30 minutes per day.  Keep salt intake to a minimum, especially watch canned and prepared boxed foods.  Eat more fresh fruits and vegetables and fewer canned items.  Avoid eating in fast food restaurants.    HOW TO TAKE YOUR BLOOD PRESSURE: Rest 5 minutes before taking your blood pressure.  Don't smoke or drink caffeinated beverages for at least 30 minutes before. Take your blood pressure before (not after) you eat. Sit comfortably with your back supported and both feet on the floor (don't cross your legs). Elevate your arm to heart level on a table or a desk. Use the proper sized cuff. It should fit smoothly and snugly around your bare upper arm. There should be enough room to slip a fingertip under the cuff. The bottom edge of the cuff should be 1 inch above the crease of the elbow. Ideally, take 3 measurements at one sitting and record the average.

## 2021-04-29 ENCOUNTER — Telehealth: Payer: Self-pay | Admitting: Family

## 2021-04-29 NOTE — Telephone Encounter (Signed)
I have called pt back to gather more information. Pt reports that she wanted this form completed for her Car Note;  the insurance to pay for it for the time she was out of work. I have informed her these forms are stating that she is disabled. Pt then reports that she is not disabled and that is not what the form is supposed to say. She wants [place] to hold on to them for now, however, she will given them a call back to ensure that she gave [place] the correct forms.   Pt reports that she will need more time off and will have to start a new claim with the cards office. I stated understanding and informed her to keep [place] in the loop on what she is needing. If she needs more paperwork to be completed then she may need to be seen sooner then the Dec appt.   FYI to provider.

## 2021-04-29 NOTE — Telephone Encounter (Signed)
Pt's daughter dropped off document to be filled out by provider ([employer]) 1 page with 2 sides) Pt would like to be called when document ready to pick up at [facility] [phone]. Document put at front office tray under providers name.

## 2021-05-01 NOTE — Telephone Encounter (Signed)
EXPECTED FROM DATE EXPECTED TO DATE Diagnostic Testing 05/04/2021 08/02/2021 APPROVED

## 2021-05-04 ENCOUNTER — Ambulatory Visit (HOSPITAL_BASED_OUTPATIENT_CLINIC_OR_DEPARTMENT_OTHER): Payer: 59 | Attending: Cardiology | Admitting: Cardiology

## 2021-05-04 ENCOUNTER — Other Ambulatory Visit: Payer: Self-pay

## 2021-05-04 VITALS — Ht 60.0 in | Wt 263.0 lb

## 2021-05-04 DIAGNOSIS — G4733 Obstructive sleep apnea (adult) (pediatric): Secondary | ICD-10-CM | POA: Diagnosis present

## 2021-05-05 ENCOUNTER — Encounter: Payer: Self-pay | Admitting: Endocrinology

## 2021-05-05 ENCOUNTER — Telehealth: Payer: Self-pay | Admitting: Pharmacist

## 2021-05-05 NOTE — Telephone Encounter (Signed)
Patient identified while reviewing charts for ASA and primary prevention. Patient identified as being primary prevention and >60. After discussing with Dr. Mayford Knife, it was decided to d/c ASA. I called and reviewed this recommendation with patient who was agreeable to d/c.

## 2021-05-06 NOTE — Procedures (Signed)
   Patient Name: Lori Mclaughlin, Lori Mclaughlin Study Date: 05/04/2021 Gender: Female D.O.B: [birth year] Age (years): 66 Referring Provider: Armanda Magic MD, ABSM Height (inches): 60 Interpreting Physician: Armanda Magic MD, ABSM Weight (lbs): 263 RPSGT: Armen Pickup BMI: 51 MRN: [id number] Neck Size: 15.00  CLINICAL INFORMATION The patient is referred for a CPAP titration to treat sleep apnea.  SLEEP STUDY TECHNIQUE As per the AASM Manual for the Scoring of Sleep and Associated Events v2.3 (April 2016) with a hypopnea requiring 4% desaturations.  The channels recorded and monitored were frontal, central and occipital EEG, electrooculogram (EOG), submentalis EMG (chin), nasal and oral airflow, thoracic and abdominal wall motion, anterior tibialis EMG, snore microphone, electrocardiogram, and pulse oximetry. Continuous positive airway pressure (CPAP) was initiated at the beginning of the study and titrated to treat sleep-disordered breathing.  MEDICATIONS Medications self-administered by patient taken the night of the study : LIPITOR, CLONIDINE, APRESOLINE, kombiglyze xr  TECHNICIAN COMMENTS Comments added by technician: No restroom visted Comments added by scorer: N/A  RESPIRATORY PARAMETERS Optimal PAP Pressure (cm): 10  AHI at Optimal Pressure (/hr):0 Overall Minimal O2 (%):92.0  Supine % at Optimal Pressure (%):51 Minimal O2 at Optimal Pressure (%): 94.0   SLEEP ARCHITECTURE The study was initiated at 10:34:39 PM and ended at 4:26:14 AM.  Sleep onset time was 92.8 minutes and the sleep efficiency was 66.3%. The total sleep time was 233 minutes.  The patient spent 5.4% of the night in stage N1 sleep, 83.5% in stage N2 sleep, 11.2% in stage N3 and 0% in REM.Stage REM latency was N/A minutes  Wake after sleep onset was 25.8. Alpha intrusion was absent. Supine sleep was 47.86%.  CARDIAC DATA The 2 lead EKG demonstrated sinus rhythm. The mean heart rate was 57.8 beats per minute.  Other EKG findings include: PACs. LEG MOVEMENT DATA The total Periodic Limb Movements of Sleep (PLMS) were 0. The PLMS index was 0.0. A PLMS index of <15 is considered normal in adults.  IMPRESSIONS - The optimal PAP pressure was 10 cm of water. - Significant oxygen desaturations were not observed during this titration (min O2 = 92.0%). - No snoring was audible during this study. - PACs were observed during this study. - Clinically significant periodic limb movements were not noted during this study. Arousals associated with PLMs were rare.  DIAGNOSIS - Obstructive Sleep Apnea (G47.33)  RECOMMENDATIONS - Trial of CPAP therapy on 10 cm H2O with a Small size Resmed Full Face Mask AirFit F10 for Her mask and heated humidification. - Avoid alcohol, sedatives and other CNS depressants that may worsen sleep apnea and disrupt normal sleep architecture. - Sleep hygiene should be reviewed to assess factors that may improve sleep quality. - Weight management and regular exercise should be initiated or continued. - Return to Sleep Center for re-evaluation after 4 weeks of therapy  [Electronically signed] 05/06/2021 01:59 PM  Armanda Magic MD, ABSM Diplomate, American Board of Sleep Medicine   NPI: [id number]

## 2021-05-07 ENCOUNTER — Telehealth: Payer: Self-pay | Admitting: Family

## 2021-05-07 NOTE — Telephone Encounter (Signed)
Patient has documentation that needs to be filled out for [employer]- [employer] Medial leave. Patient stated [employer] would like for you to contact them at [phone] and ask to speak with Chasity. Patient also stated return to work date was not mention on documents. 1st day out of work would be August 15th, 2022 and should return October 20th, 2022. Please advise.

## 2021-05-08 ENCOUNTER — Other Ambulatory Visit: Payer: Self-pay

## 2021-05-08 ENCOUNTER — Encounter: Payer: Self-pay | Admitting: Endocrinology

## 2021-05-08 ENCOUNTER — Ambulatory Visit (INDEPENDENT_AMBULATORY_CARE_PROVIDER_SITE_OTHER): Payer: Self-pay | Admitting: Endocrinology

## 2021-05-08 VITALS — BP 194/120 | HR 107 | Ht 60.0 in | Wt 266.2 lb

## 2021-05-08 DIAGNOSIS — E1122 Type 2 diabetes mellitus with diabetic chronic kidney disease: Secondary | ICD-10-CM | POA: Diagnosis not present

## 2021-05-08 DIAGNOSIS — N189 Chronic kidney disease, unspecified: Secondary | ICD-10-CM | POA: Diagnosis not present

## 2021-05-08 DIAGNOSIS — Z794 Long term (current) use of insulin: Secondary | ICD-10-CM | POA: Diagnosis not present

## 2021-05-08 DIAGNOSIS — E119 Type 2 diabetes mellitus without complications: Secondary | ICD-10-CM

## 2021-05-08 LAB — POCT GLYCOSYLATED HEMOGLOBIN (HGB A1C): Hemoglobin A1C: 7.4 % — AB (ref 4.0–5.6)

## 2021-05-08 MED ORDER — RYBELSUS 7 MG PO TABS: 7.0000 mg | ORAL_TABLET | Freq: Every day | ORAL | 3 refills | Status: DC

## 2021-05-08 MED ORDER — TOUJEO MAX SOLOSTAR 300 UNIT/ML ~~LOC~~ SOPN: 55.0000 [IU] | PEN_INJECTOR | SUBCUTANEOUS | 3 refills | Status: DC

## 2021-05-08 NOTE — Patient Instructions (Addendum)
Your blood pressure is high today.  Please see your primary care provider soon, to have it rechecked.   Please stay off the Kombliglyze.   I have sent a prescription to your pharmacy, to start Rybelsus.  Please reduce the insulin to 55 units each morning.   Please call or message [place] next week, to tell us how the blood sugar is doing.  Then maybe we can increase the Rybelsus.  Please come back for a follow-up appointment in 1 month.

## 2021-05-08 NOTE — Progress Notes (Signed)
Subjective:    Patient ID: Lori Mclaughlin, female    DOB: July 09, 1961, 60 y.o.   MRN: [id number]  HPI Pt returns for f/u of diabetes mellitus: DM type: Insulin-requiring type 2.   Dx'ed: 5615 Complications: DR and renal insuff  Therapy: insulin since 2018 GDM: 1984 and 1987 DKA: never Severe hypoglycemia: never Pancreatitis: never Pancreatic imaging: never Other: she takes qd insulin, after poor results with multiple daily injections.   Interval history: She takes 65 units qam.  I reviewed continuous glucose monitor data.  Glucose varies from 60-260.  It is in general highest at [facility].  It then decreases until 12MN, then is flat until 10AM.  She has mild hypoglycemia several times per week.  This happens fasting Past Medical History:  Diagnosis Date   Allergic rhinitis    Asthma    Atypical chest pain    a. 03/2014: normal nuclear stress test.   Chronic diastolic CHF (congestive heart failure) ([place])    a. Dx 03/2014 with echo - moderate LVH, EF 37-94%, grade 1 diastolic dysfunction, mildly dilated LA.   CKD (chronic kidney disease), stage II    Diabetes mellitus type II    GERD (gastroesophageal reflux disease)    Headache(784.0)    when my bp is up   Hypertension    Hypertensive heart disease    Microcytic anemia    OSA (obstructive sleep apnea)    severe with AHI 77/hr with nocturnal hypoxemia   Osteoarthritis     Past Surgical History:  Procedure Laterality Date   SHOULDER ARTHROSCOPY Bilateral    TUBAL LIGATION      Social History   Socioeconomic History   Marital status: Divorced    Spouse name: Not on file   Number of children: Not on file   Years of education: Not on file   Highest education level: Not on file  Occupational History   Not on file  Tobacco Use   Smoking status: Former    Types: Cigarettes    Quit date: 08/29/2010    Years since quitting: 10.7   Smokeless tobacco: Never  Vaping Use   Vaping Use: Never used  Substance and Sexual  Activity   Alcohol use: No    Alcohol/week: 5.0 standard drinks    Types: 5 Glasses of wine per week   Drug use: No   Sexual activity: Yes    Birth control/protection: Surgical  Other Topics Concern   Not on file  Social History Narrative   Regular Exercise- yes   Social Determinants of Health   Financial Resource Strain: Not on file  Food Insecurity: Not on file  Transportation Needs: Not on file  Physical Activity: Not on file  Stress: Not on file  Social Connections: Not on file  Intimate Partner Violence: Not on file    Current Outpatient Medications on File Prior to Visit  Medication Sig Dispense Refill   allopurinol (ZYLOPRIM) 300 MG tablet TAKE 1 TABLET BY MOUTH  DAILY (Patient taking differently: Take 300 mg by mouth daily.) 90 tablet 3   atorvastatin (LIPITOR) 20 MG tablet Take 1 tablet (20 mg total) by mouth at bedtime. 90 tablet 1   Blood Glucose Monitoring Suppl (ACCU-CHEK GUIDE) w/Device KIT 1 each by Does not apply route 2 (two) times daily. Use to monitor blood glucose level twice per day 1 kit 0   carvedilol (COREG) 6.25 MG tablet Take 1 tablet (6.25 mg total) by mouth 2 (two) times daily with a  meal. 60 tablet 0   cloNIDine (CATAPRES) 0.2 MG tablet Take 1 tablet (0.2 mg total) by mouth 2 (two) times daily. 60 tablet 3   Continuous Blood Gluc Receiver (FREESTYLE LIBRE 2 READER) DEVI USE ONCE AS DIRECTED 1 each 0   Continuous Blood Gluc Sensor (FREESTYLE LIBRE 2 SENSOR) MISC 1 Device by Does not apply route every 14 (fourteen) days. 6 each 3   escitalopram (LEXAPRO) 10 MG tablet Take 1 tablet (10 mg total) by mouth daily. 90 tablet 0   furosemide (LASIX) 40 MG tablet Take 1 tablet (40 mg total) by mouth every three (3) days as needed for fluid or edema. 30 tablet 0   glucose blood test strip Use to monitor blood glucose level twice per day 100 each 12   hydrALAZINE (APRESOLINE) 50 MG tablet Take 1 tablet (50 mg total) by mouth 3 (three) times daily. 270 tablet 3    Lancets (ACCU-CHEK SOFT TOUCH) lancets Use to monitor blood glucose level twice per day 100 each 12   losartan (COZAAR) 25 MG tablet Take 25 mg by mouth daily.     No current facility-administered medications on file prior to visit.    Allergies  Allergen Reactions   Lisinopril Other (See Comments)    Headache   Latex Itching and Rash    Family History  Problem Relation Age of Onset   Heart attack Mother    Hypertension Mother    Cancer Father    Hypertension Brother    Hypertension Maternal Grandmother    Hypertension Maternal Grandfather    Diabetes Other    Hypertension Other    Stroke Other    Cancer Other        colon; prostate   Kidney disease Neg Hx    Alcohol abuse Neg Hx    Asthma Neg Hx    COPD Neg Hx    Depression Neg Hx    Drug abuse Neg Hx    Early death Neg Hx    Hearing loss Neg Hx    Heart disease Neg Hx    Hyperlipidemia Neg Hx     BP (!) 194/120 (BP Location: Right Arm, Patient Position: Sitting, Cuff Size: Large)   Pulse (!) 107   Ht 5' (1.524 m)   Wt 266 lb 3.2 oz (120.7 kg)   LMP 02/23/2012   SpO2 98%   BMI 51.99 kg/m    Review of Systems     Objective:   Physical Exam    Lab Results  Component Value Date   HGBA1C 7.4 (A) 05/08/2021      Assessment & Plan:  Insulin-requiring type 2 DM. Hypoglycemia, due to insulin: we'll favor GLP rx.    Patient Instructions  Your blood pressure is high today.  Please see your primary care provider soon, to have it rechecked.   Please stay off the Paint.   I have sent a prescription to your pharmacy, to start Rybelsus.  Please reduce the insulin to 55 units each morning.   Please call or message [place] next week, to tell us how the blood sugar is doing.  Then maybe we can increase the Rybelsus.  Please come back for a follow-up appointment in 1 month.

## 2021-05-09 ENCOUNTER — Ambulatory Visit (HOSPITAL_BASED_OUTPATIENT_CLINIC_OR_DEPARTMENT_OTHER): Payer: 59 | Admitting: Cardiovascular Disease

## 2021-05-09 NOTE — Telephone Encounter (Signed)
I have called pt and informed her that this will be the last set of paperwork that we are completing before she is seen. She will need a sooner appt if she continues to stay out of work. We are informed her she is cleared to go back from our stand point.   Pt stated understanding.   I have completed nursing part of the [employer] paperwork. I given it to provider for her part.

## 2021-05-10 DIAGNOSIS — E119 Type 2 diabetes mellitus without complications: Secondary | ICD-10-CM | POA: Insufficient documentation

## 2021-05-13 ENCOUNTER — Other Ambulatory Visit: Payer: Self-pay | Admitting: Endocrinology

## 2021-05-13 ENCOUNTER — Telehealth: Payer: Self-pay | Admitting: Endocrinology

## 2021-05-13 NOTE — Telephone Encounter (Signed)
please contact patient's dtr: I need to know the reason why your mother doesn't drive, so the form can be done by the proper medical provider for completion.

## 2021-05-14 NOTE — Telephone Encounter (Signed)
Message sent thru MyChart 

## 2021-05-20 ENCOUNTER — Telehealth: Payer: Self-pay | Admitting: *Deleted

## 2021-05-20 NOTE — Telephone Encounter (Signed)
-----   Message from Quintella Reichert, MD sent at 05/06/2021  2:00 PM EDT ----- Please let patient know that they had a successful PAP titration and let DME know that orders are in EPIC.  Please set up 6 week OV with me.

## 2021-05-20 NOTE — Telephone Encounter (Signed)
The patient has been notified of the result and verbalized understanding.  All questions (if any) were answered. Latrelle Dodrill, CMA 05/20/2021 3:02 PM     Upon patient request DME selection is [facility] Patient understands he will be contacted by [facility] to set up his cpap. Patient understands to call if [facility] does not contact him with new setup in a timely manner. Patient understands they will be called once confirmation has been received from /[facility] that they have received their new machine to schedule 10 week follow up appointment.   [facility] notified of new cpap order  Please add to airview Patient was grateful for the call and thanked me

## 2021-05-22 ENCOUNTER — Other Ambulatory Visit: Payer: Self-pay

## 2021-05-22 ENCOUNTER — Encounter: Payer: Self-pay | Admitting: Pharmacist Clinician (PhC)/ Clinical Pharmacy Specialist

## 2021-05-22 ENCOUNTER — Ambulatory Visit (INDEPENDENT_AMBULATORY_CARE_PROVIDER_SITE_OTHER): Payer: 59 | Admitting: Pharmacist Clinician (PhC)/ Clinical Pharmacy Specialist

## 2021-05-22 DIAGNOSIS — I10 Essential (primary) hypertension: Secondary | ICD-10-CM

## 2021-05-22 NOTE — Patient Instructions (Signed)
Any questions or concerns, please reach out to me thru MyChart  Phillips Hay or Dr. Chilton Si)  Check your blood pressure at home daily and keep record of the readings.  Take your BP meds as follows:  Increase hydralazine to 100 mg three times daily  Continue with other medications  Reach out to nephrology about the losartan, we do occasionally have patients develop a cough with this ARB.  Ask if they would be willing to try valsartan or olmesartan instead.    Bring all of your meds, your BP cuff and your record of home blood pressures to your next appointment.  Exercise as you're able, try to walk approximately 30 minutes per day.  Keep salt intake to a minimum, especially watch canned and prepared boxed foods.  Eat more fresh fruits and vegetables and fewer canned items.  Avoid eating in fast food restaurants.    HOW TO TAKE YOUR BLOOD PRESSURE: Rest 5 minutes before taking your blood pressure.  Don't smoke or drink caffeinated beverages for at least 30 minutes before. Take your blood pressure before (not after) you eat. Sit comfortably with your back supported and both feet on the floor (don't cross your legs). Elevate your arm to heart level on a table or a desk. Use the proper sized cuff. It should fit smoothly and snugly around your bare upper arm. There should be enough room to slip a fingertip under the cuff. The bottom edge of the cuff should be 1 inch above the crease of the elbow. Ideally, take 3 measurements at one sitting and record the average.

## 2021-05-22 NOTE — Progress Notes (Signed)
05/23/2021 Lori Mclaughlin 1961/06/12 [id number]   HPI:  Lori Mclaughlin is a 60 y.o. female patient of Dr Oval Linsey, with a PMH below who presents today for advanced hypertension follow up.  She was originally referred to advanced hypertension by Dr. Radford Pax, but before she could be seen by Dr. Oval Linsey she was admitted to [facility] for an episode of acute on chronic heart failure.  She also had AKI and on discharge it was noted that her ARB had been discontinued.   She had undergone a sleep study several months back and will meet with Dr. Radford Pax next month for CPAP titration.  When she saw Dr. Oval Linsey her pressure was 185/77 with heart rate of 53.  Choices of treatment have been limited due to renal function, so clonidine 0.1 mg twice daily was added, then increased to 0.2 mg bid when failed to achieve BP goal.   Since then she was seen by nephrology and started on losartan 25 mg daily.    Today she is doing well.  Has noticed a dry cough in the past few weeks and was wondering if related to any medications.    Past Medical History: CHF Chronic diastolic   CKD Stage 2, recent AKI, GFR most recently at 56 (up from 41)  DM2 8/22 A1c 9.1 - on Kombiglyze and Tuojeo  OSA CPAP titration next month     Blood Pressure Goal:  130/80  Current Medications: carvedilol 6.25 mg bid, hydralazine 50 mg tid, clonidine 0.2 mg bid, losartan 25 mg qd  Family Hx: father had MI in 19's;  both parents with hypertension; mother died in 40's; paternal siblings healthy, 2 daughters w/o heart issues  Social Hx: no alcohol, no tobacco,  some tea, no coffee  Diet: only occasional fried foods,  no added salk; eats regular veggies - fresh, frozen and canned (does rinse canned).   Exercise: wait list for PREP  Home BP readings: this week at home: 200/96, 200/98, 187/89, 186/96, 204/101  Intolerances: lisinopril - headache, hydralazine > 50 mg caused diarrhea/cramping   Wt Readings from  Last 3 Encounters:  05/22/21 266 lb 6.4 oz (120.8 kg)  05/08/21 266 lb 3.2 oz (120.7 kg)  05/04/21 263 lb (119.3 kg)   BP Readings from Last 3 Encounters:  05/22/21 (!) 154/96  05/08/21 (!) 194/120  04/24/21 (!) 154/88   Pulse Readings from Last 3 Encounters:  05/22/21 69  05/08/21 (!) 107  04/24/21 62    Current Outpatient Medications  Medication Sig Dispense Refill   allopurinol (ZYLOPRIM) 300 MG tablet TAKE 1 TABLET BY MOUTH  DAILY (Patient taking differently: Take 300 mg by mouth daily.) 90 tablet 3   Blood Glucose Monitoring Suppl (ACCU-CHEK GUIDE) w/Device KIT 1 each by Does not apply route 2 (two) times daily. Use to monitor blood glucose level twice per day 1 kit 0   cloNIDine (CATAPRES) 0.2 MG tablet Take 1 tablet (0.2 mg total) by mouth 2 (two) times daily. 60 tablet 3   Continuous Blood Gluc Receiver (FREESTYLE LIBRE 2 READER) DEVI USE ONCE AS DIRECTED 1 each 0   Continuous Blood Gluc Sensor (FREESTYLE LIBRE 2 SENSOR) MISC 1 Device by Does not apply route every 14 (fourteen) days. 6 each 3   escitalopram (LEXAPRO) 10 MG tablet Take 1 tablet (10 mg total) by mouth daily. 90 tablet 0   furosemide (LASIX) 40 MG tablet Take 1 tablet (40 mg total) by mouth every three (3) days as  needed for fluid or edema. 30 tablet 0   glucose blood test strip Use to monitor blood glucose level twice per day 100 each 12   insulin glargine, 2 Unit Dial, (TOUJEO MAX SOLOSTAR) 300 UNIT/ML Solostar Pen Inject 55 Units into the skin every morning. 18 mL 3   Lancets (ACCU-CHEK SOFT TOUCH) lancets Use to monitor blood glucose level twice per day 100 each 12   losartan (COZAAR) 25 MG tablet Take 25 mg by mouth daily.     Semaglutide (RYBELSUS) 7 MG TABS Take 7 mg by mouth daily. 90 tablet 3   atorvastatin (LIPITOR) 20 MG tablet Take 1 tablet (20 mg total) by mouth at bedtime. 90 tablet 3   carvedilol (COREG) 6.25 MG tablet Take 1 tablet (6.25 mg total) by mouth 2 (two) times daily with a meal. 180  tablet 3   hydrALAZINE (APRESOLINE) 100 MG tablet Take 1 tablet (100 mg total) by mouth 3 (three) times daily. 270 tablet 3   No current facility-administered medications for this visit.    Allergies  Allergen Reactions   Lisinopril Other (See Comments)    Headache   Latex Itching and Rash    Past Medical History:  Diagnosis Date   Allergic rhinitis    Asthma    Atypical chest pain    a. 03/2014: normal nuclear stress test.   Chronic diastolic CHF (congestive heart failure) ([place])    a. Dx 03/2014 with echo - moderate LVH, EF 16-10%, grade 1 diastolic dysfunction, mildly dilated LA.   CKD (chronic kidney disease), stage II    Diabetes mellitus type II    GERD (gastroesophageal reflux disease)    Headache(784.0)    when my bp is up   Hypertension    Hypertensive heart disease    Microcytic anemia    OSA (obstructive sleep apnea)    severe with AHI 77/hr with nocturnal hypoxemia   Osteoarthritis     Blood pressure (!) 154/96, pulse 69, resp. rate 17, height 5' (1.524 m), weight 266 lb 6.4 oz (120.8 kg), last menstrual period 02/23/2012, SpO2 99 %.  Essential hypertension, malignant Patient with resistant hypertension, with some medication limitations due to kidney disease.  I suspect that her cough could be related to losartan, as we do occasionally see that.  Will have her contact nephrology and gave her suggestions of switching to olmesartan or valsartan at their discretion, as these don't induce cough and are more potent at lowering BP.  For now will increase the hydralazine to 100 mg three times daily.  She should continue with regular home BP monitoring and we will see her again in 2 months for follow up.  In the meantime she can reach out to [place] via MyChart with any further concerns.     Tommy Medal PharmD CPP [facility] [address] [place], [place] [address] [phone]

## 2021-05-23 ENCOUNTER — Other Ambulatory Visit: Payer: Self-pay | Admitting: Cardiovascular Disease

## 2021-05-23 ENCOUNTER — Encounter: Payer: Self-pay | Admitting: Pharmacist Clinician (PhC)/ Clinical Pharmacy Specialist

## 2021-05-23 MED ORDER — CARVEDILOL 6.25 MG PO TABS
6.2500 mg | ORAL_TABLET | Freq: Two times a day (BID) | ORAL | 3 refills | Status: DC
Start: 2021-05-23 — End: 2022-04-23

## 2021-05-23 MED ORDER — ATORVASTATIN CALCIUM 20 MG PO TABS: 20.0000 mg | ORAL_TABLET | Freq: Every day | ORAL | 1 refills | Status: DC

## 2021-05-23 MED ORDER — HYDRALAZINE HCL 100 MG PO TABS: 100.0000 mg | ORAL_TABLET | Freq: Three times a day (TID) | ORAL | 1 refills | Status: DC

## 2021-05-23 MED ORDER — HYDRALAZINE HCL 100 MG PO TABS: 100.0000 mg | ORAL_TABLET | Freq: Three times a day (TID) | ORAL | 3 refills | Status: DC

## 2021-05-23 MED ORDER — ATORVASTATIN CALCIUM 20 MG PO TABS: 20.0000 mg | ORAL_TABLET | Freq: Every day | ORAL | 3 refills | Status: DC

## 2021-05-23 MED ORDER — CARVEDILOL 6.25 MG PO TABS: 6.2500 mg | ORAL_TABLET | Freq: Two times a day (BID) | ORAL | 0 refills | Status: DC

## 2021-05-23 NOTE — Assessment & Plan Note (Signed)
Patient with resistant hypertension, with some medication limitations due to kidney disease.  I suspect that her cough could be related to losartan, as we do occasionally see that.  Will have her contact nephrology and gave her suggestions of switching to olmesartan or valsartan at their discretion, as these don't induce cough and are more potent at lowering BP.  For now will increase the hydralazine to 100 mg three times daily.  She should continue with regular home BP monitoring and we will see her again in 2 months for follow up.  In the meantime she can reach out to [place] via MyChart with any further concerns.

## 2021-05-27 ENCOUNTER — Other Ambulatory Visit: Payer: Self-pay | Admitting: Endocrinology

## 2021-05-27 ENCOUNTER — Encounter: Payer: Self-pay | Admitting: Endocrinology

## 2021-05-27 MED ORDER — TOUJEO MAX SOLOSTAR 300 UNIT/ML ~~LOC~~ SOPN: 64.0000 [IU] | PEN_INJECTOR | SUBCUTANEOUS | 3 refills | Status: DC

## 2021-05-27 MED ORDER — RYBELSUS 14 MG PO TABS: 14.0000 mg | ORAL_TABLET | Freq: Every day | ORAL | 3 refills | Status: DC

## 2021-06-06 ENCOUNTER — Ambulatory Visit: Payer: 59 | Admitting: Endocrinology

## 2021-06-13 ENCOUNTER — Ambulatory Visit: Payer: Self-pay | Admitting: Family

## 2021-06-13 ENCOUNTER — Other Ambulatory Visit: Payer: Self-pay

## 2021-06-13 ENCOUNTER — Encounter: Payer: Self-pay | Admitting: Family

## 2021-06-13 ENCOUNTER — Ambulatory Visit (INDEPENDENT_AMBULATORY_CARE_PROVIDER_SITE_OTHER): Payer: 59 | Admitting: Family

## 2021-06-13 ENCOUNTER — Telehealth: Payer: Self-pay

## 2021-06-13 VITALS — BP 150/90 | HR 79 | Temp 97.8°F | Ht 60.0 in | Wt 266.4 lb

## 2021-06-13 DIAGNOSIS — D649 Anemia, unspecified: Secondary | ICD-10-CM

## 2021-06-13 DIAGNOSIS — H6983 Other specified disorders of Eustachian tube, bilateral: Secondary | ICD-10-CM

## 2021-06-13 DIAGNOSIS — L980 Pyogenic granuloma: Secondary | ICD-10-CM

## 2021-06-13 DIAGNOSIS — R112 Nausea with vomiting, unspecified: Secondary | ICD-10-CM

## 2021-06-13 LAB — COMPREHENSIVE METABOLIC PANEL
ALT: 9 U/L (ref 0–35)
AST: 12 U/L (ref 0–37)
Albumin: 3.5 g/dL (ref 3.5–5.2)
Alkaline Phosphatase: 107 U/L (ref 39–117)
BUN: 22 mg/dL (ref 6–23)
CO2: 26 mEq/L (ref 19–32)
Calcium: 9 mg/dL (ref 8.4–10.5)
Chloride: 103 mEq/L (ref 96–112)
Creatinine, Ser: 1.76 mg/dL — ABNORMAL HIGH (ref 0.40–1.20)
GFR: 30.72 mL/min — ABNORMAL LOW (ref >60.00)
Glucose, Bld: 294 mg/dL — ABNORMAL HIGH (ref 70–99)
Potassium: 4.4 mEq/L (ref 3.5–5.1)
Sodium: 138 mEq/L (ref 135–145)
Total Bilirubin: 0.3 mg/dL (ref 0.2–1.2)
Total Protein: 7 g/dL (ref 6.0–8.3)

## 2021-06-13 LAB — CBC WITH DIFFERENTIAL/PLATELET
Basophils Absolute: 0 10*3/uL (ref 0.0–0.1)
Basophils Relative: 0.6 % (ref 0.0–3.0)
Eosinophils Absolute: 0.5 10*3/uL (ref 0.0–0.7)
Eosinophils Relative: 7.1 % — ABNORMAL HIGH (ref 0.0–5.0)
HCT: 30.6 % — ABNORMAL LOW (ref 36.0–46.0)
Hemoglobin: 9.5 g/dL — ABNORMAL LOW (ref 12.0–15.0)
Lymphocytes Relative: 21 % (ref 12.0–46.0)
Lymphs Abs: 1.4 10*3/uL (ref 0.7–4.0)
MCHC: 31 g/dL (ref 30.0–36.0)
MCV: 74.9 fl — ABNORMAL LOW (ref 78.0–100.0)
Monocytes Absolute: 0.5 10*3/uL (ref 0.1–1.0)
Monocytes Relative: 8.2 % (ref 3.0–12.0)
Neutro Abs: 4.1 10*3/uL (ref 1.4–7.7)
Neutrophils Relative %: 63.1 % (ref 43.0–77.0)
Platelets: 249 10*3/uL (ref 150.0–400.0)
RBC: 4.08 Mil/uL (ref 3.87–5.11)
RDW: 20.8 % — ABNORMAL HIGH (ref 11.5–15.5)
WBC: 6.5 10*3/uL (ref 4.0–10.5)

## 2021-06-13 LAB — AMYLASE: Amylase: 28 U/L (ref 27–131)

## 2021-06-13 LAB — LIPASE: Lipase: 25 U/L (ref 11.0–59.0)

## 2021-06-13 MED ORDER — FLUTICASONE PROPIONATE 50 MCG/ACT NA SUSP: 2.0000 | Freq: Every day | NASAL | 6 refills | Status: DC

## 2021-06-13 NOTE — Progress Notes (Signed)
Lori Mclaughlin is a 60 y.o. female with the following history as recorded in EpicCare:  Patient Active Problem List   Diagnosis Date Noted   Diabetes ([place]) 05/10/2021   Hypertensive emergency 03/08/2021   Chronic kidney disease (CKD) stage G3b/A1, moderately decreased glomerular filtration rate (GFR) between 30-44 mL/min/1.73 square meter and albuminuria creatinine ratio less than 30 mg/g ([facility]) 03/08/2021   Acute on chronic diastolic CHF (congestive heart failure) ([place]) 03/07/2021   Thiamine deficiency 09/19/2017   Myalgia 03/10/2017   Screen for colon cancer 06/12/2015   Visit for screening mammogram 06/12/2015   Hypersomnia 04/11/2014   Morbid obesity ([employer]) 04/11/2014   Bradycardia, drug induced 04/11/2014   Chronic diastolic CHF (congestive heart failure), NYHA class 2 ([place]) 03/30/2014   Low back pain radiating to left leg 01/26/2012   Sleep apnea 11/13/2011   Hyperlipidemia with target LDL less than 70 01/14/2011   Deficiency anemia 08/05/2009   Essential hypertension, malignant 08/05/2009   ALLERGIC RHINITIS 08/05/2009   GERD 08/05/2009   OSTEOARTHRITIS 08/05/2009    Current Outpatient Medications  Medication Sig Dispense Refill   allopurinol (ZYLOPRIM) 300 MG tablet TAKE 1 TABLET BY MOUTH  DAILY (Patient taking differently: Take 300 mg by mouth daily.) 90 tablet 3   atorvastatin (LIPITOR) 20 MG tablet Take 1 tablet (20 mg total) by mouth at bedtime. 90 tablet 3   carvedilol (COREG) 6.25 MG tablet Take 1 tablet (6.25 mg total) by mouth 2 (two) times daily with a meal. 180 tablet 3   cloNIDine (CATAPRES) 0.2 MG tablet Take 1 tablet (0.2 mg total) by mouth 2 (two) times daily. 60 tablet 3   Continuous Blood Gluc Receiver (FREESTYLE LIBRE 2 READER) DEVI USE ONCE AS DIRECTED 1 each 0   Continuous Blood Gluc Sensor (FREESTYLE LIBRE 2 SENSOR) MISC 1 Device by Does not apply route every 14 (fourteen) days. 6 each 3   escitalopram (LEXAPRO) 10 MG tablet Take 1 tablet (10 mg  total) by mouth daily. 90 tablet 0   fluticasone (FLONASE) 50 MCG/ACT nasal spray Place 2 sprays into both nostrils daily. 16 g 6   furosemide (LASIX) 40 MG tablet Take 1 tablet (40 mg total) by mouth every three (3) days as needed for fluid or edema. 30 tablet 0   hydrALAZINE (APRESOLINE) 100 MG tablet Take 1 tablet (100 mg total) by mouth 3 (three) times daily. 270 tablet 3   insulin glargine, 2 Unit Dial, (TOUJEO MAX SOLOSTAR) 300 UNIT/ML Solostar Pen Inject 64 Units into the skin every morning. 24 mL 3   losartan (COZAAR) 25 MG tablet Take 25 mg by mouth daily.     Semaglutide (RYBELSUS) 14 MG TABS Take 14 mg by mouth daily. 90 tablet 3   No current facility-administered medications for this visit.    Allergies: Lisinopril and Latex  Past Medical History:  Diagnosis Date   Allergic rhinitis    Asthma    Atypical chest pain    a. 03/2014: normal nuclear stress test.   Chronic diastolic CHF (congestive heart failure) ([place])    a. Dx 03/2014 with echo - moderate LVH, EF 03-75%, grade 1 diastolic dysfunction, mildly dilated LA.   CKD (chronic kidney disease), stage II    Diabetes mellitus type II    GERD (gastroesophageal reflux disease)    Headache(784.0)    when my bp is up   Hypertension    Hypertensive heart disease    Microcytic anemia    OSA (obstructive sleep apnea)  severe with AHI 77/hr with nocturnal hypoxemia   Osteoarthritis     Past Surgical History:  Procedure Laterality Date   SHOULDER ARTHROSCOPY Bilateral    TUBAL LIGATION      Family History  Problem Relation Age of Onset   Heart attack Mother    Hypertension Mother    Cancer Father    Hypertension Brother    Hypertension Maternal Grandmother    Hypertension Maternal Grandfather    Diabetes Other    Hypertension Other    Stroke Other    Cancer Other        colon; prostate   Kidney disease Neg Hx    Alcohol abuse Neg Hx    Asthma Neg Hx    COPD Neg Hx    Depression Neg Hx    Drug abuse Neg Hx     Early death Neg Hx    Hearing loss Neg Hx    Heart disease Neg Hx    Hyperlipidemia Neg Hx     Social History   Tobacco Use   Smoking status: Former    Types: Cigarettes    Quit date: 08/29/2010    Years since quitting: 10.7   Smokeless tobacco: Never  Substance Use Topics   Alcohol use: No    Alcohol/week: 5.0 standard drinks    Types: 5 Glasses of wine per week    Subjective:   Accompanied by daughter today; Concerned about non-healing "blister" on Right middle finger; symptoms on and off x 3 months;  Requesting work note completed to allow her to work from home;  Also notes that on 3 separate episodes last week she vomited "out of nowhere." Did not seem related to food; feeling better this week; does still have gallbladder; on oral Rybelsus;      Objective:  Vitals:   06/13/21 1137  BP: (!) 150/90  Pulse: 79  Temp: 97.8 F (36.6 C)  TempSrc: Oral  SpO2: 97%  Weight: 266 lb 6.4 oz (120.8 kg)  Height: 5' (1.524 m)    General: Well developed, well nourished, in no acute distress  Skin : Warm and dry. Blister lesion noted on right middle finger;  Head: Normocephalic and atraumatic  Eyes: Sclera and conjunctiva clear; pupils round and reactive to light; extraocular movements intact  Ears: External normal; canals clear; tympanic membranes normal  Oropharynx: Pink, supple. No suspicious lesions  Neck: Supple without thyromegaly, adenopathy  Lungs: Respirations unlabored; Neurologic: Alert and oriented; speech intact; face symmetric; uses cane; CNII-XII intact without focal deficit   Assessment:  1. Anemia, unspecified type   2. Nausea and vomiting, unspecified vomiting type   3. Pyogenic granuloma   4. Dysfunction of both eustachian tubes     Plan:  Update labs; keep planned follow up with nephrology for next week; ? Etiology; will update labs- may need to have patient discuss alternative to Rybelsus with her endocrinologist; Refer to dermatology; Trial of  Flonase;  This visit occurred during the SARS-CoV-2 public health emergency.  Safety protocols were in place, including screening questions prior to the visit, additional usage of staff PPE, and extensive cleaning of exam room while observing appropriate contact time as indicated for disinfecting solutions.    No follow-ups on file.  Orders Placed This Encounter  Procedures   CBC with Differential/Platelet   Iron, TIBC and Ferritin Panel   Comp Met (CMET)   Amylase   Lipase   Ambulatory referral to Dermatology    Referral Priority:   Routine  Referral Type:   Consultation    Referral Reason:   Specialty Services Required    Requested Specialty:   Dermatology    Number of Visits Requested:   1    Requested Prescriptions   Signed Prescriptions Disp Refills   fluticasone (FLONASE) 50 MCG/ACT nasal spray 16 g 6    Sig: Place 2 sprays into both nostrils daily.

## 2021-06-13 NOTE — Telephone Encounter (Signed)
Lori Mclaughlin has completed work accommodation paperwork and pt needs to sign her consent. Form placed up front at pt pick up under G.   Pt notified and will come to office to sign on Monday.

## 2021-06-14 LAB — IRON,TIBC AND FERRITIN PANEL
%SAT: 12 % (calc) — ABNORMAL LOW (ref 16–45)
Ferritin: 39 ng/mL (ref 16–288)
Iron: 32 ug/dL — ABNORMAL LOW (ref 45–160)
TIBC: 270 mcg/dL (calc) (ref 250–450)

## 2021-06-24 NOTE — Telephone Encounter (Signed)
Forms have been signed by pt and it now has been faxed to [employer] atten: Valene Bors @ [phone] with confirmation.   Forms have been placed in scan.

## 2021-06-29 ENCOUNTER — Other Ambulatory Visit: Payer: Self-pay | Admitting: Cardiovascular Disease

## 2021-07-01 ENCOUNTER — Encounter: Payer: Self-pay | Admitting: Cardiology

## 2021-07-07 ENCOUNTER — Telehealth: Payer: Self-pay | Admitting: Family

## 2021-07-07 ENCOUNTER — Encounter: Payer: Self-pay | Admitting: Family

## 2021-07-07 NOTE — Telephone Encounter (Signed)
[employer] called and stated they were from pt employer. They received the work accomodation form stating pt needs to work from  home. It stated that the pts. Limitations will last indefinitely. They want to see does they mean she will never be able to come back into the office?  [employer] contact: [phone]

## 2021-07-08 NOTE — Telephone Encounter (Signed)
I have called the [employer] back and relayed the message to and she stated understanding.

## 2021-07-15 ENCOUNTER — Encounter: Payer: Self-pay | Admitting: Pharmacist Clinician (PhC)/ Clinical Pharmacy Specialist

## 2021-07-15 MED ORDER — LOSARTAN POTASSIUM 25 MG PO TABS: 25.0000 mg | ORAL_TABLET | Freq: Every day | ORAL | 3 refills | Status: DC

## 2021-07-25 ENCOUNTER — Ambulatory Visit: Payer: 59 | Admitting: Family

## 2021-07-31 ENCOUNTER — Ambulatory Visit: Payer: 59

## 2021-07-31 NOTE — Progress Notes (Incomplete)
Patient ID: Lori Mclaughlin                 DOB: Dec 20, 1960                      MRN: [id number]     HPI: Lori Mclaughlin is a 61 y.o. female referred by Dr. Oval Linsey to HTN clinic. PMH is significant for HTN, CHF, DM, HLD, and CKD  Current HTN meds:  Hydralazine 100mg  TID Losartan 25mg  daily Carvedilol 6.25mg  BID Clonidine 0.2mg  BID  Previously tried:  Lisinopril (headache) BP goal:   Family History:   Social History:   Diet:   Exercise:   Home BP readings:   Wt Readings from Last 3 Encounters:  06/13/21 266 lb 6.4 oz (120.8 kg)  05/22/21 266 lb 6.4 oz (120.8 kg)  05/08/21 266 lb 3.2 oz (120.7 kg)   BP Readings from Last 3 Encounters:  06/13/21 (!) 150/90  05/22/21 (!) 154/96  05/08/21 (!) 194/120   Pulse Readings from Last 3 Encounters:  06/13/21 79  05/22/21 69  05/08/21 (!) 107    Renal function: CrCl cannot be calculated (Patient's most recent lab result is older than the maximum 21 days allowed.).  Past Medical History:  Diagnosis Date   Allergic rhinitis    Asthma    Atypical chest pain    a. 03/2014: normal nuclear stress test.   Chronic diastolic CHF (congestive heart failure) ([place])    a. Dx 03/2014 with echo - moderate LVH, EF 0000000, grade 1 diastolic dysfunction, mildly dilated LA.   CKD (chronic kidney disease), stage II    Diabetes mellitus type II    GERD (gastroesophageal reflux disease)    Headache(784.0)    when my bp is up   Hypertension    Hypertensive heart disease    Microcytic anemia    OSA (obstructive sleep apnea)    severe with AHI 77/hr with nocturnal hypoxemia   Osteoarthritis     Current Outpatient Medications on File Prior to Visit  Medication Sig Dispense Refill   allopurinol (ZYLOPRIM) 300 MG tablet TAKE 1 TABLET BY MOUTH  DAILY (Patient taking differently: Take 300 mg by mouth daily.) 90 tablet 3   atorvastatin (LIPITOR) 20 MG tablet Take 1 tablet (20 mg total) by mouth at bedtime. 90 tablet 3    carvedilol (COREG) 6.25 MG tablet Take 1 tablet (6.25 mg total) by mouth 2 (two) times daily with a meal. 180 tablet 3   cloNIDine (CATAPRES) 0.2 MG tablet Take 1 tablet (0.2 mg total) by mouth 2 (two) times daily. 60 tablet 3   Continuous Blood Gluc Receiver (FREESTYLE LIBRE 2 READER) DEVI USE ONCE AS DIRECTED 1 each 0   Continuous Blood Gluc Sensor (FREESTYLE LIBRE 2 SENSOR) MISC 1 Device by Does not apply route every 14 (fourteen) days. 6 each 3   escitalopram (LEXAPRO) 10 MG tablet Take 1 tablet (10 mg total) by mouth daily. 90 tablet 0   fluticasone (FLONASE) 50 MCG/ACT nasal spray Place 2 sprays into both nostrils daily. 16 g 6   furosemide (LASIX) 40 MG tablet Take 1 tablet (40 mg total) by mouth every three (3) days as needed for fluid or edema. 30 tablet 0   hydrALAZINE (APRESOLINE) 100 MG tablet TAKE 1 TABLET(100 MG) BY MOUTH THREE TIMES DAILY 90 tablet 3   insulin glargine, 2 Unit Dial, (TOUJEO MAX SOLOSTAR) 300 UNIT/ML Solostar Pen Inject 64 Units into the skin every morning. 24  mL 3   losartan (COZAAR) 25 MG tablet Take 1 tablet (25 mg total) by mouth daily. 90 tablet 3   Semaglutide (RYBELSUS) 14 MG TABS Take 14 mg by mouth daily. 90 tablet 3   No current facility-administered medications on file prior to visit.    Allergies  Allergen Reactions   Lisinopril Other (See Comments)    Headache   Latex Itching and Rash     Assessment/Plan:  1. Hypertension -

## 2021-08-22 ENCOUNTER — Ambulatory Visit (INDEPENDENT_AMBULATORY_CARE_PROVIDER_SITE_OTHER): Payer: 59 | Admitting: Pharmacist Clinician (PhC)/ Clinical Pharmacy Specialist

## 2021-08-22 ENCOUNTER — Encounter: Payer: Self-pay | Admitting: Pharmacist Clinician (PhC)/ Clinical Pharmacy Specialist

## 2021-08-22 ENCOUNTER — Other Ambulatory Visit: Payer: Self-pay

## 2021-08-22 DIAGNOSIS — I10 Essential (primary) hypertension: Secondary | ICD-10-CM | POA: Diagnosis not present

## 2021-08-22 MED ORDER — CLONIDINE HCL 0.2 MG PO TABS: 0.2000 mg | ORAL_TABLET | Freq: Three times a day (TID) | ORAL | 2 refills | Status: DC

## 2021-08-22 MED ORDER — CLONIDINE HCL 0.2 MG PO TABS: 0.2000 mg | ORAL_TABLET | Freq: Three times a day (TID) | ORAL | 1 refills | Status: DC

## 2021-08-22 MED ORDER — CLONIDINE HCL 0.2 MG PO TABS: 0.2000 mg | ORAL_TABLET | Freq: Two times a day (BID) | ORAL | 1 refills | Status: DC

## 2021-08-22 NOTE — Patient Instructions (Signed)
Return for a a follow up appointment February 28 at 11 am  Check your blood pressure at home daily and keep record of the readings.  Take your BP meds as follows:  Increase clonidine to 0.2 mg three times daily  Talk to nephrologist Monday about potentially switching losartan to olmesartan 20 mg.   We would also like to consider spironolactone low dose if renal function stays stable.   Bring all of your meds, your BP cuff and your record of home blood pressures to your next appointment.  Exercise as youre able, try to walk approximately 30 minutes per day.  Keep salt intake to a minimum, especially watch canned and prepared boxed foods.  Eat more fresh fruits and vegetables and fewer canned items.  Avoid eating in fast food restaurants.    HOW TO TAKE YOUR BLOOD PRESSURE: Rest 5 minutes before taking your blood pressure.  Dont smoke or drink caffeinated beverages for at least 30 minutes before. Take your blood pressure before (not after) you eat. Sit comfortably with your back supported and both feet on the floor (dont cross your legs). Elevate your arm to heart level on a table or a desk. Use the proper sized cuff. It should fit smoothly and snugly around your bare upper arm. There should be enough room to slip a fingertip under the cuff. The bottom edge of the cuff should be 1 inch above the crease of the elbow. Ideally, take 3 measurements at one sitting and record the average.

## 2021-08-22 NOTE — Assessment & Plan Note (Signed)
Her BP today was 156/90 and states her pressures at home are still running in the 150-160s/90s. We increased her clonidine 0.2mg  from twice daily to three times daily. We have sent in her new clonidine 0.2mg  three times daily prescription to her mail order pharmacy. With nephrology following her, we have limited options due to her renal function. She will see nephrology on Monday 08/25/2021 and will bring the after visit summary from our appointment with her. We have made suggestions asking if we could switch losartan to olmesartan or if we could start spironolactone to get better blood pressure control. Her HR today was 64, so we did not increase the carvedilol. She will return on February 28th.

## 2021-08-22 NOTE — Progress Notes (Signed)
08/22/2021 Lori Mclaughlin 1961/02/16 [id number]   HPI:  Lori Mclaughlin is a 61 y/o female with a history of hypertension, CHF, CKD, diabetes, and sleep apnea presenting today for hypertension follow-up. She was referred to the hypertension clinic by Dr. Radford Pax but before being seen by Dr. Oval Linsey was hospitalized for a CHF exacerbation. Upon discharge from this admission, she had an AKI and her ARB was discontinued. She had undergone a sleep study and had her CPAP machine ordered in December. She was last seen by the pharmacist on 05/22/2021, where her blood pressure was 154/96 and HR 69. She reported a dry cough over the last couple of weeks when her nephrologist started losartan for her. She was wondering if the cough was medication related, which losartan can cause cough. At this visit her hydralazine was increased to 100 mg TID at this time and she was going to contact her nephrologist about switching her losartan to olmesartan or valsartan at their discretion, as they do not cause cough and are more potent at lowering blood pressure. She is limited in blood pressure medications due to renal function. Her last ECHO was on 03/08/2021 which showed Grade II diastolic dysfunction and LVEF 60-65%.   Says she is feeling wonderful today. No side effects from increase in hydralazine. Started on her CPAP last night for the first time and says she slept great. Reports she does have significant swelling in her legs/feet, uses her furosemide as directed but says it does not work as well as it used to.   PMH: CHF (chronic diastolic): losartan 25mg  daily, carvedilol 6.25mg  BID CKD: SCr 1.76, GFR 30.72 (06/13/2021) Diabetes: [place], (A1c 9.1 03/08/2021) OSA: started CPAP last night Hypertension: seen below  Blood pressure goal < 130/80   Current Medications: Carvedilol 6.25 mg BID  Hydralazine 100 mg TID  Clonidine 0.2 mg BID Losartan 25 mg daily   Family Hx:  Father had MI in 94s; both parents with  hypertension; mother died in her 62s; siblings are healthy; two daughters without heart issues   Social Hx: No alcohol, no tobacco, some tea, no coffee   Diet: only occasional fried foods, no added salt; eats regular veggies - fresh, frozen and canned (does rinse canned).  Exercise: Wait list for PREP, no other formal exercise   Home BP Readings: Did not bring any in, checks sometimes  Mostly sees 150-160s/90s  In Clinic Readings: 156/90 HR 64  Intolerances: Lisinopril - headache Hydralazine > 50mg  caused diarrhea/cramping  Latex - itching, rash   Labs: 06/13/2021: Na 138, K 4.4, Glu 294, BUN 22, SCr 1.76, GFR 30.72  03/08/2021: TC 137, TG 68, HDL 48, LDL 75  03/08/2021: A1c 9.1    Wt Readings from Last 3 Encounters:  06/13/21 266 lb 6.4 oz (120.8 kg)  05/22/21 266 lb 6.4 oz (120.8 kg)  05/08/21 266 lb 3.2 oz (120.7 kg)   BP Readings from Last 3 Encounters:  08/22/21 (!) 156/90  06/13/21 (!) 150/90  05/22/21 (!) 154/96   Pulse Readings from Last 3 Encounters:  08/22/21 64  06/13/21 79  05/22/21 69    Current Outpatient Medications  Medication Sig Dispense Refill   allopurinol (ZYLOPRIM) 300 MG tablet TAKE 1 TABLET BY MOUTH  DAILY (Patient taking differently: Take 300 mg by mouth daily.) 90 tablet 3   atorvastatin (LIPITOR) 20 MG tablet Take 1 tablet (20 mg total) by mouth at bedtime. 90 tablet 3   carvedilol (COREG) 6.25 MG tablet Take 1 tablet (  6.25 mg total) by mouth 2 (two) times daily with a meal. 180 tablet 3   cloNIDine (CATAPRES) 0.2 MG tablet Take 1 tablet (0.2 mg total) by mouth 2 (two) times daily. 30 tablet 1   Continuous Blood Gluc Receiver (FREESTYLE LIBRE 2 READER) DEVI USE ONCE AS DIRECTED 1 each 0   Continuous Blood Gluc Sensor (FREESTYLE LIBRE 2 SENSOR) MISC 1 Device by Does not apply route every 14 (fourteen) days. 6 each 3   fluticasone (FLONASE) 50 MCG/ACT nasal spray Place 2 sprays into both nostrils daily. 16 g 6   furosemide (LASIX) 40 MG  tablet Take 1 tablet (40 mg total) by mouth every three (3) days as needed for fluid or edema. 30 tablet 0   hydrALAZINE (APRESOLINE) 100 MG tablet TAKE 1 TABLET(100 MG) BY MOUTH THREE TIMES DAILY 90 tablet 3   insulin glargine, 2 Unit Dial, (TOUJEO MAX SOLOSTAR) 300 UNIT/ML Solostar Pen Inject 64 Units into the skin every morning. 24 mL 3   losartan (COZAAR) 25 MG tablet Take 1 tablet (25 mg total) by mouth daily. 90 tablet 3   escitalopram (LEXAPRO) 10 MG tablet Take 1 tablet (10 mg total) by mouth daily. (Patient not taking: Reported on 08/22/2021) 90 tablet 0   Semaglutide (RYBELSUS) 14 MG TABS Take 14 mg by mouth daily. (Patient not taking: Reported on 08/22/2021) 90 tablet 3   No current facility-administered medications for this visit.    Allergies  Allergen Reactions   Lisinopril Other (See Comments)    Headache   Latex Itching and Rash    Past Medical History:  Diagnosis Date   Allergic rhinitis    Asthma    Atypical chest pain    a. 03/2014: normal nuclear stress test.   Chronic diastolic CHF (congestive heart failure) ([place])    a. Dx 03/2014 with echo - moderate LVH, EF 0000000, grade 1 diastolic dysfunction, mildly dilated LA.   CKD (chronic kidney disease), stage II    Diabetes mellitus type II    GERD (gastroesophageal reflux disease)    Headache(784.0)    when my bp is up   Hypertension    Hypertensive heart disease    Microcytic anemia    OSA (obstructive sleep apnea)    severe with AHI 77/hr with nocturnal hypoxemia   Osteoarthritis     Blood pressure (!) 156/90, pulse 64, height 5' (1.524 m), last menstrual period 02/23/2012.  Essential hypertension, malignant Her BP today was 156/90 and states her pressures at home are still running in the 150-160s/90s. We increased her clonidine 0.2mg  from twice daily to three times daily. We have sent in her new clonidine 0.2mg  three times daily prescription to her mail order pharmacy. With nephrology following her, we have  limited options due to her renal function. She will see nephrology on Monday 08/25/2021 and will bring the after visit summary from our appointment with her. We have made suggestions asking if we could switch losartan to olmesartan or if we could start spironolactone to get better blood pressure control. Her HR today was 64, so we did not increase the carvedilol. She will return on February 28th.  Glee Arvin, PharmD Candidate [employer], Class of 2023  I was with student and patient for entire appointment and agree with above assessment and plan.   Tommy Medal PharmD CPP [facility] [address] [place], [place] [address] [phone]

## 2021-09-23 ENCOUNTER — Ambulatory Visit: Payer: 59

## 2021-09-27 ENCOUNTER — Other Ambulatory Visit: Payer: Self-pay | Admitting: Family

## 2021-10-16 ENCOUNTER — Encounter: Payer: Self-pay | Admitting: Cardiology

## 2021-10-16 ENCOUNTER — Telehealth (INDEPENDENT_AMBULATORY_CARE_PROVIDER_SITE_OTHER): Payer: 59 | Admitting: Cardiology

## 2021-10-16 ENCOUNTER — Other Ambulatory Visit: Payer: Self-pay

## 2021-10-16 VITALS — Ht 60.0 in | Wt 264.0 lb

## 2021-10-16 DIAGNOSIS — I5032 Chronic diastolic (congestive) heart failure: Secondary | ICD-10-CM

## 2021-10-16 DIAGNOSIS — G4733 Obstructive sleep apnea (adult) (pediatric): Secondary | ICD-10-CM

## 2021-10-16 DIAGNOSIS — I10 Essential (primary) hypertension: Secondary | ICD-10-CM | POA: Diagnosis not present

## 2021-10-16 NOTE — Progress Notes (Signed)
? ?Virtual Visit via Video Note  ? ?This visit type was conducted due to national recommendations for restrictions regarding the COVID-19 Pandemic (e.g. social distancing) in an effort to limit this patient's exposure and mitigate transmission in our community.  Due to her co-morbid illnesses, this patient is at least at moderate risk for complications without adequate follow up.  This format is felt to be most appropriate for this patient at this time.  All issues noted in this document were discussed and addressed.  A limited physical exam was performed with this format.  Please refer to the patient's chart for her consent to telehealth for [facility]. ? ?  ?Date:  10/16/2021  ? ?ID:  Lori Mclaughlin, DOB 1961-06-11, MRN [id number] ?The patient was identified using 2 identifiers. ? ?Patient Location: Home ?Provider Location: Home Office ? ?Cardiology Consult Note   ? ?Date:  10/16/2021  ? ?ID:  Lori Mclaughlin, DOB 02/15/1961, MRN [id number] ? ?PCP:  Marrian Salvage, [place]  ?Cardiologist:  Fransico Him, MD  ? ?Chief Complaint  ?Patient presents with  ? Congestive Heart Failure  ? Hypertension  ? Sleep Apnea  ? ? ?History of Present Illness:  ?Lori Mclaughlin is a 61 y.o. female  with a hx of asthma, atypical CP with normal nuclear stress test 123456, chronic diastolic CHF, CKD stage 2, DM2, GERD and HTN.  ? ?She was intolerant to telmisartan due to chronic HA on ARBs.  Renal dopplers showed no evidence of RAS.  Plasma renin/Aldo/PRA were normal, TSH was normal.  She also underwent sleep study which showed severe OSA with an AHI of 77/hr and nocturnal hypoxemia with O2 sats as low as 80%.  She underwent CPAP titration on 05/04/2021 and was placed on CPAP at 10 cm H2O.  She is now here for follow-up. ? ?She is doing well with her CPAP device and thinks that she has gotten used to it.  She tolerates the full face mask and feels the pressure is adequate.  Since going on CPAP she feels rested in  the am and has no significant daytime sleepiness.  She denies any significant mouth or nasal dryness or nasal congestion.  She does not think that he snores.    ? ?She is here today for followup and is doing well.  She denies any chest pain or pressure, SOB, DOE, PND, orthopnea, LE edema, dizziness, palpitations or syncope. He is compliant with her meds and is tolerating meds with no SE.    ? ?Past Medical History:  ?Diagnosis Date  ? Allergic rhinitis   ? Asthma   ? Atypical chest pain   ? a. 03/2014: normal nuclear stress test.  ? Chronic diastolic CHF (congestive heart failure) ([place])   ? a. Dx 03/2014 with echo - moderate LVH, EF 0000000, grade 1 diastolic dysfunction, mildly dilated LA.  ? CKD (chronic kidney disease), stage II   ? Diabetes mellitus type II   ? GERD (gastroesophageal reflux disease)   ? Headache(784.0)   ? when my bp is up  ? Hypertension   ? Hypertensive heart disease   ? Microcytic anemia   ? OSA (obstructive sleep apnea)   ? severe with AHI 77/hr with nocturnal hypoxemia  ? Osteoarthritis   ? ? ?Past Surgical History:  ?Procedure Laterality Date  ? SHOULDER ARTHROSCOPY Bilateral   ? TUBAL LIGATION    ? ? ?Current Medications: ?Current Meds  ?Medication Sig  ? allopurinol (ZYLOPRIM) 300 MG tablet TAKE 1  TABLET BY MOUTH  DAILY  ? atorvastatin (LIPITOR) 20 MG tablet Take 1 tablet (20 mg total) by mouth at bedtime.  ? carvedilol (COREG) 6.25 MG tablet Take 1 tablet (6.25 mg total) by mouth 2 (two) times daily with a meal.  ? Continuous Blood Gluc Receiver (FREESTYLE LIBRE 2 READER) DEVI USE ONCE AS DIRECTED  ? Continuous Blood Gluc Sensor (FREESTYLE LIBRE 2 SENSOR) MISC 1 Device by Does not apply route every 14 (fourteen) days.  ? FARXIGA 10 MG TABS tablet Take 10 mg by mouth daily.  ? ferrous sulfate 325 (65 FE) MG EC tablet Take 325 mg by mouth 3 (three) times daily with meals.  ? fluticasone (FLONASE) 50 MCG/ACT nasal spray Place 2 sprays into both nostrils daily. (Patient taking differently:  Place 2 sprays into both nostrils daily as needed.)  ? furosemide (LASIX) 40 MG tablet Take 1 tablet (40 mg total) by mouth every three (3) days as needed for fluid or edema. (Patient taking differently: Take 40 mg by mouth 3 (three) times a week.)  ? hydrALAZINE (APRESOLINE) 100 MG tablet TAKE 1 TABLET(100 MG) BY MOUTH THREE TIMES DAILY  ? insulin glargine, 2 Unit Dial, (TOUJEO MAX SOLOSTAR) 300 UNIT/ML Solostar Pen Inject 64 Units into the skin every morning.  ? losartan (COZAAR) 25 MG tablet Take 1 tablet (25 mg total) by mouth daily.  ? Vitamin D, Ergocalciferol, (DRISDOL) 1.25 MG (50000 UNIT) CAPS capsule Take 50,000 Units by mouth once a week.  ? ? ?Allergies:   Lisinopril and Latex  ? ?Social History  ? ?Socioeconomic History  ? Marital status: Divorced  ?  Spouse name: Not on file  ? Number of children: Not on file  ? Years of education: Not on file  ? Highest education level: Not on file  ?Occupational History  ? Not on file  ?Tobacco Use  ? Smoking status: Former  ?  Types: Cigarettes  ?  Quit date: 08/29/2010  ?  Years since quitting: 11.1  ? Smokeless tobacco: Never  ?Vaping Use  ? Vaping Use: Never used  ?Substance and Sexual Activity  ? Alcohol use: No  ?  Alcohol/week: 5.0 standard drinks  ?  Types: 5 Glasses of wine per week  ? Drug use: No  ? Sexual activity: Yes  ?  Birth control/protection: Surgical  ?Other Topics Concern  ? Not on file  ?Social History Narrative  ? Regular Exercise- yes  ? ?Social Determinants of Health  ? ?Financial Resource Strain: Not on file  ?Food Insecurity: Not on file  ?Transportation Needs: Not on file  ?Physical Activity: Not on file  ?Stress: Not on file  ?Social Connections: Not on file  ?  ? ?Family History:  The patient's family history includes Cancer in her father and another family member; Diabetes in an other family member; Heart attack in her mother; Hypertension in her brother, maternal grandfather, maternal grandmother, mother, and another family member; Stroke  in an other family member.  ? ?ROS:   ?Please see the history of present illness.    ?ROS All other systems reviewed and are negative. ? ?   ? View : No data to display.  ?  ?  ?  ? ? ? ?PHYSICAL EXAM:   ?VS:  Ht 5' (1.524 m)   Wt 264 lb (119.7 kg)   LMP 02/23/2012   BMI 51.56 kg/m?    ?Well nourished, well developed female in no acute distress. ?Well appearing, alert and conversant, regular  work of breathing,  good skin color  ?Eyes- anicteric ?mouth- oral mucosa is pink  ?neuro- grossly intact ?skin- no apparent rash or lesions or cyanosis  ?Wt Readings from Last 3 Encounters:  ?10/16/21 264 lb (119.7 kg)  ?06/13/21 266 lb 6.4 oz (120.8 kg)  ?05/22/21 266 lb 6.4 oz (120.8 kg)  ?  ? ? ?Studies/Labs Reviewed:  ? ?EKG:  EKG is not ordered today  ? ?Recent Labs: ?03/07/2021: B Natriuretic Peptide 631.5; Magnesium 1.6 ?03/08/2021: TSH 2.514 ?06/13/2021: ALT 9; BUN 22; Creatinine, Ser 1.76; Hemoglobin 9.5; Platelets 249.0; Potassium 4.4; Sodium 138  ? ?Lipid Panel ?   ?Component Value Date/Time  ? CHOL 137 03/08/2021 0326  ? TRIG 68 03/08/2021 0326  ? HDL 48 03/08/2021 0326  ? CHOLHDL 2.9 03/08/2021 0326  ? VLDL 14 03/08/2021 0326  ? [place] 75 03/08/2021 0326  ? ? ?Additional studies/ records that were reviewed today include:  ?OV notes from PCP, Sleep study, PAP download, renal duplex, labs ? ?ASSESSMENT:   ? ?1. Chronic diastolic CHF (congestive heart failure), NYHA class 2 ([place])   ?2. Essential hypertension, malignant   ?3. Hyperlipidemia with target LDL less than 70   ? ? ? ?PLAN:  ?In order of problems listed above: ? ?1.  Chronic diastolic CHF ?-Her weight is stable and she has not had any shortness of breath or lower extremity edema ?-Continue prescription drug management with Lasix 40 mg three times weekly with as needed refills ? ?2.  HTN ?-She is followed in advanced hypertensive clinic ?-BP 142/28mmHg this am ?-renal duplex  with no RAS and plasma renin/Aldo and PRA and TSH were normal ?-24 hour urine for  catechols, VMA, metanephrines, dopamine, cortisol were normal ?-Continue prescription drug management with losartan 25 mg daily, hydralazine 100 mg 3 times daily, carvedilol 6.25 mg twice daily with as need

## 2021-10-17 ENCOUNTER — Telehealth: Payer: Self-pay | Admitting: *Deleted

## 2021-10-17 DIAGNOSIS — G4733 Obstructive sleep apnea (adult) (pediatric): Secondary | ICD-10-CM

## 2021-10-17 NOTE — Telephone Encounter (Signed)
[facility] 

## 2021-10-17 NOTE — Telephone Encounter (Signed)
-----   Message from Antonieta Iba, RN sent at 10/16/2021  9:36 AM EDT ----- ?Per Dr. Radford Pax: ?Please order patient new supplies.  ?Thanks! ? ?

## 2021-11-13 ENCOUNTER — Ambulatory Visit: Payer: 59 | Admitting: Family

## 2021-11-13 ENCOUNTER — Encounter: Payer: Self-pay | Admitting: Family

## 2021-11-13 VITALS — BP 150/90 | HR 77 | Temp 98.5°F | Ht 60.0 in | Wt 266.6 lb

## 2021-11-13 DIAGNOSIS — I10 Essential (primary) hypertension: Secondary | ICD-10-CM | POA: Diagnosis not present

## 2021-11-13 DIAGNOSIS — E1122 Type 2 diabetes mellitus with diabetic chronic kidney disease: Secondary | ICD-10-CM | POA: Diagnosis not present

## 2021-11-13 DIAGNOSIS — I5032 Chronic diastolic (congestive) heart failure: Secondary | ICD-10-CM

## 2021-11-13 DIAGNOSIS — N183 Chronic kidney disease, stage 3 unspecified: Secondary | ICD-10-CM

## 2021-11-13 DIAGNOSIS — Z1231 Encounter for screening mammogram for malignant neoplasm of breast: Secondary | ICD-10-CM

## 2021-11-13 DIAGNOSIS — Z1211 Encounter for screening for malignant neoplasm of colon: Secondary | ICD-10-CM

## 2021-11-13 DIAGNOSIS — Z794 Long term (current) use of insulin: Secondary | ICD-10-CM

## 2021-11-13 NOTE — Progress Notes (Signed)
?Lori Mclaughlin is a 61 y.o. female with the following history as recorded in EpicCare:  ?Patient Active Problem List  ? Diagnosis Date Noted  ? Diabetes ([place]) 05/10/2021  ? Hypertensive emergency 03/08/2021  ? Chronic kidney disease (CKD) stage G3b/A1, moderately decreased glomerular filtration rate (GFR) between 30-44 mL/min/1.73 square meter and albuminuria creatinine ratio less than 30 mg/g ([facility]) 03/08/2021  ? Acute on chronic diastolic CHF (congestive heart failure) ([place]) 03/07/2021  ? 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 Myalgia 03/10/2017  ? Screen for colon cancer 06/12/2015  ? Visit for screening mammogram 06/12/2015  ? Hypersomnia 04/11/2014  ? Morbid obesity ([place]) 04/11/2014  ? Bradycardia, drug induced 04/11/2014  ? Chronic diastolic CHF (congestive heart failure), NYHA class 2 ([place]) 03/30/2014  ? Low back pain radiating to left leg 01/26/2012  ? Sleep apnea 11/13/2011  ? Hyperlipidemia with target LDL less than 70 01/14/2011  ? Deficiency anemia 08/05/2009  ? Essential hypertension, malignant 08/05/2009  ? ALLERGIC RHINITIS 08/05/2009  ? GERD 08/05/2009  ? OSTEOARTHRITIS 08/05/2009  ?  ?Current Outpatient Medications  ?Medication Sig Dispense Refill  ? allopurinol (ZYLOPRIM) 300 MG tablet TAKE 1 TABLET BY MOUTH  DAILY 90 tablet 1  ? carvedilol (COREG) 6.25 MG tablet Take 1 tablet (6.25 mg total) by mouth 2 (two) times daily with a meal. 180 tablet 3  ? Continuous Blood Gluc Receiver (FREESTYLE LIBRE 2 READER) DEVI USE ONCE AS DIRECTED 1 each 0  ? Continuous Blood Gluc Sensor (FREESTYLE LIBRE 2 SENSOR) MISC 1 Device by Does not apply route every 14 (fourteen) days. 6 each 3  ? FARXIGA 10 MG TABS tablet Take 10 mg by mouth daily.    ? ferrous sulfate 325 (65 FE) MG EC tablet Take 325 mg by mouth 3 (three) times daily with meals.    ? fluticasone (FLONASE) 50 MCG/ACT nasal spray Place 2 sprays into both nostrils daily. (Patient taking differently: Place 2 sprays into both nostrils daily as needed.)  16 g 6  ? furosemide (LASIX) 40 MG tablet Take 1 tablet (40 mg total) by mouth every three (3) days as needed for fluid or edema. (Patient taking differently: Take 40 mg by mouth 3 (three) times a week.) 30 tablet 0  ? hydrALAZINE (APRESOLINE) 100 MG tablet TAKE 1 TABLET(100 MG) BY MOUTH THREE TIMES DAILY 90 tablet 3  ? insulin glargine, 2 Unit Dial, (TOUJEO MAX SOLOSTAR) 300 UNIT/ML Solostar Pen Inject 64 Units into the skin every morning. 24 mL 3  ? losartan (COZAAR) 25 MG tablet Take 1 tablet (25 mg total) by mouth daily. 90 tablet 3  ? Vitamin D, Ergocalciferol, (DRISDOL) 1.25 MG (50000 UNIT) CAPS capsule Take 50,000 Units by mouth once a week.    ? atorvastatin (LIPITOR) 20 MG tablet Take 1 tablet (20 mg total) by mouth at bedtime. (Patient not taking: Reported on 11/13/2021) 90 tablet 3  ? ?No current facility-administered medications for this visit.  ?  ?Allergies: Lisinopril and Latex  ?Past Medical History:  ?Diagnosis Date  ? Allergic rhinitis   ? Asthma   ? Atypical chest pain   ? a. 03/2014: normal nuclear stress test.  ? Chronic diastolic CHF (congestive heart failure) ([place])   ? a. Dx 03/2014 with echo - moderate LVH, EF 0000000, grade 1 diastolic dysfunction, mildly dilated LA.  ? CKD (chronic kidney disease), stage II   ? Diabetes mellitus type II   ? GERD (gastroesophageal reflux disease)   ? Headache(784.0)   ? when  my bp is up  ? Hypertension   ? Hypertensive heart disease   ? Microcytic anemia   ? OSA (obstructive sleep apnea)   ? severe with AHI 77/hr with nocturnal hypoxemia  ? Osteoarthritis   ?  ?Past Surgical History:  ?Procedure Laterality Date  ? SHOULDER ARTHROSCOPY Bilateral   ? TUBAL LIGATION    ?  ?Family History  ?Problem Relation Age of Onset  ? Heart attack Mother   ? Hypertension Mother   ? Cancer Father   ? Hypertension Brother   ? Hypertension Maternal Grandmother   ? Hypertension Maternal Grandfather   ? Diabetes Other   ? Hypertension Other   ? Stroke Other   ? Cancer Other   ?      colon; prostate  ? Kidney disease Neg Hx   ? Alcohol abuse Neg Hx   ? Asthma Neg Hx   ? COPD Neg Hx   ? Depression Neg Hx   ? Drug abuse Neg Hx   ? Early death Neg Hx   ? Hearing loss Neg Hx   ? Heart disease Neg Hx   ? Hyperlipidemia Neg Hx   ?  ?Social History  ? ?Tobacco Use  ? Smoking status: Former  ?  Types: Cigarettes  ?  Quit date: 08/29/2010  ?  Years since quitting: 11.2  ? Smokeless tobacco: Never  ?Substance Use Topics  ? Alcohol use: No  ?  Alcohol/week: 5.0 standard drinks  ?  Types: 5 Glasses of wine per week  ?  ?Subjective:  ?Accompanied by daughter today; requesting updated workplace accommodations- requesting to continue being allowed to work from home due to chronic health needs; ?Is feeling much better since being placed on CPAP;  ?Continuing to work with cardiology, nephrology and endocrinology;  ? ? ? ?Objective:  ?Vitals:  ? 11/13/21 1041  ?BP: (!) 150/90  ?Pulse: 77  ?Temp: 98.5 ?F (36.9 ?C)  ?TempSrc: Oral  ?SpO2: 97%  ?Weight: 266 lb 9.6 oz (120.9 kg)  ?Height: 5' (1.524 m)  ?  ?General: Well developed, well nourished, in no acute distress  ?Skin : Warm and dry.  ?Head: Normocephalic and atraumatic  ?Lungs: Respirations unlabored; clear to auscultation bilaterally without wheeze, rales, rhonchi  ?CVS exam: normal rate and regular rhythm.  ?Neurologic: Alert and oriented; speech intact; face symmetrical; uses cane stability; CNII-XII intact without focal deficit  ? ?Assessment:  ?1. Screening mammogram, encounter for   ?2. Encounter for screening colonoscopy   ?3. Stage 3 chronic kidney disease, unspecified whether stage 3a or 3b CKD ([place])   ?4. Essential hypertension, malignant   ?5. Chronic diastolic CHF (congestive heart failure), NYHA class 2 ([place])   ?6. Type 2 diabetes mellitus with stage 3 chronic kidney disease, with long-term current use of insulin, unspecified whether stage 3a or 3b CKD ([place])   ?  ?Plan:  ?Order updated; ?Order updated; FH of colon cancer; ?Continue with  nephrology; ?Continue with cardiology; ?Continue with cardiology;  ?Encouraged to schedule with endocrinology;  ? ?This visit occurred during the SARS-CoV-2 public health emergency.  Safety protocols were in place, including screening questions prior to the visit, additional usage of staff PPE, and extensive cleaning of exam room while observing appropriate contact time as indicated for disinfecting solutions.  ? ? ?No follow-ups on file.  ?Orders Placed This Encounter  ?Procedures  ? MM Digital Screening  ?  Standing Status:   Future  ?  Standing Expiration Date:   11/14/2022  ?  Order Specific Question:   Reason for Exam (SYMPTOM  OR DIAGNOSIS REQUIRED)  ?  Answer:   screening mammogram  ?  Order Specific Question:   Preferred imaging location?  ?  Answer:   [facility]  ? Ambulatory referral to Gastroenterology  ?  Referral Priority:   Routine  ?  Referral Type:   Consultation  ?  Referral Reason:   Specialty Services Required  ?  Number of Visits Requested:   1  ?  ?Requested Prescriptions  ? ? No prescriptions requested or ordered in this encounter  ?  ? ?

## 2022-01-28 ENCOUNTER — Telehealth: Payer: Self-pay | Admitting: *Deleted

## 2022-01-28 NOTE — Telephone Encounter (Signed)
Pt called stating that her endocrinologist retired 2 months ago and she didn't know about it.  She is going to need her diabetic meds refilled before she can get in with a new one.  Are you okay with taking care of this until then? Please advise.

## 2022-01-29 NOTE — Telephone Encounter (Signed)
Pt notified of provider instructions. 

## 2022-01-30 ENCOUNTER — Other Ambulatory Visit: Payer: Self-pay | Admitting: Family

## 2022-01-30 ENCOUNTER — Other Ambulatory Visit: Payer: Self-pay | Admitting: Cardiology

## 2022-02-10 ENCOUNTER — Telehealth: Payer: Self-pay

## 2022-02-10 ENCOUNTER — Telehealth: Payer: Self-pay | Admitting: Family

## 2022-02-10 NOTE — Telephone Encounter (Signed)
Pt stated she has accomodation papers filled out a couple weeks ago, but her job did not receive them and she would like it re-faxed. [phone], Redgie Grayer.

## 2022-02-10 NOTE — Telephone Encounter (Signed)
Talk with pt stated she would get them drop off to [place]

## 2022-02-10 NOTE — Telephone Encounter (Signed)
done

## 2022-02-17 ENCOUNTER — Telehealth: Payer: Self-pay | Admitting: Family

## 2022-02-17 NOTE — Telephone Encounter (Signed)
Regarding the Capabilities and Limitations Form-it's on Lori Mclaughlin's dek  1) She did not sign her part as requested so we cannot send it directly to anyone as she requested. How does she want to handle this?   2) I am not sure what this paperwork is regarding? Is this another form to allow her to work from home full time? It doesn't look like FMLA paperwork- if that's what this is, is she okay with 1  8 hour flare per month.  Is she working a full 8 hour day?

## 2022-02-17 NOTE — Telephone Encounter (Signed)
Patient daughter dropped off form Would like to be called when its ready for pick up & faxed to number on sticky [phone] [facility])   Placed in [place] bin up front

## 2022-02-19 NOTE — Telephone Encounter (Signed)
Called pt and was advised that she need to sign forms  And Pt stated yes its for her to work full time at home. Will come By to pick up forms.

## 2022-03-02 ENCOUNTER — Telehealth: Payer: Self-pay | Admitting: Family

## 2022-03-02 NOTE — Telephone Encounter (Signed)
Forms received.  

## 2022-03-02 NOTE — Telephone Encounter (Signed)
Patient's daughter dropped off [employer] FMLA forms for her mother that need to be filled out. She would like a call back when they are ready to be picked up.  Gwen Her  [phone]

## 2022-03-06 ENCOUNTER — Ambulatory Visit (INDEPENDENT_AMBULATORY_CARE_PROVIDER_SITE_OTHER): Payer: 59 | Admitting: Psychology

## 2022-03-06 DIAGNOSIS — F4321 Adjustment disorder with depressed mood: Secondary | ICD-10-CM | POA: Diagnosis not present

## 2022-03-06 NOTE — Progress Notes (Signed)
[facility] Counselor Initial Adult Exam  Name: Lori Mclaughlin Date: 03/06/2022 MRN: [id number] DOB: [birth year] PCP: Olive Bass, FNP  Time spent: 45 mins  Guardian/Payee:  Pt   Paperwork requested: No   Reason for Visit /Presenting Problem: Pt presents for session via webex video.  Pt grants consent for the session, stating that she is in her home with no one else present.  I shared with pt that I am in my office with no one else here.  Mental Status Exam: Appearance:   Casual     Behavior:  Appropriate  Motor:  Normal  Speech/Language:   Clear and Coherent  Affect:  Appropriate  Mood:  normal  Thought process:  normal  Thought content:    WNL  Sensory/Perceptual disturbances:    WNL  Orientation:  oriented to person, place, and time/date  Attention:  Good  Concentration:  Good  Memory:  WNL  Fund of knowledge:   Good  Insight:    Good  Judgment:   Good  Impulse Control:  Good   Reported Symptoms:  Pt shares that her job continues to be stressful for her; "they let me go part time, but only for 30 days."  She is back at work full time "and it is worse now than it was before I went part time."  Pt also shares that she is struggling with "personal stuff as well."  She shares she sleeps a lot and has had a loss of appetite as well.  Pt shares she does not feel rested after she sleeps, even though she sleeps all night.  Pt shares she does not have any regular contact with friends, either in person or virtually.   Risk Assessment: Danger to Self:  No Self-injurious Behavior: No Danger to Others: No Duty to Warn:no Physical Aggression / Violence:No  Access to Firearms a concern: No  Gang Involvement:No  Patient / guardian was educated about steps to take if suicide or homicide risk level increases between visits: n/a While future psychiatric events cannot be accurately predicted, the patient does not currently require acute inpatient  psychiatric care and does not currently meet [facility] involuntary commitment criteria.  Substance Abuse History: Current substance abuse: No     Past Psychiatric History:   Previous psychological history is significant for depression Outpatient Providers:Saw this therapist one yr ago for 4 sessions History of Psych Hospitalization: No  Psychological Testing:  none    Abuse History:  Victim of: No.,  none    Report needed: No. Victim of Neglect:No. Perpetrator of  none   Witness / Exposure to Domestic Violence: No   Protective Services Involvement: No  Witness to [employer] Violence:  No   Family History:  Family History  Problem Relation Age of Onset   Heart attack Mother    Hypertension Mother    Cancer Father    Hypertension Brother    Hypertension Maternal Grandmother    Hypertension Maternal Grandfather    Diabetes Other    Hypertension Other    Stroke Other    Cancer Other        colon; prostate   Kidney disease Neg Hx    Alcohol abuse Neg Hx    Asthma Neg Hx    COPD Neg Hx    Depression Neg Hx    Drug abuse Neg Hx    Early death Neg Hx    Hearing loss Neg Hx    Heart disease Neg Hx  Hyperlipidemia Neg Hx     Living situation: the patient lives with their family (daughter and grandsons); she gets along well with them. 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  Her mgr has told her she is not meeting their expectations.  Pt does not feel like her job is in danger; "that has not come up yet."  She was working part time for a  month but she had to go back to work full time as of 02/10/22 "because [employer] does not have part time positions.  They said I could go back full time or resign."    Military Service: No   Educational History: Education: some college  Religion/Sprituality/World View: Protestant; watches church on line  Any cultural differences that may affect / interfere with treatment:  not applicable   Recreation/Hobbies: "Sleeping is all I want to do.  I may watch a movie every now and then but I don't do very much for fun."   Stressors: Occupational concerns  ; pt also is concerned that she does not want to take care of herself; "some days I do not even shower and that is just not me."  Strengths: Family  Barriers:  feelings of depression and lack of engagement  Legal History: Pending legal issue / charges: The patient has no significant history of legal issues. History of legal issue / charges:  none  Medical History/Surgical History: reviewed  Pt shares that she is having stability issues and is having to use a cane now.  She has neuropathy in her feet and has trouble walking.  She stays at home because of fear of falling.  Pt shares she has lost about 12 pounds in the past 3 months.   Past Medical History:  Diagnosis Date   Allergic rhinitis    Asthma    Atypical chest pain    a. 03/2014: normal nuclear stress test.   Chronic diastolic CHF (congestive heart failure) ([place])    a. Dx 03/2014 with echo - moderate LVH, EF 0000000, grade 1 diastolic dysfunction, mildly dilated LA.   CKD (chronic kidney disease), stage II    Diabetes mellitus type II    GERD (gastroesophageal reflux disease)    Headache(784.0)    when my bp is up   Hypertension    Hypertensive heart disease    Microcytic anemia    OSA (obstructive sleep apnea)    severe with AHI 77/hr with nocturnal hypoxemia   Osteoarthritis     Past Surgical History:  Procedure Laterality Date   SHOULDER ARTHROSCOPY Bilateral    TUBAL  LIGATION      Medications: Pt is no longer taking Lexapro 10 mg.  She has a PCP, cardiologist, endocrinologist (next appt in Oct), and a nephrologist Current Outpatient Medications  Medication Sig Dispense Refill   allopurinol (ZYLOPRIM) 300 MG tablet TAKE 1 TABLET BY MOUTH DAILY 90 tablet 1   atorvastatin (LIPITOR) 20 MG tablet Take 1 tablet (20 mg total) by mouth at bedtime. (Patient not taking: Reported on 11/13/2021) 90 tablet 3   carvedilol (COREG)  6.25 MG tablet Take 1 tablet (6.25 mg total) by mouth 2 (two) times daily with a meal. 180 tablet 3   Continuous Blood Gluc Receiver (FREESTYLE LIBRE 2 READER) DEVI USE ONCE AS DIRECTED 1 each 0   Continuous Blood Gluc Sensor (FREESTYLE LIBRE 2 SENSOR) MISC 1 Device by Does not apply route every 14 (fourteen) days. 6 each 3   FARXIGA 10 MG TABS tablet Take 10 mg by mouth daily.     ferrous sulfate 325 (65 FE) MG EC tablet Take 325 mg by mouth 3 (three) times daily with meals.     fluticasone (FLONASE) 50 MCG/ACT nasal spray Place 2 sprays into both nostrils daily. (Patient taking differently: Place 2 sprays into both nostrils daily as needed.) 16 g 6   furosemide (LASIX) 40 MG tablet Take 1 tablet (40 mg total) by mouth every three (3) days as needed for fluid or edema. (Patient taking differently: Take 40 mg by mouth 3 (three) times a week.) 30 tablet 0   hydrALAZINE (APRESOLINE) 100 MG tablet TAKE 1 TABLET(100 MG) BY MOUTH THREE TIMES DAILY 90 tablet 3   insulin glargine, 2 Unit Dial, (TOUJEO MAX SOLOSTAR) 300 UNIT/ML Solostar Pen Inject 64 Units into the skin every morning. 24 mL 3   losartan (COZAAR) 25 MG tablet Take 1 tablet (25 mg total) by mouth daily. 90 tablet 3   Vitamin D, Ergocalciferol, (DRISDOL) 1.25 MG (50000 UNIT) CAPS capsule Take 50,000 Units by mouth once a week.     No current facility-administered medications for this visit.    Allergies  Allergen Reactions   Lisinopril Other (See Comments)    Headache   Latex Itching  and Rash    Diagnoses:  Adjustment disorder with depressed mood  Plan of Care: Encouraged pt to think about what self care activities she can engage in between now and our follow up session in 2 wks (03/18/22).   Karie Kirks, [facility]

## 2022-03-12 ENCOUNTER — Telehealth: Payer: Self-pay

## 2022-03-12 NOTE — Telephone Encounter (Signed)
I have faxed the FMLA and work accommodation. Lori Mclaughlin the HR person with work accommodations stated that they can update the form so pt can work from home for 3 years and the intermittent need to go to off visit for at least 1 year. They will let us know when more paperwork needs to be completed. As of today he stated that he got everything he needed.   The information was faxed and received by Lori Mclaughlin rep.    I have called pt and informed her of the message above.   Also pt stated that she is due for a CPE and she has some follow up things she wants to talk to laura about. I have scheduled both appointments so her concerns can be addressed. Pt stated understanding and I have routed the message to provider as FYI.

## 2022-03-17 ENCOUNTER — Ambulatory Visit (INDEPENDENT_AMBULATORY_CARE_PROVIDER_SITE_OTHER): Payer: 59 | Admitting: Family

## 2022-03-17 ENCOUNTER — Telehealth: Payer: Self-pay

## 2022-03-17 VITALS — BP 170/100 | HR 79 | Temp 98.5°F | Ht 60.0 in | Wt 267.0 lb

## 2022-03-17 DIAGNOSIS — Z794 Long term (current) use of insulin: Secondary | ICD-10-CM | POA: Diagnosis not present

## 2022-03-17 DIAGNOSIS — R202 Paresthesia of skin: Secondary | ICD-10-CM | POA: Diagnosis not present

## 2022-03-17 DIAGNOSIS — I10 Essential (primary) hypertension: Secondary | ICD-10-CM

## 2022-03-17 DIAGNOSIS — E1169 Type 2 diabetes mellitus with other specified complication: Secondary | ICD-10-CM

## 2022-03-17 DIAGNOSIS — R200 Anesthesia of skin: Secondary | ICD-10-CM

## 2022-03-17 DIAGNOSIS — Z13220 Encounter for screening for lipoid disorders: Secondary | ICD-10-CM | POA: Diagnosis not present

## 2022-03-17 DIAGNOSIS — F339 Major depressive disorder, recurrent, unspecified: Secondary | ICD-10-CM | POA: Diagnosis not present

## 2022-03-17 DIAGNOSIS — I5032 Chronic diastolic (congestive) heart failure: Secondary | ICD-10-CM

## 2022-03-17 DIAGNOSIS — N183 Chronic kidney disease, stage 3 unspecified: Secondary | ICD-10-CM

## 2022-03-17 MED ORDER — DULOXETINE HCL 30 MG PO CPEP: 30.0000 mg | ORAL_CAPSULE | Freq: Every day | ORAL | 0 refills | Status: DC

## 2022-03-17 NOTE — Patient Instructions (Signed)
Please call your cardiologist/ nephrologist; keep the planned follow up with endocrine;  Try the Cymbalta to help with the depression/ tingling in your feet- please consider seeing the psychiatrist;

## 2022-03-17 NOTE — Progress Notes (Signed)
Lori Mclaughlin is a 61 y.o. female with the following history as recorded in EpicCare:  Patient Active Problem List   Diagnosis Date Noted   Diabetes ([place]) 05/10/2021   Hypertensive emergency 03/08/2021   Chronic kidney disease (CKD) stage G3b/A1, moderately decreased glomerular filtration rate (GFR) between 30-44 mL/min/1.73 square meter and albuminuria creatinine ratio less than 30 mg/g (HCC) 03/08/2021   Acute on chronic diastolic CHF (congestive heart failure) ([place]) 03/07/2021   Thiamine deficiency 09/19/2017   Myalgia 03/10/2017   Screen for colon cancer 06/12/2015   Visit for screening mammogram 06/12/2015   Hypersomnia 04/11/2014   Morbid obesity ([place]) 04/11/2014   Bradycardia, drug induced 04/11/2014   Chronic diastolic CHF (congestive heart failure), NYHA class 2 ([place]) 03/30/2014   Low back pain radiating to left leg 01/26/2012   Sleep apnea 11/13/2011   Hyperlipidemia with target LDL less than 70 01/14/2011   Deficiency anemia 08/05/2009   Essential hypertension, malignant 08/05/2009   ALLERGIC RHINITIS 08/05/2009   GERD 08/05/2009   OSTEOARTHRITIS 08/05/2009    Current Outpatient Medications  Medication Sig Dispense Refill   allopurinol (ZYLOPRIM) 300 MG tablet TAKE 1 TABLET BY MOUTH DAILY 90 tablet 1   atorvastatin (LIPITOR) 20 MG tablet Take 1 tablet (20 mg total) by mouth at bedtime. 90 tablet 3   carvedilol (COREG) 6.25 MG tablet Take 1 tablet (6.25 mg total) by mouth 2 (two) times daily with a meal. 180 tablet 3   DULoxetine (CYMBALTA) 30 MG capsule Take 1 capsule (30 mg total) by mouth daily. 90 capsule 0   FARXIGA 10 MG TABS tablet Take 10 mg by mouth daily.     fluticasone (FLONASE) 50 MCG/ACT nasal spray Place 2 sprays into both nostrils daily. (Patient taking differently: Place 2 sprays into both nostrils daily as needed.) 16 g 6   furosemide (LASIX) 40 MG tablet Take 1 tablet (40 mg total) by mouth every three (3) days as needed for fluid or edema.  (Patient taking differently: Take 40 mg by mouth 3 (three) times a week.) 30 tablet 0   hydrALAZINE (APRESOLINE) 100 MG tablet TAKE 1 TABLET(100 MG) BY MOUTH THREE TIMES DAILY 90 tablet 3   insulin glargine, 2 Unit Dial, (TOUJEO MAX SOLOSTAR) 300 UNIT/ML Solostar Pen Inject 64 Units into the skin every morning. 24 mL 3   losartan (COZAAR) 25 MG tablet Take 1 tablet (25 mg total) by mouth daily. 90 tablet 3   Continuous Blood Gluc Receiver (FREESTYLE LIBRE 2 READER) DEVI USE ONCE AS DIRECTED (Patient not taking: Reported on 03/17/2022) 1 each 0   Continuous Blood Gluc Sensor (FREESTYLE LIBRE 2 SENSOR) MISC 1 Device by Does not apply route every 14 (fourteen) days. (Patient not taking: Reported on 03/17/2022) 6 each 3   ferrous sulfate 325 (65 FE) MG EC tablet Take 325 mg by mouth 3 (three) times daily with meals. (Patient not taking: Reported on 03/17/2022)     No current facility-administered medications for this visit.    Allergies: Lisinopril and Latex  Past Medical History:  Diagnosis Date   Allergic rhinitis    Asthma    Atypical chest pain    a. 03/2014: normal nuclear stress test.   Chronic diastolic CHF (congestive heart failure) ([place])    a. Dx 03/2014 with echo - moderate LVH, EF 73-42%, grade 1 diastolic dysfunction, mildly dilated LA.   CKD (chronic kidney disease), stage II    Diabetes mellitus type II    GERD (gastroesophageal reflux disease)  Headache(784.0)    when my bp is up   Hypertension    Hypertensive heart disease    Microcytic anemia    OSA (obstructive sleep apnea)    severe with AHI 77/hr with nocturnal hypoxemia   Osteoarthritis     Past Surgical History:  Procedure Laterality Date   SHOULDER ARTHROSCOPY Bilateral    TUBAL LIGATION      Family History  Problem Relation Age of Onset   Heart attack Mother    Hypertension Mother    Cancer Father    Hypertension Brother    Hypertension Maternal Grandmother    Hypertension Maternal Grandfather    Diabetes  Other    Hypertension Other    Stroke Other    Cancer Other        colon; prostate   Kidney disease Neg Hx    Alcohol abuse Neg Hx    Asthma Neg Hx    COPD Neg Hx    Depression Neg Hx    Drug abuse Neg Hx    Early death Neg Hx    Hearing loss Neg Hx    Heart disease Neg Hx    Hyperlipidemia Neg Hx     Social History   Tobacco Use   Smoking status: Former    Types: Cigarettes    Quit date: 08/29/2010    Years since quitting: 11.5   Smokeless tobacco: Never  Substance Use Topics   Alcohol use: No    Alcohol/week: 5.0 standard drinks of alcohol    Types: 5 Glasses of wine per week    Subjective:    Presents with concerns for tingling in her feet; concerned for diabetes control; has not seen her endocrinologist since November 2022;   Overdue to see his kidney specialist who is managing her blood pressure- has not seen them since February 2023;   Overdue to see her cardiologist- has a call in to schedule a follow up;  Stopped her Lexapro over 2-3 months ago; working with her therapist who felt she needed to get on a new medication.   Mammogram ordered in 10/2021- has not been able to get this done;   Notes that lack of transportation is cause of majority of her missed visits/ non compliance;      Objective:  Vitals:   03/17/22 1316 03/17/22 1454  BP: (!) 170/100 (!) 170/100  Pulse: 79   Temp: 98.5 F (36.9 C)   TempSrc: Oral   SpO2: 98%   Weight: 267 lb (121.1 kg)   Height: 5' (1.524 m)     General: Well developed, well nourished, in no acute distress  Skin : Warm and dry.  Head: Normocephalic and atraumatic  Lungs: Respirations unlabored; clear to auscultation bilaterally without wheeze, rales, rhonchi  CVS exam: normal rate and regular rhythm.  Neurologic: Alert and oriented; speech intact; face symmetrical; moves all extremities well; CNII-XII intact without focal deficit   Assessment:  1. Type 2 diabetes mellitus with other specified complication, with  long-term current use of insulin ([place])   2. Depression, recurrent ([place])   3. Lipid screening   4. Numbness and tingling   5. Essential hypertension, malignant   6. Chronic diastolic CHF (congestive heart failure), NYHA class 2 (HCC)   7. Stage 3 chronic kidney disease, unspecified whether stage 3a or 3b CKD ([place])     Plan:  Patient admits that transportation has been a barrier to allow her to make her appointments; will put in referral to social  worker to discuss transportation options;  Will update labs today- not on Rybelsus; keep planned follow up with endocrine; Continue with therapist; start Cymbalta- explained that this could help with depression and numbness/tingling in her extremities; will also refer to psychiatrist to discuss medication options; Check B12 today; Blood pressure is uncontrolled- overdue to see her nephrologist/ cardiologist;    No follow-ups on file.  Orders Placed This Encounter  Procedures   CBC with Differential/Platelet   Comp Met (CMET)   Lipid panel   Hemoglobin A1c   TSH   B12   AMB Referral to [facility] Coordinaton    Referral Priority:   Routine    Referral Type:   Consultation    Referral Reason:   Care Coordination    Number of Visits Requested:   1   Ambulatory referral to Psychiatry    Referral Priority:   Routine    Referral Type:   Psychiatric    Referral Reason:   Specialty Services Required    Requested Specialty:   Psychiatry    Number of Visits Requested:   1    Requested Prescriptions   Signed Prescriptions Disp Refills   DULoxetine (CYMBALTA) 30 MG capsule 90 capsule 0    Sig: Take 1 capsule (30 mg total) by mouth daily.

## 2022-03-17 NOTE — Telephone Encounter (Signed)
   Telephone encounter was:  Unsuccessful.  03/17/2022 Name: Lori Mclaughlin MRN: [id number] DOB: Sep 19, 1960  Unsuccessful outbound call made today to assist with:  Transportation Needs   Outreach Attempt:  1st Attempt  Patient currently busy at this time. She advised she would callback as soon as she was free. CG will try again tomorrow if pt does not return call today.  [facility] Guide, Embedded Care Coordination [facility]  [place], [place] [address]  Main Phone: [phone]  E-mail: [email or web address]  Website: [email or web address]

## 2022-03-18 ENCOUNTER — Telehealth: Payer: Self-pay

## 2022-03-18 ENCOUNTER — Other Ambulatory Visit: Payer: Self-pay | Admitting: Family

## 2022-03-18 ENCOUNTER — Other Ambulatory Visit: Payer: 59

## 2022-03-18 ENCOUNTER — Ambulatory Visit: Payer: 59 | Admitting: Psychology

## 2022-03-18 DIAGNOSIS — E1122 Type 2 diabetes mellitus with diabetic chronic kidney disease: Secondary | ICD-10-CM

## 2022-03-18 DIAGNOSIS — E1169 Type 2 diabetes mellitus with other specified complication: Secondary | ICD-10-CM

## 2022-03-18 LAB — CBC WITH DIFFERENTIAL/PLATELET
Basophils Absolute: 0.1 10*3/uL (ref 0.0–0.1)
Basophils Relative: 0.9 % (ref 0.0–3.0)
Eosinophils Absolute: 0.3 10*3/uL (ref 0.0–0.7)
Eosinophils Relative: 4.2 % (ref 0.0–5.0)
HCT: 35.3 % — ABNORMAL LOW (ref 36.0–46.0)
Hemoglobin: 10.8 g/dL — ABNORMAL LOW (ref 12.0–15.0)
Lymphocytes Relative: 17.7 % (ref 12.0–46.0)
Lymphs Abs: 1.2 10*3/uL (ref 0.7–4.0)
MCHC: 30.6 g/dL (ref 30.0–36.0)
MCV: 81.6 fl (ref 78.0–100.0)
Monocytes Absolute: 0.6 10*3/uL (ref 0.1–1.0)
Monocytes Relative: 8 % (ref 3.0–12.0)
Neutro Abs: 4.9 10*3/uL (ref 1.4–7.7)
Neutrophils Relative %: 69.2 % (ref 43.0–77.0)
Platelets: 183 10*3/uL (ref 150.0–400.0)
RBC: 4.33 Mil/uL (ref 3.87–5.11)
RDW: 16.9 % — ABNORMAL HIGH (ref 11.5–15.5)
WBC: 7 10*3/uL (ref 4.0–10.5)

## 2022-03-18 LAB — LIPID PANEL
Cholesterol: 188 mg/dL (ref 0–200)
HDL: 53.3 mg/dL (ref >39.00)
NonHDL: 134.33
Total CHOL/HDL Ratio: 4
Triglycerides: 206 mg/dL — ABNORMAL HIGH (ref 0.0–149.0)
VLDL: 41.2 mg/dL — ABNORMAL HIGH (ref 0.0–40.0)

## 2022-03-18 LAB — COMPREHENSIVE METABOLIC PANEL
ALT: 10 U/L (ref 0–35)
AST: 12 U/L (ref 0–37)
Albumin: 3.2 g/dL — ABNORMAL LOW (ref 3.5–5.2)
Alkaline Phosphatase: 140 U/L — ABNORMAL HIGH (ref 39–117)
BUN: 32 mg/dL — ABNORMAL HIGH (ref 6–23)
CO2: 23 mEq/L (ref 19–32)
Calcium: 8.5 mg/dL (ref 8.4–10.5)
Chloride: 97 mEq/L (ref 96–112)
Creatinine, Ser: 2.65 mg/dL — ABNORMAL HIGH (ref 0.40–1.20)
GFR: 18.7 mL/min — ABNORMAL LOW (ref >60.00)
Glucose, Bld: 611 mg/dL (ref 70–99)
Potassium: 4.7 mEq/L (ref 3.5–5.1)
Sodium: 132 mEq/L — ABNORMAL LOW (ref 135–145)
Total Bilirubin: 0.3 mg/dL (ref 0.2–1.2)
Total Protein: 6.7 g/dL (ref 6.0–8.3)

## 2022-03-18 LAB — LDL CHOLESTEROL, DIRECT: Direct LDL: 97 mg/dL

## 2022-03-18 LAB — TSH: TSH: 2.55 u[IU]/mL (ref 0.35–5.50)

## 2022-03-18 LAB — VITAMIN B12: Vitamin B-12: 388 pg/mL (ref 211–911)

## 2022-03-18 MED ORDER — TOUJEO MAX SOLOSTAR 300 UNIT/ML ~~LOC~~ SOPN: 40.0000 [IU] | PEN_INJECTOR | Freq: Two times a day (BID) | SUBCUTANEOUS | 3 refills | Status: DC

## 2022-03-18 NOTE — Telephone Encounter (Signed)
Called the patient informed of provider instructions. She did verbalize understanding//agreed to do. Scheduled appt with her PCP for next Tuesday 03/24/22 at 10:20 AM. The patient had no further questions or concerns.

## 2022-03-18 NOTE — Telephone Encounter (Signed)
Spoke to the patient and he is going to come in tomorrow at 630am for labs with chemo and Friday dc pump with ct scan after.

## 2022-03-18 NOTE — Telephone Encounter (Signed)
She has been taking insulin. She was on Rybelsus from a previous doctor (Dr. Everardo All who has retired) but she stopped due to not being able to tolerate it she had stomach upset.

## 2022-03-18 NOTE — Telephone Encounter (Signed)
CRITICAL VALUE STICKER  CRITICAL VALUE: Glucose 611  RECEIVER (on-site recipient of call): Rosita Kea, RMA  DATE & TIME NOTIFIED: 03/18/2022 @ 11:39 am  MESSENGER (representative from lab): Arnoldo Hooker  MD NOTIFIED: Arva Chafe, DO ; Eustace Moore, FNP  TIME OF NOTIFICATION: 11:41 am  RESPONSE:

## 2022-03-18 NOTE — Telephone Encounter (Signed)
Reading note, it appears she was feeling OK yesterday? Has she been taking her insulin?

## 2022-03-18 NOTE — Addendum Note (Signed)
Addended by: Radene Gunning on: 03/18/2022 01:01 PM   Modules accepted: Orders

## 2022-03-18 NOTE — Telephone Encounter (Signed)
Did not tolerate GLP-1's. GFR too low for metformin. At this level of hyperglycemia, SGLT2 inhibitor would likely have stronger diuretic effect worsening her renal function. Will have her take 40 units of Toujeo twice daily. Will send more. F/u w reg PCP next week, monitor sugars at home.

## 2022-03-19 LAB — HEMOGLOBIN A1C: Hgb A1c MFr Bld: >14 % of total Hgb — ABNORMAL HIGH (ref <5.7)

## 2022-03-19 IMAGING — CR DG CHEST 2V
2 series · 2 of 2 positions shown · non-contrast
Comparison: 08/24/2017

CLINICAL DATA: Shortness of breath and wheezing over the last week.

EXAM:
CHEST - 2 VIEW

[w chest pa *]
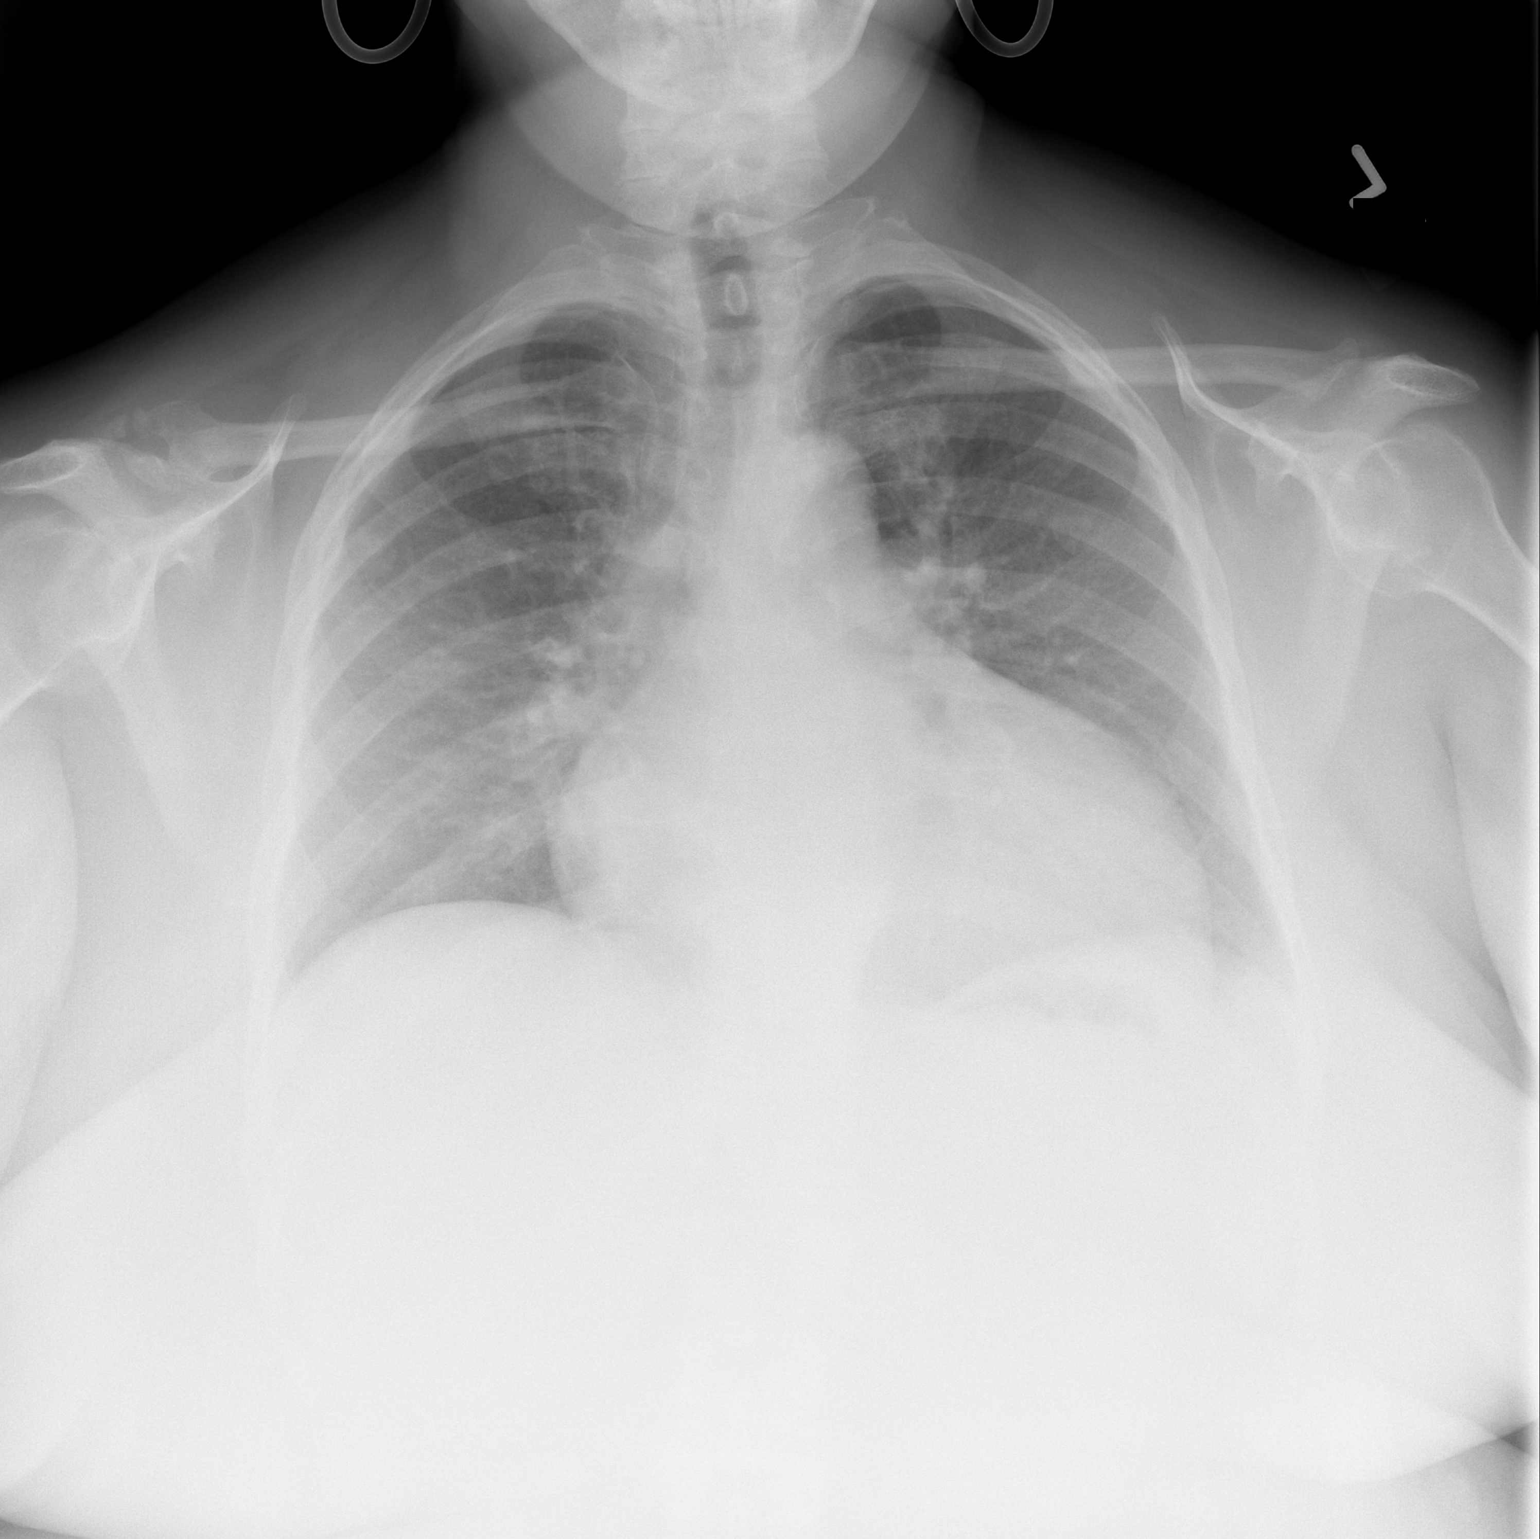

[w chest lat *]
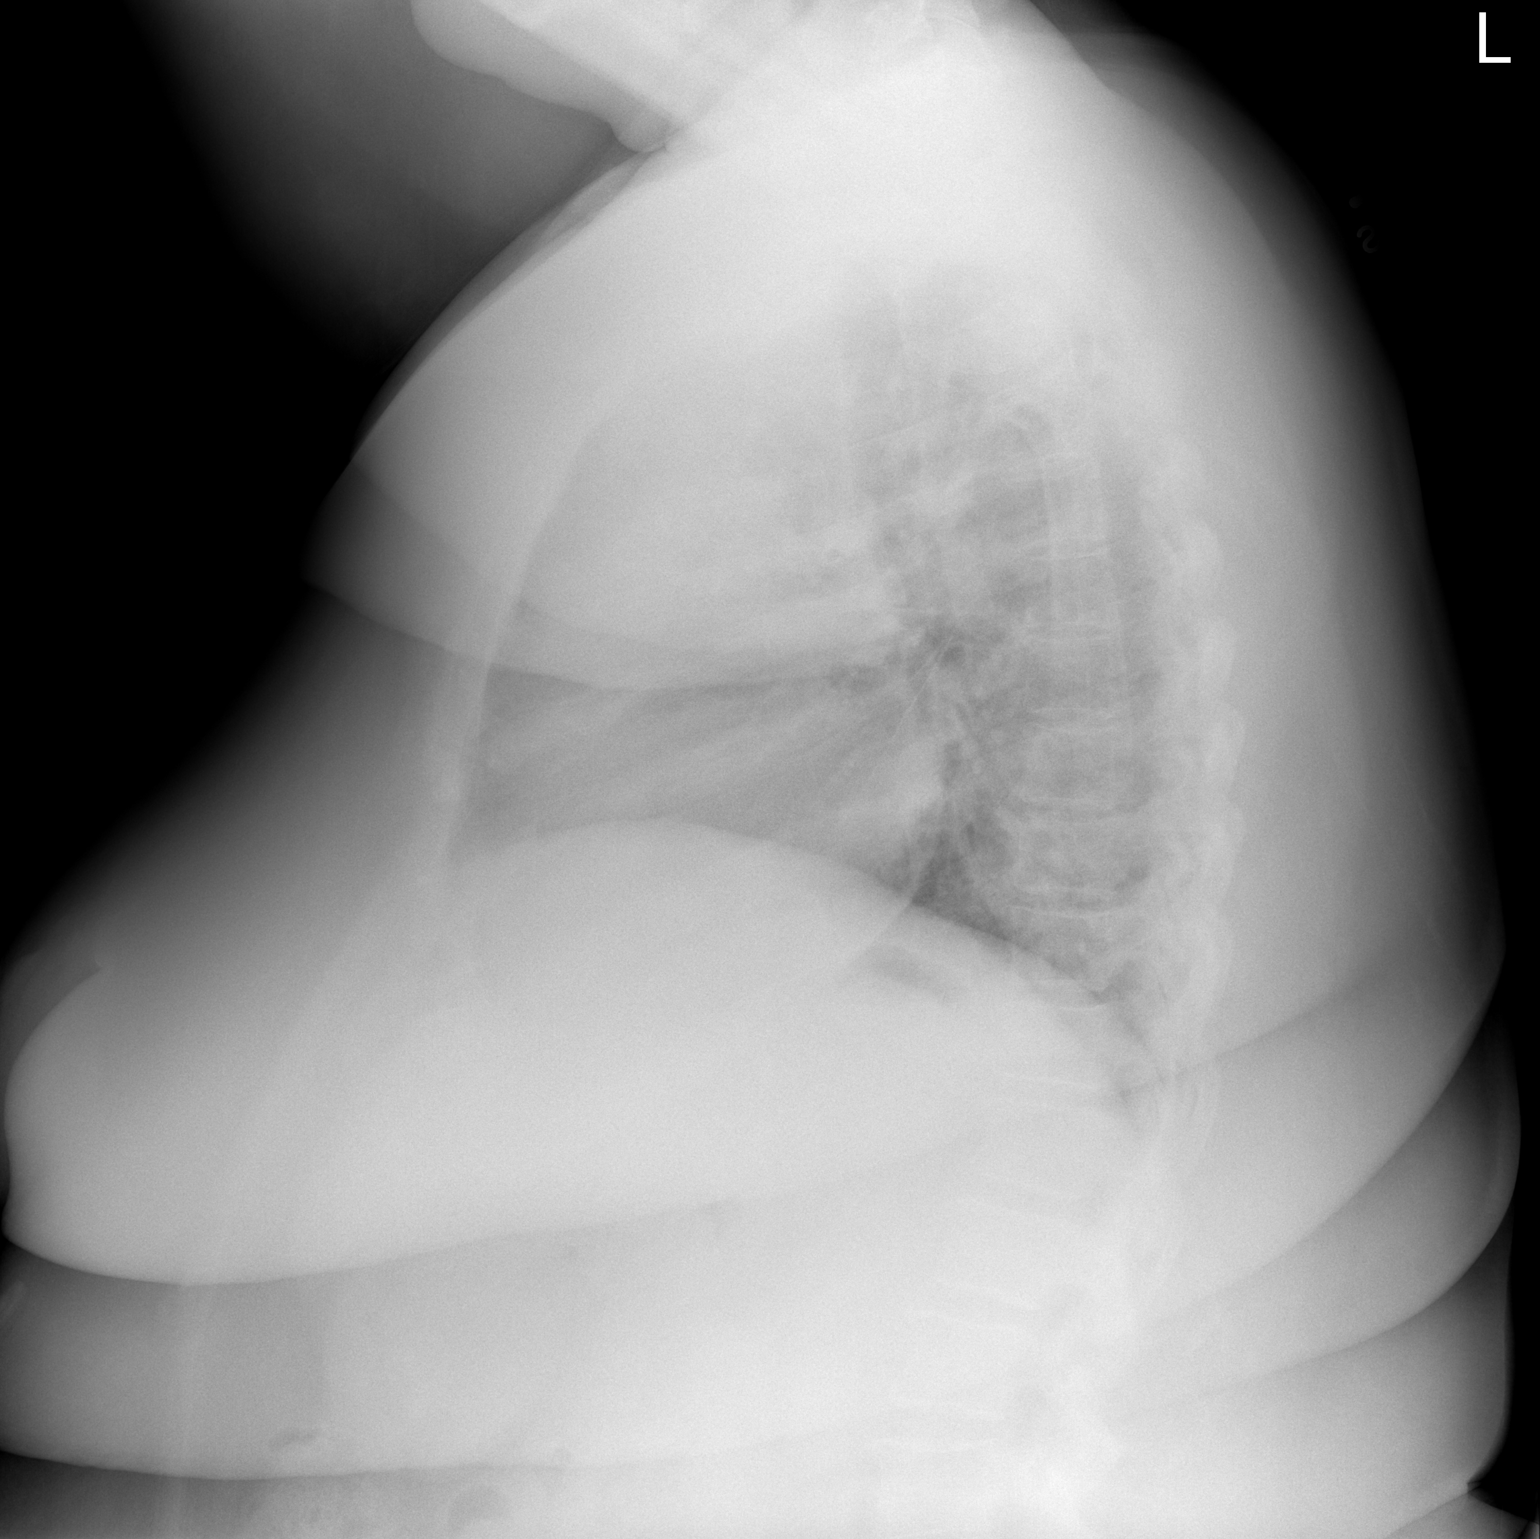

[2 of 2 positions shown; findings below may reference images not displayed]

FINDINGS: The cardiac silhouette is enlarged, consistent with cardiomegaly or
pericardial fluid. There is pulmonary venous hypertension, possibly
with mild interstitial edema. No visible effusion. No consolidation
or collapse. Question 14 mm nodule in the lateral right upper lobe.
No acute bone finding.
IMPRESSION: Newly seen enlargement of the cardiac silhouette with a pattern that
could be due to cardiomegaly and/or pericardial fluid. Pulmonary
venous hypertension, possibly with early interstitial edema.

Question 14 mm lung nodule in the right upper lateral chest.
Follow-up recommended either with additional short-term radiography
or chest CT.

## 2022-03-22 ENCOUNTER — Other Ambulatory Visit: Payer: Self-pay | Admitting: Family

## 2022-03-24 ENCOUNTER — Ambulatory Visit: Payer: 59 | Admitting: Family

## 2022-03-24 ENCOUNTER — Encounter: Payer: Self-pay | Admitting: Family

## 2022-03-24 VITALS — BP 162/90 | HR 67 | Temp 98.2°F | Ht 60.0 in | Wt 265.2 lb

## 2022-03-24 DIAGNOSIS — N1832 Chronic kidney disease, stage 3b: Secondary | ICD-10-CM

## 2022-03-24 DIAGNOSIS — Z794 Long term (current) use of insulin: Secondary | ICD-10-CM

## 2022-03-24 DIAGNOSIS — E1122 Type 2 diabetes mellitus with diabetic chronic kidney disease: Secondary | ICD-10-CM

## 2022-03-24 DIAGNOSIS — N183 Chronic kidney disease, stage 3 unspecified: Secondary | ICD-10-CM

## 2022-03-24 MED ORDER — FREESTYLE LIBRE 3 SENSOR MISC: 2 refills | Status: DC

## 2022-03-24 MED ORDER — BLOOD GLUCOSE METER KIT: PACK | 0 refills | Status: DC

## 2022-03-24 NOTE — Patient Instructions (Signed)
[phone]  Please call to get set up for transportation;

## 2022-03-24 NOTE — Progress Notes (Signed)
Lori Mclaughlin is a 61 y.o. female with the following history as recorded in EpicCare:  Patient Active Problem List   Diagnosis Date Noted   Diabetes ([place]) 05/10/2021   Hypertensive emergency 03/08/2021   Chronic kidney disease (CKD) stage G3b/A1, moderately decreased glomerular filtration rate (GFR) between 30-44 mL/min/1.73 square meter and albuminuria creatinine ratio less than 30 mg/g (HCC) 03/08/2021   Acute on chronic diastolic CHF (congestive heart failure) ([place]) 03/07/2021   Thiamine deficiency 09/19/2017   Myalgia 03/10/2017   Screen for colon cancer 06/12/2015   Visit for screening mammogram 06/12/2015   Hypersomnia 04/11/2014   Morbid obesity ([place]) 04/11/2014   Bradycardia, drug induced 04/11/2014   Chronic diastolic CHF (congestive heart failure), NYHA class 2 ([place]) 03/30/2014   Low back pain radiating to left leg 01/26/2012   Sleep apnea 11/13/2011   Hyperlipidemia with target LDL less than 70 01/14/2011   Deficiency anemia 08/05/2009   Essential hypertension, malignant 08/05/2009   ALLERGIC RHINITIS 08/05/2009   GERD 08/05/2009   OSTEOARTHRITIS 08/05/2009    Current Outpatient Medications  Medication Sig Dispense Refill   allopurinol (ZYLOPRIM) 300 MG tablet TAKE 1 TABLET BY MOUTH DAILY 90 tablet 1   atorvastatin (LIPITOR) 20 MG tablet Take 1 tablet (20 mg total) by mouth at bedtime. 90 tablet 3   blood glucose meter kit and supplies Dispense based on patient and insurance preference. Use up to four times daily as directed. (FOR ICD-10 E10.9, E11.9). 1 each 0   carvedilol (COREG) 6.25 MG tablet Take 1 tablet (6.25 mg total) by mouth 2 (two) times daily with a meal. 180 tablet 3   Continuous Blood Gluc Sensor (FREESTYLE LIBRE 3 SENSOR) MISC Place 1 sensor on the skin every 14 days. Use to check glucose continuously 2 each 2   fluticasone (FLONASE) 50 MCG/ACT nasal spray Place 2 sprays into both nostrils daily. (Patient taking differently: Place 2 sprays into both  nostrils daily as needed.) 16 g 6   furosemide (LASIX) 40 MG tablet Take 1 tablet (40 mg total) by mouth every three (3) days as needed for fluid or edema. (Patient taking differently: Take 40 mg by mouth 3 (three) times a week.) 30 tablet 0   hydrALAZINE (APRESOLINE) 100 MG tablet TAKE 1 TABLET(100 MG) BY MOUTH THREE TIMES DAILY 90 tablet 3   insulin glargine, 2 Unit Dial, (TOUJEO MAX SOLOSTAR) 300 UNIT/ML Solostar Pen Inject 40 Units into the skin 2 (two) times daily. (Patient taking differently: Inject 60 Units into the skin 2 (two) times daily.) 24 mL 3   losartan (COZAAR) 25 MG tablet Take 1 tablet (25 mg total) by mouth daily. 90 tablet 3   No current facility-administered medications for this visit.    Allergies: Lisinopril, Semaglutide(0.25 or 0.18m-dos), and Latex  Past Medical History:  Diagnosis Date   Allergic rhinitis    Asthma    Atypical chest pain    a. 03/2014: normal nuclear stress test.   Chronic diastolic CHF (congestive heart failure) ([place]    a. Dx 03/2014 with echo - moderate LVH, EF 570-35% grade 1 diastolic dysfunction, mildly dilated LA.   CKD (chronic kidney disease), stage II    Diabetes mellitus type II    GERD (gastroesophageal reflux disease)    Headache(784.0)    when my bp is up   Hypertension    Hypertensive heart disease    Microcytic anemia    OSA (obstructive sleep apnea)    severe with AHI 77/hr with nocturnal  hypoxemia   Osteoarthritis     Past Surgical History:  Procedure Laterality Date   SHOULDER ARTHROSCOPY Bilateral    TUBAL LIGATION      Family History  Problem Relation Age of Onset   Heart attack Mother    Hypertension Mother    Cancer Father    Hypertension Brother    Hypertension Maternal Grandmother    Hypertension Maternal Grandfather    Diabetes Other    Hypertension Other    Stroke Other    Cancer Other        colon; prostate   Kidney disease Neg Hx    Alcohol abuse Neg Hx    Asthma Neg Hx    COPD Neg Hx     Depression Neg Hx    Drug abuse Neg Hx    Early death Neg Hx    Hearing loss Neg Hx    Heart disease Neg Hx    Hyperlipidemia Neg Hx     Social History   Tobacco Use   Smoking status: Former    Types: Cigarettes    Quit date: 08/29/2010    Years since quitting: 11.5   Smokeless tobacco: Never  Substance Use Topics   Alcohol use: No    Alcohol/week: 5.0 standard drinks of alcohol    Types: 5 Glasses of wine per week    Subjective:  Follow up elevated glucose- blood sugar was at 611; patient was recommended to start taking 40 units Toujeo bid; patient self- increased to 60 units bid; does not have glucometer at home to check her blood sugar;  Is scheduled to see her nephrologist on September 18; on wait list with endocrine and planning to call cardiology;      Objective:  Vitals:   03/24/22 1040  BP: (!) 164/90  Pulse: 67  Temp: 98.2 F (36.8 C)  TempSrc: Oral  SpO2: 99%  Weight: 265 lb 3.2 oz (120.3 kg)  Height: 5' (1.524 m)    General: Well developed, well nourished, in no acute distress  Skin : Warm and dry.  Head: Normocephalic and atraumatic  Lungs: Respirations unlabored;  Neurologic: Alert and oriented; speech intact; face symmetrical; moves all extremities well; CNII-XII intact without focal deficit   Assessment:  1. Type 2 diabetes mellitus with stage 3 chronic kidney disease, with long-term current use of insulin, unspecified whether stage 3a or 3b CKD ([place])   2. Chronic kidney disease (CKD) stage G3b/A1, moderately decreased glomerular filtration rate (GFR) between 30-44 mL/min/1.73 square meter and albuminuria creatinine ratio less than 30 mg/g (HCC)     Plan:  Update CMP; patient is now taking 60 units of insulin bid; she is scheduled to see her nephrologist in 2 weeks; encouraged to follow up with her cardiologist as well and call to get transportation options set up to improve compliance with appointments; Explained that cancelled Rx for Cymbalta due to  renal function- will not be starting any new medication until she meets with nephrologist;   No follow-ups on file.  Orders Placed This Encounter  Procedures   Basic Metabolic Panel (BMET)    Requested Prescriptions   Signed Prescriptions Disp Refills   blood glucose meter kit and supplies 1 each 0    Sig: Dispense based on patient and insurance preference. Use up to four times daily as directed. (FOR ICD-10 E10.9, E11.9).   Continuous Blood Gluc Sensor (FREESTYLE LIBRE 3 SENSOR) MISC 2 each 2    Sig: Place 1 sensor on the skin  every 14 days. Use to check glucose continuously

## 2022-03-25 ENCOUNTER — Telehealth: Payer: Self-pay

## 2022-03-25 LAB — BASIC METABOLIC PANEL
BUN: 37 mg/dL — ABNORMAL HIGH (ref 6–23)
CO2: 22 mEq/L (ref 19–32)
Calcium: 8.9 mg/dL (ref 8.4–10.5)
Chloride: 100 mEq/L (ref 96–112)
Creatinine, Ser: 3.01 mg/dL — ABNORMAL HIGH (ref 0.40–1.20)
GFR: 16.04 mL/min — ABNORMAL LOW (ref >60.00)
Glucose, Bld: 279 mg/dL — ABNORMAL HIGH (ref 70–99)
Potassium: 4.3 mEq/L (ref 3.5–5.1)
Sodium: 134 mEq/L — ABNORMAL LOW (ref 135–145)

## 2022-03-25 NOTE — Telephone Encounter (Signed)
   Telephone encounter was:  Unsuccessful.  03/25/2022 Name: Lori Mclaughlin MRN: [id number] DOB: 1960/12/06  Unsuccessful outbound call made today to assist with:  Transportation Needs   Outreach Attempt:  2nd Attempt  A HIPAA compliant voice message was left requesting a return call.  Instructed patient to call back at [phone] at their earliest convenience.  [facility] [employer] Guide, Embedded Care Coordination [facility]  [place], [place] [address]  Main Phone: [phone]  E-mail: [email or web address]  Website: [email or web address]

## 2022-03-26 ENCOUNTER — Ambulatory Visit (INDEPENDENT_AMBULATORY_CARE_PROVIDER_SITE_OTHER): Payer: 59 | Admitting: Psychology

## 2022-03-26 ENCOUNTER — Telehealth: Payer: Self-pay

## 2022-03-26 DIAGNOSIS — F4321 Adjustment disorder with depressed mood: Secondary | ICD-10-CM | POA: Diagnosis not present

## 2022-03-26 NOTE — Telephone Encounter (Addendum)
  E-mail encounter was:  Successful.  03/26/2022 Name: Lori Mclaughlin MRN: [id number] DOB: 06-Jul-1961  Lori Mclaughlin is a 61 y.o. year old female who is a primary care patient of Olive Bass, FNP . The community resource team was consulted for assistance with Transportation Needs   Care guide performed the following interventions:  [employer] advised patient does not have a transportation benefit . Patient can contact [facility] for further information if needed, however, [facility] sent community resources for transportation to patients e-mail. [facility] has also sent requested transportation information to patient's e-mail. Vendor information sent: Selena Lesser [employer] [place] - [phone]  -[employer] [employer] and [employer] - [phone] -[employer] [phone]  Follow Up Plan:  No further follow up planned at this time. The patient has been provided with needed resources.  [facility] Guide, Embedded Care Coordination [facility]  [place], [place] [address]  Main Phone: [phone]  E-mail: [email or web address]  Website: [email or web address]

## 2022-03-26 NOTE — Progress Notes (Signed)
[facility] Counselor/Therapist Progress Note  Patient ID: Lori Mclaughlin, MRN: [id number],    Date: 03/26/2022  Time Spent: 45 mins  Treatment Type: Individual Therapy  Reported Symptoms: Pt presented for session via webex video.  Pt granted consent for session, stating she is in her home with no one else present.  I shared with pt that I am in my office with no one else here either.   Mental Status Exam: Appearance:  Casual     Behavior: Appropriate  Motor: Normal  Speech/Language:  Clear and Coherent  Affect: Appropriate  Mood: normal  Thought process: normal  Thought content:   WNL  Sensory/Perceptual disturbances:   WNL  Orientation: oriented to person, place, and time/date  Attention: Good  Concentration: Good  Memory: WNL  Fund of knowledge:  Good  Insight:   Good  Judgment:  Good  Impulse Control: Good   Risk Assessment: Danger to Self:  No Self-injurious Behavior: No Danger to Others: No Duty to Warn:no Physical Aggression / Violence:No  Access to Firearms a concern: No  Gang Involvement:No   Subjective: Pt shares she is feeling better; she was not able to complete our last scheduled session due to an illness.  Pt shares she has been out of work since 03/09/22 due to her inability to work at this time.  Pt advises that she has requested 45 days out of work.  Pt shares that she has been unable to walk like we talked about due to the neuropathy in her feet.  Pt shares her PCP has not given her any mood stabilizing medication yet due to her kidney function issues.  She is to see her kidney specialist on 9/18.  Pt saw her PCP Tuesday of this week and Tuesday of last week as well.  Pt is also scheduled to see her cardiologist on 9/19 as well.  She is also scheduled for more eye injections in Sept as well; she has been having those for over 3 yrs.  Pt shares that her sleep is off; "I have been awake since 12 o'clock last night."  When I met with pt for  the last session on 03/06/22, she reported sleeping more than she should at that time.  Pt shares that her sugar level was 600+ and her BP was a little better at 162/90 this Tuesday in her PCP's office.  They increased her glucose as a result.  Pt shares she has been crying more recently and she is thinking more about death and her fear of it; "not my death, but I am just troubled by it."  Pt shares she has been unable to check her blood sugar at home in the last month because her monitor was recalled; she is to get a new on delivered today.  We talked about what she can do for fun; she may want to read or do word search puzzles; she also enjoys TV shows and movies; pt shares she also enjoys talking with family members and friends by phone or Face Time, etc.  Pt shares she has been trying to eat as healthy as she can recently as well.  Encouraged pt to continue to think about what activities she can engage in for her self care and we will meet in 2 wks for a follow up session.  Interventions: Cognitive Behavioral Therapy  Diagnosis:Adjustment disorder with depressed mood  Plan: Treatment Plan Strengths/Abilities:  Intelligent, Intuitive, Willing to participate in therapy Treatment Preferences:  Outpatient Individual Therapy  Statement of Needs:  Patient is to use CBT, mindfulness and coping skills to help manage and/or decrease symptoms associated with their diagnosis. Symptoms:  Depressed/Irritable mood, worry, social withdrawal Problems Addressed:  Depressive thoughts, Sadness, Sleep issues, etc. Long Term Goals:  Pt to reduce overall level, frequency, and intensity of the feelings of depression as evidenced by decreased irritability, negative self talk, and helpless feelings from 6 to 7 days/week to 0 to 1 days/week, per client report, for at least 3 consecutive months.  Progress: 20% Short Term Goals:  Pt to verbally express understanding of the relationship between feelings of depression and their  impact on thinking patterns and behaviors.  Pt to verbalize an understanding of the role that distorted thinking plays in creating fears, excessive worry, and ruminations.  Progress: 20% Target Date:  07/18/2022 Frequency:  Bi-weekly Modality:  Cognitive Behavioral Therapy Interventions by Therapist:  Therapist will use CBT, Mindfulness exercises, Coping skills and Referrals, as needed by client. Client has verbally approved this treatment plan.  Ivan Anchors, [facility]

## 2022-04-09 ENCOUNTER — Ambulatory Visit (INDEPENDENT_AMBULATORY_CARE_PROVIDER_SITE_OTHER): Payer: 59 | Admitting: Psychology

## 2022-04-09 DIAGNOSIS — F4321 Adjustment disorder with depressed mood: Secondary | ICD-10-CM

## 2022-04-09 NOTE — Progress Notes (Signed)
[facility] Counselor/Therapist Progress Note  Patient ID: OLUWATOMISIN HUSTEAD, MRN: [id number],    Date: 04/09/2022  Time Spent: 45 mins  Treatment Type: Individual Therapy  Reported Symptoms: Pt presented for session via webex video.  Pt granted consent for session, stating she is in her home with no one else present.  I shared with pt that I am in my office with no one else here either.   Mental Status Exam: Appearance:  Casual     Behavior: Appropriate  Motor: Normal  Speech/Language:  Clear and Coherent  Affect: Appropriate  Mood: normal  Thought process: normal  Thought content:   WNL  Sensory/Perceptual disturbances:   WNL  Orientation: oriented to person, place, and time/date  Attention: Good  Concentration: Good  Memory: WNL  Fund of knowledge:  Good  Insight:   Good  Judgment:  Good  Impulse Control: Good   Risk Assessment: Danger to Self:  No Self-injurious Behavior: No Danger to Others: No Duty to Warn:no Physical Aggression / Violence:No  Access to Firearms a concern: No  Gang Involvement:No   Subjective: Pt shares she has been awake since yesterday morning at 730am.  "I just tossed and turned last night and I did not take a nap yesterday at all."  She is to see her kidney specialist on Monday and my cardiologist on Tuesday.  Pt shares that her PCP will not prescribe her any medication at all before she sees her kidney specialist on Monday.  We talked about ways for pt to have a better chance at falling asleep tonight.  Talked pt through the deep breathing process today and pt will practice deep breathing in an effort to use the skill to help her fall asleep.  Pt also asks if we received her disability paperwork which we have not as of today.  She will contact [employer] again after our session to have them send it to our office.  Shared with pt that I will complete the disability paperwork for her as soon as we receive it.  She shared that she is  scheduled to return to work on 04/27/22.  We talked about what she can do for fun; she may want to read or do word search puzzles; she also enjoys TV shows and movies; pt shares she also enjoys talking with family members and friends by phone or Face Time, etc.  Pt shares she has been trying to eat as healthy as she can recently as well.  Encouraged pt to continue to think about what activities she can engage in for her self care and we will meet in 4 wks for a follow up session, due to my vacation.  Interventions: Cognitive Behavioral Therapy  Diagnosis:Adjustment disorder with depressed mood  Plan: Treatment Plan Strengths/Abilities:  Intelligent, Intuitive, Willing to participate in therapy Treatment Preferences:  Outpatient Individual Therapy Statement of Needs:  Patient is to use CBT, mindfulness and coping skills to help manage and/or decrease symptoms associated with their diagnosis. Symptoms:  Depressed/Irritable mood, worry, social withdrawal Problems Addressed:  Depressive thoughts, Sadness, Sleep issues, etc. Long Term Goals:  Pt to reduce overall level, frequency, and intensity of the feelings of depression as evidenced by decreased irritability, negative self talk, and helpless feelings from 6 to 7 days/week to 0 to 1 days/week, per client report, for at least 3 consecutive months.  Progress: 20% Short Term Goals:  Pt to verbally express understanding of the relationship between feelings of depression and their impact on  thinking patterns and behaviors.  Pt to verbalize an understanding of the role that distorted thinking plays in creating fears, excessive worry, and ruminations.  Progress: 20% Target Date:  07/18/2022 Frequency:  Bi-weekly Modality:  Cognitive Behavioral Therapy Interventions by Therapist:  Therapist will use CBT, Mindfulness exercises, Coping skills and Referrals, as needed by client. Client has verbally approved this treatment plan.  Karie Kirks, [facility]

## 2022-04-14 ENCOUNTER — Ambulatory Visit: Payer: 59

## 2022-04-23 ENCOUNTER — Other Ambulatory Visit: Payer: Self-pay | Admitting: Cardiovascular Disease

## 2022-05-04 ENCOUNTER — Other Ambulatory Visit: Payer: Self-pay | Admitting: Cardiovascular Disease

## 2022-05-04 NOTE — Telephone Encounter (Signed)
Rx(s) sent to pharmacy electronically.  

## 2022-05-06 ENCOUNTER — Ambulatory Visit (INDEPENDENT_AMBULATORY_CARE_PROVIDER_SITE_OTHER): Payer: 59 | Admitting: Psychology

## 2022-05-06 DIAGNOSIS — F4321 Adjustment disorder with depressed mood: Secondary | ICD-10-CM

## 2022-05-06 NOTE — Progress Notes (Addendum)
[facility] Counselor/Therapist Progress Note  Patient ID: Lori Mclaughlin, MRN: [id number],    Date: 05/06/2022  Time Spent: 30 mins  Treatment Type: Individual Therapy  Reported Symptoms: Pt presented for session via webex video.  Pt granted consent for session, stating she is in her home with no one else present.  I shared with pt that I am in my office with no one else here either.   Mental Status Exam: Appearance:  Casual     Behavior: Appropriate  Motor: Normal  Speech/Language:  Clear and Coherent  Affect: Appropriate  Mood: normal  Thought process: normal  Thought content:   WNL  Sensory/Perceptual disturbances:   WNL  Orientation: oriented to person, place, and time/date  Attention: Good  Concentration: Good  Memory: WNL  Fund of knowledge:  Good  Insight:   Good  Judgment:  Good  Impulse Control: Good   Risk Assessment: Danger to Self:  No Self-injurious Behavior: No Danger to Others: No Duty to Warn:no Physical Aggression / Violence:No  Access to Firearms a concern: No  Gang Involvement:No   Subjective: Pt shares she has been "doing better since our last session.  I have been sleeping better recently and that has helped.  I went back to work Monday a week ago.  I am working on training and refresher classes right now and those are going well."  Pt shares she works 8 am to 5 pm M-F.  Pt shares that she feels supported by her supervisor.  She will finish her training within the next couple of weeks and then they will ease her back into the job.  Pt shares that her appt with her kidney doctor "was not good.  They are working on getting my BP under control.  All of my doctors work together to help my kidneys and my heart."  Pt shares that her kidney function is at about 19%.  She is hopeful that her kidney function will improve with better control of her BP.  Pt shares she has been sleeping all night and that has been very beneficial for pt.  Pt  shares that she has been helping her grandsons who are in school with their homework and she enjoys that.  Pt shares that she has put in for her retirement as of 07/26/2022.  She can let [employer] two weeks in advance, if she changes her mind.  Pt shares she has been trying to walk a couple of times a week and she takes someone with her, just to be safe.  Pt expresses appreciation for the work we have done together and shares that she feels like she is in a good space in her life again.  Encouraged pt to continue to think about what activities she can engage in for her self care and she will call the office to schedule a follow up session.  Interventions: Cognitive Behavioral Therapy  Diagnosis:Adjustment disorder with depressed mood  Plan: Treatment Plan Strengths/Abilities:  Intelligent, Intuitive, Willing to participate in therapy Treatment Preferences:  Outpatient Individual Therapy Statement of Needs:  Patient is to use CBT, mindfulness and coping skills to help manage and/or decrease symptoms associated with their diagnosis. Symptoms:  Depressed/Irritable mood, worry, social withdrawal Problems Addressed:  Depressive thoughts, Sadness, Sleep issues, etc. Long Term Goals:  Pt to reduce overall level, frequency, and intensity of the feelings of depression as evidenced by decreased irritability, negative self talk, and helpless feelings from 6 to 7 days/week to 0 to 1  days/week, per client report, for at least 3 consecutive months.  Progress: 20% Short Term Goals:  Pt to verbally express understanding of the relationship between feelings of depression and their impact on thinking patterns and behaviors.  Pt to verbalize an understanding of the role that distorted thinking plays in creating fears, excessive worry, and ruminations.  Progress: 20% Target Date:  07/18/2022 Frequency:  Bi-weekly Modality:  Cognitive Behavioral Therapy Interventions by Therapist:  Therapist will use CBT, Mindfulness  exercises, Coping skills and Referrals, as needed by client. Client has verbally approved this treatment plan.  Ivan Anchors, [facility]

## 2022-05-16 ENCOUNTER — Other Ambulatory Visit: Payer: Self-pay | Admitting: Cardiovascular Disease

## 2022-05-18 NOTE — Telephone Encounter (Signed)
Rx(s) sent to pharmacy electronically.  

## 2022-05-21 ENCOUNTER — Ambulatory Visit: Payer: Self-pay | Admitting: Internal Medicine

## 2022-05-25 ENCOUNTER — Other Ambulatory Visit: Payer: Self-pay | Admitting: Family

## 2022-05-27 ENCOUNTER — Other Ambulatory Visit: Payer: Self-pay

## 2022-05-27 ENCOUNTER — Emergency Department (HOSPITAL_BASED_OUTPATIENT_CLINIC_OR_DEPARTMENT_OTHER)
Admission: EM | Admit: 2022-05-27 | Discharge: 2022-05-27 | Disposition: A | Discharge status: Home / Self Care | Payer: 59 | Attending: Emergency Medicine | Admitting: Emergency Medicine

## 2022-05-27 ENCOUNTER — Emergency Department (HOSPITAL_BASED_OUTPATIENT_CLINIC_OR_DEPARTMENT_OTHER): Payer: 59

## 2022-05-27 ENCOUNTER — Encounter (HOSPITAL_BASED_OUTPATIENT_CLINIC_OR_DEPARTMENT_OTHER): Payer: Self-pay | Admitting: Emergency Medicine

## 2022-05-27 DIAGNOSIS — I5032 Chronic diastolic (congestive) heart failure: Secondary | ICD-10-CM | POA: Insufficient documentation

## 2022-05-27 DIAGNOSIS — Z79899 Other long term (current) drug therapy: Secondary | ICD-10-CM | POA: Insufficient documentation

## 2022-05-27 DIAGNOSIS — I130 Hypertensive heart and chronic kidney disease with heart failure and stage 1 through stage 4 chronic kidney disease, or unspecified chronic kidney disease: Secondary | ICD-10-CM | POA: Insufficient documentation

## 2022-05-27 DIAGNOSIS — N182 Chronic kidney disease, stage 2 (mild): Secondary | ICD-10-CM | POA: Diagnosis not present

## 2022-05-27 DIAGNOSIS — R030 Elevated blood-pressure reading, without diagnosis of hypertension: Secondary | ICD-10-CM

## 2022-05-27 DIAGNOSIS — J45909 Unspecified asthma, uncomplicated: Secondary | ICD-10-CM | POA: Insufficient documentation

## 2022-05-27 DIAGNOSIS — K2970 Gastritis, unspecified, without bleeding: Secondary | ICD-10-CM | POA: Insufficient documentation

## 2022-05-27 DIAGNOSIS — Z794 Long term (current) use of insulin: Secondary | ICD-10-CM | POA: Diagnosis not present

## 2022-05-27 DIAGNOSIS — E1122 Type 2 diabetes mellitus with diabetic chronic kidney disease: Secondary | ICD-10-CM | POA: Insufficient documentation

## 2022-05-27 DIAGNOSIS — R1013 Epigastric pain: Secondary | ICD-10-CM | POA: Diagnosis present

## 2022-05-27 LAB — TROPONIN I (HIGH SENSITIVITY)
Troponin I (High Sensitivity): 25 ng/L — ABNORMAL HIGH (ref <18)
Troponin I (High Sensitivity): 24 ng/L — ABNORMAL HIGH (ref <18)

## 2022-05-27 LAB — CBC
HCT: 30.8 % — ABNORMAL LOW (ref 36.0–46.0)
Hemoglobin: 9.6 g/dL — ABNORMAL LOW (ref 12.0–15.0)
MCH: 24.2 pg — ABNORMAL LOW (ref 26.0–34.0)
MCHC: 31.2 g/dL (ref 30.0–36.0)
MCV: 77.8 fL — ABNORMAL LOW (ref 80.0–100.0)
Platelets: 309 10*3/uL (ref 150–400)
RBC: 3.96 MIL/uL (ref 3.87–5.11)
RDW: 15.8 % — ABNORMAL HIGH (ref 11.5–15.5)
WBC: 8.8 10*3/uL (ref 4.0–10.5)
nRBC: 0 % (ref 0.0–0.2)

## 2022-05-27 LAB — URINALYSIS, ROUTINE W REFLEX MICROSCOPIC
Bilirubin Urine: NEGATIVE
Glucose, UA: >=500 mg/dL — AB
Ketones, ur: NEGATIVE mg/dL
Leukocytes,Ua: NEGATIVE
Nitrite: NEGATIVE
Protein, ur: >=300 mg/dL — AB
Specific Gravity, Urine: 1.015 (ref 1.005–1.030)
pH: 5.5 (ref 5.0–8.0)

## 2022-05-27 LAB — COMPREHENSIVE METABOLIC PANEL
ALT: 63 U/L — ABNORMAL HIGH (ref 0–44)
AST: 35 U/L (ref 15–41)
Albumin: 2.4 g/dL — ABNORMAL LOW (ref 3.5–5.0)
Alkaline Phosphatase: 116 U/L (ref 38–126)
Anion gap: 10 (ref 5–15)
BUN: 43 mg/dL — ABNORMAL HIGH (ref 8–23)
CO2: 22 mmol/L (ref 22–32)
Calcium: 8.2 mg/dL — ABNORMAL LOW (ref 8.9–10.3)
Chloride: 101 mmol/L (ref 98–111)
Creatinine, Ser: 2.71 mg/dL — ABNORMAL HIGH (ref 0.44–1.00)
GFR, Estimated: 19 mL/min — ABNORMAL LOW (ref >60)
Glucose, Bld: 317 mg/dL — ABNORMAL HIGH (ref 70–99)
Potassium: 3.7 mmol/L (ref 3.5–5.1)
Sodium: 133 mmol/L — ABNORMAL LOW (ref 135–145)
Total Bilirubin: 0.4 mg/dL (ref 0.3–1.2)
Total Protein: 8.1 g/dL (ref 6.5–8.1)

## 2022-05-27 LAB — URINALYSIS, MICROSCOPIC (REFLEX)

## 2022-05-27 LAB — LIPASE, BLOOD: Lipase: 45 U/L (ref 11–51)

## 2022-05-27 MED ORDER — LIDOCAINE VISCOUS HCL 2 % MT SOLN
15.0000 mL | Freq: Once | OROMUCOSAL | Status: AC
  Administered 2022-05-27: 15 mL via ORAL
  Filled 2022-05-27: qty 15

## 2022-05-27 MED ORDER — PANTOPRAZOLE SODIUM 40 MG PO TBEC: 40.0000 mg | DELAYED_RELEASE_TABLET | Freq: Every day | ORAL | 0 refills | Status: DC

## 2022-05-27 MED ORDER — ONDANSETRON 4 MG PO TBDP: 4.0000 mg | ORAL_TABLET | Freq: Three times a day (TID) | ORAL | 0 refills | Status: DC | PRN

## 2022-05-27 MED ORDER — ONDANSETRON HCL 4 MG/2ML IJ SOLN
4.0000 mg | Freq: Once | INTRAMUSCULAR | Status: AC
  Administered 2022-05-27: 4 mg via INTRAVENOUS
  Filled 2022-05-27: qty 2

## 2022-05-27 MED ORDER — MORPHINE SULFATE (PF) 4 MG/ML IV SOLN
4.0000 mg | Freq: Once | INTRAVENOUS | Status: AC
  Administered 2022-05-27: 4 mg via INTRAVENOUS
  Filled 2022-05-27: qty 1

## 2022-05-27 MED ORDER — ALUM & MAG HYDROXIDE-SIMETH 200-200-20 MG/5ML PO SUSP
30.0000 mL | Freq: Once | ORAL | Status: AC
  Administered 2022-05-27: 30 mL via ORAL
  Filled 2022-05-27: qty 30

## 2022-05-27 MED ORDER — MAALOX 600 MG PO CHEW: 600.0000 mg | CHEWABLE_TABLET | Freq: Three times a day (TID) | ORAL | 0 refills | Status: DC | PRN

## 2022-05-27 MED ORDER — DICYCLOMINE HCL 20 MG PO TABS: 20.0000 mg | ORAL_TABLET | Freq: Two times a day (BID) | ORAL | 0 refills | Status: DC

## 2022-05-27 NOTE — ED Notes (Signed)
Pt transported to radiology.

## 2022-05-27 NOTE — ED Provider Notes (Addendum)
[facility] EMERGENCY DEPARTMENT Provider Note   CSN: [id number] Arrival date & time: 05/27/22  1353     History  Chief Complaint  Patient presents with   Emesis    Lori Mclaughlin is a 61 y.o. female.  61 year old female with past medical history that is significant for diabetes, hypertension, CHF, GERD who presents today for evaluation of epigastric abdominal pain that started 1 week ago associated with emesis.  She states initially it was sharp pain however since then its become a dull pressure sensation that is constant.  She states she is able to take an ibuprofen and fall asleep otherwise she has not noticed any alleviating factors.  She is unsure of any aggravating factors.  She denies any chest pains/pressure, shortness of breath other than following episodes of emesis.  She denies orthopnea outside of her chronic two-pillow orthopnea, PND, peripheral edema that is worse than her baseline.  Patient's blood pressure is elevated today.  She states this is not unusual for her.  She states she is on several medications for her blood pressure however it remains elevated to around 180-200 consistently.  She does follow with a nephrologist.  In addition to being without anginal symptoms she denies balance issues, vision change.  She states her last significant p.o. intake was last Thursday outside of that she has not been able to keep any solid foods down.  She states she was tolerating liquids without much difficulty until yesterday.  She denies fever, chills, flank pain, dysuria, vaginal discharge.  Abdominal pain does not radiate anywhere else.  She does have history of GERD.  She does not take anything regularly.  She states she takes Pepcid over-the-counter occasionally.  She denies any hematemesis, diarrhea, constipation, or melanotic stools.  The history is provided by the patient. No language interpreter was used.       Home Medications Prior to Admission  medications   Medication Sig Start Date End Date Taking? Authorizing Provider  allopurinol (ZYLOPRIM) 300 MG tablet TAKE 1 TABLET BY MOUTH DAILY 02/02/22   Marrian Salvage, FNP  atorvastatin (LIPITOR) 20 MG tablet Take 1 tablet (20 mg total) by mouth at bedtime. NEED APPOINTMENT 05/18/22   Skeet Latch, MD  blood glucose meter kit and supplies Dispense based on patient and insurance preference. Use up to four times daily as directed. (FOR ICD-10 E10.9, E11.9). 03/24/22   Marrian Salvage, FNP  carvedilol (COREG) 6.25 MG tablet TAKE 1 TABLET BY MOUTH TWICE  DAILY WITH A MEAL 04/23/22   Skeet Latch, MD  Continuous Blood Gluc Sensor (FREESTYLE LIBRE 3 SENSOR) MISC PLACE 1 SENSOR ON THE SKIN EVERY 14 DAYS AND USE TO CHECK BLOOD  GLUCOSE CONTINUOUSLY 05/25/22   Marrian Salvage, FNP  fluticasone [facility]) 50 MCG/ACT nasal spray Place 2 sprays into both nostrils daily. Patient taking differently: Place 2 sprays into both nostrils daily as needed. 06/13/21   Marrian Salvage, FNP  furosemide (LASIX) 40 MG tablet Take 1 tablet (40 mg total) by mouth every three (3) days as needed for fluid or edema. Patient taking differently: Take 40 mg by mouth 3 (three) times a week. 03/14/21 05/14/22  Florencia Reasons, MD  hydrALAZINE (APRESOLINE) 100 MG tablet TAKE 1 TABLET BY MOUTH 3 TIMES  DAILY 05/04/22   Turner, Eber Hong, MD  insulin glargine, 2 Unit Dial, (TOUJEO MAX SOLOSTAR) 300 UNIT/ML Solostar Pen Inject 40 Units into the skin 2 (two) times daily. Patient taking differently: Inject 60 Units  into the skin 2 (two) times daily. 03/18/22   Shelda Pal, DO  losartan (COZAAR) 25 MG tablet Take 1 tablet (25 mg total) by mouth daily. 07/15/21   Skeet Latch, MD      Allergies    Lisinopril, Semaglutide(0.25 or 0.84m-dos), and Latex    Review of Systems   Review of Systems  Constitutional:  Negative for chills and fever.  Respiratory:  Negative for cough and shortness of  breath.   Cardiovascular:  Positive for leg swelling (chronic). Negative for chest pain and palpitations.  Gastrointestinal:  Positive for abdominal pain, nausea and vomiting. Negative for constipation and diarrhea.  Genitourinary:  Negative for dysuria, flank pain and vaginal discharge.  Neurological:  Negative for weakness and light-headedness.  All other systems reviewed and are negative.   Physical Exam Updated Vital Signs BP (!) 184/86   Pulse 61   Temp 98 F (36.7 C) (Oral)   Resp 16   Ht 5' (1.524 m)   Wt 117.9 kg   LMP 02/23/2012   SpO2 98%   BMI 50.78 kg/m  Physical Exam Vitals and nursing note reviewed.  Constitutional:      General: She is not in acute distress.    Appearance: Normal appearance. She is not ill-appearing.  HENT:     Head: Normocephalic and atraumatic.     Nose: Nose normal.  Eyes:     General: No scleral icterus.    Extraocular Movements: Extraocular movements intact.     Conjunctiva/sclera: Conjunctivae normal.  Cardiovascular:     Rate and Rhythm: Normal rate and regular rhythm.     Pulses: Normal pulses.  Pulmonary:     Effort: Pulmonary effort is normal. No respiratory distress.     Breath sounds: Normal breath sounds. No wheezing or rales.  Abdominal:     General: There is no distension.     Palpations: Abdomen is soft.     Tenderness: There is abdominal tenderness. There is no right CVA tenderness, left CVA tenderness, guarding or rebound.  Musculoskeletal:        General: Normal range of motion.     Cervical back: Normal range of motion.     Right lower leg: Edema (trace pitting edema) present.     Left lower leg: Edema (trace pitting edema) present.  Skin:    General: Skin is warm and dry.  Neurological:     General: No focal deficit present.     Mental Status: She is alert. Mental status is at baseline.     ED Results / Procedures / Treatments   Labs (all labs ordered are listed, but only abnormal results are  displayed) Labs Reviewed  CBC - Abnormal; Notable for the following components:      Result Value   Hemoglobin 9.6 (*)    HCT 30.8 (*)    MCV 77.8 (*)    MCH 24.2 (*)    RDW 15.8 (*)    All other components within normal limits  URINALYSIS, ROUTINE W REFLEX MICROSCOPIC - Abnormal; Notable for the following components:   APPearance CLOUDY (*)    Glucose, UA >=500 (*)    Hgb urine dipstick TRACE (*)    Protein, ur >=300 (*)    All other components within normal limits  URINALYSIS, MICROSCOPIC (REFLEX) - Abnormal; Notable for the following components:   Bacteria, UA MANY (*)    All other components within normal limits  LIPASE, BLOOD  COMPREHENSIVE METABOLIC PANEL  TROPONIN I (HIGH  SENSITIVITY)    EKG None  Radiology No results found.  Procedures Procedures    Medications Ordered in ED Medications  morphine (PF) 4 MG/ML injection 4 mg (has no administration in time range)  ondansetron (ZOFRAN) injection 4 mg (has no administration in time range)  alum & mag hydroxide-simeth (MAALOX/MYLANTA) 200-200-20 MG/5ML suspension 30 mL (has no administration in time range)    And  lidocaine (XYLOCAINE) 2 % viscous mouth solution 15 mL (has no administration in time range)    ED Course/ Medical Decision Making/ A&P                           Medical Decision Making Amount and/or Complexity of Data Reviewed Labs: ordered. Radiology: ordered.  Risk OTC drugs. Prescription drug management.   Medical Decision Making / ED Course   This patient presents to the ED for concern of epigastric abdominal pain, this involves an extensive number of treatment options, and is a complaint that carries with it a high risk of complications and morbidity.  The differential diagnosis includes pancreatitis, cholecystitis, cholelithiasis, gastroenteritis, atypical ACS  MDM: 61 year old female with past medical history as noted above presents today for evaluation of epigastric abdominal pain  associated with emesis for about 1 week.  Symptoms started last Wednesday.  States decreased p.o. intake since Thursday particularly worse since yesterday.  She states she has been taking her medications but doubt that they have been stay in her system long enough before she throws them up.  She is overall well-appearing.  Diffuse abdominal tenderness is present particularly worse in the epigastric region.  She endorses back pain particularly in the region mirroring her epigastrium.  She states however that this back pain is typical for her chronic MSK back pain.  Low suspicion for ACS given no chest pain, shortness of breath.  However given her history of CHF, and describing this as a pressure-like epigastric abdominal pain with elevated blood pressure will obtain ACS work-up.  Low suspicion for PE as she denies shortness of breath, she is without tachycardia, without tachypnea, or without hypoxia.  No prior history of DVT or PE.  No recent travel.  Will provide GI cocktail, morphine, Zofran for pain control.  Will defer on IV fluids given her history of CHF.  Will obtain chest x-ray.  CBC is without leukocytosis, hemoglobin of 9.6 which is around her baseline.  UA without evidence of UTI.  CMP reveals creatinine of 2.71, mildly elevated BUN of 43, elevated glucose at 317, ALT of 63 which is mildly above upper end of normal, normal AST and other LFTs.  Initial troponin of 25.  Lipase within normal limits. On reevaluation patient reports significant improvement in her pain.  She is feeling much better.  Abdomen remains benign.  Given the mildly elevated troponin which appears to be around her baseline given the prior cardiac markers.  Low suspicion for ACS.  Particularly in the setting of CKD which can cause mild elevations in troponin.  Will repeat to ensure there is no significant increase.  She remains without chest pain.  She has tolerated p.o. intake without difficulty.  Chest x-ray is without acute  cardiopulmonary process. Repeat troponin is 24.  This is flat.  Not consistent with ACS.  We did discuss further evaluation of abdominal pain with CT abdomen pelvis however patient prefers conservative management with Protonix for gastritis along with Maalox.  Will provide Bentyl and Zofran to keep on  hand as needed.  Discussed follow-up with PCP.  Return precautions discussed.  Patient voices understanding and is in agreement with plan.  Extensive discussion had regarding her elevated blood pressure and needing to closely follow-up with her outpatient providers to get better control of this.  Given the elevated troponin in setting of elevated blood pressure hypertensive emergency considered however she is without chest pain, shortness of breath.  She has had similar readings in the past.  And given her comorbidities this is likely baseline as opposed to acute elevation in setting of her elevated blood pressure as this is chronically elevated as opposed to acutely.   Lab Tests: -I ordered, reviewed, and interpreted labs.   The pertinent results include:   Labs Reviewed  CBC - Abnormal; Notable for the following components:      Result Value   Hemoglobin 9.6 (*)    HCT 30.8 (*)    MCV 77.8 (*)    MCH 24.2 (*)    RDW 15.8 (*)    All other components within normal limits  URINALYSIS, ROUTINE W REFLEX MICROSCOPIC - Abnormal; Notable for the following components:   APPearance CLOUDY (*)    Glucose, UA >=500 (*)    Hgb urine dipstick TRACE (*)    Protein, ur >=300 (*)    All other components within normal limits  URINALYSIS, MICROSCOPIC (REFLEX) - Abnormal; Notable for the following components:   Bacteria, UA MANY (*)    All other components within normal limits  LIPASE, BLOOD  COMPREHENSIVE METABOLIC PANEL  TROPONIN I (HIGH SENSITIVITY)      EKG  EKG Interpretation  Date/Time:    Ventricular Rate:    PR Interval:    QRS Duration:   QT Interval:    QTC Calculation:   R Axis:      Text Interpretation:           Imaging Studies ordered: I ordered imaging studies including chest x-ray I independently visualized and interpreted imaging. I agree with the radiologist interpretation   Medicines ordered and prescription drug management: Meds ordered this encounter  Medications   morphine (PF) 4 MG/ML injection 4 mg   ondansetron (ZOFRAN) injection 4 mg   AND Linked Order Group    alum & mag hydroxide-simeth (MAALOX/MYLANTA) 200-200-20 MG/5ML suspension 30 mL    lidocaine (XYLOCAINE) 2 % viscous mouth solution 15 mL    -I have reviewed the patients home medicines and have made adjustments as needed  Reevaluation: After the interventions noted above, I reevaluated the patient and found that they have :improved  Co morbidities that complicate the patient evaluation  Past Medical History:  Diagnosis Date   Allergic rhinitis    Asthma    Atypical chest pain    a. 03/2014: normal nuclear stress test.   Chronic diastolic CHF (congestive heart failure) ([place])    a. Dx 03/2014 with echo - moderate LVH, EF 35-32%, grade 1 diastolic dysfunction, mildly dilated LA.   CKD (chronic kidney disease), stage II    Diabetes mellitus type II    GERD (gastroesophageal reflux disease)    Headache(784.0)    when my bp is up   Hypertension    Hypertensive heart disease    Microcytic anemia    OSA (obstructive sleep apnea)    severe with AHI 77/hr with nocturnal hypoxemia   Osteoarthritis       Dispostion: Patient is appropriate for discharge.  Discharged in stable condition.  Return precautions discussed.   Final  Clinical Impression(s) / ED Diagnoses Final diagnoses:  Gastritis without bleeding, unspecified chronicity, unspecified gastritis type  Epigastric abdominal pain  Elevated blood pressure reading    Rx / DC Orders ED Discharge Orders          Ordered    pantoprazole (PROTONIX) 40 MG tablet  Daily        05/27/22 1800    Calcium Carbonate Antacid  (MAALOX) 600 MG chewable tablet  Every 8 hours PRN        05/27/22 1800    dicyclomine (BENTYL) 20 MG tablet  2 times daily        05/27/22 1800    ondansetron (ZOFRAN-ODT) 4 MG disintegrating tablet  Every 8 hours PRN        05/27/22 1800              Evlyn Courier, PA-C 05/27/22 1800    Evlyn Courier, PA-C 05/27/22 1801    Charlesetta Shanks, MD 06/18/22 1734

## 2022-05-27 NOTE — Discharge Instructions (Signed)
Your work-up today was overall reassuring.  No concerning cause of your abdominal pain.  After the medicines you received your pain did improve.  We discussed obtaining a CT scan versus starting you on some medications and following up with your primary care provider.  You opted to try the medications and follow-up.  You will return for any worsening symptoms.  Bentyl and Zofran were sent in to use as needed for your symptoms.  Otherwise take Protonix and Maalox for the gastritis.

## 2022-05-27 NOTE — ED Notes (Signed)
ED Provider at bedside. 

## 2022-05-27 NOTE — ED Triage Notes (Signed)
Emesis x 1 week , epigastric pain . Hx GERD. No relief despite meds since constant emesis Denies chest pain or shortness of breath . Hx CHF

## 2022-06-02 ENCOUNTER — Ambulatory Visit (HOSPITAL_BASED_OUTPATIENT_CLINIC_OR_DEPARTMENT_OTHER): Payer: 59 | Admitting: Cardiovascular Disease

## 2022-06-04 ENCOUNTER — Encounter (HOSPITAL_BASED_OUTPATIENT_CLINIC_OR_DEPARTMENT_OTHER): Payer: Self-pay | Admitting: Cardiovascular Disease

## 2022-06-04 ENCOUNTER — Telehealth (HOSPITAL_BASED_OUTPATIENT_CLINIC_OR_DEPARTMENT_OTHER): Payer: Self-pay | Admitting: Cardiovascular Disease

## 2022-06-04 ENCOUNTER — Ambulatory Visit (INDEPENDENT_AMBULATORY_CARE_PROVIDER_SITE_OTHER): Payer: 59 | Admitting: Cardiovascular Disease

## 2022-06-04 VITALS — BP 188/109 | HR 71 | Ht 60.0 in | Wt 265.5 lb

## 2022-06-04 DIAGNOSIS — I5032 Chronic diastolic (congestive) heart failure: Secondary | ICD-10-CM | POA: Diagnosis not present

## 2022-06-04 DIAGNOSIS — I10 Essential (primary) hypertension: Secondary | ICD-10-CM | POA: Diagnosis not present

## 2022-06-04 DIAGNOSIS — R1011 Right upper quadrant pain: Secondary | ICD-10-CM | POA: Insufficient documentation

## 2022-06-04 DIAGNOSIS — R110 Nausea: Secondary | ICD-10-CM

## 2022-06-04 HISTORY — DX: Right upper quadrant pain: R10.11

## 2022-06-04 MED ORDER — AMLODIPINE BESYLATE 10 MG PO TABS: 10.0000 mg | ORAL_TABLET | Freq: Every day | ORAL | 3 refills | Status: DC

## 2022-06-04 MED ORDER — LOSARTAN POTASSIUM 50 MG PO TABS: 50.0000 mg | ORAL_TABLET | Freq: Every day | ORAL | 3 refills | Status: DC

## 2022-06-04 NOTE — Assessment & Plan Note (Signed)
She had 2 weeks of right upper quadrant pain, nausea, and vomiting.  Her pain preceded the nausea and vomiting.  LFTs mildly abnormal.  Bilirubin was normal.  We will check a right upper quadrant ultrasound to assess for gallstones.  Check a CMP in a week.

## 2022-06-04 NOTE — Assessment & Plan Note (Addendum)
She is euvolemic.  Blood pressures are very poorly controlled as above.  Making adjustments as above.  Continue Farxiga and lasix.

## 2022-06-04 NOTE — Patient Instructions (Addendum)
Medication Instructions:  INCREASE YOUR LOSARTAN TO 50  MG DAILY   INCREASE YOUR AMLODIPINE TO 10 MG DAILY   Labwork: CMET IN 1 WEEK   Testing/Procedures: RIGHT UPPER QUAD ULTRASOUND   Follow-Up: 07/08/2022 11:30 AM WITH PHARM D   If you need a refill on your cardiac medications before your next appointment, please call your pharmacy.

## 2022-06-04 NOTE — Assessment & Plan Note (Signed)
Blood pressure remains uncontrolled.  We will increase amlodipine to 10mg  and losartan to 50mg .  Check CMP in a week. Continue carvedilol.  Will not increase given heart rates in the 60s.  Continue hydralazine.  Keep tracking BP at home and bring to follow up.

## 2022-06-04 NOTE — Telephone Encounter (Signed)
Spoke with patient regarding the Friday 06/12/22 11:00 am RUQ U/S scheduled here at [facility].  Arrival time is 10:45 am---nothing to eat or drink after midnight----patient voiced her understanding.

## 2022-06-04 NOTE — Progress Notes (Signed)
Advanced Hypertension Clinic Initial Assessment:    Date:  06/04/2022   ID:  Lori Mclaughlin, DOB [birth year], MRN [id number]  PCP:  Marrian Salvage, FNP  Cardiologist:  Fransico Him, MD  Nephrologist:  Referring MD: Marrian Salvage,*   CC: Hypertension  History of Present Illness:    Lori Mclaughlin is a 61 y.o. female with a hx of asthma, chronic diastolic CHF, CKD stage 2, diabetes mellitus type 2 (uncontrolled), GERD, hypertension, microcytic anemia, morbid obesity, OSA with nocturnal hypoxemia, and osteoarthritis, here for follow up in the Advanced Hypertension Clinic. She first established care in the Advanced Hypertension clinic 03/15/21  She saw Dr. Radford Pax 01/02/2021 for follow-up. At that visit her blood pressure was 222/99.  On 01/21/2021 she was referred to advanced hypertension clinic by Dr. Radford Pax after [name] reported diarrhea and cramping side effects from 75 mg hydralazine. She was admitted to the hospital 09/2020 for acute on chronic diastolic heart failure and hypertensive emergency. Her blood pressure was 208/94 on admission. She was diuresed with IV Lasix. Her hospitalization was complicated by acute on chronic renal failure. Her ARB was held at discharge. She had recently been prescriped doxazosin but hadn't started it yet. She has known sleep apnea and is in the process of getting a CPAP. She had bradycardia in the 30's while sleeping in the hospital, therefore carvedilol was decreased. She had an Echo on admission with LVEF 60-65% and grade 2 diastolic dysfunction. She wore an ambulatory blood pressure monitor 12/2020 that showed her blood pressure was averaging 180s/80s. 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  Doxazosin was discontinued and she was started on Clonidine. She was referred to the prep program. She followed up with our pharmacist 08/15/21. Her blood pressure remained  uncontrolled so Clonidine was increased. She has also been working with her nephrologist for hypertension.  Today the patient states that she has been feeling poorly. She has been throwing up for the past two weeks and complains of chest pain which started before. She has only been able to eat food since Tuesday. She had gone to the ER due to the chest pain who said it was Gastritis. She was also unable to retain her medicine and has seen her blood pressure worsen as a result. Her blood pressure at home before the vomiting episode was around 180/90, it is 188/109 in clinic. She denies any palpitations, shortness of breath, or peripheral edema. No lightheadedness, syncope, orthopnea, or PND.  Previous antihypertensives: Lisinopril Clonidine Doxazosin- wobbly   Past Medical History:  Diagnosis Date   Allergic rhinitis    Asthma    Atypical chest pain    a. 03/2014: normal nuclear stress test.   Chronic diastolic CHF (congestive heart failure) ([place])    a. Dx 03/2014 with echo - moderate LVH, EF 47-84%, grade 1 diastolic dysfunction, mildly dilated LA.   Chronic diastolic heart failure ([place]) 03/07/2021   CKD (chronic kidney disease), stage II    Diabetes mellitus type II    GERD (gastroesophageal reflux disease)    Headache(784.0)    when my bp is up   Hypertension    Hypertensive heart disease    Microcytic anemia    OSA (obstructive sleep apnea)    severe with AHI 77/hr with nocturnal hypoxemia   Osteoarthritis    Right upper quadrant pain 06/04/2022    Past Surgical History:  Procedure Laterality Date   SHOULDER ARTHROSCOPY  Bilateral    TUBAL LIGATION      Current Medications: Current Meds  Medication Sig   allopurinol (ZYLOPRIM) 300 MG tablet TAKE 1 TABLET BY MOUTH DAILY   atorvastatin (LIPITOR) 20 MG tablet Take 1 tablet (20 mg total) by mouth at bedtime. NEED APPOINTMENT   blood glucose meter kit and supplies Dispense based on patient and insurance preference. Use up to  four times daily as directed. (FOR ICD-10 E10.9, E11.9).   Calcium Carbonate Antacid (MAALOX) 600 MG chewable tablet Chew 1 tablet (600 mg total) by mouth every 8 (eight) hours as needed for heartburn.   carvedilol (COREG) 6.25 MG tablet TAKE 1 TABLET BY MOUTH TWICE  DAILY WITH A MEAL   Continuous Blood Gluc Sensor (FREESTYLE LIBRE 3 SENSOR) MISC PLACE 1 SENSOR ON THE SKIN EVERY 14 DAYS AND USE TO CHECK BLOOD  GLUCOSE CONTINUOUSLY   dicyclomine (BENTYL) 20 MG tablet Take 1 tablet (20 mg total) by mouth 2 (two) times daily.   FARXIGA 10 MG TABS tablet Take 10 mg by mouth daily.   fluticasone (FLONASE) 50 MCG/ACT nasal spray Place 2 sprays into both nostrils daily. (Patient taking differently: Place 2 sprays into both nostrils daily as needed.)   furosemide (LASIX) 40 MG tablet Take 40 mg by mouth daily.   hydrALAZINE (APRESOLINE) 100 MG tablet TAKE 1 TABLET BY MOUTH 3 TIMES  DAILY   insulin glargine, 2 Unit Dial, (TOUJEO MAX SOLOSTAR) 300 UNIT/ML Solostar Pen Inject 40 Units into the skin 2 (two) times daily. (Patient taking differently: Inject 60 Units into the skin 2 (two) times daily.)   ondansetron (ZOFRAN-ODT) 4 MG disintegrating tablet Take 1 tablet (4 mg total) by mouth every 8 (eight) hours as needed.   pantoprazole (PROTONIX) 40 MG tablet Take 1 tablet (40 mg total) by mouth daily.   Vitamin D, Ergocalciferol, (DRISDOL) 1.25 MG (50000 UNIT) CAPS capsule Take 50,000 Units by mouth once a week.   [DISCONTINUED] amLODipine (NORVASC) 5 MG tablet Take 5 mg by mouth daily.   [DISCONTINUED] losartan (COZAAR) 25 MG tablet Take 1 tablet (25 mg total) by mouth daily.     Allergies:   Lisinopril, Semaglutide(0.25 or 0.57m-dos), and Latex   Social History   Socioeconomic History   Marital status: Divorced    Spouse name: Not on file   Number of children: Not on file   Years of education: Not on file   Highest education level: Not on file  Occupational History   Not on file  Tobacco Use    Smoking status: Former    Types: Cigarettes    Quit date: 08/29/2010    Years since quitting: 11.7   Smokeless tobacco: Never  Vaping Use   Vaping Use: Never used  Substance and Sexual Activity   Alcohol use: No    Alcohol/week: 5.0 standard drinks of alcohol    Types: 5 Glasses of wine per week   Drug use: No   Sexual activity: Yes    Birth control/protection: Surgical  Other Topics Concern   Not on file  Social History Narrative   Regular Exercise- yes   Social Determinants of Health   Financial Resource Strain: Not on file  Food Insecurity: Not on file  Transportation Needs: Not on file  Physical Activity: Not on file  Stress: Not on file  Social Connections: Not on file     Family History: The patient's family history includes Cancer in her father and another family member; Diabetes in an other  family member; Heart attack in her mother; Hypertension in her brother, maternal grandfather, maternal grandmother, mother, and another family member; Stroke in an other family member. There is no history of Kidney disease, Alcohol abuse, Asthma, COPD, Depression, Drug abuse, Early death, Hearing loss, Heart disease, or Hyperlipidemia.  ROS:   Please see the history of present illness.    (+)Emesis (+)Chest pain (GI related) All other systems reviewed and are negative.  EKGs/Labs/Other Studies Reviewed:    Echo 03/08/2021: 1. Left ventricular ejection fraction, by estimation, is 60 to 65%. The  left ventricle has normal function. The left ventricle has no regional  wall motion abnormalities. There is mild concentric left ventricular  hypertrophy. Left ventricular diastolic  parameters are consistent with Grade II diastolic dysfunction  (pseudonormalization).   2. Right ventricular systolic function is normal. The right ventricular  size is normal. Tricuspid regurgitation signal is inadequate for assessing  PA pressure.   3. The mitral valve is grossly normal. Trivial mitral  valve  regurgitation. No evidence of mitral stenosis.   4. The aortic valve is tricuspid. Aortic valve regurgitation is not  visualized. No aortic stenosis is present.   5. The inferior vena cava is dilated in size with >50% respiratory  variability, suggesting right atrial pressure of 8 mmHg.   CAR 24HR Blood Pressure Monitor 01/06/2021: Overall BP average 187/74mHg Awake BP average 188/821mg Asleep BP average 183/8149m 100% of SBP>140m66mwhile awake and > 120mm43mhile asleep 51% of DBP >90mmH41mile awake and > 80mmHg52mle asleep.  EKG:  The EKG is personally reviewed 06/04/2022: NSR, HR 71, RBBB 03/18/2021: The EKG was not ordered.  Recent Labs: 03/17/2022: TSH 2.55 05/27/2022: ALT 63; BUN 43; Creatinine, Ser 2.71; Hemoglobin 9.6; Platelets 309; Potassium 3.7; Sodium 133   Recent Lipid Panel    Component Value Date/Time   CHOL 188 03/17/2022 1349   TRIG 206.0 (H) 03/17/2022 1349   HDL 53.30 03/17/2022 1349   CHOLHDL 4 03/17/2022 1349   VLDL 41.2 (H) 03/17/2022 1349   LDLCALC 75 03/08/2021 0326   LDLDIRECT 97.0 03/17/2022 1349    Physical Exam:    VS:  BP (!) 188/109 (BP Location: Left Arm, Patient Position: Sitting, Cuff Size: Large)   Pulse 71   Ht 5' (1.524 m)   Wt 265 lb 8 oz (120.4 kg)   LMP 02/23/2012   BMI 51.85 kg/m  , BMI Body mass index is 51.85 kg/m. GENERAL:  Well appearing.  Presents in a wheelchair HEENT: Pupils equal round and reactive, fundi not visualized, oral mucosa unremarkable NECK:  No jugular venous distention, waveform within normal limits, carotid upstroke brisk and symmetric, no bruits LUNGS:  Clear to auscultation bilaterally HEART:  RRR.  PMI not displaced or sustained,S1 and S2 within normal limits, no S3, no S4, no clicks, no rubs, no murmurs ABD:  Positive bowel sounds normal in frequency in pitch, no bruits, no rebound, no guarding, no midline pulsatile mass, no hepatomegaly, no splenomegaly, RUQ soreness on palpitation EXT:  2 plus  pulses throughout, no edema, no cyanosis no clubbing SKIN:  No rashes no nodules NEURO:  Cranial nerves II through XII grossly intact, motor grossly intact throughout PSYCH:  Cognitively intact, oriented to person place and time  ASSESSMENT/PLAN:    Essential hypertension, malignant Blood pressure remains uncontrolled.  We will increase amlodipine to 10mg an30msartan to 50mg.  C65m CMP in a week. Continue carvedilol.  Will not increase given heart rates in the 60s.  Continue  hydralazine.  Keep tracking BP at home and bring to follow up.   Chronic diastolic heart failure ([place]) She is euvolemic.  Blood pressures are very poorly controlled as above.  Making adjustments as above.  Continue Farxiga and lasix.   Right upper quadrant pain She had 2 weeks of right upper quadrant pain, nausea, and vomiting.  Her pain preceded the nausea and vomiting.  LFTs mildly abnormal.  Bilirubin was normal.  We will check a right upper quadrant ultrasound to assess for gallstones.  Check a CMP in a week.   Screening for Secondary Hypertension:     04/01/2021   12:51 PM  Causes  Drugs/Herbals Screened     - Comments some caffeine  Renovascular HTN Screened     - Comments renal Dopplers negative 02/2020  Sleep Apnea Screened     - Comments awaiting CPAP  Thyroid Disease Screened  Hyperaldosteronism Screened  Pheochromocytoma N/A  Cushing's Syndrome N/A  Hyperparathyroidism N/A  Coarctation of the Aorta Screened     - Comments BP symmetric  Compliance Screened    Relevant Labs/Studies:    Latest Ref Rng & Units 05/27/2022    2:58 PM 03/24/2022   11:07 AM 03/17/2022    1:49 PM  Basic Labs  Sodium 135 - 145 mmol/L 133  134  132   Potassium 3.5 - 5.1 mmol/L 3.7  4.3  4.7   Creatinine 0.44 - 1.00 mg/dL 2.71  3.01  2.65        Latest Ref Rng & Units 03/17/2022    1:49 PM 03/08/2021    3:26 AM  Thyroid   TSH 0.35 - 5.50 uIU/mL 2.55  2.514        Latest Ref Rng & Units 12/11/2019    2:55 PM   Renin/Aldosterone   Aldosterone 0.0 - 30.0 ng/dL 5.5   Renin 0.167 - 5.380 ng/mL/hr 0.426   Aldos/Renin Ratio 0.0 - 30.0 12.9              02/29/2020    8:58 AM  Renovascular   Renal Artery [place] Completed Yes     Disposition:    FU with PharmD in 1 month.  FU with Tiffany C. Oval Linsey, MD, [facility] in 4 months.   Medication Adjustments/Labs and Tests Ordered: Current medicines are reviewed at length with the patient today.  Concerns regarding medicines are outlined above.   Orders Placed This Encounter  Procedures   US ABDOMEN LIMITED RUQ (LIVER/GB)   Comprehensive metabolic panel   EKG [id number]   Meds ordered this encounter  Medications   losartan (COZAAR) 50 MG tablet    Sig: Take 1 tablet (50 mg total) by mouth daily.    Dispense:  90 tablet    Refill:  3    D/C 25 MG RX   amLODipine (NORVASC) 10 MG tablet    Sig: Take 1 tablet (10 mg total) by mouth daily.    Dispense:  90 tablet    Refill:  3    D/C 5 MG RX   I,Coren O'Brien,acting as a scribe for Skeet Latch, MD.,have documented all relevant documentation on the behalf of Skeet Latch, MD,as directed by  Skeet Latch, MD while in the presence of Skeet Latch, MD.  I, [place] Oval Linsey, MD have reviewed all documentation for this visit.  The documentation of the exam, diagnosis, procedures, and orders on 06/04/2022 are all accurate and complete.   Signed, Skeet Latch, MD  06/04/2022 12:41 PM  [facility]

## 2022-06-08 ENCOUNTER — Telehealth: Payer: Self-pay | Admitting: Family

## 2022-06-08 NOTE — Telephone Encounter (Signed)
Pt states pcp filled out fmla for her daughter stating that she could miss work to take her mother to dr's appts back in August. She is wondering if we still have that paperwork and if she can come get a copy. Please advise.

## 2022-06-09 NOTE — Telephone Encounter (Signed)
I have called the pt and I have informed her that we have sent in some forms for the daughter and the only ones we have completed was for pt. I have placed a copy in the scan bin.   Pt stated dont worry about it for now. I have informed her that if she needed anything to let us know.

## 2022-06-12 ENCOUNTER — Ambulatory Visit (HOSPITAL_BASED_OUTPATIENT_CLINIC_OR_DEPARTMENT_OTHER)
Admission: RE | Admit: 2022-06-12 | Discharge: 2022-06-12 | Disposition: A | Discharge status: Home / Self Care | Payer: 59 | Source: Ambulatory Visit | Attending: Cardiovascular Disease | Admitting: Cardiovascular Disease

## 2022-06-12 DIAGNOSIS — R1011 Right upper quadrant pain: Secondary | ICD-10-CM | POA: Diagnosis present

## 2022-06-12 DIAGNOSIS — R110 Nausea: Secondary | ICD-10-CM | POA: Diagnosis present

## 2022-06-12 LAB — COMPREHENSIVE METABOLIC PANEL
ALT: 78 IU/L — ABNORMAL HIGH (ref 0–32)
AST: 65 IU/L — ABNORMAL HIGH (ref 0–40)
Albumin/Globulin Ratio: 0.7 — ABNORMAL LOW (ref 1.2–2.2)
Albumin: 3.2 g/dL — ABNORMAL LOW (ref 3.9–4.9)
Alkaline Phosphatase: 158 IU/L — ABNORMAL HIGH (ref 44–121)
BUN/Creatinine Ratio: 9 — ABNORMAL LOW (ref 12–28)
BUN: 27 mg/dL (ref 8–27)
Bilirubin Total: 0.2 mg/dL (ref 0.0–1.2)
CO2: 26 mmol/L (ref 20–29)
Calcium: 9.2 mg/dL (ref 8.7–10.3)
Chloride: 96 mmol/L (ref 96–106)
Creatinine, Ser: 2.95 mg/dL — ABNORMAL HIGH (ref 0.57–1.00)
Globulin, Total: 4.3 g/dL (ref 1.5–4.5)
Glucose: 346 mg/dL — ABNORMAL HIGH (ref 70–99)
Potassium: 5.1 mmol/L (ref 3.5–5.2)
Sodium: 136 mmol/L (ref 134–144)
Total Protein: 7.5 g/dL (ref 6.0–8.5)
eGFR: 17 mL/min/{1.73_m2} — ABNORMAL LOW (ref >59)

## 2022-06-15 ENCOUNTER — Telehealth (HOSPITAL_BASED_OUTPATIENT_CLINIC_OR_DEPARTMENT_OTHER): Payer: Self-pay | Admitting: *Deleted

## 2022-06-15 DIAGNOSIS — R110 Nausea: Secondary | ICD-10-CM

## 2022-06-15 DIAGNOSIS — R935 Abnormal findings on diagnostic imaging of other abdominal regions, including retroperitoneum: Secondary | ICD-10-CM

## 2022-06-15 DIAGNOSIS — R1011 Right upper quadrant pain: Secondary | ICD-10-CM

## 2022-06-15 DIAGNOSIS — R7989 Other specified abnormal findings of blood chemistry: Secondary | ICD-10-CM

## 2022-06-15 NOTE — Telephone Encounter (Signed)
-----   Message from Chilton Si, MD sent at 06/15/2022  9:40 AM EST ----- Kidney function abnormal but stable.  Liver function is more abnormal.  Right upper quadrant ultrasound was also normal.  Recommend getting the abdominal MRI that was recommended and that study and referral to GI.

## 2022-06-15 NOTE — Telephone Encounter (Signed)
Advised patient, verbalized understanding Orders placed for MRI and referral for GI

## 2022-06-15 NOTE — Telephone Encounter (Signed)
-----   Message from Chilton Si, MD sent at 06/15/2022  9:44 AM EST ----- Ultrasound is abnormal.  Recommend MRI of the abdomen with and without contrast to better evaluate.  The bile duct is also abnormal.  Please refer to GI urgently

## 2022-06-16 ENCOUNTER — Encounter (HOSPITAL_BASED_OUTPATIENT_CLINIC_OR_DEPARTMENT_OTHER): Payer: Self-pay

## 2022-06-16 ENCOUNTER — Encounter: Payer: Self-pay | Admitting: Gastroenterology

## 2022-06-30 ENCOUNTER — Telehealth (HOSPITAL_BASED_OUTPATIENT_CLINIC_OR_DEPARTMENT_OTHER): Payer: Self-pay | Admitting: Cardiovascular Disease

## 2022-06-30 NOTE — Telephone Encounter (Signed)
Left  message regarding the Thursday 07/16/22 4:00 pm MR Abdomen w/wo contrast appointment at [facility].  Arrival time is 3:45 pm 1st floor admissions office for check in.  NPO 4 hours prior to study.  Requested patient call with questions or concerns.

## 2022-07-01 ENCOUNTER — Other Ambulatory Visit: Payer: Self-pay

## 2022-07-01 MED ORDER — TOUJEO MAX SOLOSTAR 300 UNIT/ML ~~LOC~~ SOPN: 64.0000 [IU] | PEN_INJECTOR | Freq: Two times a day (BID) | SUBCUTANEOUS | 1 refills | Status: DC

## 2022-07-08 ENCOUNTER — Ambulatory Visit (HOSPITAL_BASED_OUTPATIENT_CLINIC_OR_DEPARTMENT_OTHER): Payer: 59

## 2022-07-16 ENCOUNTER — Ambulatory Visit (HOSPITAL_COMMUNITY): Admission: RE | Admit: 2022-07-16 | Payer: 59 | Source: Ambulatory Visit

## 2022-07-16 ENCOUNTER — Other Ambulatory Visit: Payer: Self-pay | Admitting: Family

## 2022-07-16 ENCOUNTER — Other Ambulatory Visit: Payer: Self-pay | Admitting: Cardiovascular Disease

## 2022-07-17 NOTE — Telephone Encounter (Signed)
Rx request sent to pharmacy.  

## 2022-07-25 ENCOUNTER — Other Ambulatory Visit: Payer: Self-pay | Admitting: Cardiovascular Disease

## 2022-07-28 NOTE — Telephone Encounter (Signed)
Patient of Dr. Turner. Please review for refill. Thank you!  

## 2022-07-29 ENCOUNTER — Encounter: Payer: Self-pay | Admitting: Gastroenterology

## 2022-07-29 ENCOUNTER — Other Ambulatory Visit (INDEPENDENT_AMBULATORY_CARE_PROVIDER_SITE_OTHER): Payer: 59

## 2022-07-29 ENCOUNTER — Ambulatory Visit: Payer: 59 | Admitting: Gastroenterology

## 2022-07-29 VITALS — BP 146/86 | HR 79 | Ht 60.0 in | Wt 265.0 lb

## 2022-07-29 DIAGNOSIS — R7989 Other specified abnormal findings of blood chemistry: Secondary | ICD-10-CM | POA: Diagnosis not present

## 2022-07-29 DIAGNOSIS — K838 Other specified diseases of biliary tract: Secondary | ICD-10-CM | POA: Diagnosis not present

## 2022-07-29 DIAGNOSIS — K769 Liver disease, unspecified: Secondary | ICD-10-CM

## 2022-07-29 LAB — HEPATIC FUNCTION PANEL
ALT: 8 U/L (ref 0–35)
AST: 13 U/L (ref 0–37)
Albumin: 3.4 g/dL — ABNORMAL LOW (ref 3.5–5.2)
Alkaline Phosphatase: 117 U/L (ref 39–117)
Bilirubin, Direct: 0.1 mg/dL (ref 0.0–0.3)
Total Bilirubin: 0.4 mg/dL (ref 0.2–1.2)
Total Protein: 7.9 g/dL (ref 6.0–8.3)

## 2022-07-29 NOTE — Patient Instructions (Signed)
Your provider has requested that you go to the basement level for lab work before leaving today. Press "B" on the elevator. The lab is located at the first door on the left as you exit the elevator.   You have been scheduled for an MRI at [facility] on Tuesday 08/11/22. Your appointment time is 4 pm. Please arrive to admitting (at main entrance of the hospital) 30 minutes prior to your appointment time for registration purposes. Please make certain not to have anything to eat or drink 6 hours prior to your test. In addition, if you have any metal in your body, have a pacemaker or defibrillator, please be sure to let your ordering physician know. This test typically takes 45 minutes to 1 hour to complete. Should you need to reschedule, please call [phone] to do so.  _______________________________________________________  If you are age 13 or older, your body mass index should be between 23-30. Your Body mass index is 51.75 kg/m. If this is out of the aforementioned range listed, please consider follow up with your Primary Care Provider.  If you are age 72 or younger, your body mass index should be between 19-25. Your Body mass index is 51.75 kg/m. If this is out of the aformentioned range listed, please consider follow up with your Primary Care Provider.   ________________________________________________________  The [facility] providers would like to encourage you to use [facility] to communicate with providers for non-urgent requests or questions.  Due to long hold times on the telephone, sending your provider a message by [facility] may be a faster and more efficient way to get a response.  Please allow 48 business hours for a response.  Please remember that this is for non-urgent requests.  _______________________________________________________

## 2022-07-29 NOTE — Progress Notes (Addendum)
07/29/2022 CATRICIA SCHEERER [id number] [birth year]   HISTORY OF PRESENT ILLNESS:  This is a 62 year old female who is new to our office.  She was actually referred here by Dr. Oval Linsey of cardiology for evaluation of abnormal ultrasound of hte liver and elevated LFTs.  The patient describes having sudden onset of vomiting and upper abdominal pain in early November.  She had seen her cardiologist around that time and they ordered an ultrasound that showed a liver lesion measuring 6.2 x 2.9 x 5.1 cm central liver lesion that is questionably solid mass or complex fluid collection and mild common bile duct dilatation at 7.5 mm.   She also had mild elevation of AST, ALT, and alk phos at 65, 78, and 158 respectively.  Total bili normal.  MRI of the abdomen has already been ordered and scheduled for 1/16.  She says that her abdominal pain and vomiting has completely resolved.  She says she has been feeling well since mid November.  She did make some changes to her diet, cutting out greasy foods, etc.  She does have a family history of colon cancer in her father.  Her last colonoscopy was in 1990.  No bowel issues or complaints.   Past Medical History:  Diagnosis Date   Allergic rhinitis    Asthma    Atypical chest pain    a. 03/2014: normal nuclear stress test.   Chronic diastolic CHF (congestive heart failure) ([place])    a. Dx 03/2014 with echo - moderate LVH, EF 12-45%, grade 1 diastolic dysfunction, mildly dilated LA.   Chronic diastolic heart failure ([place]) 03/07/2021   CKD (chronic kidney disease), stage II    Diabetes mellitus type II    GERD (gastroesophageal reflux disease)    Headache(784.0)    when my bp is up   Hypertension    Hypertensive heart disease    Microcytic anemia    OSA (obstructive sleep apnea)    severe with AHI 77/hr with nocturnal hypoxemia   Osteoarthritis    Right upper quadrant pain 06/04/2022   Past Surgical History:  Procedure Laterality Date   SHOULDER  ARTHROSCOPY Bilateral    TUBAL LIGATION      reports that she quit smoking about 11 years ago. Her smoking use included cigarettes. She has never used smokeless tobacco. She reports that she does not drink alcohol and does not use drugs. family history includes Cancer in her father and another family member; Diabetes in an other family member; Heart attack in her mother; Hypertension in her brother, maternal grandfather, maternal grandmother, mother, and another family member; Stroke in an other family member. Allergies  Allergen Reactions   Lisinopril Other (See Comments)    Headache   Semaglutide(0.25 Or 0.34m-Dos) Diarrhea   Latex Itching and Rash      Outpatient Encounter Medications as of 07/29/2022  Medication Sig   allopurinol (ZYLOPRIM) 300 MG tablet TAKE 1 TABLET BY MOUTH DAILY   amLODipine (NORVASC) 10 MG tablet Take 1 tablet (10 mg total) by mouth daily.   atorvastatin (LIPITOR) 20 MG tablet TAKE 1 TABLET BY MOUTH AT  BEDTIME NEED APPOINTMENT   blood glucose meter kit and supplies Dispense based on patient and insurance preference. Use up to four times daily as directed. (FOR ICD-10 E10.9, E11.9).   carvedilol (COREG) 6.25 MG tablet TAKE 1 TABLET BY MOUTH TWICE  DAILY WITH MEALS   Continuous Blood Gluc Sensor (FREESTYLE LIBRE 3 SENSOR) MISC PLACE 1 SENSOR  ON THE SKIN EVERY 14 DAYS AND USE TO CHECK BLOOD  GLUCOSE CONTINUOUSLY   FARXIGA 10 MG TABS tablet Take 10 mg by mouth daily.   fluticasone (FLONASE) 50 MCG/ACT nasal spray Place 2 sprays into both nostrils daily. (Patient taking differently: Place 2 sprays into both nostrils daily as needed.)   furosemide (LASIX) 40 MG tablet Take 40 mg by mouth daily.   hydrALAZINE (APRESOLINE) 100 MG tablet TAKE 1 TABLET BY MOUTH 3 TIMES  DAILY   insulin glargine, 2 Unit Dial, (TOUJEO MAX SOLOSTAR) 300 UNIT/ML Solostar Pen Inject 64 Units into the skin 2 (two) times daily.   losartan (COZAAR) 50 MG tablet Take 1 tablet (50 mg total) by mouth  daily.   Vitamin D, Ergocalciferol, (DRISDOL) 1.25 MG (50000 UNIT) CAPS capsule Take 50,000 Units by mouth once a week.   Calcium Carbonate Antacid (MAALOX) 600 MG chewable tablet Chew 1 tablet (600 mg total) by mouth every 8 (eight) hours as needed for heartburn. (Patient not taking: Reported on 07/29/2022)   dicyclomine (BENTYL) 20 MG tablet Take 1 tablet (20 mg total) by mouth 2 (two) times daily. (Patient not taking: Reported on 07/29/2022)   ondansetron (ZOFRAN-ODT) 4 MG disintegrating tablet Take 1 tablet (4 mg total) by mouth every 8 (eight) hours as needed. (Patient not taking: Reported on 07/29/2022)   pantoprazole (PROTONIX) 40 MG tablet Take 1 tablet (40 mg total) by mouth daily. (Patient not taking: Reported on 07/29/2022)   No facility-administered encounter medications on file as of 07/29/2022.    REVIEW OF SYSTEMS  : All other systems reviewed and negative except where noted in the History of Present Illness.   PHYSICAL EXAM: BP (!) 146/86   Pulse 79   Ht 5' (1.524 m)   Wt 265 lb (120.2 kg)   LMP 02/23/2012   BMI 51.75 kg/m  General: Well developed female in no acute distress Head: Normocephalic and atraumatic Eyes:  Sclerae anicteric, conjunctiva pink. Ears: Normal auditory acuity Lungs: Clear throughout to auscultation; no W/R/R. Heart: Regular rate and rhythm; no M/R/G. Abdomen: Soft, non-distended.  BS present.  Minimal epigastric TTP. Musculoskeletal: Symmetrical with no gross deformities  Skin: No lesions on visible extremities Extremities: No edema  Neurological: Alert oriented x 4, grossly non-focal Psychological:  Alert and cooperative. Normal mood and affect  ASSESSMENT AND PLAN: *Liver lesion:  Ultrasound showing a 6.2 x 2.9 x 5.1 cm central liver lesion that is questionably solid mass or complex fluid collection, mild common bile duct dilatation at 7.5 mm, and mild elevation of AST, ALT, and alk phos.  MRI of the abdomen has already been ordered and scheduled for  1/16.  We are going to change the order to MRI abdomen/MRCP.  Will repeat LFTs today and check an AFP and a CA 19-9. *Family history of colon cancer in her father: Her last colonoscopy was probably in 1990.  She says the she will be agreeable to schedule colonoscopy after having the MRI performed and ensure that everything looks good there.  This will be need to be done at [facility] due to BMI greater than 50.   CC:  Skeet Latch, MD

## 2022-07-30 NOTE — Progress Notes (Signed)
____________________________________________________________  Attending physician addendum:  Thank you for sending this case to me. I have reviewed the entire note and agree with the plan.  She would have to go on my waiting list for hospital outpatient procedures, which currently is about a 2-3 month wait period.  Wilfrid Lund, MD  ____________________________________________________________

## 2022-07-31 LAB — AFP TUMOR MARKER: AFP-Tumor Marker: 7.1 ng/mL — ABNORMAL HIGH

## 2022-07-31 LAB — CANCER ANTIGEN 19-9: CA 19-9: 81 U/mL — ABNORMAL HIGH (ref <34)

## 2022-08-05 ENCOUNTER — Telehealth: Payer: Self-pay | Admitting: Gastroenterology

## 2022-08-05 NOTE — Telephone Encounter (Signed)
The pt wanted to discuss payment plan for MRI.  I have given her the number to call to discuss with radiology scheduling.

## 2022-08-05 NOTE — Telephone Encounter (Signed)
PT is calling in about mri approval. Has some questions Please advise

## 2022-08-07 DIAGNOSIS — I5032 Chronic diastolic (congestive) heart failure: Secondary | ICD-10-CM | POA: Diagnosis not present

## 2022-08-07 DIAGNOSIS — R809 Proteinuria, unspecified: Secondary | ICD-10-CM | POA: Diagnosis not present

## 2022-08-07 DIAGNOSIS — N2581 Secondary hyperparathyroidism of renal origin: Secondary | ICD-10-CM | POA: Diagnosis not present

## 2022-08-07 DIAGNOSIS — I129 Hypertensive chronic kidney disease with stage 1 through stage 4 chronic kidney disease, or unspecified chronic kidney disease: Secondary | ICD-10-CM | POA: Diagnosis not present

## 2022-08-07 DIAGNOSIS — E1122 Type 2 diabetes mellitus with diabetic chronic kidney disease: Secondary | ICD-10-CM | POA: Diagnosis not present

## 2022-08-07 DIAGNOSIS — R1900 Intra-abdominal and pelvic swelling, mass and lump, unspecified site: Secondary | ICD-10-CM | POA: Diagnosis not present

## 2022-08-07 DIAGNOSIS — N184 Chronic kidney disease, stage 4 (severe): Secondary | ICD-10-CM | POA: Diagnosis not present

## 2022-08-07 DIAGNOSIS — D631 Anemia in chronic kidney disease: Secondary | ICD-10-CM | POA: Diagnosis not present

## 2022-08-07 DIAGNOSIS — R300 Dysuria: Secondary | ICD-10-CM | POA: Diagnosis not present

## 2022-08-08 LAB — LAB REPORT - SCANNED
Creatinine, POC: 60.4 mg/dL
EGFR: 23
Protein/Creatinine Ratio: 7103

## 2022-08-11 ENCOUNTER — Ambulatory Visit (HOSPITAL_COMMUNITY): Payer: 59

## 2022-08-11 NOTE — Telephone Encounter (Signed)
The pt has been provided the number to reschedule her MRI.  The pt thanked me for calling.

## 2022-08-11 NOTE — Telephone Encounter (Signed)
Patient called states she has an MRI schedule for today but is really sick with a cold and would like it rescheduled as an open MRI.

## 2022-08-25 ENCOUNTER — Ambulatory Visit (HOSPITAL_COMMUNITY): Admission: RE | Admit: 2022-08-25 | Payer: Self-pay | Source: Ambulatory Visit

## 2022-08-27 DIAGNOSIS — E113511 Type 2 diabetes mellitus with proliferative diabetic retinopathy with macular edema, right eye: Secondary | ICD-10-CM | POA: Diagnosis not present

## 2022-08-27 DIAGNOSIS — H3582 Retinal ischemia: Secondary | ICD-10-CM | POA: Diagnosis not present

## 2022-08-27 DIAGNOSIS — H35033 Hypertensive retinopathy, bilateral: Secondary | ICD-10-CM | POA: Diagnosis not present

## 2022-08-27 DIAGNOSIS — E113512 Type 2 diabetes mellitus with proliferative diabetic retinopathy with macular edema, left eye: Secondary | ICD-10-CM | POA: Diagnosis not present

## 2022-09-22 ENCOUNTER — Ambulatory Visit: Payer: Self-pay | Admitting: Internal Medicine

## 2022-10-21 ENCOUNTER — Encounter (HOSPITAL_BASED_OUTPATIENT_CLINIC_OR_DEPARTMENT_OTHER): Payer: Self-pay | Admitting: Cardiovascular Disease

## 2022-11-19 DIAGNOSIS — H35033 Hypertensive retinopathy, bilateral: Secondary | ICD-10-CM | POA: Diagnosis not present

## 2022-11-19 DIAGNOSIS — E113511 Type 2 diabetes mellitus with proliferative diabetic retinopathy with macular edema, right eye: Secondary | ICD-10-CM | POA: Diagnosis not present

## 2022-11-19 DIAGNOSIS — E113512 Type 2 diabetes mellitus with proliferative diabetic retinopathy with macular edema, left eye: Secondary | ICD-10-CM | POA: Diagnosis not present

## 2022-11-19 DIAGNOSIS — H3582 Retinal ischemia: Secondary | ICD-10-CM | POA: Diagnosis not present

## 2022-11-27 ENCOUNTER — Telehealth: Payer: Self-pay

## 2022-11-27 ENCOUNTER — Encounter: Payer: Self-pay | Admitting: Family

## 2022-11-27 NOTE — Progress Notes (Signed)
   Care Guide Note  11/27/2022 Name: Lori Mclaughlin MRN: [id number] DOB: [birth year]  Referred by: Olive Bass, FNP Reason for referral : Care Coordination (Outreach to schedule with Pharm D New [facility] DM )   Lori Mclaughlin is a 62 y.o. year old female who is a primary care patient of Olive Bass, FNP. Lori Mclaughlin was referred to the pharmacist for assistance related to DM.    Successful contact was made with the patient to discuss pharmacy services including being ready for the pharmacist to call at least 5 minutes before the scheduled appointment time, to have medication bottles and any blood sugar or blood pressure readings ready for review. The patient agreed to meet with the pharmacist via with the pharmacist via telephone visit on (date/time).  12/09/2022  Penne Lash, RMA Care Guide [facility]  [place], [place] [address] Direct Dial: [phone] [email or web address]

## 2022-12-09 ENCOUNTER — Telehealth: Payer: Medicaid Other

## 2022-12-09 ENCOUNTER — Telehealth: Payer: Self-pay | Admitting: Pharmacist

## 2022-12-09 NOTE — Telephone Encounter (Signed)
Unsuccessful phone visit - patient was scheduled with Clinical Pharmacist Practitioner to review medications and Diabetes.  No answer - LM on VM with CB# [phone] or [phone]

## 2022-12-10 ENCOUNTER — Other Ambulatory Visit (HOSPITAL_COMMUNITY): Payer: Self-pay

## 2022-12-10 ENCOUNTER — Telehealth: Payer: Self-pay | Admitting: Internal Medicine

## 2022-12-10 ENCOUNTER — Encounter: Payer: Self-pay | Admitting: Internal Medicine

## 2022-12-10 ENCOUNTER — Telehealth: Payer: Self-pay

## 2022-12-10 ENCOUNTER — Ambulatory Visit (INDEPENDENT_AMBULATORY_CARE_PROVIDER_SITE_OTHER): Payer: Medicaid Other | Admitting: Internal Medicine

## 2022-12-10 VITALS — BP 144/88 | HR 67 | Ht 60.0 in | Wt 252.0 lb

## 2022-12-10 DIAGNOSIS — E1165 Type 2 diabetes mellitus with hyperglycemia: Secondary | ICD-10-CM

## 2022-12-10 DIAGNOSIS — Z794 Long term (current) use of insulin: Secondary | ICD-10-CM

## 2022-12-10 DIAGNOSIS — E1142 Type 2 diabetes mellitus with diabetic polyneuropathy: Secondary | ICD-10-CM

## 2022-12-10 DIAGNOSIS — N184 Chronic kidney disease, stage 4 (severe): Secondary | ICD-10-CM

## 2022-12-10 DIAGNOSIS — E1122 Type 2 diabetes mellitus with diabetic chronic kidney disease: Secondary | ICD-10-CM | POA: Diagnosis not present

## 2022-12-10 LAB — POCT GLYCOSYLATED HEMOGLOBIN (HGB A1C): HbA1c POC (<> result, manual entry): >15 % (ref 4.0–5.6)

## 2022-12-10 LAB — POCT GLUCOSE (DEVICE FOR HOME USE): POC Glucose: 320 mg/dl — AB (ref 70–99)

## 2022-12-10 MED ORDER — DAPAGLIFLOZIN PROPANEDIOL 10 MG PO TABS: 10.0000 mg | ORAL_TABLET | Freq: Every day | ORAL | 2 refills | Status: DC

## 2022-12-10 MED ORDER — INSULIN PEN NEEDLE 32G X 4 MM MISC: 1.0000 | Freq: Four times a day (QID) | 2 refills | Status: DC

## 2022-12-10 MED ORDER — TOUJEO MAX SOLOSTAR 300 UNIT/ML ~~LOC~~ SOPN: 50.0000 [IU] | PEN_INJECTOR | Freq: Every day | SUBCUTANEOUS | 2 refills | Status: DC

## 2022-12-10 MED ORDER — NOVOLOG FLEXPEN 100 UNIT/ML ~~LOC~~ SOPN: PEN_INJECTOR | SUBCUTANEOUS | 3 refills | Status: DC

## 2022-12-10 MED ORDER — SEMAGLUTIDE(0.25 OR 0.5MG/DOS) 2 MG/3ML ~~LOC~~ SOPN: 0.5000 mg | PEN_INJECTOR | SUBCUTANEOUS | 3 refills | Status: DC

## 2022-12-10 MED ORDER — DEXCOM G7 SENSOR MISC: 1.0000 | 3 refills | Status: DC

## 2022-12-10 NOTE — Telephone Encounter (Signed)
Patient Advocate Encounter   Received notification from [facility] that prior authorization is required for Dexcom G7 sensor  Submitted: 12/10/22 Key [id number]  Status is pending

## 2022-12-10 NOTE — Progress Notes (Signed)
Name: Lori Mclaughlin  Age/ Sex: 62 y.o., female   MRN/ DOB: [id number], 1961-04-03     PCP: Olive Bass, FNP   Reason for Endocrinology Evaluation: Type 2 Diabetes Mellitus  Initial Endocrine Consultative Visit: 10/19/2017    PATIENT IDENTIFIER: Lori Mclaughlin is a 62 y.o. female with a past medical history of DM, HTN, dyslipidemia. The patient has followed with Endocrinology clinic since 10/19/2017 for consultative assistance with management of her diabetes.  DIABETIC HISTORY:  Lori Mclaughlin was diagnosed with DM 2000, she has been on insulin since 2018. Her hemoglobin A1c has ranged from 7.5% in 2011, peaking at >15% in 2024.  Per her previous endocrinologist notes, patient did poorly on multiple daily injections and had resorted to once daily injection of insulin  The patient was followed by Dr. Everardo All from 2019 until 04/2021 SUBJECTIVE:   During the last visit (05/08/2021): Saw Dr. Everardo All  Today (12/10/2022): Lori Mclaughlin  She checks her blood sugars 0 times daily. The patient has not had hypoglycemic episodes since the last clinic visit.  Patient was seen by GI for further evaluation of 6.2 cm liver lesion on ultrasound  Denies recent nausea or vomiting   Denies constipation or diarrhea  Denies pancreatitis  She had been out of insulin since 04/2022  She eats 1-2 times a day, drinks sugar sweetened beverages   She has noted tingling and numbness of the feet that have worsened 08/2022  HOME DIABETES REGIMEN:  Farxiga 10 mg daily Toujeo 64 units twice daily     Statin: Yes ACE-I/ARB: Yes    METER DOWNLOAD SUMMARY: n/a  DIABETIC COMPLICATIONS: Microvascular complications:  CKD IV, neuropathy Denies:  Last Eye Exam:  Macrovascular complications:   Denies: CAD, CVA, PVD   HISTORY:  Past Medical History:  Past Medical History:  Diagnosis Date   Allergic rhinitis    Asthma    Atypical chest pain    a. 03/2014:  normal nuclear stress test.   Chronic diastolic CHF (congestive heart failure) (HCC)    a. Dx 03/2014 with echo - moderate LVH, EF 55-60%, grade 1 diastolic dysfunction, mildly dilated LA.   Chronic diastolic heart failure (HCC) 03/07/2021   CKD (chronic kidney disease), stage II    Diabetes mellitus type II    GERD (gastroesophageal reflux disease)    Headache(784.0)    when my bp is up   Hypertension    Hypertensive heart disease    Microcytic anemia    OSA (obstructive sleep apnea)    severe with AHI 77/hr with nocturnal hypoxemia   Osteoarthritis    Right upper quadrant pain 06/04/2022   Past Surgical History:  Past Surgical History:  Procedure Laterality Date   SHOULDER ARTHROSCOPY Bilateral    TUBAL LIGATION     Social History:  reports that she quit smoking about 12 years ago. Her smoking use included cigarettes. She has never used smokeless tobacco. She reports that she does not drink alcohol and does not use drugs. Family History:  Family History  Problem Relation Age of Onset   Heart attack Mother    Hypertension Mother    Cancer Father    Hypertension Brother    Hypertension Maternal Grandmother    Hypertension Maternal Grandfather    Diabetes Other    Hypertension Other    Stroke Other    Cancer Other        colon; prostate   Kidney disease Neg Hx    Alcohol abuse Neg  Hx    Asthma Neg Hx    COPD Neg Hx    Depression Neg Hx    Drug abuse Neg Hx    Early death Neg Hx    Hearing loss Neg Hx    Heart disease Neg Hx    Hyperlipidemia Neg Hx      HOME MEDICATIONS: Allergies as of 12/10/2022       Reactions   Lisinopril Other (See Comments)   Headache   Latex Itching, Rash        Medication List        Accurate as of Dec 10, 2022 11:11 AM. If you have any questions, ask your nurse or doctor.          allopurinol 300 MG tablet Commonly known as: ZYLOPRIM TAKE 1 TABLET BY MOUTH DAILY   amLODipine 10 MG tablet Commonly known as:  NORVASC Take 1 tablet (10 mg total) by mouth daily.   atorvastatin 20 MG tablet Commonly known as: LIPITOR TAKE 1 TABLET BY MOUTH AT  BEDTIME NEED APPOINTMENT   blood glucose meter kit and supplies Dispense based on patient and insurance preference. Use up to four times daily as directed. (FOR ICD-10 E10.9, E11.9).   carvedilol 6.25 MG tablet Commonly known as: COREG TAKE 1 TABLET BY MOUTH TWICE  DAILY WITH MEALS   dapagliflozin propanediol 10 MG Tabs tablet Commonly known as: Farxiga Take 1 tablet (10 mg total) by mouth daily.   Dexcom G7 Sensor Misc 1 Device by Does not apply route as directed. What changed:  how much to take how to take this when to take this additional instructions Changed by: Scarlette Shorts, MD   dicyclomine 20 MG tablet Commonly known as: BENTYL Take 1 tablet (20 mg total) by mouth 2 (two) times daily.   fluticasone 50 MCG/ACT nasal spray Commonly known as: FLONASE Place 2 sprays into both nostrils daily. What changed:  when to take this reasons to take this   furosemide 40 MG tablet Commonly known as: LASIX Take 40 mg by mouth daily.   hydrALAZINE 100 MG tablet Commonly known as: APRESOLINE TAKE 1 TABLET BY MOUTH 3 TIMES  DAILY   Insulin Pen Needle 32G X 4 MM Misc 1 Device by Does not apply route in the morning, at noon, in the evening, and at bedtime. Started by: Scarlette Shorts, MD   losartan 50 MG tablet Commonly known as: COZAAR Take 1 tablet (50 mg total) by mouth daily.   Maalox 600 MG chewable tablet Generic drug: Calcium Carbonate Antacid Chew 1 tablet (600 mg total) by mouth every 8 (eight) hours as needed for heartburn.   NovoLOG FlexPen 100 UNIT/ML FlexPen Generic drug: insulin aspart Max daily 45 units daily Started by: Scarlette Shorts, MD   ondansetron 4 MG disintegrating tablet Commonly known as: ZOFRAN-ODT Take 1 tablet (4 mg total) by mouth every 8 (eight) hours as needed.   pantoprazole 40 MG  tablet Commonly known as: Protonix Take 1 tablet (40 mg total) by mouth daily.   Semaglutide(0.25 or 0.5MG /DOS) 2 MG/3ML Sopn Inject 0.5 mg into the skin once a week. Started by: Scarlette Shorts, MD   Toujeo Max SoloStar 300 UNIT/ML Solostar Pen Generic drug: insulin glargine (2 Unit Dial) Inject 50 Units into the skin daily in the afternoon. What changed:  how much to take when to take this Changed by: Scarlette Shorts, MD   Vitamin D (Ergocalciferol) 1.25 MG (50000 UNIT) Caps capsule Commonly  known as: DRISDOL Take 50,000 Units by mouth once a week.         OBJECTIVE:   Vital Signs: BP (!) 144/88 (BP Location: Right Arm, Patient Position: Sitting, Cuff Size: Large)   Pulse 67   Ht 5' (1.524 m)   Wt 252 lb (114.3 kg)   LMP 02/23/2012   SpO2 97%   BMI 49.22 kg/m   Wt Readings from Last 3 Encounters:  12/10/22 252 lb (114.3 kg)  07/29/22 265 lb (120.2 kg)  06/04/22 265 lb 8 oz (120.4 kg)     Exam: General: Pt appears well and is in NAD  Neck: General: Supple without adenopathy. Thyroid: Thyroid size normal.  No goiter or nodules appreciated.   Lungs: Clear with good BS bilat   Heart: RRR   Extremities: Trace  pretibial edema.   Neuro: MS is good with appropriate affect, pt is alert and Ox3    DM foot exam: 12/10/2022  The skin of the feet is intact without sores or ulcerations. The pedal pulses are decreased  The sensation is intact to a screening 5.07, 10 gram monofilament bilaterally        DATA REVIEWED:  Lab Results  Component Value Date   HGBA1C >15.0 12/10/2022   HGBA1C >14.0 (H) 03/18/2022   HGBA1C 7.4 (A) 05/08/2021    Latest Reference Range & Units 06/12/22 11:33 08/08/22 13:15  Sodium 134 - 144 mmol/L 136   Potassium 3.5 - 5.2 mmol/L 5.1   Chloride 96 - 106 mmol/L 96   CO2 20 - 29 mmol/L 26   Glucose 70 - 99 mg/dL 098 (H)   BUN 8 - 27 mg/dL 27   Creatinine 1.19 - 1.00 mg/dL 1.47 (H)   Calcium 8.7 - 10.3 mg/dL 9.2    BUN/Creatinine Ratio 12 - 28  9 (L)   eGFR >59 mL/min/1.73 17 (L) 23.0 (E)    Old records , labs and images have been reviewed.      ASSESSMENT / PLAN / RECOMMENDATIONS:   1) Type 2 Diabetes Mellitus, Poorly controlled, With CKD IV and neuropathic complications - Most recent A1c of 14.0 %. Goal A1c < 7.0 %.     -Patient continues with poorly controlled diabetes due to medication nonadherence and dietary indiscretions -Patient was advised to avoid all sugar sweetened beverages -She has been without insulin for approximately 6 months -She does not check glucose at home, Dexcom prescription was sent to the pharmacy and a sample sensor was provided to the patient, I have briefly went over the steps to apply it, she was advised that we will be a pamphlet and the sensor package and needs to be the steps -She had Ozempic on her allergy list for diarrhea, but patient denies being on Ozempic in the past, she has agreed to try Ozempic as below, cautioned against nausea and diarrhea, should she have any side effects the patient will need to discontinue Ozempic and notify our practice -I will restart her on Toujeo at a smaller dose as below -I have also recommended starting her prandial insulin per correction scale -We did discuss risk of blindness, ESRD, neuropathy and increased risk of amputation, I did encourage the patient that improving glycemic control will slow down any more renal damage  MEDICATIONS: Start Ozempic 0.25 mg once weekly for 6 weeks, then increase to 0.5 mg weekly Continue Farxiga 10 mg daily Restart Toujeo 50 units daily Start NovoLog (BG -130/25) 3 times daily before every meal  EDUCATION /  INSTRUCTIONS: BG monitoring instructions: Patient is instructed to check her blood sugars 3 times a day, before meals. Call [facility] if: BG persistently < 70  I reviewed the Rule of 15 for the treatment of hypoglycemia in detail with the patient. Literature  supplied.    2) Diabetic complications:  Eye: Does not have known diabetic retinopathy.  Neuro/ Feet: Does  have known diabetic peripheral neuropathy .  Renal: Patient does  have known baseline CKD. She   is  on an ACEI/ARB at present.    3) Peripheral neuropathy:  -Patient is symptomatic -I did encourage the patient to improve glycemic control as this may improve her symptoms -We discussed gabapentin, but I would like to avoid this in CKD and lower extremity edema -I did offer topical gabapentin, but she would like to try OTC creams first   F/U in 2 months     I spent 45 minutes preparing to see the patient by review of recent labs, imaging and procedures, obtaining and reviewing separately obtained history, communicating with the patient/family or caregiver, ordering medications, tests or procedures, and documenting clinical information in the EHR including the differential Dx, treatment, and any further evaluation and other management    Signed electronically by: Lyndle Herrlich, MD  [facility] End[facility]  [facility] [address] [place] [place], [place] [address] Phone: [phone] FAX: [phone]   CC: Olive Bass, FNP [address] [place] [address] Phone: [phone]  Fax: [phone]  Return to Endocrinology clinic as below: Future Appointments  Date Time Provider Department Center  02/03/2023 12:10 PM Imogine Carvell, Konrad Dolores, MD [facility] None

## 2022-12-10 NOTE — Telephone Encounter (Signed)
Patient Advocate Encounter   Received notification from [facility] that prior authorization is required for Farxiga 10mg   Submitted: 12/10/2022 Key [id number]  Status is pending

## 2022-12-10 NOTE — Patient Instructions (Signed)
Start Ozempic 0.25 mg once weekly for 6 weeks, than increase to 0.5 mg if no nausea or diarrhea  Continue Farxiga 10 mg daily  Restart Toujeo 50 units ONCE daily  Novolog correctional insulin: Use the scale below to help guide you three times a day , before meals   Blood sugar before meal Number of units to inject  Less than 155 0 unit  156 -  180 1 units  181 -  205 2 units  206 -  230 3 units  231 -  255 4 units  256 -  280 5 units  281 -  305 6 units  306 -  330 7 units  331 -  355 8 units     HOW TO TREAT LOW BLOOD SUGARS (Blood sugar LESS THAN 70 MG/DL) Please follow the RULE OF 15 for the treatment of hypoglycemia treatment (when your (blood sugars are less than 70 mg/dL)   STEP 1: Take 15 grams of carbohydrates when your blood sugar is low, which includes:  3-4 GLUCOSE TABS  OR 3-4 OZ OF JUICE OR REGULAR SODA OR ONE TUBE OF GLUCOSE GEL    STEP 2: RECHECK blood sugar in 15 MINUTES STEP 3: If your blood sugar is still low at the 15 minute recheck --> then, go back to STEP 1 and treat AGAIN with another 15 grams of carbohydrates.

## 2022-12-10 NOTE — Telephone Encounter (Signed)
Can you please do PA for dexcom ?   Thanks

## 2022-12-11 NOTE — Telephone Encounter (Signed)
Pharmacy Patient Advocate Encounter  Prior Authorization for Marcelline Deist has been approved  Effective dates: 12/10/22 through 12/10/23

## 2022-12-11 NOTE — Telephone Encounter (Signed)
Pharmacy Patient Advocate Encounter  Prior Authorization for Dexcom G7 sensor has been approved   Effective through 06/12/2023

## 2022-12-15 ENCOUNTER — Telehealth: Payer: Self-pay

## 2022-12-15 NOTE — Progress Notes (Unsigned)
   Care Guide Note  12/15/2022 Name: Lori Mclaughlin MRN: [id number] DOB: [birth year]  Referred by: Olive Bass, FNP Reason for referral : Care Coordination (Outreach to reschedule with Pharm d )   Lori Mclaughlin is a 62 y.o. year old female who is a primary care patient of Olive Bass, FNP. Lori Mclaughlin was referred to the pharmacist for assistance related to DM.    An unsuccessful telephone outreach was attempted today to contact the patient who was referred to the pharmacy team for assistance with medication management. Additional attempts will be made to contact the patient.   Penne Lash, RMA Care Guide [facility]  [place], [place] [address] Direct Dial: [phone] [email or web address]

## 2023-01-25 NOTE — Progress Notes (Unsigned)
   Care Guide Note  01/25/2023 Name: Lori Mclaughlin MRN: [id number] DOB: [birth year]  Referred by: Olive Bass, FNP Reason for referral : Care Coordination (Outreach to reschedule with Pharm d )   Lori Mclaughlin is a 62 y.o. year old female who is a primary care patient of Olive Bass, FNP. [name] was referred to the pharmacist for assistance related to DM.    A second unsuccessful telephone outreach was attempted today to contact the patient who was referred to the pharmacy team for assistance with medication management. Additional attempts will be made to contact the patient.  Penne Lash, RMA Care Guide [facility]  [place], [place] [address] Direct Dial: [phone] [email or web address]

## 2023-01-27 NOTE — Progress Notes (Signed)
   Care Guide Note  01/27/2023 Name: Lori Mclaughlin MRN: [id number] DOB: 03/07/1961  Referred by: Olive Bass, FNP Reason for referral : Care Coordination (Outreach to reschedule with Pharm d )   Lori Mclaughlin is a 62 y.o. year old female who is a primary care patient of Olive Bass, FNP. Lori Mclaughlin was referred to the pharmacist for assistance related to DM.    A third unsuccessful telephone outreach was attempted today to contact the patient who was referred to the pharmacy team for assistance with medication management. The Population Health team is pleased to engage with this patient at any time in the future upon receipt of referral and should he/she be interested in assistance from the [facility] team.   Penne Lash, RMA Care Guide [facility]  [place], [place] [address] Direct Dial: [phone] [email or web address]

## 2023-02-02 ENCOUNTER — Ambulatory Visit: Payer: Medicaid Other | Admitting: Internal Medicine

## 2023-02-02 NOTE — Progress Notes (Deleted)
Name: Lori Mclaughlin  Age/ Sex: 62 y.o., female   MRN/ DOB: [id number], 1961/04/10     PCP: Olive Bass, FNP   Reason for Endocrinology Evaluation: Type 2 Diabetes Mellitus  Initial Endocrine Consultative Visit: 10/19/2017    PATIENT IDENTIFIER: Lori Mclaughlin is a 62 y.o. female with a past medical history of DM, HTN, dyslipidemia. The patient has followed with Endocrinology clinic since 10/19/2017 for consultative assistance with management of her diabetes.  DIABETIC HISTORY:  Lori Mclaughlin was diagnosed with DM 2000, she has been on insulin since 2018. Her hemoglobin A1c has ranged from 7.5% in 2011, peaking at >15% in 2024.  Per her previous endocrinologist notes, patient did poorly on multiple daily injections and had resorted to once daily injection of insulin  The patient was followed by Dr. Everardo All from 2019 until 04/2021   On her initial visit with me 11/2022 she had an A1c of 14.0%, she was already on Farxiga, she had not been taking basal insulin which restarted, I also started her on Ozempic and NovoLog per correction scale.  And was prescribed Dexcom    SUBJECTIVE:   During the last visit (12/10/2022): A1c 14.0%      Today (02/02/2023): Lori Mclaughlin  She checks her blood sugars 0 times daily. The patient has not had hypoglycemic episodes since the last clinic visit.  Patient was seen by GI for further evaluation of 6.2 cm liver lesion on ultrasound  Denies recent nausea or vomiting   Denies constipation or diarrhea  Denies pancreatitis  She had been out of insulin since 04/2022  She eats 1-2 times a day, drinks sugar sweetened beverages   She has noted tingling and numbness of the feet that have worsened 08/2022  HOME DIABETES REGIMEN:  Farxiga 10 mg daily Ozempic 0.5 mg weekly Toujeo 50 units daily NovoLog: BG -130/25 3 times daily    Statin: Yes ACE-I/ARB: Yes    METER DOWNLOAD SUMMARY: n/a  DIABETIC  COMPLICATIONS: Microvascular complications:  CKD IV, neuropathy Denies:  Last Eye Exam:  Macrovascular complications:   Denies: CAD, CVA, PVD   HISTORY:  Past Medical History:  Past Medical History:  Diagnosis Date   Allergic rhinitis    Asthma    Atypical chest pain    a. 03/2014: normal nuclear stress test.   Chronic diastolic CHF (congestive heart failure) (HCC)    a. Dx 03/2014 with echo - moderate LVH, EF 55-60%, grade 1 diastolic dysfunction, mildly dilated LA.   Chronic diastolic heart failure (HCC) 03/07/2021   CKD (chronic kidney disease), stage II    Diabetes mellitus type II    GERD (gastroesophageal reflux disease)    Headache(784.0)    when my bp is up   Hypertension    Hypertensive heart disease    Microcytic anemia    OSA (obstructive sleep apnea)    severe with AHI 77/hr with nocturnal hypoxemia   Osteoarthritis    Right upper quadrant pain 06/04/2022   Past Surgical History:  Past Surgical History:  Procedure Laterality Date   SHOULDER ARTHROSCOPY Bilateral    TUBAL LIGATION     Social History:  reports that she quit smoking about 12 years ago. Her smoking use included cigarettes. She has never used smokeless tobacco. She reports that she does not drink alcohol and does not use drugs. Family History:  Family History  Problem Relation Age of Onset   Heart attack Mother    Hypertension Mother    Cancer  Father    Hypertension Brother    Hypertension Maternal Grandmother    Hypertension Maternal Grandfather    Diabetes Other    Hypertension Other    Stroke Other    Cancer Other        colon; prostate   Kidney disease Neg Hx    Alcohol abuse Neg Hx    Asthma Neg Hx    COPD Neg Hx    Depression Neg Hx    Drug abuse Neg Hx    Early death Neg Hx    Hearing loss Neg Hx    Heart disease Neg Hx    Hyperlipidemia Neg Hx      HOME MEDICATIONS: Allergies as of 02/02/2023       Reactions   Lisinopril Other (See Comments)   Headache   Latex  Itching, Rash        Medication List        Accurate as of February 02, 2023  7:01 AM. If you have any questions, ask your nurse or doctor.          allopurinol 300 MG tablet Commonly known as: ZYLOPRIM TAKE 1 TABLET BY MOUTH DAILY   amLODipine 10 MG tablet Commonly known as: NORVASC Take 1 tablet (10 mg total) by mouth daily.   atorvastatin 20 MG tablet Commonly known as: LIPITOR TAKE 1 TABLET BY MOUTH AT  BEDTIME NEED APPOINTMENT   blood glucose meter kit and supplies Dispense based on patient and insurance preference. Use up to four times daily as directed. (FOR ICD-10 E10.9, E11.9).   carvedilol 6.25 MG tablet Commonly known as: COREG TAKE 1 TABLET BY MOUTH TWICE  DAILY WITH MEALS   dapagliflozin propanediol 10 MG Tabs tablet Commonly known as: Farxiga Take 1 tablet (10 mg total) by mouth daily.   Dexcom G7 Sensor Misc 1 Device by Does not apply route as directed.   dicyclomine 20 MG tablet Commonly known as: BENTYL Take 1 tablet (20 mg total) by mouth 2 (two) times daily.   fluticasone 50 MCG/ACT nasal spray Commonly known as: FLONASE Place 2 sprays into both nostrils daily. What changed:  when to take this reasons to take this   furosemide 40 MG tablet Commonly known as: LASIX Take 40 mg by mouth daily.   hydrALAZINE 100 MG tablet Commonly known as: APRESOLINE TAKE 1 TABLET BY MOUTH 3 TIMES  DAILY   Insulin Pen Needle 32G X 4 MM Misc 1 Device by Does not apply route in the morning, at noon, in the evening, and at bedtime.   losartan 50 MG tablet Commonly known as: COZAAR Take 1 tablet (50 mg total) by mouth daily.   Maalox 600 MG chewable tablet Generic drug: Calcium Carbonate Antacid Chew 1 tablet (600 mg total) by mouth every 8 (eight) hours as needed for heartburn.   NovoLOG FlexPen 100 UNIT/ML FlexPen Generic drug: insulin aspart Max daily 45 units daily   ondansetron 4 MG disintegrating tablet Commonly known as: ZOFRAN-ODT Take 1  tablet (4 mg total) by mouth every 8 (eight) hours as needed.   pantoprazole 40 MG tablet Commonly known as: Protonix Take 1 tablet (40 mg total) by mouth daily.   Semaglutide(0.25 or 0.5MG /DOS) 2 MG/3ML Sopn Inject 0.5 mg into the skin once a week.   Toujeo Max SoloStar 300 UNIT/ML Solostar Pen Generic drug: insulin glargine (2 Unit Dial) Inject 50 Units into the skin daily in the afternoon.   Vitamin D (Ergocalciferol) 1.25 MG (50000 UNIT) Caps  capsule Commonly known as: DRISDOL Take 50,000 Units by mouth once a week.         OBJECTIVE:   Vital Signs: LMP 02/23/2012   Wt Readings from Last 3 Encounters:  12/10/22 252 lb (114.3 kg)  07/29/22 265 lb (120.2 kg)  06/04/22 265 lb 8 oz (120.4 kg)     Exam: General: Pt appears well and is in NAD  Neck: General: Supple without adenopathy. Thyroid: Thyroid size normal.  No goiter or nodules appreciated.   Lungs: Clear with good BS bilat   Heart: RRR   Extremities: Trace  pretibial edema.   Neuro: MS is good with appropriate affect, pt is alert and Ox3    DM foot exam: 12/10/2022  The skin of the feet is intact without sores or ulcerations. The pedal pulses are decreased  The sensation is intact to a screening 5.07, 10 gram monofilament bilaterally        DATA REVIEWED:  Lab Results  Component Value Date   HGBA1C >15.0 12/10/2022   HGBA1C >14.0 (H) 03/18/2022   HGBA1C 7.4 (A) 05/08/2021    Latest Reference Range & Units 06/12/22 11:33 08/08/22 13:15  Sodium 134 - 144 mmol/L 136   Potassium 3.5 - 5.2 mmol/L 5.1   Chloride 96 - 106 mmol/L 96   CO2 20 - 29 mmol/L 26   Glucose 70 - 99 mg/dL 161 (H)   BUN 8 - 27 mg/dL 27   Creatinine 0.96 - 1.00 mg/dL 0.45 (H)   Calcium 8.7 - 10.3 mg/dL 9.2   BUN/Creatinine Ratio 12 - 28  9 (L)   eGFR >59 mL/min/1.73 17 (L) 23.0 (E)    Old records , labs and images have been reviewed.      ASSESSMENT / PLAN / RECOMMENDATIONS:   1) Type 2 Diabetes Mellitus, Poorly  controlled, With CKD IV and neuropathic complications - Most recent A1c of 14.0 %. Goal A1c < 7.0 %.     -Patient continues with poorly controlled diabetes due to medication nonadherence and dietary indiscretions -Patient was advised to avoid all sugar sweetened beverages -She has been without insulin for approximately 6 months -She does not check glucose at home, Dexcom prescription was sent to the pharmacy and a sample sensor was provided to the patient, I have briefly went over the steps to apply it, she was advised that we will be a pamphlet and the sensor package and needs to be the steps -She had Ozempic on her allergy list for diarrhea, but patient denies being on Ozempic in the past, she has agreed to try Ozempic as below, cautioned against nausea and diarrhea, should she have any side effects the patient will need to discontinue Ozempic and notify our practice -I will restart her on Toujeo at a smaller dose as below -I have also recommended starting her prandial insulin per correction scale -We did discuss risk of blindness, ESRD, neuropathy and increased risk of amputation, I did encourage the patient that improving glycemic control will slow down any more renal damage  MEDICATIONS: Start Ozempic 0.25 mg once weekly for 6 weeks, then increase to 0.5 mg weekly Continue Farxiga 10 mg daily Restart Toujeo 50 units daily Start NovoLog (BG -130/25) 3 times daily before every meal  EDUCATION / INSTRUCTIONS: BG monitoring instructions: Patient is instructed to check her blood sugars 3 times a day, before meals. Call [facility] if: BG persistently < 70  I reviewed the Rule of 15 for the treatment of hypoglycemia  in detail with the patient. Literature supplied.    2) Diabetic complications:  Eye: Does not have known diabetic retinopathy.  Neuro/ Feet: Does  have known diabetic peripheral neuropathy .  Renal: Patient does  have known baseline CKD. She   is  on an  ACEI/ARB at present.    3) Peripheral neuropathy:  -Patient is symptomatic -I did encourage the patient to improve glycemic control as this may improve her symptoms -We discussed gabapentin, but I would like to avoid this in CKD and lower extremity edema -I did offer topical gabapentin, but she would like to try OTC creams first   F/U in 2 months       Signed electronically by: Lyndle Herrlich, MD  [facility]  [facility] [address] [place] [place], [place] [address] Phone: [phone] FAX: [phone]   CC: Olive Bass, FNP [address] [place] [address] Phone: [phone]  Fax: [phone]  Return to Endocrinology clinic as below: Future Appointments  Date Time Provider Department Center  02/02/2023  9:30 AM , Konrad Dolores, MD [facility] None

## 2023-02-03 ENCOUNTER — Ambulatory Visit: Payer: Medicaid Other | Admitting: Internal Medicine

## 2023-02-05 ENCOUNTER — Encounter: Payer: Self-pay | Admitting: Internal Medicine

## 2023-02-05 ENCOUNTER — Ambulatory Visit: Payer: Medicaid Other | Admitting: Internal Medicine

## 2023-02-05 NOTE — Progress Notes (Deleted)
Name: Lori Mclaughlin  Age/ Sex: 62 y.o., female   MRN/ DOB: [id number], 24-Jan-1961     PCP: Olive Bass, FNP   Reason for Endocrinology Evaluation: Type 2 Diabetes Mellitus  Initial Endocrine Consultative Visit: 10/19/2017    PATIENT IDENTIFIER: [name] is a 62 y.o. female with a past medical history of DM, HTN, dyslipidemia. The patient has followed with Endocrinology clinic since 10/19/2017 for consultative assistance with management of her diabetes.  DIABETIC HISTORY:  [name] was diagnosed with DM 2000, she has been on insulin since 2018. Her hemoglobin A1c has ranged from 7.5% in 2011, peaking at >15% in 2024.  Per her previous endocrinologist notes, patient did poorly on multiple daily injections and had resorted to once daily injection of insulin  The patient was followed by Dr. Everardo All from 2019 until 04/2021   On her initial visit with me 11/2022 she had an A1c of 14.0%, she was already on Farxiga, she had not been taking basal insulin which restarted, I also started her on Ozempic and NovoLog per correction scale.  And was prescribed Dexcom    SUBJECTIVE:   During the last visit (12/10/2022): A1c 14.0%      Today (02/05/2023): [name]  She checks her blood sugars 0 times daily. The patient has not had hypoglycemic episodes since the last clinic visit.  Patient was seen by GI for further evaluation of 6.2 cm liver lesion on ultrasound  Denies recent nausea or vomiting   Denies constipation or diarrhea  Denies pancreatitis  She had been out of insulin since 04/2022  She eats 1-2 times a day, drinks sugar sweetened beverages   She has noted tingling and numbness of the feet that have worsened 08/2022  HOME DIABETES REGIMEN:  Farxiga 10 mg daily Ozempic 0.5 mg weekly Toujeo 50 units daily NovoLog: BG -130/25 3 times daily    Statin: Yes ACE-I/ARB: Yes    METER DOWNLOAD SUMMARY: n/a  DIABETIC  COMPLICATIONS: Microvascular complications:  CKD IV, neuropathy Denies:  Last Eye Exam:  Macrovascular complications:   Denies: CAD, CVA, PVD   HISTORY:  Past Medical History:  Past Medical History:  Diagnosis Date   Allergic rhinitis    Asthma    Atypical chest pain    a. 03/2014: normal nuclear stress test.   Chronic diastolic CHF (congestive heart failure) (HCC)    a. Dx 03/2014 with echo - moderate LVH, EF 55-60%, grade 1 diastolic dysfunction, mildly dilated LA.   Chronic diastolic heart failure (HCC) 03/07/2021   CKD (chronic kidney disease), stage II    Diabetes mellitus type II    GERD (gastroesophageal reflux disease)    Headache(784.0)    when my bp is up   Hypertension    Hypertensive heart disease    Microcytic anemia    OSA (obstructive sleep apnea)    severe with AHI 77/hr with nocturnal hypoxemia   Osteoarthritis    Right upper quadrant pain 06/04/2022   Past Surgical History:  Past Surgical History:  Procedure Laterality Date   SHOULDER ARTHROSCOPY Bilateral    TUBAL LIGATION     Social History:  reports that she quit smoking about 12 years ago. Her smoking use included cigarettes. She has never used smokeless tobacco. She reports that she does not drink alcohol and does not use drugs. Family History:  Family History  Problem Relation Age of Onset   Heart attack Mother    Hypertension Mother    Cancer  Father    Hypertension Brother    Hypertension Maternal Grandmother    Hypertension Maternal Grandfather    Diabetes Other    Hypertension Other    Stroke Other    Cancer Other        colon; prostate   Kidney disease Neg Hx    Alcohol abuse Neg Hx    Asthma Neg Hx    COPD Neg Hx    Depression Neg Hx    Drug abuse Neg Hx    Early death Neg Hx    Hearing loss Neg Hx    Heart disease Neg Hx    Hyperlipidemia Neg Hx      HOME MEDICATIONS: Allergies as of 02/05/2023       Reactions   Lisinopril Other (See Comments)   Headache   Latex  Itching, Rash        Medication List        Accurate as of February 05, 2023 11:21 AM. If you have any questions, ask your nurse or doctor.          allopurinol 300 MG tablet Commonly known as: ZYLOPRIM TAKE 1 TABLET BY MOUTH DAILY   amLODipine 10 MG tablet Commonly known as: NORVASC Take 1 tablet (10 mg total) by mouth daily.   atorvastatin 20 MG tablet Commonly known as: LIPITOR TAKE 1 TABLET BY MOUTH AT  BEDTIME NEED APPOINTMENT   blood glucose meter kit and supplies Dispense based on patient and insurance preference. Use up to four times daily as directed. (FOR ICD-10 E10.9, E11.9).   carvedilol 6.25 MG tablet Commonly known as: COREG TAKE 1 TABLET BY MOUTH TWICE  DAILY WITH MEALS   dapagliflozin propanediol 10 MG Tabs tablet Commonly known as: Farxiga Take 1 tablet (10 mg total) by mouth daily.   Dexcom G7 Sensor Misc 1 Device by Does not apply route as directed.   dicyclomine 20 MG tablet Commonly known as: BENTYL Take 1 tablet (20 mg total) by mouth 2 (two) times daily.   fluticasone 50 MCG/ACT nasal spray Commonly known as: FLONASE Place 2 sprays into both nostrils daily. What changed:  when to take this reasons to take this   furosemide 40 MG tablet Commonly known as: LASIX Take 40 mg by mouth daily.   hydrALAZINE 100 MG tablet Commonly known as: APRESOLINE TAKE 1 TABLET BY MOUTH 3 TIMES  DAILY   Insulin Pen Needle 32G X 4 MM Misc 1 Device by Does not apply route in the morning, at noon, in the evening, and at bedtime.   losartan 50 MG tablet Commonly known as: COZAAR Take 1 tablet (50 mg total) by mouth daily.   Maalox 600 MG chewable tablet Generic drug: Calcium Carbonate Antacid Chew 1 tablet (600 mg total) by mouth every 8 (eight) hours as needed for heartburn.   NovoLOG FlexPen 100 UNIT/ML FlexPen Generic drug: insulin aspart Max daily 45 units daily   ondansetron 4 MG disintegrating tablet Commonly known as: ZOFRAN-ODT Take 1  tablet (4 mg total) by mouth every 8 (eight) hours as needed.   pantoprazole 40 MG tablet Commonly known as: Protonix Take 1 tablet (40 mg total) by mouth daily.   Semaglutide(0.25 or 0.5MG /DOS) 2 MG/3ML Sopn Inject 0.5 mg into the skin once a week.   Toujeo Max SoloStar 300 UNIT/ML Solostar Pen Generic drug: insulin glargine (2 Unit Dial) Inject 50 Units into the skin daily in the afternoon.   Vitamin D (Ergocalciferol) 1.25 MG (50000 UNIT) Caps capsule  Commonly known as: DRISDOL Take 50,000 Units by mouth once a week.         OBJECTIVE:   Vital Signs: LMP 02/23/2012   Wt Readings from Last 3 Encounters:  12/10/22 252 lb (114.3 kg)  07/29/22 265 lb (120.2 kg)  06/04/22 265 lb 8 oz (120.4 kg)     Exam: General: Pt appears well and is in NAD  Neck: General: Supple without adenopathy. Thyroid: Thyroid size normal.  No goiter or nodules appreciated.   Lungs: Clear with good BS bilat   Heart: RRR   Extremities: Trace  pretibial edema.   Neuro: MS is good with appropriate affect, pt is alert and Ox3    DM foot exam: 12/10/2022  The skin of the feet is intact without sores or ulcerations. The pedal pulses are decreased  The sensation is intact to a screening 5.07, 10 gram monofilament bilaterally        DATA REVIEWED:  Lab Results  Component Value Date   HGBA1C >15.0 12/10/2022   HGBA1C >14.0 (H) 03/18/2022   HGBA1C 7.4 (A) 05/08/2021    Latest Reference Range & Units 06/12/22 11:33 08/08/22 13:15  Sodium 134 - 144 mmol/L 136   Potassium 3.5 - 5.2 mmol/L 5.1   Chloride 96 - 106 mmol/L 96   CO2 20 - 29 mmol/L 26   Glucose 70 - 99 mg/dL 161 (H)   BUN 8 - 27 mg/dL 27   Creatinine 0.96 - 1.00 mg/dL 0.45 (H)   Calcium 8.7 - 10.3 mg/dL 9.2   BUN/Creatinine Ratio 12 - 28  9 (L)   eGFR >59 mL/min/1.73 17 (L) 23.0 (E)    Old records , labs and images have been reviewed.      ASSESSMENT / PLAN / RECOMMENDATIONS:   1) Type 2 Diabetes Mellitus, Poorly  controlled, With CKD IV and neuropathic complications - Most recent A1c of 14.0 %. Goal A1c < 7.0 %.     -Patient continues with poorly controlled diabetes due to medication nonadherence and dietary indiscretions -Patient was advised to avoid all sugar sweetened beverages -She has been without insulin for approximately 6 months -She does not check glucose at home, Dexcom prescription was sent to the pharmacy and a sample sensor was provided to the patient, I have briefly went over the steps to apply it, she was advised that we will be a pamphlet and the sensor package and needs to be the steps -She had Ozempic on her allergy list for diarrhea, but patient denies being on Ozempic in the past, she has agreed to try Ozempic as below, cautioned against nausea and diarrhea, should she have any side effects the patient will need to discontinue Ozempic and notify our practice -I will restart her on Toujeo at a smaller dose as below -I have also recommended starting her prandial insulin per correction scale -We did discuss risk of blindness, ESRD, neuropathy and increased risk of amputation, I did encourage the patient that improving glycemic control will slow down any more renal damage  MEDICATIONS: Start Ozempic 0.25 mg once weekly for 6 weeks, then increase to 0.5 mg weekly Continue Farxiga 10 mg daily Restart Toujeo 50 units daily Start NovoLog (BG -130/25) 3 times daily before every meal  EDUCATION / INSTRUCTIONS: BG monitoring instructions: Patient is instructed to check her blood sugars 3 times a day, before meals. Call [facility] if: BG persistently < 70  I reviewed the Rule of 15 for the treatment of hypoglycemia in  detail with the patient. Literature supplied.    2) Diabetic complications:  Eye: Does not have known diabetic retinopathy.  Neuro/ Feet: Does  have known diabetic peripheral neuropathy .  Renal: Patient does  have known baseline CKD. She   is  on an  ACEI/ARB at present.    3) Peripheral neuropathy:  -Patient is symptomatic -I did encourage the patient to improve glycemic control as this may improve her symptoms -We discussed gabapentin, but I would like to avoid this in CKD and lower extremity edema -I did offer topical gabapentin, but she would like to try OTC creams first   F/U in 2 months       Signed electronically by: Lyndle Herrlich, MD  [facility] Endocrinology  [facility] [address] [place] [place], [place] [address] Phone: [phone] FAX: [phone]   CC: Olive Bass, FNP [address] [place] [address] Phone: [phone]  Fax: [phone]  Return to Endocrinology clinic as below: Future Appointments  Date Time Provider Department Center  02/05/2023  2:00 PM , Konrad Dolores, MD [facility] None

## 2023-02-05 NOTE — Telephone Encounter (Signed)
[id number] to reschedule appointment.

## 2023-02-09 ENCOUNTER — Telehealth: Payer: Self-pay | Admitting: Pharmacist

## 2023-02-09 ENCOUNTER — Encounter: Payer: Self-pay | Admitting: Pharmacist

## 2023-02-09 NOTE — Telephone Encounter (Signed)
Patient referred to Clinical Pharmacist Practitioner for diabetes and medication management by PCP.  Initial appointment was 12/09/2022 for a phone appointment but was unable to reach patient. Scheduler has attempted to rescheduled but has not been able to reach patient. See notes below.  Noted that patient has missed / rescheduled several appointments with Endocrinology office as well.   Attempted outreach today - no answer on patients phone and unable to LM  Tried to call her emergency contact - daughter Gwen Her. No answer / "call could not be completed at this time. Unable to LM on VM.   Will try sending MyChart message to patient.     Penne Lash H, [place]  to Me     01/27/23 10:10 AM 3rd unsuccessful outreach Dec 11, 2022 Me  to Alethia Berthold, [place]  TE    12/11/22  8:13 AM Please try to contact patient to reschedule. Dec 09, 2022 Me   TE   12/09/22  3:43 PM Note Unsuccessful phone visit - patient was scheduled with Clinical Pharmacist Practitioner to review medications and Diabetes.  No answer - LM on VM with CB# [phone] or [phone]

## 2023-02-09 NOTE — Telephone Encounter (Signed)
Scheduler outreach x 3 and forward message back to me. I again tried to outreach patient today . Created new phone message

## 2023-03-05 NOTE — Telephone Encounter (Signed)
2nd attempt by Clinical Pharmacist Practitioner to reach patient. No answer but I was able to LM on VM. CB# [phone] or [phone]

## 2023-03-11 ENCOUNTER — Ambulatory Visit: Payer: Self-pay | Admitting: Internal Medicine

## 2023-03-18 DIAGNOSIS — E113513 Type 2 diabetes mellitus with proliferative diabetic retinopathy with macular edema, bilateral: Secondary | ICD-10-CM | POA: Diagnosis not present

## 2023-03-18 DIAGNOSIS — H3582 Retinal ischemia: Secondary | ICD-10-CM | POA: Diagnosis not present

## 2023-03-18 DIAGNOSIS — H35033 Hypertensive retinopathy, bilateral: Secondary | ICD-10-CM | POA: Diagnosis not present

## 2023-03-23 NOTE — Telephone Encounter (Signed)
3rd attempt to outreach patient to discuss medications and diabetes. Unable to reach patient but LM on VM with CB# [phone] or [phone]

## 2023-05-06 DIAGNOSIS — E113513 Type 2 diabetes mellitus with proliferative diabetic retinopathy with macular edema, bilateral: Secondary | ICD-10-CM | POA: Diagnosis not present

## 2023-06-22 ENCOUNTER — Telehealth: Payer: Self-pay

## 2023-06-22 ENCOUNTER — Other Ambulatory Visit (HOSPITAL_COMMUNITY): Payer: Self-pay

## 2023-06-22 NOTE — Telephone Encounter (Signed)
Pharmacy Patient Advocate Encounter   Received notification from CoverMyMeds that prior authorization for Dexcom G7 sensor is required/requested.   Insurance verification completed.   The patient is insured through [facility] .   Per PA form:    Will need an appointment before we can renew.

## 2023-07-15 ENCOUNTER — Encounter: Payer: Self-pay | Admitting: Internal Medicine

## 2023-07-16 ENCOUNTER — Other Ambulatory Visit: Payer: Self-pay

## 2023-07-16 ENCOUNTER — Telehealth: Payer: Self-pay | Admitting: Internal Medicine

## 2023-07-16 MED ORDER — DEXCOM G7 SENSOR MISC: 1.0000 | 3 refills | Status: DC

## 2023-07-16 MED ORDER — TOUJEO MAX SOLOSTAR 300 UNIT/ML ~~LOC~~ SOPN: 50.0000 [IU] | PEN_INJECTOR | Freq: Every day | SUBCUTANEOUS | 0 refills | Status: DC

## 2023-07-16 NOTE — Telephone Encounter (Signed)
MEDICATION:  1)   Toujeo Max SoloStar insulin glargine, 2 Unit Dial, (TOUJEO MAX SOLOSTAR) 300 UNIT/ML Solostar Pen  2) Dexcom G7 Sensor Continuous Glucose Sensor (DEXCOM G7 SENSOR) MISC PHARMACY:  [id number] - [place], [place] - [address] (Ph: [phone])     HAS THE PATIENT CONTACTED THEIR PHARMACY?  Yes IS THIS A 90 DAY SUPPLY : Yes  IS PATIENT OUT OF MEDICATION: Yes  IF NOT; HOW MUCH IS LEFT:   LAST APPOINTMENT DATE: @5 /16/2024  NEXT APPOINTMENT DATE:@3 /10/2023  DO WE HAVE YOUR PERMISSION TO LEAVE A DETAILED MESSAGE?:Yes  OTHER COMMENTS:    **Let patient know to contact pharmacy at the end of the day to make sure medication is ready. **  ** Please notify patient to allow 48-72 hours to process**  **Encourage patient to contact the pharmacy for refills or they can request refills through [facility]**

## 2023-07-16 NOTE — Telephone Encounter (Signed)
Done

## 2023-08-09 ENCOUNTER — Other Ambulatory Visit: Payer: Self-pay

## 2023-08-09 ENCOUNTER — Emergency Department (HOSPITAL_BASED_OUTPATIENT_CLINIC_OR_DEPARTMENT_OTHER): Payer: Medicaid Other

## 2023-08-09 ENCOUNTER — Emergency Department (HOSPITAL_BASED_OUTPATIENT_CLINIC_OR_DEPARTMENT_OTHER)
Admission: EM | Admit: 2023-08-09 | Discharge: 2023-08-09 | Disposition: A | Discharge status: Home / Self Care | Payer: Medicaid Other | Attending: Emergency Medicine | Admitting: Emergency Medicine

## 2023-08-09 ENCOUNTER — Other Ambulatory Visit (HOSPITAL_BASED_OUTPATIENT_CLINIC_OR_DEPARTMENT_OTHER): Payer: Self-pay

## 2023-08-09 ENCOUNTER — Encounter (HOSPITAL_BASED_OUTPATIENT_CLINIC_OR_DEPARTMENT_OTHER): Payer: Self-pay

## 2023-08-09 DIAGNOSIS — R0602 Shortness of breath: Secondary | ICD-10-CM

## 2023-08-09 DIAGNOSIS — I110 Hypertensive heart disease with heart failure: Secondary | ICD-10-CM | POA: Diagnosis not present

## 2023-08-09 DIAGNOSIS — Z79899 Other long term (current) drug therapy: Secondary | ICD-10-CM | POA: Diagnosis not present

## 2023-08-09 DIAGNOSIS — I5021 Acute systolic (congestive) heart failure: Secondary | ICD-10-CM | POA: Diagnosis not present

## 2023-08-09 DIAGNOSIS — N189 Chronic kidney disease, unspecified: Secondary | ICD-10-CM | POA: Insufficient documentation

## 2023-08-09 DIAGNOSIS — I130 Hypertensive heart and chronic kidney disease with heart failure and stage 1 through stage 4 chronic kidney disease, or unspecified chronic kidney disease: Secondary | ICD-10-CM | POA: Insufficient documentation

## 2023-08-09 DIAGNOSIS — Z91040 Latex allergy status: Secondary | ICD-10-CM | POA: Insufficient documentation

## 2023-08-09 DIAGNOSIS — I509 Heart failure, unspecified: Secondary | ICD-10-CM | POA: Insufficient documentation

## 2023-08-09 LAB — CBC
HCT: 27 % — ABNORMAL LOW (ref 36.0–46.0)
Hemoglobin: 8.2 g/dL — ABNORMAL LOW (ref 12.0–15.0)
MCH: 21.4 pg — ABNORMAL LOW (ref 26.0–34.0)
MCHC: 30.4 g/dL (ref 30.0–36.0)
MCV: 70.5 fL — ABNORMAL LOW (ref 80.0–100.0)
Platelets: 206 10*3/uL (ref 150–400)
RBC: 3.83 MIL/uL — ABNORMAL LOW (ref 3.87–5.11)
RDW: 20.5 % — ABNORMAL HIGH (ref 11.5–15.5)
WBC: 6.4 10*3/uL (ref 4.0–10.5)
nRBC: 0 % (ref 0.0–0.2)

## 2023-08-09 LAB — BASIC METABOLIC PANEL
Anion gap: 9 (ref 5–15)
BUN: 49 mg/dL — ABNORMAL HIGH (ref 8–23)
CO2: 20 mmol/L — ABNORMAL LOW (ref 22–32)
Calcium: 8.7 mg/dL — ABNORMAL LOW (ref 8.9–10.3)
Chloride: 108 mmol/L (ref 98–111)
Creatinine, Ser: 3.54 mg/dL — ABNORMAL HIGH (ref 0.44–1.00)
GFR, Estimated: 14 mL/min — ABNORMAL LOW (ref >60)
Glucose, Bld: 180 mg/dL — ABNORMAL HIGH (ref 70–99)
Potassium: 4.8 mmol/L (ref 3.5–5.1)
Sodium: 137 mmol/L (ref 135–145)

## 2023-08-09 LAB — TROPONIN I (HIGH SENSITIVITY): Troponin I (High Sensitivity): 23 ng/L — ABNORMAL HIGH (ref <18)

## 2023-08-09 LAB — BRAIN NATRIURETIC PEPTIDE: B Natriuretic Peptide: 1394.1 pg/mL — ABNORMAL HIGH (ref 0.0–100.0)

## 2023-08-09 MED ORDER — FUROSEMIDE 10 MG/ML IJ SOLN
60.0000 mg | Freq: Once | INTRAMUSCULAR | Status: AC
  Administered 2023-08-09: 60 mg via INTRAVENOUS
  Filled 2023-08-09: qty 6

## 2023-08-09 MED ORDER — FUROSEMIDE 20 MG PO TABS
20.0000 mg | ORAL_TABLET | Freq: Two times a day (BID) | ORAL | 2 refills | Status: DC
  Filled 2023-08-09 (×2): qty 90, 45d supply, fill #0
  Filled 2023-09-03 – 2023-09-13 (×3): qty 90, 45d supply, fill #1

## 2023-08-09 MED ORDER — LANTUS SOLOSTAR 100 UNIT/ML ~~LOC~~ SOPN: 50.0000 [IU] | PEN_INJECTOR | Freq: Every day | SUBCUTANEOUS | 1 refills | Status: DC

## 2023-08-09 MED ORDER — TOUJEO MAX SOLOSTAR 300 UNIT/ML ~~LOC~~ SOPN: 50.0000 [IU] | PEN_INJECTOR | Freq: Every day | SUBCUTANEOUS | 0 refills | Status: DC

## 2023-08-09 MED ORDER — LANTUS SOLOSTAR 100 UNIT/ML ~~LOC~~ SOPN
50.0000 [IU] | PEN_INJECTOR | Freq: Every day | SUBCUTANEOUS | 1 refills | Status: DC
  Filled 2023-08-09: qty 15, 30d supply, fill #0
  Filled 2023-11-06: qty 15, 30d supply, fill #1

## 2023-08-09 MED ORDER — DAPAGLIFLOZIN PROPANEDIOL 10 MG PO TABS
10.0000 mg | ORAL_TABLET | Freq: Every day | ORAL | 2 refills | Status: DC
  Filled 2023-08-09: qty 90, 90d supply, fill #0

## 2023-08-09 MED ORDER — NOVOLOG FLEXPEN 100 UNIT/ML ~~LOC~~ SOPN
45.0000 [IU] | PEN_INJECTOR | Freq: Every day | SUBCUTANEOUS | 3 refills | Status: DC
  Filled 2023-08-09: qty 45, 90d supply, fill #0
  Filled 2023-08-10: qty 39, 87d supply, fill #0

## 2023-08-09 MED ORDER — FUROSEMIDE 10 MG/ML IJ SOLN: 40.0000 mg | Freq: Once | INTRAMUSCULAR | Status: DC

## 2023-08-09 NOTE — Discharge Instructions (Addendum)
 I prescribed a refill of your Lasix  as well as your insulin , please call us  if there is something wrong with either of these prescriptions, and you were unable to pick them up from our pharmacy.  I want you to follow-up with your primary care doctor closely within around 1 week for recheck of your labs to ensure that your heart failure is improving after return of your home medication.  Please return if you have worsening shortness of breath, chest pain prior to being able to follow-up with your primary care doctor.

## 2023-08-09 NOTE — ED Triage Notes (Signed)
 States hasn't been able to pick up furosemide since October. Increase in swelling in legs, SOB. Denies chest pain.

## 2023-08-09 NOTE — ED Provider Notes (Signed)
 [facility] Provider Note   CSN: [id number] Arrival date & time: 08/09/23  9061     History  Chief Complaint  Patient presents with   Shortness of Breath    Lori Mclaughlin is a 63 y.o. female past medical history seen for hypertension, anemia, hyperlipidemia, CKD, CHF who presents with concern for shortness of breath, increased swelling in legs.  Reports that she has not been able to pick up her diuretic since October.  She reports that the shortness of breath is worse with exertion.  She reports some occasional chest tightness with exertion.  She denies any chest pain at rest or at time my evaluation.  She denies any cough, fever, chills.   Shortness of Breath      Home Medications Prior to Admission medications   Medication Sig Start Date End Date Taking? Authorizing Provider  furosemide  (LASIX ) 20 MG tablet Take 1 tablet (20 mg total) by mouth 2 (two) times daily. For the first 5 days please take 40 mg in the morning and 20 mg at night, then you can return to your normal twice daily dosing 08/09/23  Yes Shann Lewellyn H, PA-C  allopurinol  (ZYLOPRIM ) 300 MG tablet TAKE 1 TABLET BY MOUTH DAILY 07/17/22   Jason Leita Repine, FNP  amLODipine  (NORVASC ) 10 MG tablet Take 1 tablet (10 mg total) by mouth daily. 06/04/22   Raford Riggs, MD  atorvastatin  (LIPITOR) 20 MG tablet TAKE 1 TABLET BY MOUTH AT  BEDTIME NEED APPOINTMENT 07/28/22   Shlomo Wilbert SAUNDERS, MD  blood glucose meter kit and supplies Dispense based on patient and insurance preference. Use up to four times daily as directed. (FOR ICD-10 E10.9, E11.9). 03/24/22   Jason Leita Repine, FNP  Calcium  Carbonate Antacid (MAALOX) 600 MG chewable tablet Chew 1 tablet (600 mg total) by mouth every 8 (eight) hours as needed for heartburn. 05/27/22   Hildegard Loge, PA-C  carvedilol  (COREG ) 6.25 MG tablet TAKE 1 TABLET BY MOUTH TWICE  DAILY WITH MEALS 07/17/22   Raford Riggs,  MD  Continuous Glucose Sensor (DEXCOM G7 SENSOR) MISC 1 Device by Does not apply route as directed. 07/16/23   Shamleffer, Ibtehal Jaralla, MD  dapagliflozin  propanediol (FARXIGA ) 10 MG TABS tablet Take 1 tablet (10 mg total) by mouth daily. 12/10/22   Shamleffer, Ibtehal Jaralla, MD  dicyclomine  (BENTYL ) 20 MG tablet Take 1 tablet (20 mg total) by mouth 2 (two) times daily. 05/27/22   Hildegard, Amjad, PA-C  fluticasone  (FLONASE ) 50 MCG/ACT nasal spray Place 2 sprays into both nostrils daily. Patient taking differently: Place 2 sprays into both nostrils daily as needed. 06/13/21   Jason Leita Repine, FNP  hydrALAZINE  (APRESOLINE ) 100 MG tablet TAKE 1 TABLET BY MOUTH 3 TIMES  DAILY 05/04/22   Shlomo Wilbert SAUNDERS, MD  insulin  aspart (NOVOLOG  FLEXPEN) 100 UNIT/ML FlexPen Max daily 45 units daily 08/09/23   Braxen Dobek H, PA-C  insulin  glargine (LANTUS  SOLOSTAR) 100 UNIT/ML Solostar Pen Inject 50 Units into the skin daily. In the afternoon 08/09/23   Olden Klauer H, PA-C  Insulin  Pen Needle 32G X 4 MM MISC 1 Device by Does not apply route in the morning, at noon, in the evening, and at bedtime. 12/10/22   Shamleffer, Ibtehal Jaralla, MD  losartan  (COZAAR ) 50 MG tablet Take 1 tablet (50 mg total) by mouth daily. 06/04/22   Raford Riggs, MD  ondansetron  (ZOFRAN -ODT) 4 MG disintegrating tablet Take 1 tablet (4 mg total) by mouth every  8 (eight) hours as needed. 05/27/22   Hildegard, Amjad, PA-C  pantoprazole  (PROTONIX ) 40 MG tablet Take 1 tablet (40 mg total) by mouth daily. 05/27/22   Ali, Amjad, PA-C  Semaglutide ,0.25 or 0.5MG /DOS, 2 MG/3ML SOPN Inject 0.5 mg into the skin once a week. 12/10/22   Shamleffer, Ibtehal Jaralla, MD  Vitamin D, Ergocalciferol, (DRISDOL) 1.25 MG (50000 UNIT) CAPS capsule Take 50,000 Units by mouth once a week. 04/27/22   [provider]      Allergies    Lisinopril and Latex    Review of Systems   Review of Systems  Respiratory:  Positive for shortness of breath.    All other systems reviewed and are negative.   Physical Exam Updated Vital Signs BP (!) 186/86 (BP Location: Left Wrist)   Pulse (!) 56   Temp 98.2 F (36.8 C) (Oral)   Resp (!) 22   Ht 5' (1.524 m)   LMP 02/23/2012   SpO2 98%   BMI 49.22 kg/m  Physical Exam Vitals and nursing note reviewed.  Constitutional:      General: She is not in acute distress.    Appearance: Normal appearance.  HENT:     Head: Normocephalic and atraumatic.  Eyes:     General:        Right eye: No discharge.        Left eye: No discharge.  Cardiovascular:     Rate and Rhythm: Normal rate and regular rhythm.     Heart sounds: No murmur heard.    No friction rub. No gallop.  Pulmonary:     Effort: Pulmonary effort is normal.     Breath sounds: Normal breath sounds.     Comments: Mild crackles at lung bases, no wheezing, rhonchi, stridor, rales, some tachypnea, increased work of breathing noted Abdominal:     General: Bowel sounds are normal.     Palpations: Abdomen is soft.  Musculoskeletal:     Comments: 1-2+ pitting edema bilateral lower extremities.  Skin:    General: Skin is warm and dry.     Capillary Refill: Capillary refill takes less than 2 seconds.  Neurological:     Mental Status: She is alert and oriented to person, place, and time.  Psychiatric:        Mood and Affect: Mood normal.        Behavior: Behavior normal.     ED Results / Procedures / Treatments   Labs (all labs ordered are listed, but only abnormal results are displayed) Labs Reviewed  CBC - Abnormal; Notable for the following components:      Result Value   RBC 3.83 (*)    Hemoglobin 8.2 (*)    HCT 27.0 (*)    MCV 70.5 (*)    MCH 21.4 (*)    RDW 20.5 (*)    All other components within normal limits  BASIC METABOLIC PANEL - Abnormal; Notable for the following components:   CO2 20 (*)    Glucose, Bld 180 (*)    BUN 49 (*)    Creatinine, Ser 3.54 (*)    Calcium  8.7 (*)    GFR, Estimated 14 (*)    All  other components within normal limits  BRAIN NATRIURETIC PEPTIDE - Abnormal; Notable for the following components:   B Natriuretic Peptide 1,394.1 (*)    All other components within normal limits  TROPONIN I (HIGH SENSITIVITY) - Abnormal; Notable for the following components:   Troponin I (High Sensitivity) 23 (*)  All other components within normal limits    EKG EKG Interpretation Date/Time:  Monday August 09 2023 09:55:10 EST Ventricular Rate:  54 PR Interval:  174 QRS Duration:  142 QT Interval:  538 QTC Calculation: 515 R Axis:   -17  Text Interpretation: Sinus rhythm Right bundle branch block Probable anterolateral infarct, old Confirmed by Ruthe Cornet [phone]) on 08/09/2023 11:31:58 AM  Radiology DG Chest 2 View Result Date: 08/09/2023 CLINICAL DATA:  Shortness of breath. EXAM: CHEST - 2 VIEW COMPARISON:  Chest radiograph dated 05/27/2022. FINDINGS: There is mild cardiomegaly with mild vascular congestion. Small right pleural effusion. No pneumothorax. Degenerative changes of the spine. No acute osseous pathology. IMPRESSION: Mild cardiomegaly with mild vascular congestion and small right pleural effusion. Electronically Signed   By: Vanetta Chou M.D.   On: 08/09/2023 11:41    Procedures Procedures    Medications Ordered in ED Medications  furosemide  (LASIX ) injection 60 mg (60 mg Intravenous Given 08/09/23 1217)    ED Course/ Medical Decision Making/ A&P                                 Medical Decision Making Amount and/or Complexity of Data Reviewed Labs: ordered. Radiology: ordered.  Risk Prescription drug management.   This patient is a 63 y.o. female  who presents to the ED for concern of shob.   Differential diagnoses prior to evaluation: The emergent differential diagnosis includes, but is not limited to,  asthma exacerbation, COPD exacerbation, acute upper respiratory infection, acute bronchitis, chronic bronchitis, interstitial lung disease,  ARDS, PE, pneumonia, atypical ACS, carbon monoxide poisoning, spontaneous pneumothorax, new CHF vs CHF exacerbation, versus other . This is not an exhaustive differential.   Past Medical History / Co-morbidities / Social History: hypertension, anemia, hyperlipidemia, CKD, CHF  Additional history: Chart reviewed. Pertinent results include: Reviewed echo from 2022 which showed 60 to 65% left ventricular ejection fraction  Physical Exam: Physical exam performed. The pertinent findings include: Patient does have some peripheral edema noted, she is quite hypertensive on arrival, blood pressure 186/86.  She is mildly tachypneic, respirations 22 with some crackles noted at lung bases.  No focal consolidation noted.  No acute respiratory distress.  Lab Tests/Imaging studies: I personally interpreted labs/imaging and the pertinent results include: BMP notable for slight worsening of her chronic kidney disease, BUN 49, creatinine 3.54, suspect secondary to her poorly controlled heart failure at this time, will administer her Lasix , and encourage close recheck, patient agrees this plan.  She has worsening of her chronic anemia as well, hemoglobin 8.2.  She denies any melena or acute blood loss, suspect likely anemia of chronic disease.  Her troponin is very mildly elevated at 23 which I suspect is chronic as it has been elevated previously. Plain film chest xray shows vascular congestion, small left pleural effusion consistent with heart failure exacerbation.  I agree with the radiologist interpretation.  Cardiac monitoring: EKG obtained and interpreted by myself and attending physician which shows: Normal sinus rhythm, no acute ST-T changes.   Medications: I ordered medication including ordered 1 dose of IV Lasix , and will discharge patient with her home Lasix  medication refill as well as she is requesting insulin  refill after being told that her [facility] does not have it.  Encourage close PCP follow-up in  around 1 week..  I have reviewed the patients home medicines and have made adjustments as needed.   Disposition: After consideration  of the diagnostic results and the patients response to treatment, I feel that discussed with patient around admission for overnight observation versus discharge home, considering she is not hypoxic, no acute respiratory distress I do think is reasonable discharge patient home and she agrees with this plan.  Extensive return precautions given for worsening shortness of breath..   emergency department workup does not suggest an emergent condition requiring admission or immediate intervention beyond what has been performed at this time. The plan is: as above. The patient is safe for discharge and has been instructed to return immediately for worsening symptoms, change in symptoms or any other concerns.  Final Clinical Impression(s) / ED Diagnoses Final diagnoses:  Acute congestive heart failure, unspecified heart failure type (HCC)  Shortness of breath    Rx / DC Orders ED Discharge Orders          Ordered    furosemide  (LASIX ) 20 MG tablet  2 times daily        08/09/23 1206    insulin  aspart (NOVOLOG  FLEXPEN) 100 UNIT/ML FlexPen        08/09/23 1206    insulin  glargine (LANTUS  SOLOSTAR) 100 UNIT/ML Solostar Pen  Daily,   Status:  Discontinued        08/09/23 1206    insulin  glargine (LANTUS  SOLOSTAR) 100 UNIT/ML Solostar Pen  Daily        08/09/23 1207              Rosan Sherlean DEL, PA-C 08/09/23 1224    Ruthe Cornet, DO 08/09/23 1304

## 2023-08-10 ENCOUNTER — Other Ambulatory Visit (HOSPITAL_BASED_OUTPATIENT_CLINIC_OR_DEPARTMENT_OTHER): Payer: Self-pay

## 2023-08-11 ENCOUNTER — Other Ambulatory Visit (HOSPITAL_BASED_OUTPATIENT_CLINIC_OR_DEPARTMENT_OTHER): Payer: Self-pay

## 2023-09-02 DIAGNOSIS — H35033 Hypertensive retinopathy, bilateral: Secondary | ICD-10-CM | POA: Diagnosis not present

## 2023-09-02 DIAGNOSIS — H3582 Retinal ischemia: Secondary | ICD-10-CM | POA: Diagnosis not present

## 2023-09-02 DIAGNOSIS — E113513 Type 2 diabetes mellitus with proliferative diabetic retinopathy with macular edema, bilateral: Secondary | ICD-10-CM | POA: Diagnosis not present

## 2023-09-03 ENCOUNTER — Other Ambulatory Visit (HOSPITAL_BASED_OUTPATIENT_CLINIC_OR_DEPARTMENT_OTHER): Payer: Self-pay | Admitting: Cardiovascular Disease

## 2023-09-04 ENCOUNTER — Other Ambulatory Visit (HOSPITAL_BASED_OUTPATIENT_CLINIC_OR_DEPARTMENT_OTHER): Payer: Self-pay

## 2023-09-06 ENCOUNTER — Other Ambulatory Visit (HOSPITAL_BASED_OUTPATIENT_CLINIC_OR_DEPARTMENT_OTHER): Payer: Self-pay

## 2023-09-06 ENCOUNTER — Ambulatory Visit: Payer: Self-pay | Admitting: Family

## 2023-09-06 MED ORDER — ATORVASTATIN CALCIUM 20 MG PO TABS
20.0000 mg | ORAL_TABLET | Freq: Every day | ORAL | 0 refills | Status: DC
  Filled 2023-09-06: qty 30, 30d supply, fill #0

## 2023-09-06 NOTE — Telephone Encounter (Signed)
 Copied from CRM [id number]. Topic: Clinical - Red Word Triage >> Sep 06, 2023  1:29 PM Barney Boozer wrote: Red Word that prompted transfer to Nurse Triage: Shortness Of Breath >> Sep 06, 2023  1:31 PM Barney Boozer wrote: Patient states she went to the hospital January 13th, she was suppose to schedule her 2 week follow up appointment but she didn't because she had started feeling better. Now patient is experiencing shortness of breath and fluid retention.    Chief Complaint: SOB, BLE edema Symptoms: SOB mild at present and severe at night, wheezing, some weakness with exertion "just tired," pitting edema BLE Frequency: continual Pertinent Negatives: Patient denies chest pain, dizziness, current severe SOB Disposition: [] 911 / [] ED /[] Urgent Care (no appt availability in office) / [] Appointment(In office/virtual)/ []  [facility]/ [] Home Care/ [x] Refused Recommended Disposition /[]  Mobile Bus/ []  Follow-up with PCP Additional Notes: Pt reporting currently "mild" SOB with "wheezing a little bit, more so at night" as SOB is "moderate to severe at night," and "tiredness," . Pt reporting hx of CHF, significant cardiac hx, more exacerbation started in October "Was waiting for [facility] pharmacy to fill scripts for me for fluid and still don't have any in stock, went about 2 months without fluid meds," then it "got unbearable in January" so went to ED, was supposed to do 2-week follow up with PCP after ED but "was feeling better" so did not. Pt confirms has fluid meds and been taking again since 1/13, now SOB is more "come and go" rather than constant. Pt reporting fluid retention in "feet and legs, left side more so than right side, up to right below knee," pitting edema, indent stays "for couple minutes." Pt confirms no chest pain, no severe SOB at present, pt speaking in full sentences, confirms longstanding difficulty breathing worse than normal. Advised pt be examined in next 4 hours, pt  refusing, does not drive and daughter not home from work until later, advised UC after work if not able to be seen earlier. Pt verbalized understanding, requesting appt tomorrow. Connected to CAL, CAL requesting nurse schedule appt for when pt wants to be seen, per office okay to schedule. Confirmed with management. Called pt back and scheduled for tomorrow with PCP, confirmed location/appt info with pt. Advised call back if worsening or ED if severe, pt verbalized understanding.  Reason for Disposition  [1] Longstanding difficulty breathing (e.g., CHF, COPD, emphysema) AND [2] WORSE than normal  Answer Assessment - Initial Assessment Questions 1. RESPIRATORY STATUS: "Describe your breathing?" (e.g., wheezing, shortness of breath, unable to speak, severe coughing)      Wheezing a little bit more so at night, a little tight in the morning, shortness of breath, no coughing, chest congestion a little bit not really just tiredness 2. ONSET: "When did this breathing problem begin?"      Was waiting for [facility] to fill scripts for me for fluid and still don't have any in stock, went about 2 months without fluid meds, went to ED then supposed to do 2 week follow up, started in October, got unbearable in January, found pharmacy with meds, taking meds since Jan 13th 3. PATTERN "Does the difficult breathing come and go, or has it been constant since it started?"      Since started taking med a week after a come and go, but before then it was constant 4. SEVERITY: "How bad is your breathing?" (e.g., mild, moderate, severe)    - MILD: No SOB  at rest, mild SOB with walking, speaks normally in sentences, can lie down, no retractions, pulse < 100.    - MODERATE: SOB at rest, SOB with minimal exertion and prefers to sit, cannot lie down flat, speaks in phrases, mild retractions, audible wheezing, pulse 100-120.    - SEVERE: Very SOB at rest, speaks in single words, struggling to breathe, sitting hunched  forward, retractions, pulse > 120      Right now mild, at night moderate to severe 5. RECURRENT SYMPTOM: "Have you had difficulty breathing before?" If Yes, ask: "When was the last time?" and "What happened that time?"     Been some years before October 6. CARDIAC HISTORY: "Do you have any history of heart disease?" (e.g., heart attack, angina, bypass surgery, angioplasty)      CHF, significant cardiac hx 7. LUNG HISTORY: "Do you have any history of lung disease?"  (e.g., pulmonary embolus, asthma, emphysema)     denies 9. OTHER SYMPTOMS: "Do you have any other symptoms? (e.g., dizziness, runny nose, cough, chest pain, fever)     No, fluid retention in the feet and legs, left side more so than right side, up to right below knee, pitting edema, indent stays for couple minutes  Protocols used: Breathing Difficulty-A-AH

## 2023-09-07 ENCOUNTER — Other Ambulatory Visit: Payer: Self-pay | Admitting: Family

## 2023-09-07 ENCOUNTER — Ambulatory Visit (INDEPENDENT_AMBULATORY_CARE_PROVIDER_SITE_OTHER): Payer: Medicaid Other | Admitting: Family

## 2023-09-07 ENCOUNTER — Telehealth: Payer: Self-pay | Admitting: Family

## 2023-09-07 ENCOUNTER — Other Ambulatory Visit (HOSPITAL_BASED_OUTPATIENT_CLINIC_OR_DEPARTMENT_OTHER): Payer: Self-pay

## 2023-09-07 ENCOUNTER — Encounter: Payer: Self-pay | Admitting: Family

## 2023-09-07 VITALS — BP 146/88 | HR 55 | Ht 60.0 in | Wt 274.0 lb

## 2023-09-07 DIAGNOSIS — R0609 Other forms of dyspnea: Secondary | ICD-10-CM | POA: Diagnosis not present

## 2023-09-07 DIAGNOSIS — I509 Heart failure, unspecified: Secondary | ICD-10-CM | POA: Diagnosis not present

## 2023-09-07 DIAGNOSIS — R600 Localized edema: Secondary | ICD-10-CM

## 2023-09-07 DIAGNOSIS — N1832 Chronic kidney disease, stage 3b: Secondary | ICD-10-CM | POA: Diagnosis not present

## 2023-09-07 NOTE — Patient Instructions (Signed)
You need to schedule a follow up with your cardiologist and kidney doctor; I will update that referral for you.

## 2023-09-07 NOTE — Progress Notes (Signed)
Lori Mclaughlin is a 63 y.o. female with the following history as recorded in Epic[facility]:  Patient Active Problem List   Diagnosis Date Noted   Liver lesion 07/29/2022   Elevated LFTs 07/29/2022   Dilation of biliary tract 07/29/2022   Right upper quadrant pain 06/04/2022   Diabetes (HCC) 05/10/2021   Hypertensive emergency 03/08/2021   Chronic kidney disease (CKD) stage G3b/A1, moderately decreased glomerular filtration rate (GFR) between 30-44 mL/min/1.73 square meter and albuminuria creatinine ratio less than 30 mg/g (HCC) 03/08/2021   Chronic diastolic heart failure (HCC) 03/07/2021   Thiamine deficiency 09/19/2017   Myalgia 03/10/2017   Screen for colon cancer 06/12/2015   Visit for screening mammogram 06/12/2015   Hypersomnia 04/11/2014   Morbid obesity (HCC) 04/11/2014   Bradycardia, drug induced 04/11/2014   Chronic diastolic CHF (congestive heart failure), NYHA class 2 (HCC) 03/30/2014   Low back pain radiating to left leg 01/26/2012   Sleep apnea 11/13/2011   Hyperlipidemia with target LDL less than 70 01/14/2011   Deficiency anemia 08/05/2009   Essential hypertension, malignant 08/05/2009   ALLERGIC RHINITIS 08/05/2009   GERD 08/05/2009   OSTEOARTHRITIS 08/05/2009    Current Outpatient Medications  Medication Sig Dispense Refill   amLODipine (NORVASC) 10 MG tablet Take 1 tablet (10 mg total) by mouth daily. 90 tablet 3   atorvastatin (LIPITOR) 20 MG tablet Take 1 tablet (20 mg total) by mouth daily. *Need appointment for further refills, call [phone].* 30 tablet 0   blood glucose meter kit and supplies Dispense based on patient and insurance preference. Use up to four times daily as directed. (FOR ICD-10 E10.9, E11.9). 1 each 0   Calcium Carbonate Antacid (MAALOX) 600 MG chewable tablet Chew 1 tablet (600 mg total) by mouth every 8 (eight) hours as needed for heartburn. 90 tablet 0   carvedilol (COREG) 6.25 MG tablet TAKE 1 TABLET BY MOUTH TWICE  DAILY WITH  MEALS 180 tablet 3   Continuous Glucose Sensor (DEXCOM G7 SENSOR) MISC 1 Device by Does not apply route as directed. 9 each 3   dapagliflozin propanediol (FARXIGA) 10 MG TABS tablet Take 1 tablet (10 mg total) by mouth daily. 90 tablet 2   fluticasone (FLONASE) 50 MCG/ACT nasal spray Place 2 sprays into both nostrils daily. (Patient taking differently: Place 2 sprays into both nostrils daily as needed.) 16 g 6   furosemide (LASIX) 20 MG tablet Take 1 tablet (20 mg total) by mouth 2 (two) times daily. For the first 5 days please take 40 mg in the morning and 20 mg at night, then you can return to your normal twice daily dosing 90 tablet 2   hydrALAZINE (APRESOLINE) 100 MG tablet TAKE 1 TABLET BY MOUTH 3 TIMES  DAILY 270 tablet 1   insulin aspart (NOVOLOG FLEXPEN) 100 UNIT/ML FlexPen Inject 45 Units into the skin daily. 45 mL 3   insulin glargine (LANTUS SOLOSTAR) 100 UNIT/ML Solostar Pen Inject 50 Units into the skin daily. In the afternoon 15 mL 1   Insulin Pen Needle 32G X 4 MM MISC 1 Device by Does not apply route in the morning, at noon, in the evening, and at bedtime. 400 each 2   losartan (COZAAR) 50 MG tablet Take 1 tablet (50 mg total) by mouth daily. 90 tablet 3   ondansetron (ZOFRAN-ODT) 4 MG disintegrating tablet Take 1 tablet (4 mg total) by mouth every 8 (eight) hours as needed. 20 tablet 0   allopurinol (ZYLOPRIM) 300 MG tablet TAKE 1  TABLET BY MOUTH DAILY (Patient not taking: Reported on 09/07/2023) 90 tablet 3   dicyclomine (BENTYL) 20 MG tablet Take 1 tablet (20 mg total) by mouth 2 (two) times daily. (Patient not taking: Reported on 09/07/2023) 20 tablet 0   pantoprazole (PROTONIX) 40 MG tablet Take 1 tablet (40 mg total) by mouth daily. (Patient not taking: Reported on 09/07/2023) 30 tablet 0   No current facility-administered medications for this visit.    Allergies: Lisinopril and Latex  Past Medical History:  Diagnosis Date   Allergic rhinitis    Asthma    Atypical chest pain     a. 03/2014: normal nuclear stress test.   Chronic diastolic CHF (congestive heart failure) (HCC)    a. Dx 03/2014 with echo - moderate LVH, EF 55-60%, grade 1 diastolic dysfunction, mildly dilated LA.   Chronic diastolic heart failure (HCC) 03/07/2021   CKD (chronic kidney disease), stage II    Diabetes mellitus type II    GERD (gastroesophageal reflux disease)    Headache(784.0)    when my bp is up   Hypertension    Hypertensive heart disease    Microcytic anemia    OSA (obstructive sleep apnea)    severe with AHI 77/hr with nocturnal hypoxemia   Osteoarthritis    Right upper quadrant pain 06/04/2022    Past Surgical History:  Procedure Laterality Date   SHOULDER ARTHROSCOPY Bilateral    TUBAL LIGATION      Family History  Problem Relation Age of Onset   Heart attack Mother    Hypertension Mother    Cancer Father    Hypertension Brother    Hypertension Maternal Grandmother    Hypertension Maternal Grandfather    Diabetes Other    Hypertension Other    Stroke Other    Cancer Other        colon; prostate   Kidney disease Neg Hx    Alcohol abuse Neg Hx    Asthma Neg Hx    COPD Neg Hx    Depression Neg Hx    Drug abuse Neg Hx    Early death Neg Hx    Hearing loss Neg Hx    Heart disease Neg Hx    Hyperlipidemia Neg Hx     Social History   Tobacco Use   Smoking status: Former    Current packs/day: 0.00    Types: Cigarettes    Quit date: 08/29/2010    Years since quitting: 13.0   Smokeless tobacco: Never  Substance Use Topics   Alcohol use: No    Alcohol/week: 5.0 standard drinks of alcohol    Types: 5 Glasses of wine per week    Subjective:   Accompanied by family members; known history of CHF; has not seen her cardiologist in over a year; known history of CKD-has not seen her nephrologist in over a year; last saw her endocrinologist in May 2024; Notes that has felt SOB with wheezing since October 2024 with "bad wheezing at night." Had been without her Lasix  from October to January; went to ER on 08/09/23 and was started back on her medication; felt better initially but does feel that symptoms have re-flared in the past few days; "I feel like I need something more acute to help with my symptoms today. I think I want to go down to the emergency room." She spoke to a triage nurse yesterday who recommended ER evaluation but patient declined yesterday;     Objective:  Vitals:  09/07/23 1321  BP: (!) 146/88  Pulse: (!) 55  SpO2: 100%  Weight: 274 lb (124.3 kg)  Height: 5' (1.524 m)    General: Well developed, well nourished, in no acute distress/ able to speak in complete sentences Skin : Warm and dry.  Head: Normocephalic and atraumatic  Eyes: Sclera and conjunctiva clear; pupils round and reactive to light; extraocular movements intact  Ears: External normal; canals clear; tympanic membranes normal  Oropharynx: Pink, supple. No suspicious lesions  Neck: Supple without thyromegaly, adenopathy  Lungs: Respirations unlabored; clear to auscultation bilaterally without wheeze, rales, rhonchi  CVS exam: normal rate and regular rhythm.  Extremities: bilateral pedal edema, cyanosis, clubbing  Vessels: Symmetric bilaterally  Neurologic: Alert and oriented; speech intact; face symmetrical; moves all extremities well; CNII-XII intact without focal deficit   Assessment:  1. Congestive heart failure, unspecified HF chronicity, unspecified heart failure type (HCC)   2. Chronic kidney disease (CKD) stage G3b/A1, moderately decreased glomerular filtration rate (GFR) between 30-44 mL/min/1.73 square meter and albuminuria creatinine ratio less than 30 mg/g (HCC)   3. Pedal edema   4. DOE (dyspnea on exertion)     Plan:  Patient's physical exam was actually reassuring- she was not wheezing and was able to speak in complete sentences; Discussed with patient that options for today would be to update CXR and labs and increase her Lasix or she could go to the ER  if she felt she needed emergent treatment. She initially said she wanted to go to the ER; however, by the time, I was able to get back to the room to get discharge plans discussed and formalized, she had decided that she wanted to just go home and call her cardiologist to try and be seen there as quickly as possible.  At time of finishing this note, can see that patient is not scheduled to see her cardiologist until early March; have placed orders for her to to to [facility] lab tomorrow and at least get a CXR and labs done; can consider increasing dosage of Lasix as well; will have my staff call her tomorrow to stress importance of her getting the labs and CXR done quickly; follow up to be determined;   No follow-ups on file.  Orders Placed This Encounter  Procedures   Ambulatory referral to Cardiology    Referral Priority:   Routine    Referral Type:   Consultation    Referral Reason:   Specialty Services Required    Referred to Provider:   Chilton Si, MD    Number of Visits Requested:   1   Ambulatory referral to Nephrology    Referral Priority:   Routine    Referral Type:   Consultation    Referral Reason:   Specialty Services Required    Referred to Provider:   Estanislado Emms, MD    Requested Specialty:   Nephrology    Number of Visits Requested:   1    Requested Prescriptions    No prescriptions requested or ordered in this encounter

## 2023-09-07 NOTE — Telephone Encounter (Signed)
Since she can't get in to see her cardiologist quickly as we had all hoped, she needs to go to [facility] lab today and get both labs and CXR done. The orders are in place for her and she can go at her convenience between 7:30 am-5:30 pm; I think they close from 12-1 for lunch in the X-rays;

## 2023-09-08 NOTE — Telephone Encounter (Signed)
Spoke with pt, pt is aware and expressed understanding, advised pt of lab work and CXR.

## 2023-09-27 ENCOUNTER — Ambulatory Visit (HOSPITAL_BASED_OUTPATIENT_CLINIC_OR_DEPARTMENT_OTHER): Payer: Medicaid Other | Admitting: Cardiovascular Disease

## 2023-09-27 ENCOUNTER — Encounter (HOSPITAL_COMMUNITY): Payer: Self-pay

## 2023-09-27 ENCOUNTER — Inpatient Hospital Stay (HOSPITAL_COMMUNITY)
Admission: EM | Admit: 2023-09-27 | Discharge: 2023-10-05 | DRG: 291 | Disposition: A | Discharge status: Home / Self Care | Source: Ambulatory Visit | Attending: Cardiovascular Disease | Admitting: Cardiovascular Disease

## 2023-09-27 ENCOUNTER — Encounter (HOSPITAL_BASED_OUTPATIENT_CLINIC_OR_DEPARTMENT_OTHER): Payer: Self-pay | Admitting: Cardiovascular Disease

## 2023-09-27 VITALS — BP 184/70 | HR 64 | Ht 60.0 in | Wt 264.0 lb

## 2023-09-27 DIAGNOSIS — Z888 Allergy status to other drugs, medicaments and biological substances status: Secondary | ICD-10-CM

## 2023-09-27 DIAGNOSIS — G4730 Sleep apnea, unspecified: Secondary | ICD-10-CM

## 2023-09-27 DIAGNOSIS — D631 Anemia in chronic kidney disease: Secondary | ICD-10-CM | POA: Diagnosis not present

## 2023-09-27 DIAGNOSIS — M7989 Other specified soft tissue disorders: Secondary | ICD-10-CM | POA: Diagnosis present

## 2023-09-27 DIAGNOSIS — Z8249 Family history of ischemic heart disease and other diseases of the circulatory system: Secondary | ICD-10-CM | POA: Diagnosis not present

## 2023-09-27 DIAGNOSIS — Z833 Family history of diabetes mellitus: Secondary | ICD-10-CM | POA: Diagnosis not present

## 2023-09-27 DIAGNOSIS — T502X5A Adverse effect of carbonic-anhydrase inhibitors, benzothiadiazides and other diuretics, initial encounter: Secondary | ICD-10-CM | POA: Diagnosis present

## 2023-09-27 DIAGNOSIS — Z6841 Body Mass Index (BMI) 40.0 and over, adult: Secondary | ICD-10-CM | POA: Diagnosis not present

## 2023-09-27 DIAGNOSIS — N179 Acute kidney failure, unspecified: Secondary | ICD-10-CM

## 2023-09-27 DIAGNOSIS — R140 Abdominal distension (gaseous): Secondary | ICD-10-CM | POA: Diagnosis not present

## 2023-09-27 DIAGNOSIS — I5021 Acute systolic (congestive) heart failure: Secondary | ICD-10-CM | POA: Diagnosis not present

## 2023-09-27 DIAGNOSIS — E1165 Type 2 diabetes mellitus with hyperglycemia: Secondary | ICD-10-CM | POA: Diagnosis not present

## 2023-09-27 DIAGNOSIS — I130 Hypertensive heart and chronic kidney disease with heart failure and stage 1 through stage 4 chronic kidney disease, or unspecified chronic kidney disease: Principal | ICD-10-CM | POA: Diagnosis present

## 2023-09-27 DIAGNOSIS — N184 Chronic kidney disease, stage 4 (severe): Secondary | ICD-10-CM | POA: Diagnosis not present

## 2023-09-27 DIAGNOSIS — D649 Anemia, unspecified: Secondary | ICD-10-CM | POA: Diagnosis not present

## 2023-09-27 DIAGNOSIS — Z79899 Other long term (current) drug therapy: Secondary | ICD-10-CM | POA: Diagnosis not present

## 2023-09-27 DIAGNOSIS — I5043 Acute on chronic combined systolic (congestive) and diastolic (congestive) heart failure: Secondary | ICD-10-CM | POA: Diagnosis present

## 2023-09-27 DIAGNOSIS — E662 Morbid (severe) obesity with alveolar hypoventilation: Secondary | ICD-10-CM | POA: Diagnosis not present

## 2023-09-27 DIAGNOSIS — N183 Chronic kidney disease, stage 3 unspecified: Secondary | ICD-10-CM

## 2023-09-27 DIAGNOSIS — E1122 Type 2 diabetes mellitus with diabetic chronic kidney disease: Secondary | ICD-10-CM

## 2023-09-27 DIAGNOSIS — J45909 Unspecified asthma, uncomplicated: Secondary | ICD-10-CM | POA: Diagnosis present

## 2023-09-27 DIAGNOSIS — I5033 Acute on chronic diastolic (congestive) heart failure: Secondary | ICD-10-CM

## 2023-09-27 DIAGNOSIS — I5082 Biventricular heart failure: Secondary | ICD-10-CM | POA: Diagnosis not present

## 2023-09-27 DIAGNOSIS — I10 Essential (primary) hypertension: Secondary | ICD-10-CM

## 2023-09-27 DIAGNOSIS — I5023 Acute on chronic systolic (congestive) heart failure: Principal | ICD-10-CM

## 2023-09-27 DIAGNOSIS — E785 Hyperlipidemia, unspecified: Secondary | ICD-10-CM

## 2023-09-27 DIAGNOSIS — Z87891 Personal history of nicotine dependence: Secondary | ICD-10-CM

## 2023-09-27 DIAGNOSIS — Z9851 Tubal ligation status: Secondary | ICD-10-CM

## 2023-09-27 DIAGNOSIS — I429 Cardiomyopathy, unspecified: Secondary | ICD-10-CM | POA: Diagnosis present

## 2023-09-27 DIAGNOSIS — K219 Gastro-esophageal reflux disease without esophagitis: Secondary | ICD-10-CM | POA: Diagnosis not present

## 2023-09-27 DIAGNOSIS — I110 Hypertensive heart disease with heart failure: Secondary | ICD-10-CM

## 2023-09-27 DIAGNOSIS — D509 Iron deficiency anemia, unspecified: Secondary | ICD-10-CM | POA: Insufficient documentation

## 2023-09-27 DIAGNOSIS — Z794 Long term (current) use of insulin: Secondary | ICD-10-CM

## 2023-09-27 DIAGNOSIS — I5032 Chronic diastolic (congestive) heart failure: Secondary | ICD-10-CM

## 2023-09-27 DIAGNOSIS — R0902 Hypoxemia: Secondary | ICD-10-CM | POA: Diagnosis present

## 2023-09-27 DIAGNOSIS — Z23 Encounter for immunization: Secondary | ICD-10-CM

## 2023-09-27 DIAGNOSIS — I509 Heart failure, unspecified: Principal | ICD-10-CM | POA: Insufficient documentation

## 2023-09-27 DIAGNOSIS — I5031 Acute diastolic (congestive) heart failure: Secondary | ICD-10-CM | POA: Diagnosis not present

## 2023-09-27 DIAGNOSIS — R001 Bradycardia, unspecified: Secondary | ICD-10-CM | POA: Diagnosis not present

## 2023-09-27 DIAGNOSIS — Z5982 Transportation insecurity: Secondary | ICD-10-CM

## 2023-09-27 DIAGNOSIS — R0602 Shortness of breath: Secondary | ICD-10-CM | POA: Diagnosis not present

## 2023-09-27 DIAGNOSIS — Z91040 Latex allergy status: Secondary | ICD-10-CM

## 2023-09-27 LAB — CBC WITH DIFFERENTIAL/PLATELET
Abs Immature Granulocytes: 0.03 10*3/uL (ref 0.00–0.07)
Basophils Absolute: 0 10*3/uL (ref 0.0–0.1)
Basophils Relative: 1 %
Eosinophils Absolute: 0.3 10*3/uL (ref 0.0–0.5)
Eosinophils Relative: 4 %
HCT: 26.7 % — ABNORMAL LOW (ref 36.0–46.0)
Hemoglobin: 8.1 g/dL — ABNORMAL LOW (ref 12.0–15.0)
Immature Granulocytes: 1 %
Lymphocytes Relative: 14 %
Lymphs Abs: 0.9 10*3/uL (ref 0.7–4.0)
MCH: 21.6 pg — ABNORMAL LOW (ref 26.0–34.0)
MCHC: 30.3 g/dL (ref 30.0–36.0)
MCV: 71.2 fL — ABNORMAL LOW (ref 80.0–100.0)
Monocytes Absolute: 0.7 10*3/uL (ref 0.1–1.0)
Monocytes Relative: 10 %
Neutro Abs: 4.6 10*3/uL (ref 1.7–7.7)
Neutrophils Relative %: 70 %
Platelets: 187 10*3/uL (ref 150–400)
RBC: 3.75 MIL/uL — ABNORMAL LOW (ref 3.87–5.11)
RDW: 20.4 % — ABNORMAL HIGH (ref 11.5–15.5)
WBC: 6.5 10*3/uL (ref 4.0–10.5)
nRBC: 0 % (ref 0.0–0.2)

## 2023-09-27 LAB — COMPREHENSIVE METABOLIC PANEL
ALT: 25 U/L (ref 0–44)
AST: 23 U/L (ref 15–41)
Albumin: 3.1 g/dL — ABNORMAL LOW (ref 3.5–5.0)
Alkaline Phosphatase: 127 U/L — ABNORMAL HIGH (ref 38–126)
Anion gap: 9 (ref 5–15)
BUN: 46 mg/dL — ABNORMAL HIGH (ref 8–23)
CO2: 19 mmol/L — ABNORMAL LOW (ref 22–32)
Calcium: 8.5 mg/dL — ABNORMAL LOW (ref 8.9–10.3)
Chloride: 114 mmol/L — ABNORMAL HIGH (ref 98–111)
Creatinine, Ser: 3.44 mg/dL — ABNORMAL HIGH (ref 0.44–1.00)
GFR, Estimated: 14 mL/min — ABNORMAL LOW (ref >60)
Glucose, Bld: 181 mg/dL — ABNORMAL HIGH (ref 70–99)
Potassium: 4.8 mmol/L (ref 3.5–5.1)
Sodium: 142 mmol/L (ref 135–145)
Total Bilirubin: 0.5 mg/dL (ref 0.0–1.2)
Total Protein: 6.8 g/dL (ref 6.5–8.1)

## 2023-09-27 LAB — BRAIN NATRIURETIC PEPTIDE: B Natriuretic Peptide: 1677.7 pg/mL — ABNORMAL HIGH (ref 0.0–100.0)

## 2023-09-27 LAB — MAGNESIUM: Magnesium: 2.1 mg/dL (ref 1.7–2.4)

## 2023-09-27 LAB — HIV ANTIBODY (ROUTINE TESTING W REFLEX): HIV Screen 4th Generation wRfx: NONREACTIVE

## 2023-09-27 LAB — GLUCOSE, CAPILLARY: Glucose-Capillary: 169 mg/dL — ABNORMAL HIGH (ref 70–99)

## 2023-09-27 LAB — HEMOGLOBIN A1C
Hgb A1c MFr Bld: 7.5 % — ABNORMAL HIGH (ref 4.8–5.6)
Mean Plasma Glucose: 168.55 mg/dL

## 2023-09-27 MED ORDER — HEPARIN SODIUM (PORCINE) 5000 UNIT/ML IJ SOLN
5000.0000 [IU] | Freq: Three times a day (TID) | INTRAMUSCULAR | Status: DC
  Administered 2023-09-27 – 2023-10-05 (×22): 5000 [IU] via SUBCUTANEOUS
  Filled 2023-09-27 (×23): qty 1

## 2023-09-27 MED ORDER — SODIUM CHLORIDE 0.9% FLUSH
3.0000 mL | Freq: Two times a day (BID) | INTRAVENOUS | Status: DC
  Administered 2023-09-28 – 2023-10-05 (×11): 3 mL via INTRAVENOUS

## 2023-09-27 MED ORDER — INSULIN ASPART 100 UNIT/ML IJ SOLN
0.0000 [IU] | Freq: Three times a day (TID) | INTRAMUSCULAR | Status: DC
  Administered 2023-09-28 (×2): 2 [IU] via SUBCUTANEOUS
  Administered 2023-09-28: 3 [IU] via SUBCUTANEOUS
  Administered 2023-09-29: 5 [IU] via SUBCUTANEOUS
  Administered 2023-09-29: 3 [IU] via SUBCUTANEOUS
  Administered 2023-09-29 – 2023-09-30 (×2): 2 [IU] via SUBCUTANEOUS
  Administered 2023-09-30 – 2023-10-01 (×3): 3 [IU] via SUBCUTANEOUS
  Administered 2023-10-01: 2 [IU] via SUBCUTANEOUS
  Administered 2023-10-02 – 2023-10-03 (×4): 3 [IU] via SUBCUTANEOUS
  Administered 2023-10-03: 2 [IU] via SUBCUTANEOUS
  Administered 2023-10-03 – 2023-10-04 (×4): 3 [IU] via SUBCUTANEOUS
  Administered 2023-10-05: 5 [IU] via SUBCUTANEOUS

## 2023-09-27 MED ORDER — LOSARTAN POTASSIUM 50 MG PO TABS: 50.0000 mg | ORAL_TABLET | Freq: Every day | ORAL | Status: DC

## 2023-09-27 MED ORDER — ATORVASTATIN CALCIUM 10 MG PO TABS
20.0000 mg | ORAL_TABLET | Freq: Every day | ORAL | Status: DC
  Administered 2023-09-27 – 2023-10-05 (×9): 20 mg via ORAL
  Filled 2023-09-27 (×9): qty 2

## 2023-09-27 MED ORDER — LOSARTAN POTASSIUM 50 MG PO TABS
100.0000 mg | ORAL_TABLET | Freq: Every day | ORAL | Status: DC
  Administered 2023-09-28: 100 mg via ORAL
  Filled 2023-09-27: qty 2

## 2023-09-27 MED ORDER — AMLODIPINE BESYLATE 10 MG PO TABS
10.0000 mg | ORAL_TABLET | Freq: Every day | ORAL | Status: DC
  Administered 2023-09-28 – 2023-10-05 (×8): 10 mg via ORAL
  Filled 2023-09-27 (×8): qty 1

## 2023-09-27 MED ORDER — FUROSEMIDE 10 MG/ML IJ SOLN
80.0000 mg | Freq: Once | INTRAMUSCULAR | Status: AC
  Administered 2023-09-27: 80 mg via INTRAVENOUS
  Filled 2023-09-27: qty 8

## 2023-09-27 MED ORDER — ACETAMINOPHEN 325 MG PO TABS: 650.0000 mg | ORAL_TABLET | ORAL | Status: DC | PRN

## 2023-09-27 MED ORDER — SODIUM CHLORIDE 0.9% FLUSH: 3.0000 mL | INTRAVENOUS | Status: DC | PRN

## 2023-09-27 MED ORDER — SODIUM CHLORIDE 0.9 % IV SOLN: 250.0000 mL | INTRAVENOUS | Status: AC | PRN

## 2023-09-27 MED ORDER — AMLODIPINE BESYLATE 10 MG PO TABS: 10.0000 mg | ORAL_TABLET | Freq: Every day | ORAL | Status: DC

## 2023-09-27 MED ORDER — DAPAGLIFLOZIN PROPANEDIOL 10 MG PO TABS: 10.0000 mg | ORAL_TABLET | Freq: Every day | ORAL | Status: DC

## 2023-09-27 MED ORDER — HYDRALAZINE HCL 50 MG PO TABS
100.0000 mg | ORAL_TABLET | Freq: Three times a day (TID) | ORAL | Status: DC
  Administered 2023-09-27 – 2023-10-05 (×23): 100 mg via ORAL
  Filled 2023-09-27 (×23): qty 2

## 2023-09-27 MED ORDER — CARVEDILOL 6.25 MG PO TABS
6.2500 mg | ORAL_TABLET | Freq: Two times a day (BID) | ORAL | Status: DC
  Administered 2023-09-27 – 2023-09-28 (×3): 6.25 mg via ORAL
  Filled 2023-09-27 (×3): qty 1

## 2023-09-27 NOTE — Patient Instructions (Signed)
 GO TO [facility] AND THEY WILL HAVE A ROOM PER [facility] WITH BED CONTROL

## 2023-09-27 NOTE — H&P (Signed)
 Cardiology Admission History and Physical   Patient ID: Lori Mclaughlin MRN: [id number]; DOB: [birth year]   Admission date: 09/27/2023  PCP:  Olive Bass, FNP   [facility] Providers Cardiologist:  Armanda Magic, MD        Chief Complaint:  SOB  Patient Profile:   Lori Mclaughlin is a 63 y.o. female with asthma, chronic diastolic CHF, CKDIV, diabetes mellitus type 2 (uncontrolled), GERD, hypertension, microcytic anemia, morbid obesity, OSA with nocturnal hypoxemia, and osteoarthritis who presents as direct admission from clinic with acute on chronic HFpEF.   History of Present Illness:   Lori Mclaughlin is a 63 y.o. female with asthma, chronic diastolic CHF, CKDIV, diabetes mellitus type 2 (uncontrolled), GERD, hypertension, microcytic anemia, morbid obesity, OSA with nocturnal hypoxemia, and osteoarthritis who presents as direct admission from clinic with acute on chronic HFpEF.   Per clinic note, uncontrolled BP with progressive renal dysfunction. Is currently on amlodipine, Coreg, hydral and losartan. She was admitted 07/2023 with shortness of breath.  She was also noted to have lower extremity edema.  She had not been on her diuretics since 3 months prior.  Blood pressure was 186/86.  An echocardiogram was not performed.  She was given IV Lasix and her oral diuretics were resumed.  BNP was 1349.  Creatinine was 3.5.  High-sensitivity troponin was flat between 23 and 25.     Continues experiencing worsening shortness of breath and swelling since October, with increased LE edema and medication difficulties with an insurance switch. Despite resuming her diuretic medication, Lasix 20 mg twice daily, she continues to experience significant swelling and shortness of breath. Her breathing is slightly improved today but worsens with minimal physical activity. She remains approximately 15 pounds above her usual weight of 250 pounds, although she has lost 10  pounds over the past month.  She reports orthopnea and PND.  Has not seen nephrologist in some time.    Past Medical History:  Diagnosis Date   Allergic rhinitis    Asthma    Atypical chest pain    a. 03/2014: normal nuclear stress test.   Chronic diastolic CHF (congestive heart failure) (HCC)    a. Dx 03/2014 with echo - moderate LVH, EF 55-60%, grade 1 diastolic dysfunction, mildly dilated LA.   Chronic diastolic heart failure (HCC) 03/07/2021   CKD (chronic kidney disease), stage II    Diabetes mellitus type II    GERD (gastroesophageal reflux disease)    Headache(784.0)    when my bp is up   Hypertension    Hypertensive heart disease    Microcytic anemia    OSA (obstructive sleep apnea)    severe with AHI 77/hr with nocturnal hypoxemia   Osteoarthritis    Right upper quadrant pain 06/04/2022    Past Surgical History:  Procedure Laterality Date   SHOULDER ARTHROSCOPY Bilateral    TUBAL LIGATION       Medications Prior to Admission: Prior to Admission medications   Medication Sig Start Date End Date Taking? Authorizing Provider  allopurinol (ZYLOPRIM) 300 MG tablet TAKE 1 TABLET BY MOUTH DAILY Patient not taking: Reported on 09/27/2023 07/17/22   Olive Bass, FNP  amLODipine (NORVASC) 10 MG tablet Take 1 tablet (10 mg total) by mouth daily. 06/04/22   Chilton Si, MD  atorvastatin (LIPITOR) 20 MG tablet Take 1 tablet (20 mg total) by mouth daily. *Need appointment for further refills, call [phone].* 09/06/23   Chilton Si, MD  blood glucose meter  kit and supplies Dispense based on patient and insurance preference. Use up to four times daily as directed. (FOR ICD-10 E10.9, E11.9). 03/24/22   Olive Bass, FNP  carvedilol (COREG) 6.25 MG tablet TAKE 1 TABLET BY MOUTH TWICE  DAILY WITH MEALS 07/17/22   Chilton Si, MD  Continuous Glucose Sensor (DEXCOM G7 SENSOR) MISC 1 Device by Does not apply route as directed. 07/16/23   Shamleffer,  Konrad Dolores, MD  dapagliflozin propanediol (FARXIGA) 10 MG TABS tablet Take 1 tablet (10 mg total) by mouth daily. 08/09/23   Prosperi, Christian H, PA-C  fluticasone (FLONASE) 50 MCG/ACT nasal spray Place 2 sprays into both nostrils daily. Patient taking differently: Place 2 sprays into both nostrils daily as needed. 06/13/21   Olive Bass, FNP  furosemide (LASIX) 20 MG tablet Take 1 tablet (20 mg total) by mouth 2 (two) times daily. For the first 5 days please take 40 mg in the morning and 20 mg at night, then you can return to your normal twice daily dosing 08/09/23   Prosperi, Christian H, PA-C  hydrALAZINE (APRESOLINE) 100 MG tablet TAKE 1 TABLET BY MOUTH 3 TIMES  DAILY 05/04/22   Turner, Cornelious Bryant, MD  insulin aspart (NOVOLOG FLEXPEN) 100 UNIT/ML FlexPen Inject 45 Units into the skin daily. 08/09/23   Prosperi, Christian H, PA-C  insulin glargine (LANTUS SOLOSTAR) 100 UNIT/ML Solostar Pen Inject 50 Units into the skin daily. In the afternoon 08/09/23   Prosperi, Christian H, PA-C  Insulin Pen Needle 32G X 4 MM MISC 1 Device by Does not apply route in the morning, at noon, in the evening, and at bedtime. 12/10/22   Shamleffer, Konrad Dolores, MD  losartan (COZAAR) 50 MG tablet Take 1 tablet (50 mg total) by mouth daily. 06/04/22   Chilton Si, MD  ondansetron (ZOFRAN-ODT) 4 MG disintegrating tablet Take 1 tablet (4 mg total) by mouth every 8 (eight) hours as needed. 05/27/22   Karie Mainland, Amjad, PA-C  pantoprazole (PROTONIX) 40 MG tablet Take 1 tablet (40 mg total) by mouth daily. Patient not taking: Reported on 09/07/2023 05/27/22   Marita Kansas, PA-C     Allergies:    Allergies  Allergen Reactions   Lisinopril Other (See Comments)    Headache   Latex Itching and Rash    Social History:   Social History   Socioeconomic History   Marital status: Divorced    Spouse name: Not on file   Number of children: Not on file   Years of education: Not on file   Highest education level: Some  college, no degree  Occupational History   Not on file  Tobacco Use   Smoking status: Former    Current packs/day: 0.00    Types: Cigarettes    Quit date: 08/29/2010    Years since quitting: 13.0   Smokeless tobacco: Never  Vaping Use   Vaping status: Never Used  Substance and Sexual Activity   Alcohol use: No    Alcohol/week: 5.0 standard drinks of alcohol    Types: 5 Glasses of wine per week   Drug use: No   Sexual activity: Yes    Birth control/protection: Surgical  Other Topics Concern   Not on file  Social History Narrative   Regular Exercise- yes   Social Drivers of Health   Financial Resource Strain: Low Risk  (09/06/2023)   Overall Financial Resource Strain (CARDIA)    Difficulty of Paying Living Expenses: Not hard at all  Food Insecurity: No Food  Insecurity (09/06/2023)   Hunger Vital Sign    Worried About Running Out of Food in the Last Year: Never true    Ran Out of Food in the Last Year: Never true  Transportation Needs: Unmet Transportation Needs (09/06/2023)   PRAPARE - Administrator, Civil Service (Medical): Yes    Lack of Transportation (Non-Medical): No  Physical Activity: Unknown (09/06/2023)   Exercise Vital Sign    Days of Exercise per Week: 0 days    Minutes of Exercise per Session: Not on file  Stress: No Stress Concern Present (09/06/2023)   [employer] of Occupational Health - Occupational Stress Questionnaire    Feeling of Stress : Not at all  Social Connections: Moderately Isolated (09/06/2023)   Social Connection and Isolation Panel [NHANES]    Frequency of Communication with Friends and Family: More than three times a week    Frequency of Social Gatherings with Friends and Family: Once a week    Attends Religious Services: More than 4 times per year    Active Member of [employer] or Organizations: No    Attends Engineer, structural: Not on file    Marital Status: Divorced  Catering manager Violence: Not on file    Family  History:   The patient's family history includes Cancer in her father and another family member; Diabetes in an other family member; Heart attack in her mother; Hypertension in her brother, maternal grandfather, maternal grandmother, mother, and another family member; Stroke in an other family member. There is no history of Kidney disease, Alcohol abuse, Asthma, COPD, Depression, Drug abuse, Early death, Hearing loss, Heart disease, or Hyperlipidemia.    ROS:  Please see the history of present illness.  All other ROS reviewed and negative.     Physical Exam/Data:   Vitals:   09/27/23 1942  BP: (!) 138/106  Pulse: (!) 58  Resp: 18  Temp: 98 F (36.7 C)  TempSrc: Oral  SpO2: 100%  Weight: 127.5 kg   No intake or output data in the 24 hours ending 09/27/23 2042    09/27/2023    7:42 PM 09/27/2023    4:48 PM 09/07/2023    1:21 PM  Last 3 Weights  Weight (lbs) 281 lb 1.4 oz 264 lb 274 lb  Weight (kg) 127.5 kg 119.75 kg 124.286 kg     Body mass index is 54.9 kg/m.  General:  Well nourished, well developed, in no acute distress HEENT: normal Neck:  JVD at 14 mmHG Vascular: No carotid bruits; Distal pulses 2+ bilaterally   Cardiac:  normal S1, S2; RRR; no murmur  Lungs:  clear to auscultation bilaterally except for bases, no wheezing, rhonchi  Abd: soft, nontender, no hepatomegaly  Ext: 3+ edema to mid thigh Musculoskeletal:  No deformities, BUE and BLE strength normal and equal Skin: warm and dry  Neuro:  CNs 2-12 intact, no focal abnormalities noted Psych:  Normal affect   EKG:  Sinus with RBBB  Relevant CV Studies: Echo 02/2021: IMPRESSIONS   1. Left ventricular ejection fraction, by estimation, is 60 to 65%. The  left ventricle has normal function. The left ventricle has no regional  wall motion abnormalities. There is mild concentric left ventricular  hypertrophy. Left ventricular diastolic  parameters are consistent with Grade II diastolic dysfunction   (pseudonormalization).   2. Right ventricular systolic function is normal. The right ventricular  size is normal. Tricuspid regurgitation signal is inadequate for assessing  PA pressure.  3. The mitral valve is grossly normal. Trivial mitral valve  regurgitation. No evidence of mitral stenosis.   4. The aortic valve is tricuspid. Aortic valve regurgitation is not  visualized. No aortic stenosis is present.   5. The inferior vena cava is dilated in size with >50% respiratory  variability, suggesting right atrial pressure of 8 mmHg.     Laboratory Data:  High Sensitivity Troponin:  No results for input(s): "TROPONINIHS" in the last 720 hours.    ChemistryNo results for input(s): "NA", "K", "CL", "CO2", "GLUCOSE", "BUN", "CREATININE", "CALCIUM", "MG", "GFRNONAA", "GFRAA", "ANIONGAP" in the last 168 hours.  No results for input(s): "PROT", "ALBUMIN", "AST", "ALT", "ALKPHOS", "BILITOT" in the last 168 hours. Lipids No results for input(s): "CHOL", "TRIG", "HDL", "LABVLDL", "LDLCALC", "CHOLHDL" in the last 168 hours. HematologyNo results for input(s): "WBC", "RBC", "HGB", "HCT", "MCV", "MCH", "MCHC", "RDW", "PLT" in the last 168 hours. Thyroid No results for input(s): "TSH", "FREET4" in the last 168 hours. BNPNo results for input(s): "BNP", "PROBNP" in the last 168 hours.  DDimer No results for input(s): "DDIMER" in the last 168 hours.   Radiology/Studies:  No results found.   Assessment and Plan:   # Acute on chronic HFpEF: Worsening shortness of breath and edema from toes to thighs. Recent hospital visit with elevated BNP and acute on chronic renal failure. Here for repeat TTE and IV diuresis.  -we have repeat labs cooking at this time - once labs return with start IV diuretics, suspect she may need 80-120 IV given worsening renal failure - TTE - BP control as below - holding ARB and SGLT2i until labs - continue coreg, hydral and amlodipine    # Hypertension Uncontrolled at  this time - continue amlodipine, Coreg and hydral - hold ARB and SGLT2i until Cr returns  # AoCKD:  Repeat BMP pending    Risk Assessment/Risk Scores:       New York Heart Association (NYHA) Functional Class NYHA Class III    Code Status: Full Code  Severity of Illness: The appropriate patient status for this patient is INPATIENT. Inpatient status is judged to be reasonable and necessary in order to provide the required intensity of service to ensure the patient's safety. The patient's presenting symptoms, physical exam findings, and initial radiographic and laboratory data in the context of their chronic comorbidities is felt to place them at high risk for further clinical deterioration. Furthermore, it is not anticipated that the patient will be medically stable for discharge from the hospital within 2 midnights of admission.   * I certify that at the point of admission it is my clinical judgment that the patient will require inpatient hospital care spanning beyond 2 midnights from the point of admission due to high intensity of service, high risk for further deterioration and high frequency of surveillance required.*   For questions or updates, please contact [facility] Please consult [email or web address] for contact info under     Signed, Thana Ates, MD  09/27/2023 8:42 PM

## 2023-09-27 NOTE — Progress Notes (Signed)
 Cardiology Office Note:  .   Date:  09/27/2023  ID:  ZAN TRISKA, DOB Feb 17, 1961, MRN [id number] PCP: Olive Bass, FNP  [facility] Providers Cardiologist:  Armanda Magic, MD    History of Present Illness: .    Lori Mclaughlin is a 63 y.o. female with asthma, chronic diastolic CHF, CKDIV, diabetes mellitus type 2 (uncontrolled), GERD, hypertension, microcytic anemia, morbid obesity, OSA with nocturnal hypoxemia, and osteoarthritis, here for follow up.  She first established care in the Advanced Hypertension clinic 03/15/21  She saw Dr. Mayford Knife 01/02/2021 for follow-up. At that visit her blood pressure was 222/99.  On 01/21/2021 she was referred to advanced hypertension clinic by Dr. Mayford Knife after [name] reported diarrhea and cramping side effects from 75 mg hydralazine. She was admitted to the hospital 09/2020 for acute on chronic diastolic heart failure and hypertensive emergency. Her blood pressure was 208/94 on admission. She was diuresed with IV Lasix. Her hospitalization was complicated by acute on chronic renal failure. Her ARB was held at discharge. She had recently been prescriped doxazosin but hadn't started it yet. She has known sleep apnea and is in the process of getting a CPAP. She had bradycardia in the 30's while sleeping in the hospital, therefore carvedilol was decreased. She had an Echo on admission with LVEF 60-65% and grade 2 diastolic dysfunction. She wore an ambulatory blood pressure monitor 12/2020 that showed her blood pressure was averaging 180s/80s. 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   Doxazosin was discontinued and she was started on Clonidine. She was referred to the PREP program. She followed up with our pharmacist 08/15/21. Her blood pressure remained uncontrolled so Clonidine was increased. She was also working with her nephrologist for hypertension.  At her visit 05/2022  losartan and amlodipine were increased.   [name] was admitted 07/2023 with shortness of breath.  She was also noted to have lower extremity edema.  She had not been on her diuretics since 3 months prior.  Blood pressure was 186/86.  An echocardiogram was not performed.  She was given IV Lasix and her oral diuretics were resumed.  BNP was 1349.  Creatinine was 3.5.  High-sensitivity troponin was flat between 23 and 25.  I recommended close outpatient follow-up with her primary.  [name] has been experiencing worsening shortness of breath and swelling since October, with the swelling extending from her toes to her thighs. These symptoms began after a lapse in her medication due to insurance changes. Despite resuming her diuretic medication, Lasix 20 mg twice daily, she continues to experience significant swelling and shortness of breath. Her breathing is slightly improved today but worsens with minimal physical activity. She remains approximately 10 pounds above her usual weight of 250 pounds, although she has lost 10 pounds over the past month.  She reports orthopnea and PND.  She saw her PCP and was directed to follow up with cardiology.  She has not seen her nephrologist, Dr. Malen Mclaughlin, in over a year.  She reports that she typically tries to avoid seeing the doctor unless she is feeling very poorly.   [name] lives with her daughter, grandchildren, and her daughter's husband and does not drive, relying on her family for transportation.    Previous antihypertensives: Lisinopril Clonidine Doxazosin- wobbly ROS:  As per HPI  Studies Reviewed: .       Echo 02/2021: IMPRESSIONS   1. Left ventricular ejection fraction,  by estimation, is 60 to 65%. The  left ventricle has normal function. The left ventricle has no regional  wall motion abnormalities. There is mild concentric left ventricular  hypertrophy. Left ventricular diastolic  parameters are consistent with Grade  II diastolic dysfunction  (pseudonormalization).   2. Right ventricular systolic function is normal. The right ventricular  size is normal. Tricuspid regurgitation signal is inadequate for assessing  PA pressure.   3. The mitral valve is grossly normal. Trivial mitral valve  regurgitation. No evidence of mitral stenosis.   4. The aortic valve is tricuspid. Aortic valve regurgitation is not  visualized. No aortic stenosis is present.   5. The inferior vena cava is dilated in size with >50% respiratory  variability, suggesting right atrial pressure of 8 mmHg.   Risk Assessment/Calculations:     HYPERTENSION CONTROL Vitals:   09/27/23 1648 09/27/23 1741  BP: (!) 187/68 (!) 184/70    The patient's blood pressure is elevated above target today.  In order to address the patient's elevated BP: Labs and/or other diagnostics are currently pending prior to making blood pressure medication adjustments.    Physical Exam:   VS:  BP (!) 184/70   Pulse 64   Ht 5' (1.524 m)   Wt 264 lb (119.7 kg)   LMP 02/23/2012   SpO2 98%   BMI 51.56 kg/m  , BMI Body mass index is 51.56 kg/m. GENERAL:  Well appearing HEENT: Pupils equal round and reactive, fundi not visualized, oral mucosa unremarkable NECK:  JVP to mid neck at 45 degrees.  Waveform within normal limits, carotid upstroke brisk and symmetric, no bruits, no thyromegaly LYMPHATICS:  No cervical adenopathy LUNGS:  Clear to auscultation bilaterally HEART:  RRR.  PMI not displaced or sustained,S1 and S2 within normal limits, no S3, no S4, no clicks, no rubs, no murmurs ABD:  Flat, positive bowel sounds normal in frequency in pitch, no bruits, no rebound, no guarding, no midline pulsatile mass, no hepatomegaly, no splenomegaly EXT:  2 plus pulses throughout, pitting edema to the knees bilaterally, no cyanosis no clubbing SKIN:  No rashes no nodules NEURO:  Cranial nerves II through XII grossly intact, motor grossly intact throughout PSYCH:   Cognitively intact, oriented to person place and time   ASSESSMENT AND PLAN: .    # Acute on chronic HFpEF: Worsening shortness of breath and edema from toes to thighs. Recent hospital visit with elevated BNP and acute on chronic renal failure. She needs admission with an echo and IV diuretics.  Renal function needs to be monitored closely.  Our lab is closed for the day and I do not think that she can be managed safely as an outpatient.   -Admit to hospital for closer monitoring and management of fluid status with IV Lasix. -Check creatinine levels regularly during hospital stay to monitor kidney function. -Plan for echocardiogram to assess cardiac function. -Start lasix 40 to 80mg  IV pending admission lab results. She has been taking 20mg  po bid;  # Hypertension Elevated blood pressure noted during visit. -Continue current antihypertensive regimen and monitor blood pressure closely during hospital stay.  Hopefully BP will improve with diuresis as options are limited by renal function and intolerance.     # AoCKD:  Repeat BMP on admission.    Dispo: to be determined prior to discharge  Signed, Chilton Si, MD

## 2023-09-28 ENCOUNTER — Inpatient Hospital Stay (HOSPITAL_COMMUNITY)

## 2023-09-28 ENCOUNTER — Ambulatory Visit: Payer: Medicaid Other | Admitting: Internal Medicine

## 2023-09-28 ENCOUNTER — Encounter (HOSPITAL_COMMUNITY): Payer: Self-pay | Admitting: Cardiovascular Disease

## 2023-09-28 ENCOUNTER — Encounter: Payer: Self-pay | Admitting: Internal Medicine

## 2023-09-28 ENCOUNTER — Other Ambulatory Visit: Payer: Self-pay

## 2023-09-28 DIAGNOSIS — I5033 Acute on chronic diastolic (congestive) heart failure: Secondary | ICD-10-CM | POA: Diagnosis not present

## 2023-09-28 DIAGNOSIS — I10 Essential (primary) hypertension: Secondary | ICD-10-CM | POA: Diagnosis not present

## 2023-09-28 DIAGNOSIS — N179 Acute kidney failure, unspecified: Secondary | ICD-10-CM | POA: Diagnosis not present

## 2023-09-28 DIAGNOSIS — I5031 Acute diastolic (congestive) heart failure: Secondary | ICD-10-CM

## 2023-09-28 DIAGNOSIS — I5043 Acute on chronic combined systolic (congestive) and diastolic (congestive) heart failure: Secondary | ICD-10-CM | POA: Diagnosis not present

## 2023-09-28 LAB — MAGNESIUM: Magnesium: 1.9 mg/dL (ref 1.7–2.4)

## 2023-09-28 LAB — IRON AND TIBC
Iron: 23 ug/dL — ABNORMAL LOW (ref 28–170)
Saturation Ratios: 7 % — ABNORMAL LOW (ref 10.4–31.8)
TIBC: 330 ug/dL (ref 250–450)
UIBC: 307 ug/dL

## 2023-09-28 LAB — CBC WITH DIFFERENTIAL/PLATELET
Abs Immature Granulocytes: 0.02 10*3/uL (ref 0.00–0.07)
Basophils Absolute: 0 10*3/uL (ref 0.0–0.1)
Basophils Relative: 0 %
Eosinophils Absolute: 0.3 10*3/uL (ref 0.0–0.5)
Eosinophils Relative: 5 %
HCT: 24.6 % — ABNORMAL LOW (ref 36.0–46.0)
Hemoglobin: 7.5 g/dL — ABNORMAL LOW (ref 12.0–15.0)
Immature Granulocytes: 0 %
Lymphocytes Relative: 15 %
Lymphs Abs: 0.8 10*3/uL (ref 0.7–4.0)
MCH: 21.6 pg — ABNORMAL LOW (ref 26.0–34.0)
MCHC: 30.5 g/dL (ref 30.0–36.0)
MCV: 70.7 fL — ABNORMAL LOW (ref 80.0–100.0)
Monocytes Absolute: 0.6 10*3/uL (ref 0.1–1.0)
Monocytes Relative: 12 %
Neutro Abs: 3.7 10*3/uL (ref 1.7–7.7)
Neutrophils Relative %: 68 %
Platelets: 168 10*3/uL (ref 150–400)
RBC: 3.48 MIL/uL — ABNORMAL LOW (ref 3.87–5.11)
RDW: 20.2 % — ABNORMAL HIGH (ref 11.5–15.5)
WBC: 5.4 10*3/uL (ref 4.0–10.5)
nRBC: 0 % (ref 0.0–0.2)

## 2023-09-28 LAB — ECHOCARDIOGRAM COMPLETE
AV Mean grad: 4 mmHg
AV Peak grad: 6.5 mmHg
Ao pk vel: 1.27 m/s
Area-P 1/2: 3.23 cm2
Height: 60 in
S' Lateral: 3.3 cm
Weight: 4449.6 [oz_av]

## 2023-09-28 LAB — BASIC METABOLIC PANEL
Anion gap: 4 — ABNORMAL LOW (ref 5–15)
BUN: 48 mg/dL — ABNORMAL HIGH (ref 8–23)
CO2: 21 mmol/L — ABNORMAL LOW (ref 22–32)
Calcium: 8.3 mg/dL — ABNORMAL LOW (ref 8.9–10.3)
Chloride: 114 mmol/L — ABNORMAL HIGH (ref 98–111)
Creatinine, Ser: 3.39 mg/dL — ABNORMAL HIGH (ref 0.44–1.00)
GFR, Estimated: 15 mL/min — ABNORMAL LOW (ref >60)
Glucose, Bld: 139 mg/dL — ABNORMAL HIGH (ref 70–99)
Potassium: 4.6 mmol/L (ref 3.5–5.1)
Sodium: 139 mmol/L (ref 135–145)

## 2023-09-28 LAB — GLUCOSE, CAPILLARY
Glucose-Capillary: 131 mg/dL — ABNORMAL HIGH (ref 70–99)
Glucose-Capillary: 150 mg/dL — ABNORMAL HIGH (ref 70–99)
Glucose-Capillary: 166 mg/dL — ABNORMAL HIGH (ref 70–99)
Glucose-Capillary: 128 mg/dL — ABNORMAL HIGH (ref 70–99)
Glucose-Capillary: 148 mg/dL — ABNORMAL HIGH (ref 70–99)

## 2023-09-28 MED ORDER — PERFLUTREN LIPID MICROSPHERE
1.0000 mL | INTRAVENOUS | Status: AC | PRN
  Administered 2023-09-28: 2 mL via INTRAVENOUS

## 2023-09-28 MED ORDER — FUROSEMIDE 10 MG/ML IJ SOLN
80.0000 mg | Freq: Two times a day (BID) | INTRAMUSCULAR | Status: DC
Start: 2023-09-28 — End: 2023-09-29
  Administered 2023-09-28 (×2): 80 mg via INTRAVENOUS
  Filled 2023-09-28 (×2): qty 8

## 2023-09-28 MED ORDER — INFLUENZA VIRUS VACC SPLIT PF (FLUZONE) 0.5 ML IM SUSY
0.5000 mL | PREFILLED_SYRINGE | INTRAMUSCULAR | Status: AC
  Administered 2023-09-28: 0.5 mL via INTRAMUSCULAR
  Filled 2023-09-28: qty 0.5

## 2023-09-28 NOTE — Inpatient Diabetes Management (Signed)
 Inpatient Diabetes Program Recommendations  AACE/ADA: New Consensus Statement on Inpatient Glycemic Control (2015)  Target Ranges:  Prepandial:   less than 140 mg/dL      Peak postprandial:   less than 180 mg/dL (1-2 hours)      Critically ill patients:  140 - 180 mg/dL   Lab Results  Component Value Date   GLUCAP 131 (H) 09/28/2023   HGBA1C 7.5 (H) 09/27/2023    Review of Glycemic Control  Latest Reference Range & Units 09/27/23 21:34 09/28/23 07:15  Glucose-Capillary 70 - 99 mg/dL 161 (H) 096 (H)   Diabetes history: DM 2 Outpatient Diabetes medications: Lantus 50 units qpm, Novolog 45 units Daily, Farxiga 10 mg Daily Current orders for Inpatient glycemic control:  Novolog 0-15 units tid  Consult for Diabetes Updated A1c 7.5% on 3/3  Inpatient Diabetes Program Recommendations:    Renal function elevated Glucose trends at goal  -   May need to consider reducing Novolog correction scale to 0-9 units tid + hs due to renal function  Will follow. No immediate needs.  Thanks,  Christena Deem RN, MSN, BC-ADM Inpatient Diabetes Coordinator Team Pager [phone] (8a-5p)

## 2023-09-28 NOTE — Plan of Care (Signed)
  Problem: Education: Goal: Knowledge of General Education information will improve Description: Including pain rating scale, medication(s)/side effects and non-pharmacologic comfort measures Outcome: Progressing   Problem: Nutrition: Goal: Adequate nutrition will be maintained Outcome: Completed/Met   Problem: Elimination: Goal: Will not experience complications related to urinary retention Outcome: Completed/Met

## 2023-09-28 NOTE — Progress Notes (Signed)
 Mobility Specialist Progress Note;   09/28/23 1142  Mobility  Activity Ambulated with assistance in hallway  Level of Assistance Contact guard assist, steadying assist  Assistive Device Cane  Distance Ambulated (ft) 60 ft  Activity Response Tolerated fair  Mobility Referral Yes  Mobility visit 1 Mobility  Mobility Specialist Start Time (ACUTE ONLY) 1142  Mobility Specialist Stop Time (ACUTE ONLY) 1200  Mobility Specialist Time Calculation (min) (ACUTE ONLY) 18 min   Pt eager for mobility. Required MinG assistance throughout for safety. Took 2x standing rest break d/t SOB, pt requesting to return back to room. VSS throughout, although SOB displayed. Pt requesting to sit in chair at EOS. Pt left in chair with all needs met. RN in room.   Caesar Bookman Mobility Specialist Please contact via SecureChat or [employer] [phone]

## 2023-09-28 NOTE — Progress Notes (Addendum)
 Patient Name: Lori Mclaughlin Date of Encounter: 09/28/2023 [facility] Cardiologist: Armanda Magic, MD   Interval Summary  .    Patient resting comfortably in the bed. She denies any chest pain, lightheadedness, dizziness She reports some shortness of breath when she gets up to go to the bathroom She reports a good urine output response from her single dose of IV Lasix  Awaiting her morning BMP to determine appropriate dosing of continued IV diuresis   Vital Signs .    Vitals:   09/27/23 2307 09/28/23 0021 09/28/23 0319 09/28/23 0721  BP: (!) 162/66  (!) 157/62 (!) 164/64  Pulse: (!) 58  (!) 52 (!) 52  Resp: 16  18 18   Temp: 98.1 F (36.7 C)  98.4 F (36.9 C) 97.8 F (36.6 C)  TempSrc: Oral  Oral Oral  SpO2: 98% 99% 96% 99%  Weight:   126.1 kg     Intake/Output Summary (Last 24 hours) at 09/28/2023 0837 Last data filed at 09/28/2023 0718 Gross per 24 hour  Intake --  Output 1400 ml  Net -1400 ml      09/28/2023    3:19 AM 09/27/2023    7:42 PM 09/27/2023    4:48 PM  Last 3 Weights  Weight (lbs) 278 lb 1.6 oz 281 lb 1.4 oz 264 lb  Weight (kg) 126.145 kg 127.5 kg 119.75 kg      Telemetry/ECG    Sinus bradycardia , HR 50-60s - Personally Reviewed  Physical Exam .   GEN: No acute distress, on room air.   Neck: mild JVD Cardiac: RRR, no murmurs, rubs, or gallops.  Respiratory: Clear to auscultation bilaterally, distant at bases  GI: Soft, nontender, non-distended  MS: 3+ pitting edema bilateral LE  Assessment & Plan .   Lori Mclaughlin is a 63 y.o. female with asthma, chronic diastolic CHF, CKDIV, diabetes mellitus type 2 (uncontrolled), GERD, hypertension, microcytic anemia, morbid obesity, OSA with nocturnal hypoxemia, and osteoarthritis who presented as direct admission from clinic with acute on chronic HFpEF.    Acute on chronic HFpEF Patient was seen by Dr. Duke Salvia in clinic 09/27/23 who recommended she be admitted to the hospital for  further treatment  Patient reported worsening shortness of breath and swelling since October Patient was currently taking Lasix 20 mg twice daily, was still having significant swelling and shortness of breath BNP 1,677 Admission weight: 281 lb Creatinine 3.44 on admission  Was given IV Lasix 80 mg x 1 on 3/3 -- weight down to 278 lb Pending updated BMP to guide Lasix dose Ordered updated echo, pending results HR in 40s, will decrease CoReg to 3.125 mg BID Continue hydralazine 100 mg TID Continue amlodipine 10 mg daily Hold losartan Holding SGLT2i at this time  Hypertension Most recent BP 164/64 HR in 40s, will decrease CoReg to 3.125 mg BID Continue hydralazine 100 mg TID Continue amlodipine 10 mg daily Hold losartan -- will discuss with MD, patient was restarted this AM Holding SGLT2i at this time   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  Agree with above documentation.  On exam, patient is alert and oriented, regular rate and rhythm, no murmurs, lungs CTAB, 2+LE edema, +JVD. She was directly admitted from clinic for IV diuresis.  Labs notable for creatinine 3.44, BNP 1678.  She was started on diuresis with IV Lasix 80 mg, had 1.2 L urine output overnight.  She reports feeling improved.  Creatinine stable at 3.39 this morning.  BP 164/64.  Will continue diuresis and follow-up echocardiogram.  Little Ishikawa, MD   Little Ishikawa,  MD

## 2023-09-29 ENCOUNTER — Other Ambulatory Visit (HOSPITAL_COMMUNITY): Payer: Self-pay

## 2023-09-29 ENCOUNTER — Telehealth (HOSPITAL_COMMUNITY): Payer: Self-pay

## 2023-09-29 DIAGNOSIS — I5043 Acute on chronic combined systolic (congestive) and diastolic (congestive) heart failure: Secondary | ICD-10-CM

## 2023-09-29 DIAGNOSIS — I5033 Acute on chronic diastolic (congestive) heart failure: Secondary | ICD-10-CM | POA: Diagnosis not present

## 2023-09-29 DIAGNOSIS — N179 Acute kidney failure, unspecified: Secondary | ICD-10-CM | POA: Diagnosis not present

## 2023-09-29 DIAGNOSIS — I10 Essential (primary) hypertension: Secondary | ICD-10-CM | POA: Diagnosis not present

## 2023-09-29 LAB — CBC
HCT: 25.8 % — ABNORMAL LOW (ref 36.0–46.0)
Hemoglobin: 7.9 g/dL — ABNORMAL LOW (ref 12.0–15.0)
MCH: 21.4 pg — ABNORMAL LOW (ref 26.0–34.0)
MCHC: 30.6 g/dL (ref 30.0–36.0)
MCV: 69.9 fL — ABNORMAL LOW (ref 80.0–100.0)
Platelets: 183 10*3/uL (ref 150–400)
RBC: 3.69 MIL/uL — ABNORMAL LOW (ref 3.87–5.11)
RDW: 19.9 % — ABNORMAL HIGH (ref 11.5–15.5)
WBC: 5.9 10*3/uL (ref 4.0–10.5)
nRBC: 0 % (ref 0.0–0.2)

## 2023-09-29 LAB — BASIC METABOLIC PANEL
Anion gap: 11 (ref 5–15)
BUN: 56 mg/dL — ABNORMAL HIGH (ref 8–23)
CO2: 20 mmol/L — ABNORMAL LOW (ref 22–32)
Calcium: 8.7 mg/dL — ABNORMAL LOW (ref 8.9–10.3)
Chloride: 111 mmol/L (ref 98–111)
Creatinine, Ser: 3.85 mg/dL — ABNORMAL HIGH (ref 0.44–1.00)
GFR, Estimated: 12 mL/min — ABNORMAL LOW (ref >60)
Glucose, Bld: 115 mg/dL — ABNORMAL HIGH (ref 70–99)
Potassium: 4.7 mmol/L (ref 3.5–5.1)
Sodium: 142 mmol/L (ref 135–145)

## 2023-09-29 LAB — GLUCOSE, CAPILLARY
Glucose-Capillary: 128 mg/dL — ABNORMAL HIGH (ref 70–99)
Glucose-Capillary: 154 mg/dL — ABNORMAL HIGH (ref 70–99)
Glucose-Capillary: 208 mg/dL — ABNORMAL HIGH (ref 70–99)
Glucose-Capillary: 196 mg/dL — ABNORMAL HIGH (ref 70–99)

## 2023-09-29 LAB — LIPID PANEL
Cholesterol: 89 mg/dL (ref 0–200)
HDL: 46 mg/dL (ref >40)
LDL Cholesterol: 34 mg/dL (ref 0–99)
Total CHOL/HDL Ratio: 1.9 ratio
Triglycerides: 46 mg/dL (ref <150)
VLDL: 9 mg/dL (ref 0–40)

## 2023-09-29 LAB — MAGNESIUM: Magnesium: 2.1 mg/dL (ref 1.7–2.4)

## 2023-09-29 MED ORDER — CARVEDILOL 3.125 MG PO TABS
3.1250 mg | ORAL_TABLET | Freq: Two times a day (BID) | ORAL | Status: DC
  Administered 2023-09-29 – 2023-10-01 (×5): 3.125 mg via ORAL
  Filled 2023-09-29 (×5): qty 1

## 2023-09-29 MED ORDER — FUROSEMIDE 10 MG/ML IJ SOLN
80.0000 mg | Freq: Two times a day (BID) | INTRAMUSCULAR | Status: DC
  Administered 2023-09-29: 80 mg via INTRAVENOUS
  Administered 2023-09-30: 60 mg via INTRAVENOUS
  Filled 2023-09-29 (×2): qty 8

## 2023-09-29 NOTE — Progress Notes (Addendum)
 Patient Name: Lori Mclaughlin Date of Encounter: 09/29/2023 [facility] Cardiologist: Armanda Magic, MD   Interval Summary  .    Patient reports feeling much better than she did yesterday Reports good urine output with her IV Lasix  She does not feel short of breath any longer   Vital Signs .    Vitals:   09/28/23 1639 09/28/23 1917 09/28/23 2133 09/29/23 0559  BP: (!) 150/91 (!) 151/60 (!) 143/107 (!) 160/68  Pulse: (!) 50 (!) 55  60  Resp: 18 20  18   Temp: 97.7 F (36.5 C) (!) 97.4 F (36.3 C)  98 F (36.7 C)  TempSrc: Oral Oral  Oral  SpO2: 99% 100%  100%  Weight:    123.4 kg  Height:       Intake/Output Summary (Last 24 hours) at 09/29/2023 5409 Last data filed at 09/29/2023 0758 Gross per 24 hour  Intake 838 ml  Output 3500 ml  Net -2662 ml      09/29/2023    5:59 AM 09/28/2023    3:19 AM 09/27/2023    7:42 PM  Last 3 Weights  Weight (lbs) 272 lb 1.6 oz 278 lb 1.6 oz 281 lb 1.4 oz  Weight (kg) 123.424 kg 126.145 kg 127.5 kg     Telemetry/ECG    Sinus rhythm, HR 50-60s, PVCs - Personally Reviewed  Physical Exam .   GEN: No acute distress, on room air.   Neck: No JVD Cardiac: RRR, no murmurs, rubs, or gallops.  Respiratory: Clear to auscultation bilaterally. GI: Soft, nontender, non-distended  MS: 2+ pitting edema, bilateral LE  Assessment & Plan .     Lori Mclaughlin is a 64 y.o. female with asthma, chronic diastolic CHF, CKDIV, diabetes mellitus type 2 (uncontrolled), GERD, hypertension, microcytic anemia, morbid obesity, OSA with nocturnal hypoxemia, and osteoarthritis who presented as direct admission from clinic with acute on chronic HFpEF.     Acute on chronic CHF with newly found reduced EF  Patient reported worsening shortness of breath and swelling since October Patient was currently taking Lasix 20 mg twice daily, was still having significant swelling and shortness of breath BNP 1,677 Admission weight: 281 lb, down to 272 lb  today Creatinine 3.44 on admission, up to 3.85 today Updated echo showed: EF 35-40%, global hypokinesis, moderate LVH, G2DD, moderately reduced RV function, trivial MR, dilated IVC -- compared to last echo where EF was 60-65% (02/2021) Net - 4 L this admission, with 3.2 L of urine output yesterday  HR was in the 40-50s, start lower dose CoReg 3.125 mg BID today Continue hydralazine 100 mg TID Continue amlodipine 10 mg daily Hold losartan and SGLT2i at this time due to renal function  Lasix was not given this morning due to worsening renal function but she is still significantly volume overloaded on exam.  Diuresed with IV Lasix 80 mg today and follow-up renal function.  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  Agree with above documentation.  On exam, patient is alert and oriented, regular rate and rhythm, no murmurs, diminished breath sounds, 2+ lower extremity edema, + JVD.  Continues to be significantly volume overloaded on exam.  Had excellent diuresis yesterday but creatinine is increasing.  Will decrease IV Lasix to once daily today.  Continue to closely monitor renal function.  If worsening, will  likely need nephrology consult.  Lori Ishikawa, MD

## 2023-09-29 NOTE — Progress Notes (Signed)
 Mobility Specialist Progress Note;   09/29/23 0947  Mobility  Activity Ambulated with assistance in hallway;Transferred from bed to chair  Level of Assistance Contact guard assist, steadying assist  Assistive Device Cane  Distance Ambulated (ft) 80 ft  Activity Response Tolerated well  Mobility Referral Yes  Mobility visit 1 Mobility  Mobility Specialist Start Time (ACUTE ONLY) 0947  Mobility Specialist Stop Time (ACUTE ONLY) 0959  Mobility Specialist Time Calculation (min) (ACUTE ONLY) 12 min   Pt eager for mobility. Required MinG assistance during ambulation for safety. Took 1x standing rest break and 1x seated rest break in room d/t SOB. However, VSS throughout. Pt requested to sit in chair at EOS. Pt left in chair with all needs met, alarm on.   Lori Mclaughlin Mobility Specialist Please contact via SecureChat or [employer] [phone]

## 2023-09-29 NOTE — Plan of Care (Signed)
  Problem: Education: Goal: Knowledge of General Education information will improve Description: Including pain rating scale, medication(s)/side effects and non-pharmacologic comfort measures Outcome: Progressing   Problem: Health Behavior/Discharge Planning: Goal: Ability to manage health-related needs will improve Outcome: Progressing   Problem: Clinical Measurements: Goal: Ability to maintain clinical measurements within normal limits will improve Outcome: Progressing Goal: Diagnostic test results will improve Outcome: Progressing Goal: Respiratory complications will improve Outcome: Progressing Goal: Cardiovascular complication will be avoided Outcome: Progressing   Problem: Activity: Goal: Risk for activity intolerance will decrease Outcome: Progressing   Problem: Pain Managment: Goal: General experience of comfort will improve and/or be controlled Outcome: Progressing   Problem: Coping: Goal: Level of anxiety will decrease Outcome: Completed/Met

## 2023-09-29 NOTE — TOC Initial Note (Signed)
 Transition of Care [facility]) - Initial/Assessment Note    Patient Details  Name: Lori Mclaughlin MRN: [id number] Date of Birth: [birth year]  Transition of Care [facility]) CM/SW Contact:    Gala Lewandowsky, RN Phone Number: 09/29/2023, 3:36 PM  Clinical Narrative:  Patient presented for  shortness of breath. PTA patient was from home with support of children and grandchildren. Patient has DME cane and CPAP- via [employer] company to call Christoper Allegra if she has needs with the CPAP. Case Manager will will continue to follow for additional transition of care needs as the patient progresses.                 Expected Discharge Plan: Home/Self Care Barriers to Discharge: Continued Medical Work up  Patient Goals and CMS Choice Patient states their goals for this hospitalization and ongoing recovery are:: to return home once stable.   Choice offered to / list presented to : NA   Expected Discharge Plan and Services   Discharge Planning Services: CM Consult  DME Agency: NA  Prior Living Arrangements/Services   Lives with:: Adult Children Patient language and need for interpreter reviewed:: Yes Do you feel safe going back to the place where you live?: Yes      Need for Family Participation in Patient Care: No (Comment) Care giver support system in place?: No (comment) Current home services: DME (cane and CPAP) Criminal Activity/Legal Involvement Pertinent to Current Situation/Hospitalization: No - Comment as needed  Activities of Daily Living   ADL Screening (condition at time of admission) Independently performs ADLs?: Yes (appropriate for developmental age) Is the patient deaf or have difficulty hearing?: No Does the patient have difficulty seeing, even when wearing glasses/contacts?: Yes Does the patient have difficulty concentrating, remembering, or making decisions?: No  Permission Sought/Granted Permission sought to share information with : Family Supports, Case Manager      Emotional Assessment Appearance:: Appears stated age Attitude/Demeanor/Rapport: Engaged Affect (typically observed): Appropriate Orientation: : Oriented to Self, Oriented to Place, Oriented to  Time, Oriented to Situation Alcohol / Substance Use: Not Applicable Psych Involvement: No (comment)  Admission diagnosis:  CHF (congestive heart failure) (HCC) [I50.9] Patient Active Problem List   Diagnosis Date Noted   AKI (acute kidney injury) (HCC) 09/28/2023   CHF (congestive heart failure) (HCC) 09/27/2023   Liver lesion 07/29/2022   Elevated LFTs 07/29/2022   Dilation of biliary tract 07/29/2022   Right upper quadrant pain 06/04/2022   Diabetes (HCC) 05/10/2021   Hypertensive emergency 03/08/2021   Chronic kidney disease (CKD) stage G3b/A1, moderately decreased glomerular filtration rate (GFR) between 30-44 mL/min/1.73 square meter and albuminuria creatinine ratio less than 30 mg/g (HCC) 03/08/2021   Thiamine deficiency 09/19/2017   Myalgia 03/10/2017   Screen for colon cancer 06/12/2015   Visit for screening mammogram 06/12/2015   Hypersomnia 04/11/2014   Morbid obesity (HCC) 04/11/2014   Bradycardia, drug induced 04/11/2014   Chronic diastolic CHF (congestive heart failure), NYHA class 2 (HCC) 03/30/2014   Low back pain radiating to left leg 01/26/2012   Sleep apnea 11/13/2011   Hyperlipidemia with target LDL less than 70 01/14/2011   Deficiency anemia 08/05/2009   Essential hypertension 08/05/2009   ALLERGIC RHINITIS 08/05/2009   GERD 08/05/2009   OSTEOARTHRITIS 08/05/2009   PCP:  Olive Bass, FNP Pharmacy:   [facility]) - [place], [place] - [address] [facility] [address] [place] [address] [place]  [address] Phone: [phone] Fax: [phone]  [facility] DR[facility] STORE #  [id number] [place], [place] - [address] AT [facility] OF [facility] & [address] [place] [address] Phone: [phone] Fax:  [phone]  [facility] - [place], [place] - [address] [place] [address] Phone: [phone] Fax: [phone]  [facility] DRUG STORE #[id number] [place], [place] - [address] AT [facility] [address] [place] [address] Phone: [phone] Fax: [phone]  [facility] DRUG STORE #[id number] - [place],  - [address] AT [facility] OF [address] [place] [address] Phone: [phone] Fax: [phone]  [facility] [place] - [facility] [address] [place] [address] Phone: [phone] Fax: [phone]   Social Drivers of Health (SDOH) Social History: SDOH Screenings   Food Insecurity: No Food Insecurity (09/27/2023)  Housing: Low Risk  (09/27/2023)  Transportation Needs: Unmet Transportation Needs (09/27/2023)  Utilities: Not At Risk (09/27/2023)  Depression (PHQ2-9): Low Risk  (09/07/2023)  Financial Resource Strain: Low Risk  (09/06/2023)  Physical Activity: Unknown (09/06/2023)  Social Connections: Moderately Integrated (09/27/2023)  Recent Concern: Social Connections - Moderately Isolated (09/06/2023)  Stress: No Stress Concern Present (09/06/2023)  Tobacco Use: Medium Risk (09/28/2023)   Readmission Risk Interventions     No data to display

## 2023-09-29 NOTE — Telephone Encounter (Addendum)
 Pharmacy Patient Advocate Encounter  Insurance verification completed.    The patient is insured through [facility] MEDICAID.     Ran test claim for Bidil and requires prior authorization . Name brand is $4.00   This test claim was processed through [facility]- copay amounts may vary at other pharmacies due to pharmacy/plan contracts, or as the patient moves through the different stages of their insurance plan.

## 2023-09-30 ENCOUNTER — Inpatient Hospital Stay (HOSPITAL_COMMUNITY)

## 2023-09-30 ENCOUNTER — Encounter (HOSPITAL_COMMUNITY): Payer: Self-pay | Admitting: Cardiovascular Disease

## 2023-09-30 DIAGNOSIS — N179 Acute kidney failure, unspecified: Secondary | ICD-10-CM | POA: Diagnosis not present

## 2023-09-30 DIAGNOSIS — I130 Hypertensive heart and chronic kidney disease with heart failure and stage 1 through stage 4 chronic kidney disease, or unspecified chronic kidney disease: Secondary | ICD-10-CM | POA: Diagnosis not present

## 2023-09-30 DIAGNOSIS — I10 Essential (primary) hypertension: Secondary | ICD-10-CM | POA: Diagnosis not present

## 2023-09-30 DIAGNOSIS — D649 Anemia, unspecified: Secondary | ICD-10-CM | POA: Diagnosis not present

## 2023-09-30 DIAGNOSIS — N184 Chronic kidney disease, stage 4 (severe): Secondary | ICD-10-CM | POA: Diagnosis not present

## 2023-09-30 DIAGNOSIS — I5033 Acute on chronic diastolic (congestive) heart failure: Secondary | ICD-10-CM | POA: Diagnosis not present

## 2023-09-30 DIAGNOSIS — I5043 Acute on chronic combined systolic (congestive) and diastolic (congestive) heart failure: Secondary | ICD-10-CM | POA: Diagnosis not present

## 2023-09-30 DIAGNOSIS — R0602 Shortness of breath: Secondary | ICD-10-CM | POA: Diagnosis not present

## 2023-09-30 LAB — URINALYSIS, ROUTINE W REFLEX MICROSCOPIC
Bilirubin Urine: NEGATIVE
Glucose, UA: 150 mg/dL — AB
Hgb urine dipstick: NEGATIVE
Ketones, ur: NEGATIVE mg/dL
Nitrite: NEGATIVE
Protein, ur: 100 mg/dL — AB
Specific Gravity, Urine: 1.009 (ref 1.005–1.030)
pH: 6 (ref 5.0–8.0)

## 2023-09-30 LAB — GLUCOSE, CAPILLARY
Glucose-Capillary: 139 mg/dL — ABNORMAL HIGH (ref 70–99)
Glucose-Capillary: 181 mg/dL — ABNORMAL HIGH (ref 70–99)
Glucose-Capillary: 203 mg/dL — ABNORMAL HIGH (ref 70–99)
Glucose-Capillary: 164 mg/dL — ABNORMAL HIGH (ref 70–99)
Glucose-Capillary: 200 mg/dL — ABNORMAL HIGH (ref 70–99)

## 2023-09-30 LAB — BASIC METABOLIC PANEL
Anion gap: 6 (ref 5–15)
BUN: 59 mg/dL — ABNORMAL HIGH (ref 8–23)
CO2: 20 mmol/L — ABNORMAL LOW (ref 22–32)
Calcium: 8.2 mg/dL — ABNORMAL LOW (ref 8.9–10.3)
Chloride: 113 mmol/L — ABNORMAL HIGH (ref 98–111)
Creatinine, Ser: 3.84 mg/dL — ABNORMAL HIGH (ref 0.44–1.00)
GFR, Estimated: 13 mL/min — ABNORMAL LOW (ref >60)
Glucose, Bld: 135 mg/dL — ABNORMAL HIGH (ref 70–99)
Potassium: 4.4 mmol/L (ref 3.5–5.1)
Sodium: 139 mmol/L (ref 135–145)

## 2023-09-30 LAB — PROTEIN / CREATININE RATIO, URINE
Creatinine, Urine: 48 mg/dL
Protein Creatinine Ratio: 1.81 mg/mg{creat} — ABNORMAL HIGH (ref 0.00–0.15)
Total Protein, Urine: 87 mg/dL

## 2023-09-30 LAB — MAGNESIUM: Magnesium: 2.1 mg/dL (ref 1.7–2.4)

## 2023-09-30 MED ORDER — ISOSORBIDE MONONITRATE ER 30 MG PO TB24
30.0000 mg | ORAL_TABLET | Freq: Every day | ORAL | Status: DC
  Administered 2023-09-30 – 2023-10-01 (×2): 30 mg via ORAL
  Filled 2023-09-30 (×2): qty 1

## 2023-09-30 MED ORDER — FUROSEMIDE 10 MG/ML IJ SOLN
80.0000 mg | Freq: Three times a day (TID) | INTRAMUSCULAR | Status: DC
  Administered 2023-09-30 – 2023-10-01 (×3): 80 mg via INTRAVENOUS
  Filled 2023-09-30 (×3): qty 8

## 2023-09-30 NOTE — Progress Notes (Signed)
 Mobility Specialist Progress Note:    09/30/23 1428  Mobility  Activity Ambulated with assistance in hallway  Level of Assistance Contact guard assist, steadying assist  Assistive Device Cane  Distance Ambulated (ft) 100 ft  Activity Response Tolerated well  Mobility Referral Yes  Mobility visit 1 Mobility  Mobility Specialist Start Time (ACUTE ONLY) [id number]  Mobility Specialist Stop Time (ACUTE ONLY) 0941  Mobility Specialist Time Calculation (min) (ACUTE ONLY) 16 min   Pt received sitting EOB agreeable to mobility. No assistance needed during session. Toom x3 standing rest breaks d/t SOB and fatigue, VSS. Returned to room w/o fault. Left seated EOB w/ call bell and personal belongings in reach. All needs met.  Thompson Grayer Mobility Specialist  Please contact vis Secure Chat or  Rehab Office [phone]

## 2023-09-30 NOTE — Progress Notes (Signed)
   Heart Failure Stewardship Pharmacist Progress Note   PCP: Olive Bass, FNP PCP-Cardiologist: Armanda Magic, MD    HPI:  63 yo F with PMH of CHF, asthma, CKD IV, T2DM, GERD, HTN, anemia, obesity, OSA, and osteoarthritis.   Presented to [facility] cardiology office on 3/3. She was experiencing worsening shortness of breath, orthopnea, PND, and LE edema since October. Symptoms began after there was a lapse in her insurance coverage. Dry weight reported at 250 lbs. Admitted to the hospital for IV diuresis. BNP 1677.7. ECHO 3/4 showed LVEF 35-40% (was 60-65% in 02/2021), global hypokinesis, moderate concentric LVH, G2DD, RV moderately reduced, trivial MR, RA pressure 15. GDMT limited by AKI on CKD IV.   States that she is still feeling short of breath on exertion. Able to lay flat this morning for longer periods of time before becoming short of breath. Up in the chair eating currently.  Still with 2+ bilateral LE edema. States that she was switched from [facility] commercial insurance to [facility] plan when she retired and this was the issue she was experiencing last fall to be able to get her medications covered. After her new insurance became active and she switched pharmacies to [facility], she has not had any issues obtaining her medications.   Current HF Medications: Diuretic: furosemide 80 mg IV BID Beta Blocker: carvedilol 3.125 mg BID  Prior to admission HF Medications: Diuretic: furosemide 20 mg BID Beta blocker: carvedilol 6.25 mg BID ACE/ARB/ARNI: losartan 100 mg daily SGLT2i: Farxiga 10 mg daily  Pertinent Lab Values: Serum creatinine 3.84, BUN 59, Potassium 4.4, Sodium 139, BNP 1677.7, Magnesium 2.1, A1c 7.5   Vital Signs: Weight: 273 lbs (admission weight: 281 lbs) Blood pressure: 150/60s  Heart rate: 50s  I/O: net -2L yesterday; net -4.9L since admission  Medication Assistance / Insurance Benefits Check: Does the patient have prescription insurance?  Yes Type  of insurance plan: [place]  Outpatient Pharmacy:  Prior to admission outpatient pharmacy: [facility] [place] [facility] Is the patient willing to use [facility] TOC pharmacy at discharge? Yes    Assessment: 1. Acute on chronic systolic CHF (LVEF 35-40%). NYHA class III symptoms. - Agree with increasing back to furosemide 80 mg IV BID with stable creatinine from yesterday. Remains volume overloaded. Consider compression stockings vs [employer] boots. Strict I/Os and daily weights. Keep K>4 and Mg>2. - Continue carvedilol 3.125 mg BID - reduced dose from PTA with episodes of bradycardia - Holding PTA losartan and Farxiga with AKI on CKD IV - Continue hydralazine 100 mg TID + amlodipine 10 mg daily for hypertension. Consider starting Imdur 30 mg daily for hydral/nitrate benefit in AA with HFrEF   Plan: 1) Medication changes recommended at this time: - Agree with increased lasix today and monitor creatinine - Start Imdur 30 mg daily - May benefit from [facility] boots   2) Patient assistance: - Has [place] Medicaid  3)  Education  - Initial education completed - Full education to be completed prior to discharge  Sharen Hones, PharmD, BCPS Heart Failure Engineer, building services Phone [phone]

## 2023-09-30 NOTE — Progress Notes (Signed)
 Heart Failure Nurse Navigator Progress Note  PCP: Olive Bass, FNP PCP-Cardiologist: Mayford Knife Admission Diagnosis: Acute on chronic HFpEF Admitted from: Direct Admit from Cardiology office  Presentation:   Lori Mclaughlin presented from her cardiologist office with worsening shortness of breath and bilateral leg edema up to her thighs since October.  BP 187/68, HR 64, BNP 1,677, BMI 51.56. A1c 9.9, Creatinine 3.85.   Patient was educated on the sign and symptoms of heart failure, daily weights, when to call her doctor or go to the ED, Diet/ fluid restrictions, taking all her medications as prescribed and attending all medical appointments. Patient verbalized her understanding of education. A HF TOC appointment was scheduled for 10/11/2023 @ 9:30 am.   ECHO/ LVEF: 35-40% Clinical Course:  Past Medical History:  Diagnosis Date   Allergic rhinitis    Asthma    Atypical chest pain    a. 03/2014: normal nuclear stress test.   Chronic diastolic CHF (congestive heart failure) (HCC)    a. Dx 03/2014 with echo - moderate LVH, EF 55-60%, grade 1 diastolic dysfunction, mildly dilated LA.   Chronic diastolic heart failure (HCC) 03/07/2021   CKD (chronic kidney disease), stage II    Diabetes mellitus type II    GERD (gastroesophageal reflux disease)    Headache(784.0)    when my bp is up   Hypertension    Hypertensive heart disease    Microcytic anemia    OSA (obstructive sleep apnea)    severe with AHI 77/hr with nocturnal hypoxemia   Osteoarthritis    Right upper quadrant pain 06/04/2022     Social History   Socioeconomic History   Marital status: Divorced    Spouse name: Not on file   Number of children: Not on file   Years of education: Not on file   Highest education level: Some college, no degree  Occupational History   Not on file  Tobacco Use   Smoking status: Former    Current packs/day: 0.00    Types: Cigarettes    Quit date: 08/29/2010    Years since  quitting: 13.0   Smokeless tobacco: Never  Vaping Use   Vaping status: Never Used  Substance and Sexual Activity   Alcohol use: No    Alcohol/week: 5.0 standard drinks of alcohol    Types: 5 Glasses of wine per week   Drug use: No   Sexual activity: Yes    Birth control/protection: Surgical  Other Topics Concern   Not on file  Social History Narrative   Regular Exercise- yes   Social Drivers of Health   Financial Resource Strain: Low Risk  (09/06/2023)   Overall Financial Resource Strain (CARDIA)    Difficulty of Paying Living Expenses: Not hard at all  Food Insecurity: No Food Insecurity (09/27/2023)   Hunger Vital Sign    Worried About Running Out of Food in the Last Year: Never true    Ran Out of Food in the Last Year: Never true  Transportation Needs: Unmet Transportation Needs (09/27/2023)   PRAPARE - Administrator, Civil Service (Medical): Yes    Lack of Transportation (Non-Medical): No  Physical Activity: Unknown (09/06/2023)   Exercise Vital Sign    Days of Exercise per Week: 0 days    Minutes of Exercise per Session: Not on file  Stress: No Stress Concern Present (09/06/2023)   [employer] of Occupational Health - Occupational Stress Questionnaire    Feeling of Stress : Not at all  Social Connections: Moderately Integrated (09/27/2023)   Social Connection and Isolation Panel [NHANES]    Frequency of Communication with Friends and Family: More than three times a week    Frequency of Social Gatherings with Friends and Family: Twice a week    Attends Religious Services: More than 4 times per year    Active Member of [employer] or Organizations: Yes    Attends Banker Meetings: Not on file    Marital Status: Divorced  Recent Concern: Social Connections - Moderately Isolated (09/06/2023)   Social Connection and Isolation Panel [NHANES]    Frequency of Communication with Friends and Family: More than three times a week    Frequency of Social  Gatherings with Friends and Family: Once a week    Attends Religious Services: More than 4 times per year    Active Member of [employer] or Organizations: No    Attends Engineer, structural: Not on file    Marital Status: Divorced   Education Assessment and Provision:  Detailed education and instructions provided on heart failure disease management including the following:  Signs and symptoms of Heart Failure When to call the physician Importance of daily weights Low sodium diet Fluid restriction Medication management Anticipated future follow-up appointments  Patient education given on each of the above topics.  Patient acknowledges understanding via teach back method and acceptance of all instructions.  Education Materials:  "Living Better With Heart Failure" Booklet, HF zone tool, & Daily Weight Tracker Tool.  Patient has scale at home: yes Patient has pill box at home: yes    High Risk Criteria for Readmission and/or Poor Patient Outcomes: Heart failure hospital admissions (last 6 months): 2  No Show rate: 14 % Difficult social situation: No, lives with her daughter and grandchildren.  Demonstrates medication adherence: yes Primary Language: English Literacy level: Reading, writing, and comprehension  Barriers of Care:   Diet/ fluid restrictions Daily weights  Creatinine clearance  Considerations/Referrals:   Referral made to Heart Failure Pharmacist Stewardship: Yes Referral made to Heart Failure CSW/NCM TOC: No Referral made to Heart & Vascular TOC clinic: Yes, 10/11/2023 @ 9:30 am.   Items for Follow-up on DC/TOC: Continued HF education Diet/ fluid restrictions Daily weights   Rhae Hammock, BSN, RN Heart Failure Teacher, adult education Only

## 2023-09-30 NOTE — Progress Notes (Addendum)
 Patient Name: Lori Mclaughlin Date of Encounter: 09/30/2023 [facility] Cardiologist: Armanda Magic, MD   Interval Summary  .    BP 159/68.  -1.4 L yesterday, -4.9 L on admission.  Creatinine stable today (3.85 > 3.84).  She reports dyspnea improving  Vital Signs .    Vitals:   09/29/23 2005 09/30/23 0412 09/30/23 0740 09/30/23 0900  BP: (!) 162/56 (!) 152/59 (!) 152/63 (!) 159/68  Pulse: 65 (!) 57 (!) 56   Resp: 16 20 20    Temp: 97.6 F (36.4 C) 98.1 F (36.7 C) 97.9 F (36.6 C)   TempSrc: Oral Oral Oral   SpO2: 96% 99% 98%   Weight:  124.1 kg    Height:       Intake/Output Summary (Last 24 hours) at 09/30/2023 1004 Last data filed at 09/30/2023 0419 Gross per 24 hour  Intake 600 ml  Output 1450 ml  Net -850 ml      09/30/2023    4:12 AM 09/29/2023    5:59 AM 09/28/2023    3:19 AM  Last 3 Weights  Weight (lbs) 273 lb 8 oz 272 lb 1.6 oz 278 lb 1.6 oz  Weight (kg) 124.059 kg 123.424 kg 126.145 kg     Telemetry/ECG    Sinus bradycardia, HR 56 with PVCs - Personally Reviewed  Physical Exam .   GEN: No acute distress, on room air.   Neck: + JVD Cardiac: bradycardic, no murmurs, rubs, or gallops.  Respiratory: diminished at bases. GI: Soft, nontender, non-distended  MS: 2+ pitting bilateral LE edema  Assessment & Plan .   Lori Mclaughlin is a 64 y.o. female with asthma, chronic diastolic CHF, CKDIV, diabetes mellitus type 2 (uncontrolled), GERD, hypertension, microcytic anemia, morbid obesity, OSA with nocturnal hypoxemia, and osteoarthritis who presented as direct admission from clinic with acute on chronic HFpEF.   Acute on chronic CHF with newly found reduced EF  Patient reported worsening shortness of breath and swelling since October Patient was currently taking Lasix 20 mg twice daily, was still having significant swelling and shortness of breath BNP 1,677 Admission weight: 281 lb, down to 273 lb today -- believes dry weight to be ~250 lb,  was noted to be euvolemic at 265 lb at office visit 05/2022 Creatinine 3.44 on admission, up to 3.85, 3.84 today Updated echo showed: EF 35-40%, global hypokinesis, moderate LVH, G2DD, moderately reduced RV function, trivial MR, dilated IVC -- compared to last echo where EF was 60-65% (02/2021) Net - 4.9 L this admission Continue CoReg 3.125 mg BID  Continue hydralazine 100 mg TID Continue amlodipine 10 mg daily Start Imdur 30 mg daily Hold losartan and SGLT2i at this time due to renal function  Will continue IV Lasix 80 mg BID  Unclear cause of systolic dysfunction.  Not a cath candidate given renal function, could consider stress pet as outpatient   Hypertension Most recent BP 152/63 HR was in the 40-50s Continue CoReg 3.125 mg BID  Continue hydralazine 100 mg TID Continue amlodipine 10 mg daily Hold losartan and SGLT2i at this time due to renal function    Acute on chronic CKD stage IV Creatinine 3.84 today from 3.85 yesterday Holding losartan and SGL2Ti at this time Will monitor with diuresis Consult nephrology given tenuous renal function   Hyperlipidemia, goal LDL < 70 09/27/2023: ALT 25 08/01/1094: HDL 46; LDL Cholesterol 34  Continue Lipitor 20 mg daily   Diabetes  Hemoglobin A1c 7.5% Consulted diabetes coordinator who  is following patient while admitted   For questions or updates, please contact [facility] Please consult [email or web address] for contact info under        Signed, Olena Leatherwood, PA-C    Patient seen and examined.  Agree with above documentation.  On exam, patient is alert and oriented, regular rate and rhythm, no murmurs, lungs CTAB, 2+ lower extremity edema, + JVD.  She remains significantly volume overloaded.  Will continue IV Lasix.  Will consult nephrology given tenuous renal function.  Little Ishikawa, MD

## 2023-09-30 NOTE — Plan of Care (Signed)
  Problem: Education: Goal: Knowledge of General Education information will improve Description: Including pain rating scale, medication(s)/side effects and non-pharmacologic comfort measures Outcome: Progressing   Problem: Health Behavior/Discharge Planning: Goal: Ability to manage health-related needs will improve Outcome: Progressing   Problem: Clinical Measurements: Goal: Ability to maintain clinical measurements within normal limits will improve Outcome: Progressing Goal: Will remain free from infection Outcome: Progressing Goal: Diagnostic test results will improve Outcome: Progressing Goal: Respiratory complications will improve Outcome: Progressing Goal: Cardiovascular complication will be avoided Outcome: Progressing   Problem: Activity: Goal: Risk for activity intolerance will decrease Outcome: Progressing   Problem: Elimination: Goal: Will not experience complications related to bowel motility Outcome: Progressing   Problem: Pain Managment: Goal: General experience of comfort will improve and/or be controlled Outcome: Progressing   Problem: Safety: Goal: Ability to remain free from injury will improve Outcome: Progressing   Problem: Skin Integrity: Goal: Risk for impaired skin integrity will decrease Outcome: Progressing   Problem: Education: Goal: Ability to describe self-care measures that may prevent or decrease complications (Diabetes Survival Skills Education) will improve Outcome: Progressing   Problem: Coping: Goal: Ability to adjust to condition or change in health will improve Outcome: Progressing   Problem: Fluid Volume: Goal: Ability to maintain a balanced intake and output will improve Outcome: Progressing   Problem: Health Behavior/Discharge Planning: Goal: Ability to identify and utilize available resources and services will improve Outcome: Progressing Goal: Ability to manage health-related needs will improve Outcome: Progressing    Problem: Metabolic: Goal: Ability to maintain appropriate glucose levels will improve Outcome: Progressing   Problem: Nutritional: Goal: Maintenance of adequate nutrition will improve Outcome: Progressing Goal: Progress toward achieving an optimal weight will improve Outcome: Progressing   Problem: Skin Integrity: Goal: Risk for impaired skin integrity will decrease Outcome: Progressing   Problem: Tissue Perfusion: Goal: Adequacy of tissue perfusion will improve Outcome: Progressing   Problem: Education: Goal: Ability to demonstrate management of disease process will improve Outcome: Progressing Goal: Ability to verbalize understanding of medication therapies will improve Outcome: Progressing   Problem: Activity: Goal: Capacity to carry out activities will improve Outcome: Progressing   Problem: Cardiac: Goal: Ability to achieve and maintain adequate cardiopulmonary perfusion will improve Outcome: Progressing

## 2023-09-30 NOTE — Progress Notes (Signed)
   09/30/23 2030  BiPAP/CPAP/SIPAP  $ Non-Invasive Home Ventilator  Subsequent  BiPAP/CPAP/SIPAP Pt Type Adult  BiPAP/CPAP/SIPAP Resmed (pt home unit, pt is able to place self on. RT instructed if pt needs further help to call.)  Mask Type Full face mask  Dentures removed? Not applicable  Patient Home Equipment Yes  Safety Check Completed by RT for Home Unit Yes, no issues noted

## 2023-09-30 NOTE — Consult Note (Signed)
 [facility]  HISTORY AND PHYSICAL  Lori Mclaughlin is an 63 y.o. female.    Chief Complaint: SOB  HPI: PT is a 13F with a PMH sig for HTN, HLD, DM II, chronic diastolic CHF, and CKD Stage IV who is sene in consultation at the request of Dr Duke Salvia for eval and management of presumed AKI on CKD.    Followed by Dr Malen Gauze, hasn't been seen in over a year.  Last labs I can see here in Epic with Cr 3.5 07/2023.    Came in on 3/3 with progressively worsening SOB, was seen in cardiology clinic and was direct admitted for IV diuresis.  Has been getting IV Lasix 80 IV BID.  ARB and SGLT2i held.  Cr drifted up to 3.84 and in this setting we are asked to see.  Pt reports trying to do a low salt diet.  Does not adhere to fluid restriction.  TTE 09/28/23 with EF 40-45% and G2DD.    Still has sig LE edema.  No metallic taste but does report bloating after meals.  Strong FH of ESRD and transplant.  Has not had APOL-1 testing to her knowledge.   PMH: Past Medical History:  Diagnosis Date   Allergic rhinitis    Asthma    Atypical chest pain    a. 03/2014: normal nuclear stress test.   Chronic diastolic CHF (congestive heart failure) (HCC)    a. Dx 03/2014 with echo - moderate LVH, EF 55-60%, grade 1 diastolic dysfunction, mildly dilated LA.   Chronic diastolic heart failure (HCC) 03/07/2021   CKD (chronic kidney disease), stage II    Diabetes mellitus type II    GERD (gastroesophageal reflux disease)    Headache(784.0)    when my bp is up   Hypertension    Hypertensive heart disease    Microcytic anemia    OSA (obstructive sleep apnea)    severe with AHI 77/hr with nocturnal hypoxemia   Osteoarthritis    Right upper quadrant pain 06/04/2022   PSH: Past Surgical History:  Procedure Laterality Date   SHOULDER ARTHROSCOPY Bilateral    TUBAL LIGATION       Past Medical History:  Diagnosis Date   Allergic rhinitis    Asthma    Atypical chest pain    a. 03/2014:  normal nuclear stress test.   Chronic diastolic CHF (congestive heart failure) (HCC)    a. Dx 03/2014 with echo - moderate LVH, EF 55-60%, grade 1 diastolic dysfunction, mildly dilated LA.   Chronic diastolic heart failure (HCC) 03/07/2021   CKD (chronic kidney disease), stage II    Diabetes mellitus type II    GERD (gastroesophageal reflux disease)    Headache(784.0)    when my bp is up   Hypertension    Hypertensive heart disease    Microcytic anemia    OSA (obstructive sleep apnea)    severe with AHI 77/hr with nocturnal hypoxemia   Osteoarthritis    Right upper quadrant pain 06/04/2022    Medications:  Scheduled:  amLODipine  10 mg Oral Daily   atorvastatin  20 mg Oral Daily   carvedilol  3.125 mg Oral BID WC   furosemide  80 mg Intravenous BID   heparin  5,000 Units Subcutaneous Q8H   hydrALAZINE  100 mg Oral TID   insulin aspart  0-15 Units Subcutaneous TID WC   isosorbide mononitrate  30 mg Oral Daily   sodium chloride flush  3 mL Intravenous Q12H  Medications Prior to Admission  Medication Sig Dispense Refill   amLODipine (NORVASC) 10 MG tablet Take 1 tablet (10 mg total) by mouth daily. 90 tablet 3   atorvastatin (LIPITOR) 20 MG tablet Take 1 tablet (20 mg total) by mouth daily. *Need appointment for further refills, call [phone].* 30 tablet 0   carvedilol (COREG) 6.25 MG tablet TAKE 1 TABLET BY MOUTH TWICE  DAILY WITH MEALS 180 tablet 3   Continuous Glucose Sensor (DEXCOM G7 SENSOR) MISC 1 Device by Does not apply route as directed. 9 each 3   dapagliflozin propanediol (FARXIGA) 10 MG TABS tablet Take 1 tablet (10 mg total) by mouth daily. 90 tablet 2   fluticasone (FLONASE) 50 MCG/ACT nasal spray Place 2 sprays into both nostrils daily. (Patient taking differently: Place 2 sprays into both nostrils daily as needed.) 16 g 6   furosemide (LASIX) 20 MG tablet Take 1 tablet (20 mg total) by mouth 2 (two) times daily. For the first 5 days please take 40 mg in the  morning and 20 mg at night, then you can return to your normal twice daily dosing 90 tablet 2   hydrALAZINE (APRESOLINE) 100 MG tablet TAKE 1 TABLET BY MOUTH 3 TIMES  DAILY 270 tablet 1   insulin aspart (NOVOLOG FLEXPEN) 100 UNIT/ML FlexPen Inject 45 Units into the skin daily. 45 mL 3   insulin glargine (LANTUS SOLOSTAR) 100 UNIT/ML Solostar Pen Inject 50 Units into the skin daily. In the afternoon 15 mL 1   losartan (COZAAR) 100 MG tablet Take 100 mg by mouth daily.     allopurinol (ZYLOPRIM) 300 MG tablet TAKE 1 TABLET BY MOUTH DAILY (Patient not taking: Reported on 09/07/2023) 90 tablet 3   blood glucose meter kit and supplies Dispense based on patient and insurance preference. Use up to four times daily as directed. (FOR ICD-10 E10.9, E11.9). 1 each 0   Insulin Glargine Max SoloStar 300 UNIT/ML SOPN Inject 50 Units into the skin daily. (Patient not taking: Reported on 09/27/2023)     Insulin Pen Needle 32G X 4 MM MISC 1 Device by Does not apply route in the morning, at noon, in the evening, and at bedtime. 400 each 2    ALLERGIES:   Allergies  Allergen Reactions   Latex Itching and Rash   Lisinopril Other (See Comments)    Headache    FAM HX: Family History  Problem Relation Age of Onset   Heart attack Mother    Hypertension Mother    Cancer Father    Hypertension Brother    Hypertension Maternal Grandmother    Hypertension Maternal Grandfather    Diabetes Other    Hypertension Other    Stroke Other    Cancer Other        colon; prostate   Kidney disease Neg Hx    Alcohol abuse Neg Hx    Asthma Neg Hx    COPD Neg Hx    Depression Neg Hx    Drug abuse Neg Hx    Early death Neg Hx    Hearing loss Neg Hx    Heart disease Neg Hx    Hyperlipidemia Neg Hx     Social History:   reports that she quit smoking about 13 years ago. Her smoking use included cigarettes. She has never used smokeless tobacco. She reports that she does not drink alcohol and does not use  drugs.  ROS: ROS: all other systems reviewed and are negative except as per HPI  Blood pressure (!) 159/68, pulse (!) 56, temperature 97.9 F (36.6 C), temperature source Oral, resp. rate 20, height 5' (1.524 m), weight 124.1 kg, last menstrual period 02/23/2012, SpO2 98%. PHYSICAL EXAM: Physical Exam GEN nad, sitting up in bed HEENT EOMI PERRL NECK no overt JVD PULM muffled at bases CV RRR ABD +some abd wall bloating EXT 2+ LE edema NEURO AAO x 3 no asterixis   Results for orders placed or performed during the hospital encounter of 09/27/23 (from the past 48 hours)  Glucose, capillary     Status: Abnormal   Collection Time: 09/28/23  4:36 PM  Result Value Ref Range   Glucose-Capillary 128 (H) 70 - 99 mg/dL    Comment: Glucose reference range applies only to samples taken after fasting for at least 8 hours.  Glucose, capillary     Status: Abnormal   Collection Time: 09/28/23  9:27 PM  Result Value Ref Range   Glucose-Capillary 148 (H) 70 - 99 mg/dL    Comment: Glucose reference range applies only to samples taken after fasting for at least 8 hours.  Lipid panel     Status: None   Collection Time: 09/29/23  4:53 AM  Result Value Ref Range   Cholesterol 89 0 - 200 mg/dL   Triglycerides 46 <644 mg/dL   HDL 46 >03 mg/dL   Total CHOL/HDL Ratio 1.9 RATIO   VLDL 9 0 - 40 mg/dL   LDL Cholesterol 34 0 - 99 mg/dL    Comment:        Total Cholesterol/HDL:CHD Risk Coronary Heart Disease Risk Table                     Men   Women  1/2 Average Risk   3.4   3.3  Average Risk       5.0   4.4  2 X Average Risk   9.6   7.1  3 X Average Risk  23.4   11.0        Use the calculated Patient Ratio above and the CHD Risk Table to determine the patient's CHD Risk.        ATP III CLASSIFICATION (LDL):  <100     mg/dL   Optimal  474-259  mg/dL   Near or Above                    Optimal  130-159  mg/dL   Borderline  563-875  mg/dL   High  >643     mg/dL   Very High Performed at [facility], [address] [place], [place] [address]   Basic metabolic panel     Status: Abnormal   Collection Time: 09/29/23  4:53 AM  Result Value Ref Range   Sodium 142 135 - 145 mmol/L   Potassium 4.7 3.5 - 5.1 mmol/L   Chloride 111 98 - 111 mmol/L   CO2 20 (L) 22 - 32 mmol/L   Glucose, Bld 115 (H) 70 - 99 mg/dL    Comment: Glucose reference range applies only to samples taken after fasting for at least 8 hours.   BUN 56 (H) 8 - 23 mg/dL   Creatinine, Ser 8.84 (H) 0.44 - 1.00 mg/dL   Calcium 8.7 (L) 8.9 - 10.3 mg/dL   GFR, Estimated 12 (L) >60 mL/min    Comment: (NOTE) Calculated using the CKD-EPI Creatinine Equation (2021)    Anion gap 11 5 - 15  Comment: Performed at [facility], [address] [place], [place] [address]  Magnesium     Status: None   Collection Time: 09/29/23  4:53 AM  Result Value Ref Range   Magnesium 2.1 1.7 - 2.4 mg/dL    Comment: Performed at [facility], [address], [place], [place] [address]  Glucose, capillary     Status: Abnormal   Collection Time: 09/29/23  7:57 AM  Result Value Ref Range   Glucose-Capillary 128 (H) 70 - 99 mg/dL    Comment: Glucose reference range applies only to samples taken after fasting for at least 8 hours.  CBC     Status: Abnormal   Collection Time: 09/29/23  8:05 AM  Result Value Ref Range   WBC 5.9 4.0 - 10.5 K/uL   RBC 3.69 (L) 3.87 - 5.11 MIL/uL   Hemoglobin 7.9 (L) 12.0 - 15.0 g/dL    Comment: Reticulocyte Hemoglobin testing may be clinically indicated, consider ordering this additional test [id number]    HCT 25.8 (L) 36.0 - 46.0 %   MCV 69.9 (L) 80.0 - 100.0 fL   MCH 21.4 (L) 26.0 - 34.0 pg   MCHC 30.6 30.0 - 36.0 g/dL   RDW 08.6 (H) 57.8 - 46.9 %   Platelets 183 150 - 400 K/uL    Comment: REPEATED TO VERIFY   nRBC 0.0 0.0 - 0.2 %    Comment: Performed at [facility], [address] [place], [place] [address]  Glucose, capillary     Status: Abnormal   Collection Time:  09/29/23 12:00 PM  Result Value Ref Range   Glucose-Capillary 154 (H) 70 - 99 mg/dL    Comment: Glucose reference range applies only to samples taken after fasting for at least 8 hours.  Glucose, capillary     Status: Abnormal   Collection Time: 09/29/23  4:36 PM  Result Value Ref Range   Glucose-Capillary 208 (H) 70 - 99 mg/dL    Comment: Glucose reference range applies only to samples taken after fasting for at least 8 hours.  Glucose, capillary     Status: Abnormal   Collection Time: 09/29/23  9:27 PM  Result Value Ref Range   Glucose-Capillary 196 (H) 70 - 99 mg/dL    Comment: Glucose reference range applies only to samples taken after fasting for at least 8 hours.  Basic metabolic panel     Status: Abnormal   Collection Time: 09/30/23  4:55 AM  Result Value Ref Range   Sodium 139 135 - 145 mmol/L   Potassium 4.4 3.5 - 5.1 mmol/L   Chloride 113 (H) 98 - 111 mmol/L   CO2 20 (L) 22 - 32 mmol/L   Glucose, Bld 135 (H) 70 - 99 mg/dL    Comment: Glucose reference range applies only to samples taken after fasting for at least 8 hours.   BUN 59 (H) 8 - 23 mg/dL   Creatinine, Ser 8.41 (H) 0.44 - 1.00 mg/dL   Calcium 8.2 (L) 8.9 - 10.3 mg/dL   GFR, Estimated 13 (L) >60 mL/min    Comment: (NOTE) Calculated using the CKD-EPI Creatinine Equation (2021)    Anion gap 6 5 - 15    Comment: Performed at [facility], [address], [place], [place] [address]  Magnesium     Status: None   Collection Time: 09/30/23  4:55 AM  Result Value Ref Range   Magnesium 2.1 1.7 - 2.4 mg/dL  Comment: Performed at [facility], [address] [place], [place] [address]  Glucose, capillary     Status: Abnormal   Collection Time: 09/30/23  8:32 AM  Result Value Ref Range   Glucose-Capillary 139 (H) 70 - 99 mg/dL    Comment: Glucose reference range applies only to samples taken after fasting for at least 8 hours.  Glucose, capillary     Status: Abnormal   Collection Time: 09/30/23 11:40 AM   Result Value Ref Range   Glucose-Capillary 181 (H) 70 - 99 mg/dL    Comment: Glucose reference range applies only to samples taken after fasting for at least 8 hours.    No results found.  Assessment/Plan  AKI on CKD IV: longstanding CKD.  Baseline appears to be 3.5.  - now with volume overload in setting of CHF exac  - no uremic symptoms  - possible cardiorenal syndrome  - has sig volume to go  - increase Lasix to 80 IV TID and follow closely  - add in strict I/O, 1950 UOP yesterday  - hopefully can get by without HD  - will need to be plugged back in with CKA on discharge  - will send Ua and UP/C at the least, renal US  2.  Acute on chronic systolic and diastolic CHF exac:   - 2 g Na and 2L fluid restriction  - diuresis as above  - ARB and SGLT2i on hold, agree  - cardiology following  3.  DM II  - per primary  4.  Obesity:  - couldn't tolerate ozempic- nausea  5.  OSA  - CPAP at night  6.  Dispo: pending Bufford Buttner 09/30/2023, 2:57 PM

## 2023-09-30 NOTE — Progress Notes (Signed)
 Patient's IV quit working during administration of IV LASIX. Patient received 60 MG.  IV team consult placed.  Notified Evlyn Clines PA, she stated given kidney function, ok to receive the 60 mg (No new order received for additional 20mg  to make the total of 80mg   of Lasix).

## 2023-09-30 NOTE — Plan of Care (Signed)
   Problem: Education: Goal: Knowledge of General Education information will improve Description Including pain rating scale, medication(s)/side effects and non-pharmacologic comfort measures Outcome: Progressing

## 2023-09-30 NOTE — Progress Notes (Signed)
 Order for [employer] received.  Lori Mclaughlin with Ortho Tech Notified.

## 2023-10-01 DIAGNOSIS — I10 Essential (primary) hypertension: Secondary | ICD-10-CM | POA: Diagnosis not present

## 2023-10-01 DIAGNOSIS — R0602 Shortness of breath: Secondary | ICD-10-CM | POA: Diagnosis not present

## 2023-10-01 DIAGNOSIS — N179 Acute kidney failure, unspecified: Secondary | ICD-10-CM | POA: Diagnosis not present

## 2023-10-01 DIAGNOSIS — I5043 Acute on chronic combined systolic (congestive) and diastolic (congestive) heart failure: Secondary | ICD-10-CM | POA: Diagnosis not present

## 2023-10-01 DIAGNOSIS — N184 Chronic kidney disease, stage 4 (severe): Secondary | ICD-10-CM | POA: Diagnosis not present

## 2023-10-01 DIAGNOSIS — I5033 Acute on chronic diastolic (congestive) heart failure: Secondary | ICD-10-CM | POA: Diagnosis not present

## 2023-10-01 DIAGNOSIS — D649 Anemia, unspecified: Secondary | ICD-10-CM | POA: Diagnosis not present

## 2023-10-01 DIAGNOSIS — I130 Hypertensive heart and chronic kidney disease with heart failure and stage 1 through stage 4 chronic kidney disease, or unspecified chronic kidney disease: Secondary | ICD-10-CM | POA: Diagnosis not present

## 2023-10-01 LAB — BASIC METABOLIC PANEL
Anion gap: 7 (ref 5–15)
BUN: 64 mg/dL — ABNORMAL HIGH (ref 8–23)
CO2: 21 mmol/L — ABNORMAL LOW (ref 22–32)
Calcium: 8.1 mg/dL — ABNORMAL LOW (ref 8.9–10.3)
Chloride: 109 mmol/L (ref 98–111)
Creatinine, Ser: 4.17 mg/dL — ABNORMAL HIGH (ref 0.44–1.00)
GFR, Estimated: 11 mL/min — ABNORMAL LOW (ref >60)
Glucose, Bld: 168 mg/dL — ABNORMAL HIGH (ref 70–99)
Potassium: 4.3 mmol/L (ref 3.5–5.1)
Sodium: 137 mmol/L (ref 135–145)

## 2023-10-01 LAB — GLUCOSE, CAPILLARY
Glucose-Capillary: 161 mg/dL — ABNORMAL HIGH (ref 70–99)
Glucose-Capillary: 157 mg/dL — ABNORMAL HIGH (ref 70–99)
Glucose-Capillary: 132 mg/dL — ABNORMAL HIGH (ref 70–99)
Glucose-Capillary: 176 mg/dL — ABNORMAL HIGH (ref 70–99)

## 2023-10-01 LAB — MAGNESIUM: Magnesium: 2 mg/dL (ref 1.7–2.4)

## 2023-10-01 MED ORDER — FUROSEMIDE 10 MG/ML IJ SOLN
120.0000 mg | Freq: Three times a day (TID) | INTRAVENOUS | Status: DC
  Administered 2023-10-01 – 2023-10-02 (×5): 120 mg via INTRAVENOUS
  Filled 2023-10-01 (×2): qty 10
  Filled 2023-10-01: qty 2
  Filled 2023-10-01: qty 10
  Filled 2023-10-01 (×3): qty 12
  Filled 2023-10-01: qty 120

## 2023-10-01 MED ORDER — FERROUS SULFATE 325 (65 FE) MG PO TABS
325.0000 mg | ORAL_TABLET | Freq: Every day | ORAL | Status: DC
  Administered 2023-10-02 – 2023-10-05 (×4): 325 mg via ORAL
  Filled 2023-10-01 (×4): qty 1

## 2023-10-01 MED ORDER — ISOSORBIDE MONONITRATE ER 60 MG PO TB24
60.0000 mg | ORAL_TABLET | Freq: Every day | ORAL | Status: DC
Start: 2023-10-02 — End: 2023-10-05
  Administered 2023-10-02 – 2023-10-04 (×3): 60 mg via ORAL
  Filled 2023-10-01 (×3): qty 1

## 2023-10-01 MED ORDER — GUAIFENESIN-DM 100-10 MG/5ML PO SYRP
15.0000 mL | ORAL_SOLUTION | ORAL | Status: DC | PRN
  Administered 2023-10-01 – 2023-10-05 (×5): 15 mL via ORAL
  Filled 2023-10-01 (×6): qty 15

## 2023-10-01 NOTE — Progress Notes (Signed)
 Nutrition Education Note  Received consult for education regarding low sodium diet.  "Low Sodium Nutrition Therapy" handout attached to discharge instructions.  Currently on a heart healthy diet with 2 L fluid restriction. Patient is consuming 60-70% of meals at this time. Labs reviewed. No further nutrition interventions indicated at this time.  Gabriel Rainwater RD, LDN, CNSC Contact via secure chat. If unavailable, use group chat "RD Inpatient."

## 2023-10-01 NOTE — Progress Notes (Signed)
   Heart Failure Stewardship Pharmacist Progress Note   PCP: Olive Bass, FNP PCP-Cardiologist: Armanda Magic, MD    HPI:  63 yo F with PMH of CHF, asthma, CKD IV, T2DM, GERD, HTN, anemia, obesity, OSA, and osteoarthritis.   Presented to [facility] cardiology office on 3/3. She was experiencing worsening shortness of breath, orthopnea, PND, and LE edema since October. Symptoms began after there was a lapse in her insurance coverage. Dry weight reported at 250 lbs. Admitted to the hospital for IV diuresis. BNP 1677.7. ECHO 3/4 showed LVEF 35-40% (was 60-65% in 02/2021), global hypokinesis, moderate concentric LVH, G2DD, RV moderately reduced, trivial MR, RA pressure 15. GDMT limited by AKI on CKD IV. Nephrology consulted.   States that she was switched from [facility] commercial insurance to [facility] plan when she retired and this was the issue she was experiencing last fall to be able to get her medications covered. After her new insurance became active and she switched pharmacies to [facility], she has not had any issues obtaining her medications.   States that she is still feeling short of breath, about the same as yesterday. Still has 2+ pitting edema. Minimally improved from yesterday. UNNA boots ordered but have not been able to be placed yet. Reports she had a coughing spell last night from fluid and was not able to go back to sleep, feeling tired today.   Current HF Medications: Diuretic: furosemide 80 mg IV TID Beta Blocker: carvedilol 3.125 mg BID Other: hydralazine 100 mg TID + Imdur 30 mg daily  Prior to admission HF Medications: Diuretic: furosemide 20 mg BID Beta blocker: carvedilol 6.25 mg BID ACE/ARB/ARNI: losartan 100 mg daily SGLT2i: Farxiga 10 mg daily Other: hydralazine 100 mg TID  Pertinent Lab Values: Serum creatinine 3.84>4.17, BUN 64, Potassium 4.3, Sodium 137, BNP 1677.7, Magnesium 2.0, A1c 7.5   Vital Signs: Weight: 273 lbs (admission weight: 281  lbs) Blood pressure: 150/60s  Heart rate: 50s  I/O: net +0.2L yesterday; net -4.3L since admission  Medication Assistance / Insurance Benefits Check: Does the patient have prescription insurance?  Yes Type of insurance plan: [place] [employer]  Outpatient Pharmacy:  Prior to admission outpatient pharmacy: [facility] Is the patient willing to use [facility] TOC pharmacy at discharge? Yes    Assessment: 1. Acute on chronic systolic CHF (LVEF 35-40%). NYHA class III symptoms. - On furosemide 80 mg IV TID. Creatinine bump from yesterday 3.84>4.17. Remains volume overloaded. May need to increase lasix further to aid with diuresis and renal congestion. Waiting for UNNA boots to be placed, hopefully today. Strict I/Os and daily weights. Keep K>4 and Mg>2. - Continue carvedilol 3.125 mg BID - reduced dose from PTA with episodes of bradycardia - Holding PTA losartan and Farxiga with AKI on CKD IV - Continue hydralazine 100 mg TID and amlodipine 10 mg daily  - Consider increasing Imdur to 60 mg daily  Plan: 1) Medication changes recommended at this time: - Increase IV lasix if ok with renal  - Increase Imdur to 60 mg daily  2) Patient assistance: - Has [place] Medicaid  3)  Education  - Initial education completed - Full education to be completed prior to discharge  Sharen Hones, PharmD, BCPS Heart Failure Engineer, building services Phone [phone]

## 2023-10-01 NOTE — Progress Notes (Signed)
 Orthopedic Tech Progress Note Patient Details:  Lori Mclaughlin Aug 31, 1960 [id number]  Ortho Devices Type of Ortho Device: Radio broadcast assistant Ortho Device/Splint Location: Bilateral Ortho Device/Splint Interventions: Ordered, Application   Post Interventions Patient Tolerated: Well  Tonye Pearson 10/01/2023, 12:37 PM

## 2023-10-01 NOTE — Progress Notes (Addendum)
 Patient Name: Lori Mclaughlin Date of Encounter: 10/01/2023 [facility] Cardiologist: Armanda Magic, MD   Interval Summary  .    Patient report significant urine output with increased lasix from nephrology. Discussed plans to continue with lasix today.  Vital Signs .    Vitals:   09/30/23 1600 09/30/23 2019 09/30/23 2053 10/01/23 0419  BP: (!) 149/70 (!) 142/60 (!) 142/60 (!) 150/62  Pulse: (!) 50 (!) 51  (!) 56  Resp: 20 19  20   Temp: 97.6 F (36.4 C) (!) 97.4 F (36.3 C)  98.3 F (36.8 C)  TempSrc: Oral Oral  Oral  SpO2: 100%   96%  Weight:    122 kg  Height:        Intake/Output Summary (Last 24 hours) at 10/01/2023 0929 Last data filed at 10/01/2023 0400 Gross per 24 hour  Intake 1476 ml  Output 900 ml  Net 576 ml      10/01/2023    4:19 AM 09/30/2023    4:12 AM 09/29/2023    5:59 AM  Last 3 Weights  Weight (lbs) 268 lb 14.4 oz 273 lb 8 oz 272 lb 1.6 oz  Weight (kg) 121.972 kg 124.059 kg 123.424 kg      Telemetry/ECG    Sinus bradycardia with PVCs - Personally Reviewed  Physical Exam .   GEN: No acute distress.   Neck: No JVD Cardiac: RRR, no murmurs, rubs, or gallops.  Respiratory: Clear to auscultation bilaterally. GI: Soft, nontender, non-distended  MS: No edema  Assessment & Plan .   Kaelin L Gilliam-Rowell is a 63 y.o. female with asthma, chronic diastolic CHF, CKDIV, diabetes mellitus type 2 (uncontrolled), GERD, hypertension, microcytic anemia, morbid obesity, OSA with nocturnal hypoxemia, and osteoarthritis who presented as direct admission from clinic with acute on chronic HFpEF.   Acute on chronic HFrEF (new cardiomyopathy) Patient with progressive dyspnea and LE edema since last fall despite home lasix 20mg  BID. On admission, BNP 1677 with weight 281lbs. TTE this admission shows EF 35-40%, global hypokinesis, moderate LVH, G2DD, moderately reduced RV function, trivial MR, dilated IVC. Prior echo from 2022 had preserved LVEF. Patient  net negative 4.3L with weight down to 268lbs. Remains volume up. Continue IV lasix per nephrology (see below).  Continue afterload reduction with Amlodipine 10mg , Imdur 30mg  (increase to 60mg ), Hydralazine 100mg  TID. Bradycardic to 40s, carvedilol Continue Unna boots Unable to use SGLT2, MRA, ARB due to renal dysfunction With new cardiomyopathy but renal dysfunction, favor outpatient evaluation of coronary perfusion with PET stress.   Hypertension BP essentially unchanged this admission, 150s/60s despite uptitration of medications. Bradycardia limits further titration of beta blocker. Amlodipine 10mg , Hydralazine 100mg  TID.  Bradycardic to 40s, discontinued carvedilol Increase Imdur to 60mg .  Acute on chronic CKD IV Patient previously followed by nephrology, has not been seen in over a year. Creatinine on admission 3.44, now up to 4.17. Nephrology now follow inpatient, appreciate reqs. They have increased Lasix to 120mg  TID.   Hyperlipidemia LDL goal 70mg /dL, LDL 34 mg/dL. Continue Lipitor 20mg   DM type II Continue SSI.   For questions or updates, please contact [facility] Please consult [email or web address] for contact info under    Signed, Perlie Gold, PA-C   Patient seen and examined.  Agree with above documentation.  On exam, patient is alert and oriented, regular rate and rhythm, no murmurs, diminished breath sounds, 1+ LE edema, +JVD.  Remains volume overloaded on exam.  Nephrology following, appreciate assistance.  Increased IV  Lasix to 120 mg 3 times daily today, will continue to closely monitor renal function with diuresis.  Little Ishikawa, MD

## 2023-10-01 NOTE — Discharge Instructions (Addendum)
 Medication Changes: - INCREASE Lasix to 40mg  twice daily. - START Isosorbide mononitrate (Imdur) 90mg  once daily. - START Ferrous sulfate (iron supplement) 325mg  once daily. - STOP Losartan. - STOP Carvedilol. - STOP Farxiga. - Continue all other home medications. _______________  Heart Failure Education: Weigh yourself EVERY morning after you go to the bathroom but before you eat or drink anything. Write this number down in a weight log/diary. If you gain 3 pounds overnight or 5 pounds in a week, call the office. Take your medicines as prescribed. If you have concerns about your medications, please call us before you stop taking them.  Eat low salt foods--Limit salt (sodium) to 2000 mg per day. This will help prevent your body from holding onto fluid. Read food labels as many processed foods have a lot of sodium, especially canned goods and prepackaged meats. If you would like some assistance choosing low sodium foods, we would be happy to set you up with a nutritionist. Limit all fluids for the day to less than 2 liters (64 ounces). Fluid includes all drinks, coffee, juice, ice chips, soup, jello, and all other liquids. Stay as active as you can everyday. Staying active will give you more energy and make your muscles stronger. Start with 5 minutes at a time and work your way up to 30 minutes a day. Break up your activities--do some in the morning and some in the afternoon. Start with 3 days per week and work your way up to 5 days as you can.  If you have chest pain, feel short of breath, dizzy, or lightheaded, STOP. If you don't feel better after a short rest, call 911. If you do feel better, call the office to let us know you have symptoms with exercise.  _______________  Low Sodium Nutrition Therapy   Eating less sodium can help you if you have high blood pressure, heart failure, or kidney or liver disease.   Your body needs a little sodium, but too much sodium can cause your body to hold  onto extra water. This extra water will raise your blood pressure and can cause damage to your heart, kidneys, or liver as they are forced to work harder.   Sometimes you can see how the extra fluid affects you because your hands, legs, or belly swell. You may also hold water around your heart and lungs, which makes it hard to breathe.   Even if you take medication for blood pressure or a water pill (diuretic) to remove fluid, it is still important to have less salt in your diet.   Check with your primary care provider before drinking alcohol since it may affect the amount of fluid in your body and how your heart, kidneys, or liver work. Sodium in Food A low-sodium meal plan limits the sodium that you get from food and beverages to 1,500-2,000 milligrams (mg) per day. Salt is the main source of sodium. Read the nutrition label on the package to find out how much sodium is in one serving of a food.  Select foods with 140 milligrams (mg) of sodium or less per serving.  You may be able to eat one or two servings of foods with a little more than 140 milligrams (mg) of sodium if you are closely watching how much sodium you eat in a day.  Check the serving size on the label. The amount of sodium listed on the label shows the amount in one serving of the food. So, if you eat more  than one serving, you will get more sodium than the amount listed.  Tips Cutting Back on Sodium Eat more fresh foods.  Fresh fruits and vegetables are low in sodium, as well as frozen vegetables and fruits that have no added juices or sauces.  Fresh meats are lower in sodium than processed meats, such as bacon, sausage, and hotdogs.  Not all processed foods are unhealthy, but some processed foods may have too much sodium.  Eat less salt at the table and when cooking. One of the ingredients in salt is sodium.  One teaspoon of table salt has 2,300 milligrams of sodium.  Leave the salt out of recipes for pasta, casseroles, and  soups. Be a Engineer, building services.  Food packages that say "Salt-free", sodium-free", "very low sodium," and "low sodium" have less than 140 milligrams of sodium per serving.  Beware of products identified as "Unsalted," "No Salt Added," "Reduced Sodium," or "Lower Sodium." These items may still be high in sodium. You should always check the nutrition label. Add flavors to your food without adding sodium.  Try lemon juice, lime juice, or vinegar.  Dry or fresh herbs add flavor.  Buy a sodium-free seasoning blend or make your own at home. You can purchase salt-free or sodium-free condiments like barbeque sauce in stores and online. Ask your registered dietitian nutritionist for recommendations and where to find them.   Eating in Restaurants Choose foods carefully when you eat outside your home. Restaurant foods can be very high in sodium. Many restaurants provide nutrition facts on their menus or their websites. If you cannot find that information, ask your server. Let your server know that you want your food to be cooked without salt and that you would like your salad dressing and sauces to be served on the side.    Foods Recommended Food Group Foods Recommended  Grains Bread, bagels, rolls without salted tops Homemade bread made with reduced-sodium baking powder Cold cereals, especially shredded wheat and puffed rice Oats, grits, or cream of wheat Pastas, quinoa, and rice Popcorn, pretzels or crackers without salt Corn tortillas  Protein Foods Fresh meats and fish; [place] bacon (check the nutrition labels - make sure they are not packaged in a sodium solution) Canned or packed tuna (no more than 4 ounces at 1 serving) Beans and peas Soybeans) and tofu Eggs Nuts or nut butters without salt  Dairy Milk or milk powder Plant milks, such as rice and soy Yogurt, including Greek yogurt Small amounts of natural cheese (blocks of cheese) or reduced-sodium cheese can be used in moderation. (Swiss,  ricotta, and fresh mozzarella cheese are lower in sodium than the others) Cream Cheese Low sodium cottage cheese  Vegetables Fresh and frozen vegetables without added sauces or salt Homemade soups (without salt) Low-sodium, salt-free or sodium-free canned vegetables and soups  Fruit Fresh and canned fruits Dried fruits, such as raisins, cranberries, and prunes  Oils Tub or liquid margarine, regular or without salt Canola, corn, peanut, olive, safflower, or sunflower oils  Condiments Fresh or dried herbs such as basil, bay leaf, dill, mustard (dry), nutmeg, paprika, parsley, rosemary, sage, or thyme.  Low sodium ketchup Vinegar  Lemon or lime juice Pepper, red pepper flakes, and cayenne. Hot sauce contains sodium, but if you use just a drop or two, it will not add up to much.  Salt-free or sodium-free seasoning mixes and marinades Simple salad dressings: vinegar and oil   Foods Not Recommended Food Group Foods Not Recommended  Grains Breads or  crackers topped with salt Cereals (hot/cold) with more than 300 mg sodium per serving Biscuits, cornbread, and other "quick" breads prepared with baking soda Pre-packaged bread crumbs Seasoned and packaged rice and pasta mixes Self-rising flours  Protein Foods Cured meats: Bacon, ham, sausage, pepperoni and hot dogs Canned meats (chili, vienna sausage, or sardines) Smoked fish and meats Frozen meals that have more than 600 mg of sodium per serving Egg substitute (with added sodium)  Dairy Buttermilk Processed cheese spreads Cottage cheese (1 cup may have over 500 mg of sodium; look for low-sodium.) American or feta cheese Shredded Cheese has more sodium than blocks of cheese String cheese  Vegetables Canned vegetables (unless they are salt-free, sodium-free or low sodium) Frozen vegetables with seasoning and sauces Sauerkraut and pickled vegetables Canned or dried soups (unless they are salt-free, sodium-free, or low sodium) [place]  fries and onion rings  Fruit Dried fruits preserved with additives that have sodium  Oils Salted butter or margarine, all types of olives  Condiments Salt, sea salt, kosher salt, onion salt, and garlic salt Seasoning mixes with salt Bouillon cubes Ketchup Barbeque sauce and Worcestershire sauce unless low sodium Soy sauce Salsa, pickles, olives, relish Salad dressings: ranch, blue cheese, [place], and [place].   Low Sodium Sample 1-Day Menu  Breakfast 1 cup cooked oatmeal  1 slice whole wheat bread toast  1 tablespoon peanut butter without salt  1 banana  1 cup 1% milk  Lunch Tacos made with: 2 corn tortillas   cup black beans, low sodium   cup roasted or grilled chicken (without skin)   avocado  Squeeze of lime juice  1 cup salad greens  1 tablespoon low-sodium salad dressing   cup strawberries  1 orange  Afternoon Snack 1/3 cup grapes  6 ounces yogurt  Evening Meal 3 ounces herb-baked fish  1 baked potato  2 teaspoons olive oil   cup cooked carrots  2 thick slices tomatoes on:  2 lettuce leaves  1 teaspoon olive oil  1 teaspoon balsamic vinegar  1 cup 1% milk  Evening Snack 1 apple   cup almonds without salt   Low-Sodium Vegetarian (Lacto-Ovo) Sample 1-Day Menu  Breakfast 1 cup cooked oatmeal  1 slice whole wheat toast  1 tablespoon peanut butter without salt  1 banana  1 cup 1% milk  Lunch Tacos made with: 2 corn tortillas   cup black beans, low sodium   cup roasted or grilled chicken (without skin)   avocado  Squeeze of lime juice  1 cup salad greens  1 tablespoon low-sodium salad dressing   cup strawberries  1 orange  Evening Meal Stir fry made with:  cup tofu  1 cup brown rice   cup broccoli   cup green beans   cup peppers   tablespoon peanut oil  1 orange  1 cup 1% milk  Evening Snack 4 strips celery  2 tablespoons hummus  1 hard-boiled egg   Low-Sodium Vegan Sample 1-Day Menu  Breakfast 1 cup cooked oatmeal  1 tablespoon  peanut butter without salt  1 cup blueberries  1 cup soymilk fortified with calcium, vitamin B12, and vitamin D  Lunch 1 small whole wheat pita   cup cooked lentils  2 tablespoons hummus  4 carrot sticks  1 medium apple  1 cup soymilk fortified with calcium, vitamin B12, and vitamin D  Evening Meal Stir fry made with:  cup tofu  1 cup brown rice   cup broccoli   cup green  beans   cup peppers   tablespoon peanut oil  1 cup cantaloupe  Evening Snack 1 cup soy yogurt   cup mixed nuts  Copyright 2020  Academy of Nutrition and Dietetics. All rights reserved  Sodium Free Flavoring Tips  When cooking, the following items may be used for flavoring instead of salt or seasonings that contain sodium. Remember: A little bit of spice goes a long way! Be careful not to overseason. Spice Blend Recipe (makes about ? cup) 5 teaspoons onion powder  2 teaspoons garlic powder  2 teaspoons paprika  2 teaspoon dry mustard  1 teaspoon crushed thyme leaves   teaspoon white pepper   teaspoon celery seed Food Item Flavorings  Beef Basil, bay leaf, caraway, curry, dill, dry mustard, garlic, grape jelly, green pepper, mace, marjoram, mushrooms (fresh), nutmeg, onion or onion powder, parsley, pepper, rosemary, sage  Chicken Basil, cloves, cranberries, mace, mushrooms (fresh), nutmeg, oregano, paprika, parsley, pineapple, saffron, sage, savory, tarragon, thyme, tomato, turmeric  Egg Chervil, curry, dill, dry mustard, garlic or garlic powder, green pepper, jelly, mushrooms (fresh), nutmeg, onion powder, paprika, parsley, rosemary, tarragon, tomato  Fish Basil, bay leaf, chervil, curry, dill, dry mustard, green pepper, lemon juice, marjoram, mushrooms (fresh), paprika, pepper, tarragon, tomato, turmeric  Lamb Cloves, curry, dill, garlic or garlic powder, mace, mint, mint jelly, onion, oregano, parsley, pineapple, rosemary, tarragon, thyme  Pork Applesauce, basil, caraway, chives, cloves, garlic  or garlic powder, onion or onion powder, rosemary, thyme  Veal Apricots, basil, bay leaf, currant jelly, curry, ginger, marjoram, mushrooms (fresh), oregano, paprika  Vegetables Basil, dill, garlic or garlic powder, ginger, lemon juice, mace, marjoram, nutmeg, onion or onion powder, tarragon, tomato, sugar or sugar substitute, salt-free salad dressing, vinegar  Desserts Allspice, anise, cinnamon, cloves, ginger, mace, nutmeg, vanilla extract, other extracts   Copyright 2020  Academy of Nutrition and Dietetics. All rights reserved  Fluid Restricted Nutrition Therapy  You have been prescribed this diet because your condition affects how much fluid you can eat or drink. If your heart, liver, or kidneys aren't working properly, you may not be able to effectively eliminate fluids from the body and this may cause swelling (edema) in the legs, arms, and/or stomach. Drink no more than 2 liters or 67 ounces or 8 1/3 cups of fluid per day.  You don't need to stop eating or drinking the same fluids you normally would, but you may need to eat or drink less than usual.  Your registered dietitian nutritionist will help you determine the correct amount of fluid to consume during the day Breakfast Include fluids taken with medications  Lunch Include fluids taken with medications  Dinner Include fluids taken with medications  Bedtime Snack Include fluids taken with medications     Tips What Are Fluids?  A fluid is anything that is liquid or anything that would melt if left at room temperature. You will need to count these foods and liquids--including any liquid used to take medication--as part of your daily fluid intake. Some examples are: Alcohol (drink only with your doctor's permission)  Coffee, tea, and other hot beverages  Gelatin (Jell-O)  Gravy  Ice cream, sherbet, sorbet  Ice cubes, ice chips  Milk, liquid creamer  Nutritional supplements  Popsicles  Vegetable and fruit juices; fluid in  canned fruit  Watermelon  Yogurt  Soft drinks, lemonade, limeade  Soups  Syrup How Do I Measure My Fluid Intake? Record your fluid intake daily.  Tip: Every day, each time you eat  or drink fluids, pour water in the same amount into an empty container that can hold the same amount of fluids you are allowed daily. This may help you keep track of how much fluid you are taking in throughout the day.  To accurately keep track of how much liquid you take in, measure the size of the cups, glasses, and bowls you use. If you eat soup, measure how much of it is liquid and how much is solid (such as noodles, vegetables, meat). Conversions for Measuring Fluid Intake  Milliliters (mL) Liters (L) Ounces (oz) Cups (c)  1000 1 32 4  1200 1.2 40 5  1500 1.5 50 6 1/4  1800 1.8 60 7 1/2  2000 2 67 8 1/3  Tips to Reduce Your Thirst Chew gum or suck on hard candy.  Rinse or gargle with mouthwash. Do not swallow.  Ice chips or popsicles my help quench thirst, but this too needs to be calculated into the total restriction. Melt ice chips or cubes first to figure out how much fluid they produce (for example, experiment with melting  cup ice chips or 2 ice cubes).  Add a lemon wedge to your water.  Limit how much salt you take in. A high salt intake might make you thirstier.  Don't eat or drink all your allowed liquids at once. Space your liquids out through the day.  Use small glasses and cups and sip slowly. If allowed, take your medications with fluids you eat or drink during a meal.   Fluid-Restricted Nutrition Therapy Sample 1-Day Menu  Breakfast 1 slice wheat toast  1 tablespoon peanut butter  1/2 cup yogurt (120 milliliters)  1/2 cup blueberries  1 cup milk (240 milliliters)   Lunch 3 ounces sliced [place]  2 slices whole wheat bread  1/2 cup lettuce for sandwich  2 slices tomato for sandwich  1 ounce reduced-fat, reduced-sodium cheese  1/2 cup fresh carrot sticks  1 banana  1 cup unsweetened  tea (240 milliliters)   Evening Meal 8 ounces soup (240 milliliters)  3 ounces salmon  1/2 cup quinoa  1 cup green beans  1 cup mixed greens salad  1 tablespoon olive oil  1 cup coffee (240 milliliters)  Evening Snack 1/2 cup sliced peaches  1/2 cup frozen yogurt (120 milliliters)  1 cup water (240 milliliters)  Copyright 2020  Academy of Nutrition and Dietetics. All rights reserved

## 2023-10-01 NOTE — Progress Notes (Signed)
 Mobility Specialist Progress Note:    10/01/23 1453  Mobility  Activity Ambulated with assistance in room  Level of Assistance Standby assist, set-up cues, supervision of patient - no hands on  Assistive Device Cane  Distance Ambulated (ft) 10 ft  Activity Response Tolerated well  Mobility Referral Yes  Mobility visit 1 Mobility  Mobility Specialist Start Time (ACUTE ONLY) 1400  Mobility Specialist Stop Time (ACUTE ONLY) 1415  Mobility Specialist Time Calculation (min) (ACUTE ONLY) 15 min   Pt received in chair declined ambulation d/t fatigue and cough but requests to use the [facility] and returned to bed. No physical assistance needed throughout. Void complete. Returned to bed w/o fault. Call bell and personal belongings in reach. All needs met.  Thompson Grayer Mobility Specialist  Please contact vis Secure Chat or  Rehab Office [phone]

## 2023-10-01 NOTE — Progress Notes (Signed)
   10/01/23 2325  BiPAP/CPAP/SIPAP  $ Non-Invasive Home Ventilator  Initial  BiPAP/CPAP/SIPAP Pt Type Adult  BiPAP/CPAP/SIPAP Resmed  Mask Type Full face mask  Dentures removed? Not applicable  Mask Size  (pt mask, our unit)  Respiratory Rate 21 breaths/min  PEEP 10 cmH20  FiO2 (%) 21 %  Minute Ventilation 9.5  Leak 7  Tidal Volume (Vt) 444  Patient Home Equipment No  Auto Titrate No  CPAP/SIPAP surface wiped down Yes  Safety Check Completed by RT for Home Unit Yes, issues noted (pt has rust forming in her unit. RT swtiched pt to our unit, with pt mask.)

## 2023-10-01 NOTE — Progress Notes (Addendum)
 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  Subjective:  Only 0.9 L UOP reported, -4.4 L total since admission, weights are down about 4 kg Still has 2+ edema distal to the knees Not as winded when up and moving Creatinine 4.17, K4.3, magnesium 2.0  03/06 0701 - 03/07 0700 In: 1476 [P.O.:1470; I.V.:6] Out: 900 [Urine:900]  Filed Weights   09/29/23 0559 09/30/23 0412 10/01/23 0419  Weight: 123.4 kg 124.1 kg 122 kg    Scheduled Meds:  amLODipine  10 mg Oral Daily   atorvastatin  20 mg Oral Daily   carvedilol  3.125 mg Oral BID WC   heparin  5,000 Units Subcutaneous Q8H   hydrALAZINE  100 mg Oral TID   insulin aspart  0-15 Units Subcutaneous TID WC   [START ON 10/02/2023] isosorbide mononitrate  60 mg Oral Daily   sodium chloride flush  3 mL Intravenous Q12H   Continuous Infusions:  furosemide     PRN Meds:.acetaminophen, guaiFENesin-dextromethorphan, sodium chloride flush  Current Labs: reviewed   Latest Reference Range & Units 09/30/23 19:22  Protein Creatinine Ratio 0.00 - 0.15 mg/mgCre 1.81 (H)  (H): Data is abnormally high  Physical Exam:  Blood pressure (!) 150/62, pulse (!) 56, temperature 98.3 F (36.8 C), temperature source Oral, resp. rate 20, height 5' (1.524 m), weight 122 kg, last menstrual period 02/23/2012, SpO2 96%. 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 Malen G[facility]ze at [facility]; subnephrotic Creatinine slightly worse in the setting of diuresis for problem #2 Remains hypervolemic Renal ultrasound during admission without acute structural issues AoC mixed CHF: Volume status improving, symptomatology improving but not yet resolved, cardiology following.   On beta-blocker, hydralazine/nitrate DM2 Obesity Anemia: Microcytic with TSAT of 7%   P Continue diuresis, increase Lasix to 120 mg IV 3 times daily Hopeful can transition to oral regimen over the weekend Start oral iron Registered dietitian consult for low-sodium diet Medication Issues; Preferred narcotic agents for pain control are hydromorphone, fentanyl, and methadone. Morphine should not be used.  Baclofen should be avoided Avoid oral sodium phosphate and magnesium citrate based laxatives / bowel preps    Sabra Heck MD 10/01/2023, 10:44 AM  Recent Labs  Lab 09/29/23 0453 09/30/23 0455 10/01/23 0446  NA 142 139 137  K 4.7 4.4 4.3  CL 111 113* 109  CO2 20* 20* 21*  GLUCOSE 115* 135* 168*  BUN 56* 59* 64*  CREATININE 3.85* 3.84* 4.17*  CALCIUM 8.7* 8.2* 8.1*   Recent Labs  Lab 09/27/23 2122 09/28/23 0635 09/29/23 0805  WBC 6.5 5.4 5.9  NEUTROABS 4.6 3.7  --   HGB 8.1* 7.5* 7.9*  HCT 26.7* 24.6* 25.8*  MCV 71.2* 70.7* 69.9*  PLT 187 168 183

## 2023-10-02 DIAGNOSIS — I130 Hypertensive heart and chronic kidney disease with heart failure and stage 1 through stage 4 chronic kidney disease, or unspecified chronic kidney disease: Secondary | ICD-10-CM | POA: Diagnosis not present

## 2023-10-02 DIAGNOSIS — I5043 Acute on chronic combined systolic (congestive) and diastolic (congestive) heart failure: Secondary | ICD-10-CM | POA: Diagnosis not present

## 2023-10-02 DIAGNOSIS — Z23 Encounter for immunization: Secondary | ICD-10-CM | POA: Diagnosis not present

## 2023-10-02 DIAGNOSIS — D631 Anemia in chronic kidney disease: Secondary | ICD-10-CM | POA: Diagnosis not present

## 2023-10-02 DIAGNOSIS — D649 Anemia, unspecified: Secondary | ICD-10-CM | POA: Diagnosis not present

## 2023-10-02 DIAGNOSIS — R0602 Shortness of breath: Secondary | ICD-10-CM | POA: Diagnosis not present

## 2023-10-02 DIAGNOSIS — E1122 Type 2 diabetes mellitus with diabetic chronic kidney disease: Secondary | ICD-10-CM | POA: Diagnosis not present

## 2023-10-02 DIAGNOSIS — J45909 Unspecified asthma, uncomplicated: Secondary | ICD-10-CM | POA: Diagnosis not present

## 2023-10-02 DIAGNOSIS — I429 Cardiomyopathy, unspecified: Secondary | ICD-10-CM | POA: Diagnosis not present

## 2023-10-02 DIAGNOSIS — E662 Morbid (severe) obesity with alveolar hypoventilation: Secondary | ICD-10-CM | POA: Diagnosis not present

## 2023-10-02 DIAGNOSIS — N179 Acute kidney failure, unspecified: Secondary | ICD-10-CM | POA: Diagnosis not present

## 2023-10-02 DIAGNOSIS — I5082 Biventricular heart failure: Secondary | ICD-10-CM | POA: Diagnosis not present

## 2023-10-02 DIAGNOSIS — E1165 Type 2 diabetes mellitus with hyperglycemia: Secondary | ICD-10-CM | POA: Diagnosis not present

## 2023-10-02 DIAGNOSIS — N184 Chronic kidney disease, stage 4 (severe): Secondary | ICD-10-CM | POA: Diagnosis not present

## 2023-10-02 DIAGNOSIS — Z6841 Body Mass Index (BMI) 40.0 and over, adult: Secondary | ICD-10-CM | POA: Diagnosis not present

## 2023-10-02 LAB — GLUCOSE, CAPILLARY
Glucose-Capillary: 168 mg/dL — ABNORMAL HIGH (ref 70–99)
Glucose-Capillary: 199 mg/dL — ABNORMAL HIGH (ref 70–99)
Glucose-Capillary: 198 mg/dL — ABNORMAL HIGH (ref 70–99)
Glucose-Capillary: 178 mg/dL — ABNORMAL HIGH (ref 70–99)

## 2023-10-02 LAB — BASIC METABOLIC PANEL
Anion gap: 13 (ref 5–15)
BUN: 64 mg/dL — ABNORMAL HIGH (ref 8–23)
CO2: 21 mmol/L — ABNORMAL LOW (ref 22–32)
Calcium: 8.4 mg/dL — ABNORMAL LOW (ref 8.9–10.3)
Chloride: 102 mmol/L (ref 98–111)
Creatinine, Ser: 4.1 mg/dL — ABNORMAL HIGH (ref 0.44–1.00)
GFR, Estimated: 12 mL/min — ABNORMAL LOW (ref >60)
Glucose, Bld: 169 mg/dL — ABNORMAL HIGH (ref 70–99)
Potassium: 4 mmol/L (ref 3.5–5.1)
Sodium: 136 mmol/L (ref 135–145)

## 2023-10-02 LAB — MAGNESIUM: Magnesium: 2 mg/dL (ref 1.7–2.4)

## 2023-10-02 MED ORDER — ONDANSETRON HCL 4 MG/2ML IJ SOLN
4.0000 mg | Freq: Four times a day (QID) | INTRAMUSCULAR | Status: DC | PRN
  Administered 2023-10-02: 4 mg via INTRAVENOUS
  Filled 2023-10-02: qty 2

## 2023-10-02 NOTE — Progress Notes (Signed)
 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    03/07 0701 - 03/08 0700 In: 340 [P.O.:240; IV Piggyback:100] Out: 2900 [Urine:2900]  Filed Weights   09/30/23 0412 10/01/23 0419 10/02/23 0324  Weight: 124.1 kg 122 kg 120.4 kg    Scheduled Meds:  amLODipine  10 mg Oral Daily   atorvastatin  20 mg Oral Daily   ferrous sulfate  325 mg Oral Q breakfast   heparin  5,000 Units Subcutaneous Q8H   hydrALAZINE  100 mg Oral TID   insulin aspart  0-15 Units Subcutaneous TID WC   isosorbide mononitrate  60 mg Oral Daily   sodium chloride flush  3 mL Intravenous Q12H   Continuous Infusions:  furosemide 120 mg (10/02/23 0921)   PRN Meds:.acetaminophen, guaiFENesin-dextromethorphan, sodium chloride flush  Current Labs: reviewed   Latest Reference Range & Units 09/30/23 19:22  Protein Creatinine Ratio 0.00 - 0.15 mg/mgCre 1.81 (H)  (H): Data is abnormally high  Physical Exam:  Blood pressure (!) 154/72, pulse 60, temperature 98 F (36.7 C), temperature source Oral, resp. rate 17, height 5' (1.524 m), weight 120.4 kg, last menstrual period 02/23/2012, SpO2 96%. 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 [facility]ze at [facility]; subnephrotic Creatinine slightly worse in the setting of diuresis for problem #2 Remains hypervolemic but improving Renal ultrasound during admission without acute structural issues AoC mixed CHF: Volume status improving, symptomatology improving but not  yet resolved, cardiology following.  On beta-blocker, hydralazine/nitrate DM2 Obesity Anemia: Microcytic with TSAT of 7%; started oral iron as inpatient   P Continue Lasix 120 mg IV 3 times daily for another 24 hours and then anticipate transition to oral regimen Medication Issues; Preferred narcotic agents for pain control are hydromorphone, fentanyl, and methadone. Morphine should not be used.  Baclofen should be avoided Avoid oral sodium phosphate and magnesium citrate based laxatives / bowel preps    Sabra Heck MD 10/02/2023, 9:58 AM  Recent Labs  Lab 09/30/23 0455 10/01/23 0446 10/02/23 0423  NA 139 137 136  K 4.4 4.3 4.0  CL 113* 109 102  CO2 20* 21* 21*  GLUCOSE 135* 168* 169*  BUN 59* 64* 64*  CREATININE 3.84* 4.17* 4.10*  CALCIUM 8.2* 8.1* 8.4*   Recent Labs  Lab 09/27/23 2122 09/28/23 0635 09/29/23 0805  WBC 6.5 5.4 5.9  NEUTROABS 4.6 3.7  --   HGB 8.1* 7.5* 7.9*  HCT 26.7* 24.6* 25.8*  MCV 71.2* 70.7* 69.9*  PLT 187 168 183

## 2023-10-02 NOTE — Plan of Care (Signed)
  Problem: Education: Goal: Ability to demonstrate management of disease process will improve Outcome: Progressing Goal: Ability to verbalize understanding of medication therapies will improve Outcome: Progressing   Problem: Activity: Goal: Capacity to carry out activities will improve Outcome: Progressing   Problem: Cardiac: Goal: Ability to achieve and maintain adequate cardiopulmonary perfusion will improve Outcome: Progressing

## 2023-10-02 NOTE — Progress Notes (Signed)
 Rounding Note    Patient Name: Lori Mclaughlin Date of Encounter: 10/02/2023  [facility] Cardiologist: Armanda Magic, MD    Subjective   63 year old female with a history of asthma, chronic diastolic congestive heart failure, CKD, type 2 diabetes mellitus, hypertension, microcytic anemia, morbid obesity, obstructive sleep apnea  She was admitted as a direct admission from clinic for acute on chronic HFpEF.  Updated echocardiogram performed September 28, 2023 reveals moderate LV dysfunction with EF of 35 to 40%. Grade 2 diastolic dysfunction RV function is moderately reduced in the RV is moderately enlarged.  She has diuresed 6.9 L so far during this hospitalization.  Her weight is 120 kg.  She has acute on chronic kidney disease.  Her baseline creatinine appears to be around 3.5.  Creatinine is up to 4.1. She is receiving Lasix 120 mg IV 3 times a day.  Dr. Verna Czech of nephrology is following.   Inpatient Medications    Scheduled Meds:  amLODipine  10 mg Oral Daily   atorvastatin  20 mg Oral Daily   ferrous sulfate  325 mg Oral Q breakfast   heparin  5,000 Units Subcutaneous Q8H   hydrALAZINE  100 mg Oral TID   insulin aspart  0-15 Units Subcutaneous TID WC   isosorbide mononitrate  60 mg Oral Daily   sodium chloride flush  3 mL Intravenous Q12H   Continuous Infusions:  furosemide 120 mg (10/02/23 0921)   PRN Meds: acetaminophen, guaiFENesin-dextromethorphan, sodium chloride flush   Vital Signs    Vitals:   10/01/23 2034 10/01/23 2111 10/02/23 0324 10/02/23 0911  BP: (!) 145/64 (!) 146/54  (!) 154/72  Pulse: 61  60   Resp: 18  17   Temp: (!) 97.5 F (36.4 C)  98 F (36.7 C)   TempSrc: Oral  Oral   SpO2: 98%  96%   Weight:   120.4 kg   Height:        Intake/Output Summary (Last 24 hours) at 10/02/2023 0921 Last data filed at 10/02/2023 0324 Gross per 24 hour  Intake 340 ml  Output 2900 ml  Net -2560 ml      10/02/2023    3:24 AM 10/01/2023     4:19 AM 09/30/2023    4:12 AM  Last 3 Weights  Weight (lbs) 265 lb 6.9 oz 268 lb 14.4 oz 273 lb 8 oz  Weight (kg) 120.4 kg 121.972 kg 124.059 kg      Telemetry    NSR  - Personally Reviewed  ECG     - Personally Reviewed  Physical Exam   GEN: morbidly obese , middle age female ,  NAD .   Neck: No JVD Cardiac: RRR, distant heart sounds,   Respiratory: few rales bilaterally  GI: Soft, nontender, non-distended  MS: lower legs wrapped.   + edema  Neuro:  Nonfocal  Psych: Normal affect   Labs    High Sensitivity Troponin:  No results for input(s): "TROPONINIHS" in the last 720 hours.   Chemistry Recent Labs  Lab 09/27/23 2122 09/28/23 0635 09/30/23 0455 10/01/23 0446 10/02/23 0423  NA 142   < > 139 137 136  K 4.8   < > 4.4 4.3 4.0  CL 114*   < > 113* 109 102  CO2 19*   < > 20* 21* 21*  GLUCOSE 181*   < > 135* 168* 169*  BUN 46*   < > 59* 64* 64*  CREATININE 3.44*   < >  3.84* 4.17* 4.10*  CALCIUM 8.5*   < > 8.2* 8.1* 8.4*  MG 2.1   < > 2.1 2.0 2.0  PROT 6.8  --   --   --   --   ALBUMIN 3.1*  --   --   --   --   AST 23  --   --   --   --   ALT 25  --   --   --   --   ALKPHOS 127*  --   --   --   --   BILITOT 0.5  --   --   --   --   GFRNONAA 14*   < > 13* 11* 12*  ANIONGAP 9   < > 6 7 13    < > = values in this interval not displayed.    Lipids  Recent Labs  Lab 09/29/23 0453  CHOL 89  TRIG 46  HDL 46  LDLCALC 34  CHOLHDL 1.9    Hematology Recent Labs  Lab 09/27/23 2122 09/28/23 0635 09/29/23 0805  WBC 6.5 5.4 5.9  RBC 3.75* 3.48* 3.69*  HGB 8.1* 7.5* 7.9*  HCT 26.7* 24.6* 25.8*  MCV 71.2* 70.7* 69.9*  MCH 21.6* 21.6* 21.4*  MCHC 30.3 30.5 30.6  RDW 20.4* 20.2* 19.9*  PLT 187 168 183   Thyroid No results for input(s): "TSH", "FREET4" in the last 168 hours.  BNP Recent Labs  Lab 09/27/23 2122  BNP 1,677.7*    DDimer No results for input(s): "DDIMER" in the last 168 hours.   Radiology    US RENAL Result Date: 10/01/2023 CLINICAL DATA:   Acute kidney injury. EXAM: RENAL / URINARY TRACT ULTRASOUND COMPLETE COMPARISON:  None Available. FINDINGS: Right Kidney: Renal measurements: 9.7 cm x 4.3 cm x 4.1 cm = volume: 89.93 mL. Echogenicity within normal limits. No mass or hydronephrosis visualized. A 1.0 cm shadowing echogenic focus is seen within the mid right kidney on the sagittal view, however, this could not be duplicated on the transverse view (as stated by the ultrasound technologist). Left Kidney: Renal measurements: 8.7 cm x 5.5 cm x 4.2 cm = volume: 106.52 mL. Echogenicity within normal limits. No mass or hydronephrosis visualized. Bladder: Appears normal for degree of bladder distention. Other: None. IMPRESSION: Findings, as described above, which may represent a nonobstructing right renal calculus. Electronically Signed   By: Aram Candela M.D.   On: 10/01/2023 00:00    Cardiac Studies      Patient Profile     63 y.o. 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  Acute on chronic HFrEF: The patient was previously known to have diastolic function but now has acute systolic as well as diastolic congestive heart failure.  She also has RV enlargement and RV dysfunction. She continues to diurese.  She is net -6.9 L so far during this hospitalization.  Nephrology is assisting [place] with that.  Her creatinine is 4.1.  We discussed the fact that at some point we may not be able to diurese her effectively and she may need to start dialysis.  She will need to discuss this further with nephrology.  2.  Hypertension: Continue amlodipine, hydralazine, isosorbide. Coreg was stopped due to her bradycardia .  Continue to diurese       For questions or updates, please contact [facility] Please  consult [email or web address] for contact info under        Signed, Kristeen Miss, MD  10/02/2023, 9:21 AM

## 2023-10-02 NOTE — Progress Notes (Signed)
 Mobility Specialist Progress Note:    10/02/23 1000  Mobility  Activity Ambulated with assistance in hallway  Level of Assistance Standby assist, set-up cues, supervision of patient - no hands on  Assistive Device Four point cane  Distance Ambulated (ft) 100 ft  Activity Response Tolerated well  Mobility Referral Yes  Mobility visit 1 Mobility  Mobility Specialist Start Time (ACUTE ONLY) [id number]  Mobility Specialist Stop Time (ACUTE ONLY) 1003  Mobility Specialist Time Calculation (min) (ACUTE ONLY) 11 min   Received pt on EOB having no complaints and agreeable to mobility. Took x1 standing rest break. Pt was asymptomatic throughout ambulation and returned to room w/o fault. Left on EOB w/ call bell in reach and all needs met.   D'Vante Earlene Plater Mobility Specialist Please contact via Special educational needs teacher or Rehab office at [phone]

## 2023-10-03 DIAGNOSIS — I130 Hypertensive heart and chronic kidney disease with heart failure and stage 1 through stage 4 chronic kidney disease, or unspecified chronic kidney disease: Secondary | ICD-10-CM | POA: Diagnosis not present

## 2023-10-03 DIAGNOSIS — R0602 Shortness of breath: Secondary | ICD-10-CM | POA: Diagnosis not present

## 2023-10-03 DIAGNOSIS — I5043 Acute on chronic combined systolic (congestive) and diastolic (congestive) heart failure: Secondary | ICD-10-CM | POA: Diagnosis not present

## 2023-10-03 DIAGNOSIS — N184 Chronic kidney disease, stage 4 (severe): Secondary | ICD-10-CM | POA: Diagnosis not present

## 2023-10-03 DIAGNOSIS — D649 Anemia, unspecified: Secondary | ICD-10-CM | POA: Diagnosis not present

## 2023-10-03 DIAGNOSIS — N179 Acute kidney failure, unspecified: Secondary | ICD-10-CM | POA: Diagnosis not present

## 2023-10-03 DIAGNOSIS — I5082 Biventricular heart failure: Secondary | ICD-10-CM | POA: Diagnosis not present

## 2023-10-03 LAB — BASIC METABOLIC PANEL
Anion gap: 12 (ref 5–15)
BUN: 64 mg/dL — ABNORMAL HIGH (ref 8–23)
CO2: 21 mmol/L — ABNORMAL LOW (ref 22–32)
Calcium: 8.5 mg/dL — ABNORMAL LOW (ref 8.9–10.3)
Chloride: 105 mmol/L (ref 98–111)
Creatinine, Ser: 4.32 mg/dL — ABNORMAL HIGH (ref 0.44–1.00)
GFR, Estimated: 11 mL/min — ABNORMAL LOW (ref >60)
Glucose, Bld: 180 mg/dL — ABNORMAL HIGH (ref 70–99)
Potassium: 4 mmol/L (ref 3.5–5.1)
Sodium: 138 mmol/L (ref 135–145)

## 2023-10-03 LAB — MAGNESIUM: Magnesium: 2.1 mg/dL (ref 1.7–2.4)

## 2023-10-03 LAB — GLUCOSE, CAPILLARY
Glucose-Capillary: 170 mg/dL — ABNORMAL HIGH (ref 70–99)
Glucose-Capillary: 162 mg/dL — ABNORMAL HIGH (ref 70–99)
Glucose-Capillary: 137 mg/dL — ABNORMAL HIGH (ref 70–99)
Glucose-Capillary: 155 mg/dL — ABNORMAL HIGH (ref 70–99)

## 2023-10-03 MED ORDER — TORSEMIDE 20 MG PO TABS
100.0000 mg | ORAL_TABLET | Freq: Every day | ORAL | Status: DC
  Administered 2023-10-03 – 2023-10-04 (×2): 100 mg via ORAL
  Filled 2023-10-03 (×2): qty 5

## 2023-10-03 NOTE — Progress Notes (Signed)
 Admit: 09/27/2023 LOS: 6  73F AoCKD4 in setting of AoC s/d CHF exacerbation  Subjective:  2.4 L urine output, weights down 4 kg from yesterday, about 10 kg from presentation No edema noted on exam, on room air, euvolemic Creatinine 4.3 from 4.1, K4.0, magnesium 2.1,  03/08 0701 - 03/09 0700 In: 369.9 [P.O.:240; I.V.:3; IV Piggyback:66.9] Out: 2400 [Urine:2400]  Filed Weights   10/01/23 0419 10/02/23 0324 10/03/23 0609  Weight: 122 kg 120.4 kg 116.3 kg    Scheduled Meds:  amLODipine  10 mg Oral Daily   atorvastatin  20 mg Oral Daily   ferrous sulfate  325 mg Oral Q breakfast   heparin  5,000 Units Subcutaneous Q8H   hydrALAZINE  100 mg Oral TID   insulin aspart  0-15 Units Subcutaneous TID WC   isosorbide mononitrate  60 mg Oral Daily   sodium chloride flush  3 mL Intravenous Q12H   torsemide  100 mg Oral Daily   Continuous Infusions:   PRN Meds:.acetaminophen, guaiFENesin-dextromethorphan, ondansetron (ZOFRAN) IV, sodium chloride flush  Current Labs: reviewed   Latest Reference Range & Units 09/30/23 19:22  Protein Creatinine Ratio 0.00 - 0.15 mg/mgCre 1.81 (H)  (H): Data is abnormally high  Physical Exam:  Blood pressure (!) 142/60, pulse 60, temperature 98.4 F (36.9 C), temperature source Oral, resp. rate 20, height 5' (1.524 m), weight 116.3 kg, last menstrual period 02/23/2012, SpO2 95%. 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 Malen [facility] at [facility]; subnephrotic Creatinine slightly worse in the setting of diuresis for problem #2 Have achieved euvolemia Renal ultrasound during admission without acute structural issues AoC mixed CHF: Volume status improving, now euvolemic.  Symptomatology  improved.  Cardiology following.  On beta-blocker, hydralazine/nitrate DM2 Obesity Anemia: Microcytic with TSAT of 7%; started oral iron as inpatient   P Stop IV Lasix Torsemide 100 mg by mouth this morning If volume status and kidney function stable okay for discharge tomorrow with close outpatient follow-up. Medication Issues; Preferred narcotic agents for pain control are hydromorphone, fentanyl, and methadone. Morphine should not be used.  Baclofen should be avoided Avoid oral sodium phosphate and magnesium citrate based laxatives / bowel preps    Sabra Heck MD 10/03/2023, 10:54 AM  Recent Labs  Lab 10/01/23 0446 10/02/23 0423 10/03/23 0552  NA 137 136 138  K 4.3 4.0 4.0  CL 109 102 105  CO2 21* 21* 21*  GLUCOSE 168* 169* 180*  BUN 64* 64* 64*  CREATININE 4.17* 4.10* 4.32*  CALCIUM 8.1* 8.4* 8.5*   Recent Labs  Lab 09/27/23 2122 09/28/23 0635 09/29/23 0805  WBC 6.5 5.4 5.9  NEUTROABS 4.6 3.7  --   HGB 8.1* 7.5* 7.9*  HCT 26.7* 24.6* 25.8*  MCV 71.2* 70.7* 69.9*  PLT 187 168 183

## 2023-10-03 NOTE — Progress Notes (Signed)
 Mobility Specialist Progress Note:    10/03/23 1000  Mobility  Activity Ambulated with assistance in hallway  Level of Assistance Standby assist, set-up cues, supervision of patient - no hands on  Assistive Device Four point cane  Distance Ambulated (ft) 80 ft  Activity Response Tolerated well  Mobility Referral Yes  Mobility visit 1 Mobility  Mobility Specialist Start Time (ACUTE ONLY) [id number]  Mobility Specialist Stop Time (ACUTE ONLY) 0956  Mobility Specialist Time Calculation (min) (ACUTE ONLY) 8 min   Pt received on EOB and agreeable. Required no physical assistance throughout. Took x1 standing rest break d/t SOB. Practiced pursed lipped breathing. Pt left on EOB with call bell and all needs met. VSS.  Lori Mclaughlin Mobility Specialist Please contact via Special educational needs teacher or Rehab office at [phone]

## 2023-10-03 NOTE — Progress Notes (Addendum)
 Rounding Note    Patient Name: Lori Mclaughlin Date of Encounter: 10/03/2023  [facility] Cardiologist: Armanda Magic, MD  / Chilton Si  Subjective   63 year old female with a history of asthma, chronic diastolic congestive heart failure, CKD, type 2 diabetes mellitus, hypertension, microcytic anemia, morbid obesity, obstructive sleep apnea  She was admitted as a direct admission from clinic for acute on chronic HFpEF.  Updated echocardiogram performed September 28, 2023 reveals moderate LV dysfunction with EF of 35 to 40%. Grade 2 diastolic dysfunction RV function is moderately reduced in the RV is moderately enlarged.  She has diuresed 9.2 L so far during this hospitalization.  Her weight is 116  kg.  She has acute on chronic kidney disease.  Her baseline creatinine appears to be around 3.5.  Creatinine is 4.3 this morning.  Dr. Marisue Humble of nephrology is managing her diuretics.  I've encouraged her to ambulate in the halls. She wants to go home as soon as it is safe .   She previously has been seen by Dr. Mayford Knife but more recently has been seen by Dr. Duke Salvia. She would like to be followed by Dr. Duke Salvia going forward .     Inpatient Medications    Scheduled Meds:  amLODipine  10 mg Oral Daily   atorvastatin  20 mg Oral Daily   ferrous sulfate  325 mg Oral Q breakfast   heparin  5,000 Units Subcutaneous Q8H   hydrALAZINE  100 mg Oral TID   insulin aspart  0-15 Units Subcutaneous TID WC   isosorbide mononitrate  60 mg Oral Daily   sodium chloride flush  3 mL Intravenous Q12H   torsemide  100 mg Oral Daily   Continuous Infusions:   PRN Meds: acetaminophen, guaiFENesin-dextromethorphan, ondansetron (ZOFRAN) IV, sodium chloride flush   Vital Signs    Vitals:   10/02/23 2236 10/02/23 2311 10/03/23 0609 10/03/23 0700  BP: (!) 155/65  (!) 142/60   Pulse:  60 60   Resp:  20 20   Temp:    98.4 F (36.9 C)  TempSrc:    Oral  SpO2:  97% 95%    Weight:   116.3 kg   Height:        Intake/Output Summary (Last 24 hours) at 10/03/2023 0914 Last data filed at 10/03/2023 1610 Gross per 24 hour  Intake 469.85 ml  Output 2400 ml  Net -1930.15 ml      10/03/2023    6:09 AM 10/02/2023    3:24 AM 10/01/2023    4:19 AM  Last 3 Weights  Weight (lbs) 256 lb 4.8 oz 265 lb 6.9 oz 268 lb 14.4 oz  Weight (kg) 116.257 kg 120.4 kg 121.972 kg      Telemetry    NSR  Personally Reviewed  ECG     - Personally Reviewed  Physical Exam   GEN: Morbidly obese, middle-aged female Neck: No JVD Cardiac: Regular rate S1-S2. Respiratory: Lungs are clear. GI: Soft, nontender, non-distended  MS: Her legs are wrapped.  Not much edema. Neuro:  Nonfocal  Psych: Normal affect   Labs    High Sensitivity Troponin:  No results for input(s): "TROPONINIHS" in the last 720 hours.   Chemistry Recent Labs  Lab 09/27/23 2122 09/28/23 0635 10/01/23 0446 10/02/23 0423 10/03/23 0552  NA 142   < > 137 136 138  K 4.8   < > 4.3 4.0 4.0  CL 114*   < > 109 102 105  CO2  19*   < > 21* 21* 21*  GLUCOSE 181*   < > 168* 169* 180*  BUN 46*   < > 64* 64* 64*  CREATININE 3.44*   < > 4.17* 4.10* 4.32*  CALCIUM 8.5*   < > 8.1* 8.4* 8.5*  MG 2.1   < > 2.0 2.0 2.1  PROT 6.8  --   --   --   --   ALBUMIN 3.1*  --   --   --   --   AST 23  --   --   --   --   ALT 25  --   --   --   --   ALKPHOS 127*  --   --   --   --   BILITOT 0.5  --   --   --   --   GFRNONAA 14*   < > 11* 12* 11*  ANIONGAP 9   < > 7 13 12    < > = values in this interval not displayed.    Lipids  Recent Labs  Lab 09/29/23 0453  CHOL 89  TRIG 46  HDL 46  LDLCALC 34  CHOLHDL 1.9    Hematology Recent Labs  Lab 09/27/23 2122 09/28/23 0635 09/29/23 0805  WBC 6.5 5.4 5.9  RBC 3.75* 3.48* 3.69*  HGB 8.1* 7.5* 7.9*  HCT 26.7* 24.6* 25.8*  MCV 71.2* 70.7* 69.9*  MCH 21.6* 21.6* 21.4*  MCHC 30.3 30.5 30.6  RDW 20.4* 20.2* 19.9*  PLT 187 168 183   Thyroid No results for input(s):  "TSH", "FREET4" in the last 168 hours.  BNP Recent Labs  Lab 09/27/23 2122  BNP 1,677.7*    DDimer No results for input(s): "DDIMER" in the last 168 hours.   Radiology    No results found.   Cardiac Studies      Patient Profile     63 y.o. 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  Acute on chronic HFrEF: Patient has been diuresed very well.  Unfortunately, her creatinine has increased.  Her IV Lasix has been discontinued.  Dr. Marisue Humble of nephrology is helping [place] with her diuretic regimen.  I will defer to Dr. Marisue Humble as to when she may be stable enough to go home.  I have asked her to ambulate in the halls make sure that she is stable.   2.  Hypertension: Blood pressure is mildly elevated. She also has combined biventricular CHF.  We are not able to use standard GDMT for her HTN or CHF because of her underlying CKD .   It may be difficult to manage her BP until she goes on dialysis .    For questions or updates, please contact [facility] Please consult [email or web address] for contact info under        Signed, Kristeen Miss, MD  10/03/2023, 9:14 AM

## 2023-10-04 DIAGNOSIS — D649 Anemia, unspecified: Secondary | ICD-10-CM | POA: Diagnosis not present

## 2023-10-04 DIAGNOSIS — I5021 Acute systolic (congestive) heart failure: Secondary | ICD-10-CM

## 2023-10-04 DIAGNOSIS — I5043 Acute on chronic combined systolic (congestive) and diastolic (congestive) heart failure: Secondary | ICD-10-CM | POA: Diagnosis not present

## 2023-10-04 DIAGNOSIS — N184 Chronic kidney disease, stage 4 (severe): Secondary | ICD-10-CM | POA: Diagnosis not present

## 2023-10-04 DIAGNOSIS — R0602 Shortness of breath: Secondary | ICD-10-CM | POA: Diagnosis not present

## 2023-10-04 DIAGNOSIS — N179 Acute kidney failure, unspecified: Secondary | ICD-10-CM | POA: Diagnosis not present

## 2023-10-04 DIAGNOSIS — I130 Hypertensive heart and chronic kidney disease with heart failure and stage 1 through stage 4 chronic kidney disease, or unspecified chronic kidney disease: Secondary | ICD-10-CM | POA: Diagnosis not present

## 2023-10-04 LAB — CBC
HCT: 25.5 % — ABNORMAL LOW (ref 36.0–46.0)
Hemoglobin: 7.6 g/dL — ABNORMAL LOW (ref 12.0–15.0)
MCH: 21.2 pg — ABNORMAL LOW (ref 26.0–34.0)
MCHC: 29.8 g/dL — ABNORMAL LOW (ref 30.0–36.0)
MCV: 71 fL — ABNORMAL LOW (ref 80.0–100.0)
Platelets: 187 10*3/uL (ref 150–400)
RBC: 3.59 MIL/uL — ABNORMAL LOW (ref 3.87–5.11)
RDW: 19.8 % — ABNORMAL HIGH (ref 11.5–15.5)
WBC: 5.7 10*3/uL (ref 4.0–10.5)
nRBC: 0 % (ref 0.0–0.2)

## 2023-10-04 LAB — BASIC METABOLIC PANEL
Anion gap: 13 (ref 5–15)
BUN: 67 mg/dL — ABNORMAL HIGH (ref 8–23)
CO2: 22 mmol/L (ref 22–32)
Calcium: 8.7 mg/dL — ABNORMAL LOW (ref 8.9–10.3)
Chloride: 105 mmol/L (ref 98–111)
Creatinine, Ser: 4.73 mg/dL — ABNORMAL HIGH (ref 0.44–1.00)
GFR, Estimated: 10 mL/min — ABNORMAL LOW (ref >60)
Glucose, Bld: 187 mg/dL — ABNORMAL HIGH (ref 70–99)
Potassium: 4.5 mmol/L (ref 3.5–5.1)
Sodium: 140 mmol/L (ref 135–145)

## 2023-10-04 LAB — GLUCOSE, CAPILLARY
Glucose-Capillary: 155 mg/dL — ABNORMAL HIGH (ref 70–99)
Glucose-Capillary: 170 mg/dL — ABNORMAL HIGH (ref 70–99)
Glucose-Capillary: 183 mg/dL — ABNORMAL HIGH (ref 70–99)
Glucose-Capillary: 158 mg/dL — ABNORMAL HIGH (ref 70–99)

## 2023-10-04 LAB — MAGNESIUM: Magnesium: 2.1 mg/dL (ref 1.7–2.4)

## 2023-10-04 MED ORDER — IRON SUCROSE 200 MG IVPB - SIMPLE MED
200.0000 mg | Status: DC
  Administered 2023-10-04: 200 mg via INTRAVENOUS
  Filled 2023-10-04: qty 200
  Filled 2023-10-04: qty 110

## 2023-10-04 MED ORDER — DARBEPOETIN ALFA 60 MCG/0.3ML IJ SOSY
60.0000 ug | PREFILLED_SYRINGE | Freq: Once | INTRAMUSCULAR | Status: AC
  Administered 2023-10-04: 60 ug via SUBCUTANEOUS
  Filled 2023-10-04: qty 0.3

## 2023-10-04 NOTE — Progress Notes (Signed)
 [facility] NEPHROLOGY PROGRESS NOTE  Assessment/ Plan: Pt is a 63 y.o. yo female with AoC CKD4 2/2 AoC HFrEF exacerbation   # AoC CKD4 likely due to cardiorenal syndrome Baseline creatinine 3.5 historically followed by Dr. Malen Gauze at [facility]; subnephrotic. Creatinine slightly worsened in the setting of diuresis for CHF. Renal ultrasound during admission without acute structural issues. UOP stable with 1.4 L out yesterday with change to oral torsemide. - monitor UOP, weight is trending down  - trend renal function, monitor electrolytes replete as needed - torsemide 100 mg daily  - Scr increasing since admit, pending AM BMP today, will discuss with attending regarding dispo - Medication Issues; Preferred narcotic agents for pain control are hydromorphone, fentanyl, and methadone. Morphine should not be used.  Baclofen should be avoided Avoid oral sodium phosphate and magnesium citrate based laxatives / bowel preps   # AoC HFrEF: Volume status improving, now euvolemic. Symptoms improved. Cardiology following. On hydralazine/nitrate # DM2: per primary  # Anemia: Microcytic with TSAT of 7%; started oral iron as inpatient, Hgb low but been stable    Subjective:   UOP of 1425 ml yesterday. Feeling well. Denies SOB and improvement on swelling.   Objective Vital signs in last 24 hours: Vitals:   10/03/23 2024 10/03/23 2026 10/03/23 2128 10/04/23 0852  BP:  134/60 134/60 (!) 152/71  Pulse: 60   (!) 59  Resp: 16   18  Temp: 97.7 F (36.5 C)   98.1 F (36.7 C)  TempSrc: Oral   Oral  SpO2: 93%   93%  Weight:      Height:       Weight change:   Intake/Output Summary (Last 24 hours) at 10/04/2023 0943 Last data filed at 10/03/2023 2135 Gross per 24 hour  Intake 3 ml  Output 1000 ml  Net -997 ml       Labs: RENAL PANEL Recent Labs  Lab 09/27/23 2122 09/28/23 9604 09/29/23 0453 09/30/23 0455 10/01/23 0446 10/02/23 0423 10/03/23 0552 10/04/23 0456  NA 142   < >  142 139 137 136 138  --   K 4.8   < > 4.7 4.4 4.3 4.0 4.0  --   CL 114*   < > 111 113* 109 102 105  --   CO2 19*   < > 20* 20* 21* 21* 21*  --   GLUCOSE 181*   < > 115* 135* 168* 169* 180*  --   BUN 46*   < > 56* 59* 64* 64* 64*  --   CREATININE 3.44*   < > 3.85* 3.84* 4.17* 4.10* 4.32*  --   CALCIUM 8.5*   < > 8.7* 8.2* 8.1* 8.4* 8.5*  --   MG 2.1   < > 2.1 2.1 2.0 2.0 2.1 2.1  ALBUMIN 3.1*  --   --   --   --   --   --   --    < > = values in this interval not displayed.    Liver Function Tests: Recent Labs  Lab 09/27/23 2122  AST 23  ALT 25  ALKPHOS 127*  BILITOT 0.5  PROT 6.8  ALBUMIN 3.1*   No results for input(s): "LIPASE", "AMYLASE" in the last 168 hours. No results for input(s): "AMMONIA" in the last 168 hours. CBC: Recent Labs    08/09/23 1019 09/27/23 2122 09/28/23 0635 09/29/23 0805  HGB 8.2* 8.1* 7.5* 7.9*  MCV 70.5* 71.2* 70.7* 69.9*  TIBC  --   --  330  --   IRON  --   --  23*  --     Cardiac Enzymes: No results for input(s): "CKTOTAL", "CKMB", "CKMBINDEX", "TROPONINI" in the last 168 hours. CBG: Recent Labs  Lab 10/03/23 0809 10/03/23 1154 10/03/23 1630 10/03/23 2157 10/04/23 0850  GLUCAP 170* 162* 137* 155* 155*    Iron Studies: No results for input(s): "IRON", "TIBC", "TRANSFERRIN", "FERRITIN" in the last 72 hours. Studies/Results: No results found.  Medications: Infusions:   Scheduled Medications:  amLODipine  10 mg Oral Daily   atorvastatin  20 mg Oral Daily   ferrous sulfate  325 mg Oral Q breakfast   heparin  5,000 Units Subcutaneous Q8H   hydrALAZINE  100 mg Oral TID   insulin aspart  0-15 Units Subcutaneous TID WC   isosorbide mononitrate  60 mg Oral Daily   sodium chloride flush  3 mL Intravenous Q12H   torsemide  100 mg Oral Daily    have reviewed scheduled and prn medications.  Physical Exam: General:NAD, comfortable in bed Heart:mild bradycardia, regular rhythm   Lungs: normal effort on room air, clear bilaterally   Abdomen:soft, non-tender  Extremities: LE wrapped bilaterally   Rana Snare 10/04/2023,9:43 AM  LOS: 7 days

## 2023-10-04 NOTE — Progress Notes (Signed)
   Heart Failure Stewardship Pharmacist Progress Note   PCP: Olive Bass, FNP PCP-Cardiologist: Chilton Si, MD    HPI:  63 yo F with PMH of CHF, asthma, CKD IV, T2DM, GERD, HTN, anemia, obesity, OSA, and osteoarthritis.   Presented to [facility] cardiology office on 3/3. She was experiencing worsening shortness of breath, orthopnea, PND, and LE edema since October. Symptoms began after there was a lapse in her insurance coverage. Dry weight reported at 250 lbs. Admitted to the hospital for IV diuresis. BNP 1677.7. ECHO 3/4 showed LVEF 35-40% (was 60-65% in 02/2021), global hypokinesis, moderate concentric LVH, G2DD, RV moderately reduced, trivial MR, RA pressure 15. GDMT limited by AKI on CKD IV. Nephrology consulted.   States that she was switched from [facility] commercial insurance to [facility] plan when she retired and this was the issue she was experiencing last fall to be able to get her medications covered. After her new insurance became active and she switched pharmacies to [facility], she has not had any issues obtaining her medications.   Denies shortness of breath. Able to lay flat. No LE edema. UNNA boots placed. Disappointed that she is unable to discharge today with renal dysfunction.   Current HF Medications: Diuretic: torsemide 100 mg daily Other: hydralazine 100 mg TID + Imdur 60 mg daily  Prior to admission HF Medications: Diuretic: furosemide 20 mg BID Beta blocker: carvedilol 6.25 mg BID ACE/ARB/ARNI: losartan 100 mg daily SGLT2i: Farxiga 10 mg daily Other: hydralazine 100 mg TID  Pertinent Lab Values: Serum creatinine 4.32>4.73, BUN 67, Potassium 4.5, Sodium 140, BNP 1677.7, Magnesium 2.1, A1c 7.5   Vital Signs: Weight: 256 lbs (admission weight: 281 lbs) Blood pressure: 150/70s  Heart rate: 50s  I/O: net -1.3L yesterday; net -10.2L since admission  Medication Assistance / Insurance Benefits Check: Does the patient have prescription  insurance?  Yes Type of insurance plan: [place] Medicaid  Outpatient Pharmacy:  Prior to admission outpatient pharmacy: [facility] Is the patient willing to use [facility] TOC pharmacy at discharge? Yes    Assessment: 1. Acute on chronic systolic CHF (LVEF 35-40%). NYHA class II symptoms. - Now off IV lasix, transitioned to torsemide 100 mg daily on 3/9. Strict I/Os and daily weights. Keep K>4 and Mg>2. - Holding carvedilol with further episodes of bradycardia - Holding PTA losartan and Farxiga with AKI on CKD IV - creatinine elevated further today at 4.73 - Continue hydralazine 100 mg TID, Imdur 60 mg daily, and amlodipine 10 mg daily. Could increase Imdur to 90 mg daily tomorrow pending diuretic plans from renal.  Plan: 1) Medication changes recommended at this time: - None  2) Patient assistance: - Has [place] Medicaid  3)  Education  - Patient has been educated on current HF medications and potential additions to HF medication regimen - Patient verbalizes understanding that over the next few months, these medication doses may change and more medications may be added to optimize HF regimen - Patient has been educated on basic disease state pathophysiology and goals of therapy   Sharen Hones, PharmD, BCPS Heart Failure Stewardship Pharmacist Phone [phone]

## 2023-10-04 NOTE — Progress Notes (Signed)
 Mobility Specialist Progress Note;   10/04/23 1140  Mobility  Activity Transferred to/from [facility]  Level of Assistance Contact guard assist, steadying assist  Assistive Device Other (Comment) (HHA)  Distance Ambulated (ft) 5 ft  Activity Response Tolerated well  Mobility Referral Yes  Mobility visit 1 Mobility  Mobility Specialist Start Time (ACUTE ONLY) 1140  Mobility Specialist Stop Time (ACUTE ONLY) 1145  Mobility Specialist Time Calculation (min) (ACUTE ONLY) 5 min   Pts family requesting assistance getting pt from chair to [facility]. Required [facility] assistance via HHA to transfer pt to [facility]. Pt left sitting on BSC with all needs met, told pt to request help back to chair once finished.   Caesar Bookman Mobility Specialist Please contact via SecureChat or [employer] [phone]

## 2023-10-04 NOTE — Progress Notes (Signed)
 Mobility Specialist Progress Note;   10/04/23 1032  Mobility  Activity Transferred from bed to chair  Level of Assistance Contact guard assist, steadying assist  Assistive Device Other (Comment) (HHA)  Distance Ambulated (ft) 5 ft  Activity Response Tolerated well  Mobility Referral Yes  Mobility visit 1 Mobility  Mobility Specialist Start Time (ACUTE ONLY) 1032  Mobility Specialist Stop Time (ACUTE ONLY) 1040  Mobility Specialist Time Calculation (min) (ACUTE ONLY) 8 min   Pt declined ambulation in hallway, however agreeable to sit up in chair. Required light MinG assistance via HHA to safely transfer pt to chair. C/o not getting much rest last night d/t constant coughing. VSS throughout. Pt left comfortably in chair with all needs met, alarm on.   Caesar Bookman Mobility Specialist Please contact via SecureChat or [employer] [phone]

## 2023-10-04 NOTE — Progress Notes (Addendum)
 Rounding Note    Patient Name: Lori Mclaughlin Date of Encounter: 10/04/2023  [facility] Cardiologist: Chilton Si, MD   Subjective   No acute overnight events. She is feeling much better. Breathing is back to baseline and edema is essentially gone. No chest pain.  Inpatient Medications    Scheduled Meds:  amLODipine  10 mg Oral Daily   atorvastatin  20 mg Oral Daily   ferrous sulfate  325 mg Oral Q breakfast   heparin  5,000 Units Subcutaneous Q8H   hydrALAZINE  100 mg Oral TID   insulin aspart  0-15 Units Subcutaneous TID WC   isosorbide mononitrate  60 mg Oral Daily   sodium chloride flush  3 mL Intravenous Q12H   torsemide  100 mg Oral Daily   Continuous Infusions:  PRN Meds: acetaminophen, guaiFENesin-dextromethorphan, ondansetron (ZOFRAN) IV, sodium chloride flush   Vital Signs    Vitals:   10/03/23 2026 10/03/23 2128 10/04/23 0852 10/04/23 1155  BP: 134/60 134/60 (!) 152/71 (!) 158/75  Pulse:   (!) 59 60  Resp:   18 16  Temp:   98.1 F (36.7 C) 98.1 F (36.7 C)  TempSrc:   Oral Oral  SpO2:   93% 93%  Weight:      Height:        Intake/Output Summary (Last 24 hours) at 10/04/2023 1207 Last data filed at 10/04/2023 1159 Gross per 24 hour  Intake 243 ml  Output 800 ml  Net -557 ml      10/03/2023    6:09 AM 10/02/2023    3:24 AM 10/01/2023    4:19 AM  Last 3 Weights  Weight (lbs) 256 lb 4.8 oz 265 lb 6.9 oz 268 lb 14.4 oz  Weight (kg) 116.257 kg 120.4 kg 121.972 kg      Telemetry    Sinus rhythm with rates in the 50s to 60s. Occasional PVCs. - Personally Reviewed  ECG    No new ECG tracing today. - Personally Reviewed  Physical Exam   GEN: Obese African-American female resting comfortably in no acute distress.   Neck: No JVD. Cardiac: RRR. No murmurs, rubs, or gallops.  Respiratory: No increased work of breathing. Decrease breaths sounds in bilateral bases but no wheezes, rhonchi, or rales. GI: Soft, non-distended, and  non-tender. MS: No to trace lower extremity edema bilaterally. Both legs wrapped. No deformity. Neuro:  No focal deficits. Psych: Normal affect. Responds appropriately.  Labs    High Sensitivity Troponin:  No results for input(s): "TROPONINIHS" in the last 720 hours.   Chemistry Recent Labs  Lab 09/27/23 2122 09/28/23 1610 10/02/23 0423 10/03/23 0552 10/04/23 0456 10/04/23 0918  NA 142   < > 136 138  --  140  K 4.8   < > 4.0 4.0  --  4.5  CL 114*   < > 102 105  --  105  CO2 19*   < > 21* 21*  --  22  GLUCOSE 181*   < > 169* 180*  --  187*  BUN 46*   < > 64* 64*  --  67*  CREATININE 3.44*   < > 4.10* 4.32*  --  4.73*  CALCIUM 8.5*   < > 8.4* 8.5*  --  8.7*  MG 2.1   < > 2.0 2.1 2.1  --   PROT 6.8  --   --   --   --   --   ALBUMIN 3.1*  --   --   --   --   --  AST 23  --   --   --   --   --   ALT 25  --   --   --   --   --   ALKPHOS 127*  --   --   --   --   --   BILITOT 0.5  --   --   --   --   --   GFRNONAA 14*   < > 12* 11*  --  10*  ANIONGAP 9   < > 13 12  --  13   < > = values in this interval not displayed.    Lipids  Recent Labs  Lab 09/29/23 0453  CHOL 89  TRIG 46  HDL 46  LDLCALC 34  CHOLHDL 1.9    Hematology Recent Labs  Lab 09/28/23 0635 09/29/23 0805 10/04/23 0918  WBC 5.4 5.9 5.7  RBC 3.48* 3.69* 3.59*  HGB 7.5* 7.9* 7.6*  HCT 24.6* 25.8* 25.5*  MCV 70.7* 69.9* 71.0*  MCH 21.6* 21.4* 21.2*  MCHC 30.5 30.6 29.8*  RDW 20.2* 19.9* 19.8*  PLT 168 183 187   Thyroid No results for input(s): "TSH", "FREET4" in the last 168 hours.  BNP Recent Labs  Lab 09/27/23 2122  BNP 1,677.7*    DDimer No results for input(s): "DDIMER" in the last 168 hours.   Radiology    No results found.  Cardiac Studies   Echocardiogram 09/28/2023: Impressions: 1. Left ventricular ejection fraction, by estimation, is 35 to 40%. The  left ventricle has moderately decreased function. The left ventricle  demonstrates global hypokinesis. There is moderate  concentric left  ventricular hypertrophy. Left ventricular  diastolic parameters are consistent with Grade II diastolic dysfunction  (pseudonormalization).   2. Right ventricular systolic function is moderately reduced. The right  ventricular size is moderately enlarged. Tricuspid regurgitation signal is  inadequate for assessing PA pressure.   3. Left atrial size was mildly dilated.   4. The mitral valve is grossly normal. Trivial mitral valve  regurgitation. No evidence of mitral stenosis.   5. The aortic valve is tricuspid. Aortic valve regurgitation is not  visualized. No aortic stenosis is present.   6. The inferior vena cava is dilated in size with <50% respiratory  variability, suggesting right atrial pressure of 15 mmHg.   Comparison(s): LVEF now moderately reduced 35-40% with global HK. Moderate  RV dilation with dysfunction.    Patient Profile     63 y.o. female with a history of chronic HF[EF, hypertension,  hyperlipidemia, type 2 diabetes mellitus, CKD stage IV, asthma, GERD, anemia, and obstructive sleep apnea with nocturnal hypoxemia who was direct admitted from our office on 09/27/2023 for acute on chronic CHF due to worsening shortness of breath and edema.  Assessment & Plan    Acute on Chronic HFrEF Patient direct admitted from the office for CHF exacerbation as above. BNP 1,677. Echo showed reduced EF of 35-40% with global hypokinesis and grade 2 diastolic dysfunction as well as moderately reduced RV function. She has been diuresed with IV Lasix with the assistance of Nephrology. Net negative 10.2 L this admission. Weight 256 lbs today (down from 281 lbs on admission). Creatinine has been slowly trending up. - Looks euvolemic on exam. - IV Lasix was stopped yesterday and he was switched to PO Torsemide 100mg  daily per Nephrology. - No ACEi/ARB/ARNI, MRA, or SGLT2 inhibitor given renal function. - Continue to monitor daily weights, strict I/Os, and renal function  closely. - Consider  outpatient cardiac PET stress test for further evaluation of drop in EF given CKD stage IV.  Hypertension BP reasonably well controlled.  - Continue Amlodipine 10mg  daily.  - Continue Hydralazine 100mg  three times daily.  - Continue Imdur 60mg  daily.  - Coreg was stopped due to bradycardia.  Hyperlipidemia Lipid panel this admission: Total Cholesterol 89, Triglycerides 46, HDL 46, LDL 34.  - Continue Lipitor 20mg  daily.  Type 2 Diabetes Mellitus Hemoglobin A1c 7.5% this admission. - Home Farxiga held on admission. - Continue sliding scale insulin.  Acute on CKD Stage IV Creatinine 3.44 on admission which is around baseline. However, creatinine has been slowly increasing with diuresis. Felt to likely be due to cardiorenal syndrome. Renal ultrasound showed possible non-obstructing right renal calculus but otherwise unremarkable. - Creatinine up to 4.32 yesterday. Today's labs pending.  - Nephrology following.   Microcytic Anemia Hemoglobin as low as 7.5 this admission. Baseline in the low 8s. Iron low at 23. - He was started oral iron supplement this admission. Continue. - Will repeat CBC today.  Disposition: Waiting on today's labs. Per Nephrology's note from yesterday, may be able to be discharged today if renal function stable.   For questions or updates, please contact [facility] Please consult [email or web address] for contact info under     Signed, Thomasene Ripple, DO  10/04/2023, 12:07 PM    Patient seen and examined, note reviewed with the signed Advanced Practice Provider. I personally reviewed laboratory data, imaging studies and relevant notes. I independently examined the patient and formulated the important aspects of the plan. I have personally discussed the plan with the patient and/or family. Comments or changes to the note/plan are indicated below.  Patient seen and examined at her bedside.  She was sitting up in a chair inquiring about  discharge.  Acute on chronic heart failure with reduced ejection fraction Chronic kidney disease stage IV Hypertension Hyperlipidemia Type 2 diabetes  She has been tolerating the torsemide 100 mg she is making some urine.  The biggest problem here is the fact that her creatinine continued to trend up today is 4.73.  I am really hoping to get some direction from our nephrology colleagues giving her kidney function.  She has shown some improvement today she looks euvolemic on exam.  Most of what I am concerned about is planning for the outpatient setting as she is nearing discharge.  In terms of her depressed ejection fraction will consider ischemic evaluation in the outpatient setting with a PET scan.  Her blood pressure is elevated this morning she has been controlled with amlodipine 10 mg daily, hydralazine 100 mg 3 times daily.  And she is on Imdur 60 mg daily.  Discontinue to stay off I will increase her Imdur to 90 mg.  Continue scale for now for diabetes mellitus.   Thomasene Ripple DO, MS [facility] Attending Cardiologist [facility]  [address] [place], [place] [address] [phone] Website: [email or web address]

## 2023-10-05 ENCOUNTER — Other Ambulatory Visit (HOSPITAL_BASED_OUTPATIENT_CLINIC_OR_DEPARTMENT_OTHER): Payer: Self-pay

## 2023-10-05 ENCOUNTER — Other Ambulatory Visit (HOSPITAL_COMMUNITY): Payer: Self-pay

## 2023-10-05 DIAGNOSIS — D649 Anemia, unspecified: Secondary | ICD-10-CM | POA: Diagnosis not present

## 2023-10-05 DIAGNOSIS — I5023 Acute on chronic systolic (congestive) heart failure: Principal | ICD-10-CM

## 2023-10-05 DIAGNOSIS — N184 Chronic kidney disease, stage 4 (severe): Secondary | ICD-10-CM | POA: Diagnosis not present

## 2023-10-05 DIAGNOSIS — I130 Hypertensive heart and chronic kidney disease with heart failure and stage 1 through stage 4 chronic kidney disease, or unspecified chronic kidney disease: Secondary | ICD-10-CM | POA: Diagnosis not present

## 2023-10-05 DIAGNOSIS — I5043 Acute on chronic combined systolic (congestive) and diastolic (congestive) heart failure: Secondary | ICD-10-CM | POA: Diagnosis not present

## 2023-10-05 DIAGNOSIS — I5021 Acute systolic (congestive) heart failure: Secondary | ICD-10-CM | POA: Diagnosis not present

## 2023-10-05 DIAGNOSIS — Z794 Long term (current) use of insulin: Secondary | ICD-10-CM

## 2023-10-05 DIAGNOSIS — N179 Acute kidney failure, unspecified: Secondary | ICD-10-CM | POA: Diagnosis not present

## 2023-10-05 DIAGNOSIS — R0602 Shortness of breath: Secondary | ICD-10-CM | POA: Diagnosis not present

## 2023-10-05 DIAGNOSIS — D509 Iron deficiency anemia, unspecified: Secondary | ICD-10-CM | POA: Insufficient documentation

## 2023-10-05 LAB — CBC
HCT: 24.8 % — ABNORMAL LOW (ref 36.0–46.0)
Hemoglobin: 7.6 g/dL — ABNORMAL LOW (ref 12.0–15.0)
MCH: 21.5 pg — ABNORMAL LOW (ref 26.0–34.0)
MCHC: 30.6 g/dL (ref 30.0–36.0)
MCV: 70.1 fL — ABNORMAL LOW (ref 80.0–100.0)
Platelets: 186 10*3/uL (ref 150–400)
RBC: 3.54 MIL/uL — ABNORMAL LOW (ref 3.87–5.11)
RDW: 19.7 % — ABNORMAL HIGH (ref 11.5–15.5)
WBC: 6.4 10*3/uL (ref 4.0–10.5)
nRBC: 0 % (ref 0.0–0.2)

## 2023-10-05 LAB — RENAL FUNCTION PANEL
Albumin: 3 g/dL — ABNORMAL LOW (ref 3.5–5.0)
Anion gap: 5 (ref 5–15)
BUN: 65 mg/dL — ABNORMAL HIGH (ref 8–23)
CO2: 22 mmol/L (ref 22–32)
Calcium: 7.8 mg/dL — ABNORMAL LOW (ref 8.9–10.3)
Chloride: 108 mmol/L (ref 98–111)
Creatinine, Ser: 4.67 mg/dL — ABNORMAL HIGH (ref 0.44–1.00)
GFR, Estimated: 10 mL/min — ABNORMAL LOW (ref >60)
Glucose, Bld: 127 mg/dL — ABNORMAL HIGH (ref 70–99)
Phosphorus: 5.5 mg/dL — ABNORMAL HIGH (ref 2.5–4.6)
Potassium: 4 mmol/L (ref 3.5–5.1)
Sodium: 135 mmol/L (ref 135–145)

## 2023-10-05 LAB — GLUCOSE, CAPILLARY
Glucose-Capillary: 118 mg/dL — ABNORMAL HIGH (ref 70–99)
Glucose-Capillary: 204 mg/dL — ABNORMAL HIGH (ref 70–99)

## 2023-10-05 MED ORDER — FUROSEMIDE 40 MG PO TABS
40.0000 mg | ORAL_TABLET | Freq: Two times a day (BID) | ORAL | 2 refills | Status: DC
  Filled 2023-10-05 (×2): qty 60, 30d supply, fill #0

## 2023-10-05 MED ORDER — NOVOLOG FLEXPEN 100 UNIT/ML ~~LOC~~ SOPN: PEN_INJECTOR | SUBCUTANEOUS | Status: DC

## 2023-10-05 MED ORDER — ISOSORBIDE MONONITRATE ER 30 MG PO TB24
90.0000 mg | ORAL_TABLET | Freq: Every day | ORAL | 2 refills | Status: DC
Start: 2023-10-06 — End: 2023-10-25
  Filled 2023-10-05: qty 30, 10d supply, fill #0

## 2023-10-05 MED ORDER — FERROUS SULFATE 325 (65 FE) MG PO TABS
325.0000 mg | ORAL_TABLET | Freq: Every day | ORAL | 1 refills | Status: DC
Start: 2023-10-06 — End: 2023-12-29
  Filled 2023-10-05: qty 30, 30d supply, fill #0
  Filled 2023-11-06: qty 30, 30d supply, fill #1

## 2023-10-05 MED ORDER — ISOSORBIDE MONONITRATE ER 60 MG PO TB24
90.0000 mg | ORAL_TABLET | Freq: Every day | ORAL | Status: DC
  Administered 2023-10-05: 90 mg via ORAL
  Filled 2023-10-05: qty 1

## 2023-10-05 MED ORDER — SODIUM CHLORIDE 0.9 % IV SOLN
200.0000 mg | INTRAVENOUS | Status: DC
  Administered 2023-10-05: 200 mg via INTRAVENOUS
  Filled 2023-10-05: qty 10

## 2023-10-05 NOTE — Progress Notes (Addendum)
 Rounding Note    Patient Name: Lori Mclaughlin Date of Encounter: 10/05/2023  [facility] Cardiologist: Chilton Si, MD   Subjective   No significant overnight events. No complaints this morning. She denies any chest pain, shortness of breath, lightheadedness/ dizziness. Edema has essentially resolved. Creatinine stable this morning at 4.67.  Inpatient Medications    Scheduled Meds:  amLODipine  10 mg Oral Daily   atorvastatin  20 mg Oral Daily   ferrous sulfate  325 mg Oral Q breakfast   heparin  5,000 Units Subcutaneous Q8H   hydrALAZINE  100 mg Oral TID   insulin aspart  0-15 Units Subcutaneous TID WC   isosorbide mononitrate  90 mg Oral Daily   sodium chloride flush  3 mL Intravenous Q12H   Continuous Infusions:  iron sucrose     PRN Meds: acetaminophen, guaiFENesin-dextromethorphan, ondansetron (ZOFRAN) IV, sodium chloride flush   Vital Signs    Vitals:   10/04/23 1656 10/04/23 2002 10/05/23 0545 10/05/23 0817  BP: (!) 148/70 (!) 141/86 (!) 136/56   Pulse: (!) 59 (!) 55 60 (!) 59  Resp: 16 17 16 16   Temp:  97.9 F (36.6 C) 98.2 F (36.8 C) 98.2 F (36.8 C)  TempSrc:  Oral Oral Oral  SpO2: 92% 97% 95% 92%  Weight:   117.8 kg   Height:        Intake/Output Summary (Last 24 hours) at 10/05/2023 1123 Last data filed at 10/05/2023 0900 Gross per 24 hour  Intake 946.3 ml  Output 1700 ml  Net -753.7 ml      10/05/2023    5:45 AM 10/03/2023    6:09 AM 10/02/2023    3:24 AM  Last 3 Weights  Weight (lbs) 259 lb 11.2 oz 256 lb 4.8 oz 265 lb 6.9 oz  Weight (kg) 117.799 kg 116.257 kg 120.4 kg      Telemetry    Sinus rhythm with rates in the 50s to 60s. - Personally Reviewed  ECG    No new ECG tracing today. - Personally Reviewed  Physical Exam   GEN: Obese African-American female resting comfortably in no acute distress.   Neck: No JVD. Cardiac: RRR. No murmurs, rubs, or gallops.  Respiratory: No increased work of breathing.  Slight decreased breath sounds in bilateral bases. Otherwise, clear to auscultation bilaterally. No wheezes, rhonchi, or rales. MS: Trace lower extremity edema bilaterally. Bilateral legs wrapped. No deformity. Skin: Warm and dry. Neuro:  No focal deficits. Psych: Normal affect. Responds appropriately.   Labs    High Sensitivity Troponin:  No results for input(s): "TROPONINIHS" in the last 720 hours.   Chemistry Recent Labs  Lab 10/02/23 0423 10/03/23 0552 10/04/23 0456 10/04/23 0918 10/05/23 0521  NA 136 138  --  140 135  K 4.0 4.0  --  4.5 4.0  CL 102 105  --  105 108  CO2 21* 21*  --  22 22  GLUCOSE 169* 180*  --  187* 127*  BUN 64* 64*  --  67* 65*  CREATININE 4.10* 4.32*  --  4.73* 4.67*  CALCIUM 8.4* 8.5*  --  8.7* 7.8*  MG 2.0 2.1 2.1  --   --   ALBUMIN  --   --   --   --  3.0*  GFRNONAA 12* 11*  --  10* 10*  ANIONGAP 13 12  --  13 5    Lipids  Recent Labs  Lab 09/29/23 0453  CHOL 89  TRIG 46  HDL 46  LDLCALC 34  CHOLHDL 1.9    Hematology Recent Labs  Lab 09/29/23 0805 10/04/23 0918 10/05/23 0521  WBC 5.9 5.7 6.4  RBC 3.69* 3.59* 3.54*  HGB 7.9* 7.6* 7.6*  HCT 25.8* 25.5* 24.8*  MCV 69.9* 71.0* 70.1*  MCH 21.4* 21.2* 21.5*  MCHC 30.6 29.8* 30.6  RDW 19.9* 19.8* 19.7*  PLT 183 187 186   Thyroid No results for input(s): "TSH", "FREET4" in the last 168 hours.  BNPNo results for input(s): "BNP", "PROBNP" in the last 168 hours.  DDimer No results for input(s): "DDIMER" in the last 168 hours.   Radiology    No results found.  Cardiac Studies   Echocardiogram 09/28/2023: Impressions: 1. Left ventricular ejection fraction, by estimation, is 35 to 40%. The  left ventricle has moderately decreased function. The left ventricle  demonstrates global hypokinesis. There is moderate concentric left  ventricular hypertrophy. Left ventricular  diastolic parameters are consistent with Grade II diastolic dysfunction  (pseudonormalization).   2. Right  ventricular systolic function is moderately reduced. The right  ventricular size is moderately enlarged. Tricuspid regurgitation signal is  inadequate for assessing PA pressure.   3. Left atrial size was mildly dilated.   4. The mitral valve is grossly normal. Trivial mitral valve  regurgitation. No evidence of mitral stenosis.   5. The aortic valve is tricuspid. Aortic valve regurgitation is not  visualized. No aortic stenosis is present.   6. The inferior vena cava is dilated in size with <50% respiratory  variability, suggesting right atrial pressure of 15 mmHg.   Comparison(s): LVEF now moderately reduced 35-40% with global HK. Moderate  RV dilation with dysfunction.   Patient Profile     63 y.o. female with a history of chronic HF[EF, hypertension,  hyperlipidemia, type 2 diabetes mellitus, CKD stage IV, asthma, GERD, anemia, and obstructive sleep apnea with nocturnal hypoxemia who was direct admitted from our office on 09/27/2023 for acute on chronic CHF due to worsening shortness of breath and edema.   Assessment & Plan    Acute on Chronic HFrEF Patient direct admitted from the office for CHF exacerbation as above. BNP 1,677. Echo showed reduced EF of 35-40% with global hypokinesis and grade 2 diastolic dysfunction as well as moderately reduced RV function. She has been diuresed with IV Lasix with the assistance of Nephrology. Net negative 11 L this admission. Weight 259 lbs today (down from 281 lbs on admission). Creatinine stable today. - Looks euvolemic on exam. - Patient was transitioned to Torsemide yesterday but this has been held due to renal function. Will defer diuretic regimen to Nephrology.  - No ACEi/ARB/ARNI, MRA, or SGLT2 inhibitor given renal function. - Continue to monitor daily weights, strict I/Os, and renal function closely. - Consider outpatient cardiac PET stress test for further evaluation of drop in EF given CKD stage IV.   Hypertension BP mildly elevated. -  Continue Amlodipine 10mg  daily.  - Continue Hydralazine 100mg  three times daily.  - Will increase Imdur to 90mg  daily.  - Coreg was stopped due to bradycardia.   Hyperlipidemia Lipid panel this admission: Total Cholesterol 89, Triglycerides 46, HDL 46, LDL 34.  - Continue Lipitor 20mg  daily.   Type 2 Diabetes Mellitus Hemoglobin A1c 7.5% this admission. - Home Farxiga held on admission. - Continue sliding scale insulin.   Acute on CKD Stage IV Creatinine 3.44 on admission which is around baseline. However, creatinine has been slowly increasing with diuresis and peaked at  4.73 on 3/10. Felt to likely be due to cardiorenal syndrome. Renal ultrasound showed possible non-obstructing right renal calculus but otherwise unremarkable. - Creatinine up to 4.67 today. - Nephrology following.    Microcytic Anemia Hemoglobin as low as 7.5 this admission. Baseline in the low 8s. Iron low at 23. Felt to likely be anemia of chronic disease. - Hemoglobin stable at 7.6 today. - He was treated with a dose of IV iron yesterday and plan is for another dose today. She was also treated with Aranesp yesterday. - Management per Nephrology.  For questions or updates, please contact [facility] Please consult [email or web address] for contact info under  Signed, Thomasene Ripple, DO  10/05/2023, 11:23 AM    Patient seen and examined, note reviewed with the signed Advanced Practice Provider. I personally reviewed laboratory data, imaging studies and relevant notes. I independently examined the patient and formulated the important aspects of the plan. I have personally discussed the plan with the patient and/or family. Comments or changes to the note/plan are indicated below.  Clinically improved - appears euvolemic and blood pressure improved.  Appreciate Nephro on Iron therapy.  I would like to discharge her today - on Lasix 40 mg BID. Would appreciate nephro recs as well.  Cr improving - she will require fu  labs in a week post hospitalization.    Thomasene Ripple DO, MS [facility] Attending Cardiologist [facility]  [address] [place], [place] [address] [phone] Website: [email or web address]

## 2023-10-05 NOTE — Progress Notes (Signed)
 [facility] NEPHROLOGY PROGRESS NOTE  Assessment/ Plan: Pt is a 63 y.o. yo female with AoC CKD4 2/2 AoC HFrEF exacerbation   # AoC CKD4 likely due to cardiorenal syndrome Baseline creatinine 3.5 historically followed by Dr. Malen Gauze at [facility]; subnephrotic. Creatinine slightly worsened in the setting of diuresis for CHF. Renal ultrasound during admission without acute structural issues. UOP stable with 1.7 L out yesterday with oral torsemide. D/c torsemide yesterday.  - monitor UOP, daily weights - trend renal function - PTA lasix 20 mg BID, f/u Scr improved then restart po diuretic  - Medication Issues; Preferred narcotic agents for pain control are hydromorphone, fentanyl, and methadone. Morphine should not be used.  Baclofen should be avoided Avoid oral sodium phosphate and magnesium citrate based laxatives / bowel preps   # AoC HFrEF: Now euvolemic. Symptoms improved. Cardiology following. On hydralazine/nitrate # DM2: per primary  # Anemia: Microcytic with TSAT of 7%; Hgb low but been stable, getting IV iron   Subjective:   Felling well and symptoms improved. Leg swelling improved. Denies any new concerns.   Objective Vital signs in last 24 hours: Vitals:   10/04/23 1656 10/04/23 2002 10/05/23 0545 10/05/23 0817  BP: (!) 148/70 (!) 141/86 (!) 136/56   Pulse: (!) 59 (!) 55 60 (!) 59  Resp: 16 17 16 16   Temp:  97.9 F (36.6 C) 98.2 F (36.8 C) 98.2 F (36.8 C)  TempSrc:  Oral Oral Oral  SpO2: 92% 97% 95% 92%  Weight:   117.8 kg   Height:       Weight change:   Intake/Output Summary (Last 24 hours) at 10/05/2023 0914 Last data filed at 10/05/2023 0550 Gross per 24 hour  Intake 706.3 ml  Output 1700 ml  Net -993.7 ml       Labs: RENAL PANEL Recent Labs  Lab 09/30/23 0455 10/01/23 0446 10/02/23 0423 10/03/23 0552 10/04/23 0456 10/04/23 0918 10/05/23 0521  NA 139 137 136 138  --  140 135  K 4.4 4.3 4.0 4.0  --  4.5 4.0  CL 113* 109 102 105  --   105 108  CO2 20* 21* 21* 21*  --  22 22  GLUCOSE 135* 168* 169* 180*  --  187* 127*  BUN 59* 64* 64* 64*  --  67* 65*  CREATININE 3.84* 4.17* 4.10* 4.32*  --  4.73* 4.67*  CALCIUM 8.2* 8.1* 8.4* 8.5*  --  8.7* 7.8*  MG 2.1 2.0 2.0 2.1 2.1  --   --   PHOS  --   --   --   --   --   --  5.5*  ALBUMIN  --   --   --   --   --   --  3.0*    Liver Function Tests: Recent Labs  Lab 10/05/23 0521  ALBUMIN 3.0*   No results for input(s): "LIPASE", "AMYLASE" in the last 168 hours. No results for input(s): "AMMONIA" in the last 168 hours. CBC: Recent Labs    09/27/23 2122 09/28/23 0635 09/29/23 0805 10/04/23 0918 10/05/23 0521  HGB 8.1* 7.5* 7.9* 7.6* 7.6*  MCV 71.2* 70.7* 69.9* 71.0* 70.1*  TIBC  --  330  --   --   --   IRON  --  23*  --   --   --     Cardiac Enzymes: No results for input(s): "CKTOTAL", "CKMB", "CKMBINDEX", "TROPONINI" in the last 168 hours. CBG: Recent Labs  Lab 10/04/23 0850 10/04/23  1157 10/04/23 1652 10/04/23 2115 10/05/23 0823  GLUCAP 155* 170* 183* 158* 118*    Iron Studies: No results for input(s): "IRON", "TIBC", "TRANSFERRIN", "FERRITIN" in the last 72 hours. Studies/Results: No results found.  Medications: Infusions:  iron sucrose      Scheduled Medications:  amLODipine  10 mg Oral Daily   atorvastatin  20 mg Oral Daily   ferrous sulfate  325 mg Oral Q breakfast   heparin  5,000 Units Subcutaneous Q8H   hydrALAZINE  100 mg Oral TID   insulin aspart  0-15 Units Subcutaneous TID WC   isosorbide mononitrate  90 mg Oral Daily   sodium chloride flush  3 mL Intravenous Q12H    have reviewed scheduled and prn medications.  Physical Exam: General:NAD, comfortable in bed Heart:mild bradycardia, regular rhythm   Lungs: normal effort on room air, clear bilaterally  Abdomen:bowel sounds present, soft, non-tender  Extremities: improved LE edema, LE wrapped bilaterally   Rana Snare 10/05/2023,9:14 AM  LOS: 8 days

## 2023-10-05 NOTE — Discharge Summary (Addendum)
 Discharge Summary    Patient ID: Lori Mclaughlin MRN: [id number]; DOB: 12-08-1960  Admit date: 09/27/2023 Discharge date: 10/05/2023  PCP:  Olive Bass, FNP   [facility] Providers Cardiologist:  Chilton Si, MD   Discharge Diagnoses    Principal Problem:   Acute on chronic HFrEF (heart failure with reduced ejection fraction) (HCC) Active Problems:   Essential hypertension   Hyperlipidemia with target LDL less than 70   AKI (acute kidney injury) (HCC)   Type 2 diabetes mellitus with complication, with long-term current use of insulin (HCC)   Chronic kidney disease (CKD), stage IV (severe) (HCC)   Microcytic anemia    Diagnostic Studies/Procedures    Echocardiogram 09/26/2023: Impressions: 1. Left ventricular ejection fraction, by estimation, is 35 to 40%. The  left ventricle has moderately decreased function. The left ventricle  demonstrates global hypokinesis. There is moderate concentric left  ventricular hypertrophy. Left ventricular  diastolic parameters are consistent with Grade II diastolic dysfunction  (pseudonormalization).   2. Right ventricular systolic function is moderately reduced. The right  ventricular size is moderately enlarged. Tricuspid regurgitation signal is  inadequate for assessing PA pressure.   3. Left atrial size was mildly dilated.   4. The mitral valve is grossly normal. Trivial mitral valve  regurgitation. No evidence of mitral stenosis.   5. The aortic valve is tricuspid. Aortic valve regurgitation is not  visualized. No aortic stenosis is present.   6. The inferior vena cava is dilated in size with <50% respiratory  variability, suggesting right atrial pressure of 15 mmHg.   Comparison(s): LVEF now moderately reduced 35-40% with global HK. Moderate  RV dilation with dysfunction.   _____________   History of Present Illness     Lori Mclaughlin is a 63 y.o. female with a history of chronic HFpEF,  hypertension, hyperlipidemia, type 2 diabetes mellitus, CKD stage IV, asthma, GERD, anemia, and obstructive sleep apnea with nocturnal hypoxemia who was direct admitted from our office on 09/27/2023 for acute on chronic CHF due to worsening shortness of breath and edema.   Hospital Course     Consultants: Nephrology   Acute on Chronic HFrEF Patient direct admitted from the office for CHF exacerbation as above. BNP 1,677. Echo showed reduced EF of 35-40% with global hypokinesis and grade 2 diastolic dysfunction as well as moderately reduced RV function. She was started on IV Lasix but had worsening renal function so Nephrology was consulted to assist with diuresis. Net negative 11 L this admission. Discharge weight 259 lbs today (down from 281 lbs on admission). Nephrology recommended discharging on Lasix 40mg  twice daily (previously on 20mg  twice daily). No ACEi/ARB/ARNI or MRA given renal function. Will continue to hold home Farxiga for this reason as well. Discussed importance of daily weights and sodium/ fluid restrictions after discharge. Unclear what has caused drop in EF. Consider outpatient cardiac PET stress test for further evaluation given renal function.   Hypertension BP mildly elevated at times during admission.  Home Losartan stopped due to renal function and Coreg stopped due to bradycardia. She was started on Imdur 90mg  daily. Continue Hydralazine 100mg  three times daily and Amlodipine 10mg  daily.   Hyperlipidemia Lipid panel this admission: Total Cholesterol 89, Triglycerides 46, HDL 46, LDL 34. Continue Lipitor 20mg  daily.   Type 2 Diabetes Mellitus Hemoglobin A1c 7.5% this admission. Resume home insulin regimen at discharge. Will continue to hold Farxiga for now given renal function.   Acute on CKD Stage IV Creatinine  was 3.44 on admission which is around baseline. However, creatinine continued to slowly increase with diuresis. Nephrology was consulted and this was felt to be due  to cardiorenal syndrome. Renal ultrasound showed possible non-obstructive right renal calculus but otherwise unremarkable. Creatinine peaked at 4.73 on 3/10 and stable at 4.67 on discharge. Recommend repeat BMET at follow-up visit next week. Follow-up with Nephrology as an outpatient.   Microcytic Anemia Hemoglobin as low as 7.5 this admission. Baseline in the low 8s. Iron low at 23. Felt to likely be anemia of chronic disease. She was treated with IV iron and Arasep during admission. Continue oral iron supplement on discharge. Recommend repeat CBC at follow-up next week.  Patient was seen and examined by Dr. Servando Salina today and felt to be stable for discharge. Outpatient follow-up arranged. Medications as below.  Did the patient have an acute coronary syndrome (MI, NSTEMI, STEMI, etc) this admission?:  No                               Did the patient have a percutaneous coronary intervention (stent / angioplasty)?:  No.    _____________  Discharge Vitals Blood pressure (!) 143/57, pulse (!) 57, temperature 98.5 F (36.9 C), temperature source Oral, resp. rate 16, height 5' (1.524 m), weight 117.8 kg, last menstrual period 02/23/2012, SpO2 92%.  Filed Weights   10/02/23 0324 10/03/23 0609 10/05/23 0545  Weight: 120.4 kg 116.3 kg 117.8 kg    Labs & Radiologic Studies    CBC Recent Labs    10/04/23 0918 10/05/23 0521  WBC 5.7 6.4  HGB 7.6* 7.6*  HCT 25.5* 24.8*  MCV 71.0* 70.1*  PLT 187 186   Basic Metabolic Panel Recent Labs    16/10/96 0552 10/04/23 0456 10/04/23 0918 10/05/23 0521  NA 138  --  140 135  K 4.0  --  4.5 4.0  CL 105  --  105 108  CO2 21*  --  22 22  GLUCOSE 180*  --  187* 127*  BUN 64*  --  67* 65*  CREATININE 4.32*  --  4.73* 4.67*  CALCIUM 8.5*  --  8.7* 7.8*  MG 2.1 2.1  --   --   PHOS  --   --   --  5.5*   Liver Function Tests Recent Labs    10/05/23 0521  ALBUMIN 3.0*   No results for input(s): "LIPASE", "AMYLASE" in the last 72 hours. High  Sensitivity Troponin:   No results for input(s): "TROPONINIHS" in the last 720 hours.  BNP Invalid input(s): "POCBNP" D-Dimer No results for input(s): "DDIMER" in the last 72 hours. Hemoglobin A1C No results for input(s): "HGBA1C" in the last 72 hours. Fasting Lipid Panel No results for input(s): "CHOL", "HDL", "LDLCALC", "TRIG", "CHOLHDL", "LDLDIRECT" in the last 72 hours. Thyroid Function Tests No results for input(s): "TSH", "T4TOTAL", "T3FREE", "THYROIDAB" in the last 72 hours.  Invalid input(s): "FREET3" _____________  US RENAL Result Date: 10/01/2023 CLINICAL DATA:  Acute kidney injury. EXAM: RENAL / URINARY TRACT ULTRASOUND COMPLETE COMPARISON:  None Available. FINDINGS: Right Kidney: Renal measurements: 9.7 cm x 4.3 cm x 4.1 cm = volume: 89.93 mL. Echogenicity within normal limits. No mass or hydronephrosis visualized. A 1.0 cm shadowing echogenic focus is seen within the mid right kidney on the sagittal view, however, this could not be duplicated on the transverse view (as stated by the ultrasound technologist). Left Kidney: Renal measurements: 8.7  cm x 5.5 cm x 4.2 cm = volume: 106.52 mL. Echogenicity within normal limits. No mass or hydronephrosis visualized. Bladder: Appears normal for degree of bladder distention. Other: None. IMPRESSION: Findings, as described above, which may represent a nonobstructing right renal calculus. Electronically Signed   By: Aram Candela M.D.   On: 10/01/2023 00:00   ECHOCARDIOGRAM COMPLETE Result Date: 09/28/2023    ECHOCARDIOGRAM REPORT   Patient Name:   MALLOREY ODONELL Date of Exam: 09/28/2023 Medical Rec #:  [id number]              Height:       60.0 in Accession #:    [id number]             Weight:       278.1 lb Date of Birth:  26-May-1961              BSA:          2.147 m Patient Age:    62 years               BP:           142/62 mmHg Patient Gender: F                      HR:           48 bpm. Exam Location:  Inpatient Procedure: 2D Echo,  Cardiac Doppler, Color Doppler and Intracardiac            Opacification Agent (Both Spectral and Color Flow Doppler were            utilized during procedure). Indications:    CHF-Acute Diastolic I50.31  History:        Patient has prior history of Echocardiogram examinations, most                 recent 03/08/2021. CHF; Risk Factors:Sleep Apnea, Hypertension                 and Diabetes.  Sonographer:    Darlys Gales Referring Phys: [id number] Cyndi Bender IMPRESSIONS  1. Left ventricular ejection fraction, by estimation, is 35 to 40%. The left ventricle has moderately decreased function. The left ventricle demonstrates global hypokinesis. There is moderate concentric left ventricular hypertrophy. Left ventricular diastolic parameters are consistent with Grade II diastolic dysfunction (pseudonormalization).  2. Right ventricular systolic function is moderately reduced. The right ventricular size is moderately enlarged. Tricuspid regurgitation signal is inadequate for assessing PA pressure.  3. Left atrial size was mildly dilated.  4. The mitral valve is grossly normal. Trivial mitral valve regurgitation. No evidence of mitral stenosis.  5. The aortic valve is tricuspid. Aortic valve regurgitation is not visualized. No aortic stenosis is present.  6. The inferior vena cava is dilated in size with <50% respiratory variability, suggesting right atrial pressure of 15 mmHg. Comparison(s): LVEF now moderately reduced 35-40% with global HK. Moderate RV dilation with dysfunction. FINDINGS  Left Ventricle: Left ventricular ejection fraction, by estimation, is 35 to 40%. The left ventricle has moderately decreased function. The left ventricle demonstrates global hypokinesis. The left ventricular internal cavity size was normal in size. There is moderate concentric left ventricular hypertrophy. Left ventricular diastolic parameters are consistent with Grade II diastolic dysfunction (pseudonormalization). Right Ventricle: The right  ventricular size is moderately enlarged. No increase in right ventricular wall thickness. Right ventricular systolic function is moderately reduced. Tricuspid regurgitation signal is inadequate for assessing PA pressure. Left  Atrium: Left atrial size was mildly dilated. Right Atrium: Right atrial size was not well visualized. Pericardium: There is no evidence of pericardial effusion. Mitral Valve: The mitral valve is grossly normal. Trivial mitral valve regurgitation. No evidence of mitral valve stenosis. Tricuspid Valve: The tricuspid valve is grossly normal. Tricuspid valve regurgitation is mild . No evidence of tricuspid stenosis. Aortic Valve: The aortic valve is tricuspid. Aortic valve regurgitation is not visualized. No aortic stenosis is present. Aortic valve mean gradient measures 4.0 mmHg. Aortic valve peak gradient measures 6.5 mmHg. Pulmonic Valve: The pulmonic valve was grossly normal. Pulmonic valve regurgitation is trivial. No evidence of pulmonic stenosis. Aorta: The aortic root is normal in size and structure. Venous: The inferior vena cava is dilated in size with less than 50% respiratory variability, suggesting right atrial pressure of 15 mmHg. IAS/Shunts: The atrial septum is grossly normal.  LEFT VENTRICLE PLAX 2D LVIDd:         4.70 cm Diastology LVIDs:         3.30 cm LV e' medial:    3.48 cm/s LV PW:         1.70 cm LV E/e' medial:  32.8 LV IVS:        1.50 cm LV e' lateral:   5.77 cm/s                        LV E/e' lateral: 19.8  RIGHT VENTRICLE RV S prime:     8.16 cm/s LEFT ATRIUM              Index        RIGHT ATRIUM           Index LA Vol (A2C):   106.0 ml 49.36 ml/m  RA Area:     30.30 cm LA Vol (A4C):   69.8 ml  32.51 ml/m  RA Volume:   99.90 ml  46.52 ml/m LA Biplane Vol: 87.8 ml  40.89 ml/m  AORTIC VALVE AV Vmax:           127.00 cm/s AV Vmean:          94.500 cm/s AV VTI:            0.358 m AV Peak Grad:      6.5 mmHg AV Mean Grad:      4.0 mmHg LVOT Vmax:         93.40 cm/s  LVOT Vmean:        63.700 cm/s LVOT VTI:          0.239 m LVOT/AV VTI ratio: 0.67 MITRAL VALVE MV Area (PHT): 3.23 cm     SHUNTS MV Decel Time: 235 msec     Systemic VTI: 0.24 m MV E velocity: 114.00 cm/s MV A velocity: 73.40 cm/s MV E/A ratio:  1.55 Lennie Odor MD Electronically signed by Lennie Odor MD Signature Date/Time: 09/28/2023/3:17:41 PM    Final    Disposition   Patient is being discharged home today in good condition.  Follow-up Plans & Appointments     Follow-up Information     [facility] Follow up in 7 day(s).   Specialty: Cardiology Why: Hospital follow up 10/11/2023 @ 9:45 am PLEASE bring a current medication list to appointment FREE valet parking, Entrance C, Off [employer] information: [address] [place] [address] [phone]        Alver Sorrow, NP Follow up.  Specialty: Cardiology Why: Hospital follow-up with General Cardiology scheduled for 10/25/2023 at 2:20pm. Please arrive 15 mintues early for check-in. If this date/ time does not work for you, please call our office to reschdule. Contact information: [address] [address] [phone]         Nephrology Follow up.   Why: Nephrology will arrange follow-up visit. If you do not heart from them within 2 business day, please call to arrange hospital follow-up.               Discharge Instructions     Diet - low sodium heart healthy   Complete by: As directed    Increase activity slowly   Complete by: As directed         Discharge Medications   Allergies as of 10/05/2023       Reactions   Latex Itching, Rash   Lisinopril Other (See Comments)   Headache        Medication List     STOP taking these medications    carvedilol 6.25 MG tablet Commonly known as: COREG   Farxiga 10 MG Tabs tablet Generic drug: dapagliflozin propanediol   losartan 100 MG tablet Commonly  known as: COZAAR       TAKE these medications    allopurinol 300 MG tablet Commonly known as: ZYLOPRIM TAKE 1 TABLET BY MOUTH DAILY   amLODipine 10 MG tablet Commonly known as: NORVASC Take 1 tablet (10 mg total) by mouth daily.   atorvastatin 20 MG tablet Commonly known as: LIPITOR Take 1 tablet (20 mg total) by mouth daily. *Need appointment for further refills, call [phone].*   blood glucose meter kit and supplies Dispense based on patient and insurance preference. Use up to four times daily as directed. (FOR ICD-10 E10.9, E11.9).   Dexcom G7 Sensor Misc 1 Device by Does not apply route as directed.   ferrous sulfate 325 (65 FE) MG tablet Take 1 tablet (325 mg total) by mouth daily with breakfast. Start taking on: October 06, 2023   fluticasone 50 MCG/ACT nasal spray Commonly known as: FLONASE Place 2 sprays into both nostrils daily. What changed:  when to take this reasons to take this   furosemide 40 MG tablet Commonly known as: LASIX Take 1 tablet (40 mg total) by mouth 2 (two) times daily. What changed:  medication strength how much to take additional instructions   hydrALAZINE 100 MG tablet Commonly known as: APRESOLINE TAKE 1 TABLET BY MOUTH 3 TIMES  DAILY   Insulin Pen Needle 32G X 4 MM Misc 1 Device by Does not apply route in the morning, at noon, in the evening, and at bedtime.   isosorbide mononitrate 30 MG 24 hr tablet Commonly known as: IMDUR Take 3 tablets (90 mg total) by mouth daily. Start taking on: October 06, 2023   Lantus SoloStar 100 UNIT/ML Solostar Pen Generic drug: insulin glargine Inject 50 Units into the skin daily. In the afternoon What changed: Another medication with the same name was removed. Continue taking this medication, and follow the directions you see here.   NovoLOG FlexPen 100 UNIT/ML FlexPen Generic drug: insulin aspart Continuing sliding scale as previously prescribed. What changed:  how much to take how to  take this when to take this additional instructions           Outstanding Labs/Studies   Repeat BMET and CBC at follow-up visit.   Duration of Discharge Encounter: APP Time: 25 minutes   Signed, Anala Whisenant  Jamey Ripa, PA-C 10/05/2023, 2:13 PM

## 2023-10-05 NOTE — Progress Notes (Signed)
   Heart Failure Stewardship Pharmacist Progress Note   PCP: Olive Bass, FNP PCP-Cardiologist: Chilton Si, MD    HPI:  63 yo F with PMH of CHF, asthma, CKD IV, T2DM, GERD, HTN, anemia, obesity, OSA, and osteoarthritis.   Presented to [facility] cardiology office on 3/3. She was experiencing worsening shortness of breath, orthopnea, PND, and LE edema since October. Symptoms began after there was a lapse in her insurance coverage. Dry weight reported at 250 lbs. Admitted to the hospital for IV diuresis. BNP 1677.7. ECHO 3/4 showed LVEF 35-40% (was 60-65% in 02/2021), global hypokinesis, moderate concentric LVH, G2DD, RV moderately reduced, trivial MR, RA pressure 15. GDMT limited by AKI on CKD IV. Nephrology consulted.   States that she was switched from [facility] commercial insurance to [facility] plan when she retired and this was the issue she was experiencing last fall to be able to get her medications covered. After her new insurance became active and she switched pharmacies to [facility], she has not had any issues obtaining her medications.   Denies shortness of breath. Able to lay flat. No LE edema. UNNA boots placed - states that they are becoming uncomfortable and asking when these can be removed.  Current HF Medications: Other: hydralazine 100 mg TID + Imdur 90 mg daily  Prior to admission HF Medications: Diuretic: furosemide 20 mg BID Beta blocker: carvedilol 6.25 mg BID ACE/ARB/ARNI: losartan 100 mg daily SGLT2i: Farxiga 10 mg daily Other: hydralazine 100 mg TID  Pertinent Lab Values: Serum creatinine 4.32>4.73>4.67, BUN 65, Potassium 4.0, Sodium 135, BNP 1677.7, Magnesium 2.1, A1c 7.5   Vital Signs: Weight: 259 lbs (admission weight: 281 lbs) Blood pressure: 130-150/60s  Heart rate: 50s  I/O: net -0.3L yesterday; net -11L since admission  Medication Assistance / Insurance Benefits Check: Does the patient have prescription insurance?  Yes Type of  insurance plan: [place] Medicaid  Outpatient Pharmacy:  Prior to admission outpatient pharmacy: [facility] Is the patient willing to use [facility] TOC pharmacy at discharge? Yes    Assessment: 1. Acute on chronic systolic CHF (LVEF 35-40%). NYHA class II symptoms. - Now off IV lasix, transitioned to torsemide 100 mg daily on 3/9 but with worsening AKI, stopped after dose given on 3/10. Creatinine slightly improved today but remains elevated. Diuretics per renal. Strict I/Os and daily weights. Keep K>4 and Mg>2. - Holding carvedilol with further episodes of bradycardia - Holding PTA losartan and Farxiga with AKI on CKD IV - Continue hydralazine 100 mg TID, Imdur 90 mg daily, and amlodipine 10 mg daily.  Plan: 1) Medication changes recommended at this time: - None, diuretics per renal  2) Patient assistance: - Has [place] Medicaid  3)  Education  - Patient has been educated on current HF medications and potential additions to HF medication regimen - Patient verbalizes understanding that over the next few months, these medication doses may change and more medications may be added to optimize HF regimen - Patient has been educated on basic disease state pathophysiology and goals of therapy   Sharen Hones, PharmD, BCPS Heart Failure Stewardship Pharmacist Phone [phone]

## 2023-10-08 ENCOUNTER — Telehealth: Payer: Self-pay

## 2023-10-08 NOTE — Progress Notes (Signed)
 Care Guide Pharmacy Note  10/08/2023 Name: Lori Mclaughlin MRN: [id number] DOB: Mar 30, 1961  Referred By: Olive Bass, FNP Reason for referral: Care Coordination (Outreach to schedule with Pharm d )   Lori Mclaughlin is a 63 y.o. year old female who is a primary care patient of Olive Bass, FNP.  Lori Mclaughlin was referred to the pharmacist for assistance related to: HTN  An unsuccessful telephone outreach was attempted today to contact the patient who was referred to the pharmacy team for assistance with medication management. Additional attempts will be made to contact the patient.  Penne Lash , RMA     [facility]  [facility], [place] Hospital Guide  Direct Dial: [phone]  Website: [email or web address]

## 2023-10-11 ENCOUNTER — Telehealth (HOSPITAL_COMMUNITY): Payer: Self-pay | Admitting: *Deleted

## 2023-10-11 ENCOUNTER — Encounter (HOSPITAL_COMMUNITY)

## 2023-10-11 ENCOUNTER — Ambulatory Visit (HOSPITAL_COMMUNITY)
Admission: RE | Admit: 2023-10-11 | Discharge: 2023-10-11 | Disposition: A | Discharge status: Home / Self Care | Source: Ambulatory Visit | Attending: Cardiology | Admitting: Cardiology

## 2023-10-11 ENCOUNTER — Encounter (HOSPITAL_COMMUNITY): Payer: Self-pay

## 2023-10-11 ENCOUNTER — Other Ambulatory Visit (HOSPITAL_COMMUNITY): Payer: Self-pay | Admitting: *Deleted

## 2023-10-11 VITALS — BP 120/80 | HR 67 | Wt 263.0 lb

## 2023-10-11 DIAGNOSIS — I130 Hypertensive heart and chronic kidney disease with heart failure and stage 1 through stage 4 chronic kidney disease, or unspecified chronic kidney disease: Secondary | ICD-10-CM | POA: Diagnosis not present

## 2023-10-11 DIAGNOSIS — I5023 Acute on chronic systolic (congestive) heart failure: Secondary | ICD-10-CM

## 2023-10-11 DIAGNOSIS — J45909 Unspecified asthma, uncomplicated: Secondary | ICD-10-CM | POA: Insufficient documentation

## 2023-10-11 DIAGNOSIS — E1122 Type 2 diabetes mellitus with diabetic chronic kidney disease: Secondary | ICD-10-CM | POA: Insufficient documentation

## 2023-10-11 DIAGNOSIS — I5032 Chronic diastolic (congestive) heart failure: Secondary | ICD-10-CM

## 2023-10-11 DIAGNOSIS — Z5982 Transportation insecurity: Secondary | ICD-10-CM | POA: Insufficient documentation

## 2023-10-11 DIAGNOSIS — G4733 Obstructive sleep apnea (adult) (pediatric): Secondary | ICD-10-CM | POA: Diagnosis not present

## 2023-10-11 DIAGNOSIS — Z8249 Family history of ischemic heart disease and other diseases of the circulatory system: Secondary | ICD-10-CM | POA: Diagnosis not present

## 2023-10-11 DIAGNOSIS — I4510 Unspecified right bundle-branch block: Secondary | ICD-10-CM | POA: Insufficient documentation

## 2023-10-11 DIAGNOSIS — N184 Chronic kidney disease, stage 4 (severe): Secondary | ICD-10-CM | POA: Diagnosis not present

## 2023-10-11 DIAGNOSIS — Z79899 Other long term (current) drug therapy: Secondary | ICD-10-CM | POA: Diagnosis not present

## 2023-10-11 DIAGNOSIS — Z794 Long term (current) use of insulin: Secondary | ICD-10-CM | POA: Insufficient documentation

## 2023-10-11 DIAGNOSIS — Z833 Family history of diabetes mellitus: Secondary | ICD-10-CM | POA: Insufficient documentation

## 2023-10-11 LAB — BASIC METABOLIC PANEL
Anion gap: 11 (ref 5–15)
BUN: 47 mg/dL — ABNORMAL HIGH (ref 8–23)
CO2: 22 mmol/L (ref 22–32)
Calcium: 8.7 mg/dL — ABNORMAL LOW (ref 8.9–10.3)
Chloride: 103 mmol/L (ref 98–111)
Creatinine, Ser: 3.81 mg/dL — ABNORMAL HIGH (ref 0.44–1.00)
GFR, Estimated: 13 mL/min — ABNORMAL LOW (ref >60)
Glucose, Bld: 215 mg/dL — ABNORMAL HIGH (ref 70–99)
Potassium: 4.1 mmol/L (ref 3.5–5.1)
Sodium: 136 mmol/L (ref 135–145)

## 2023-10-11 LAB — CBC
HCT: 30 % — ABNORMAL LOW (ref 36.0–46.0)
Hemoglobin: 9.2 g/dL — ABNORMAL LOW (ref 12.0–15.0)
MCH: 21.8 pg — ABNORMAL LOW (ref 26.0–34.0)
MCHC: 30.7 g/dL (ref 30.0–36.0)
MCV: 71.1 fL — ABNORMAL LOW (ref 80.0–100.0)
Platelets: 217 10*3/uL (ref 150–400)
RBC: 4.22 MIL/uL (ref 3.87–5.11)
RDW: 21.6 % — ABNORMAL HIGH (ref 11.5–15.5)
WBC: 7.6 10*3/uL (ref 4.0–10.5)
nRBC: 0 % (ref 0.0–0.2)

## 2023-10-11 LAB — BRAIN NATRIURETIC PEPTIDE: B Natriuretic Peptide: 1010 pg/mL — ABNORMAL HIGH (ref 0.0–100.0)

## 2023-10-11 MED ORDER — FUROSEMIDE 40 MG PO TABS: ORAL_TABLET | ORAL | 1 refills | Status: DC

## 2023-10-11 NOTE — Telephone Encounter (Signed)
 Called patient per Boyce Medici, PA with following lab results and instructions:  "Fluid marker (BNP) remains elevated. Increase Lasix to 60 qam/ 40 qpm. Continue on this dose until scheduled RHC"  Pt verbalized understanding of same. She will discuss dosing with Dr. Elwyn Lade after her RHC scheduled 10/25/23.

## 2023-10-11 NOTE — H&P (View-Only) (Signed)
 HEART & VASCULAR TRANSITION OF CARE CONSULT NOTE     Referring Physician: Dr. Servando Salina  PCP: Olive Bass, FNP  Primary Cardiologist: Dr. Duke Salvia   Nephrologist: Dr. Malen Gauze   Chief Complaint: Systolic Heart Failure   HPI: Referred to clinic by Dr. Servando Salina for heart failure consultation.   Lori Mclaughlin is a 63 y.o. female with a history of chronic HFpEF (prior Echo 8/22 EF 60-65%, RV nl), hypertension, hyperlipidemia, type 2 diabetes mellitus, CKD stage IV, asthma, GERD, anemia, and obstructive sleep apnea with nocturnal hypoxemia who was direct admitted from cardiology office on 09/27/2023 for acute on chronic CHF due to worsening shortness of breath and edema. Echo showed drop in EF down to 35-40% w/ global HK,GIIDD, moderately reduced RV and dilated IVC (assumed RAP 15). Drop in EF uncertain, did not undergo cath due to renal dysfunction.   She was started on IV Lasix but had worsening renal function so nephrology was consulted to assist. Creatinine was 3.44 on admission which is around baseline. However, creatinine continued to slowly increase with diuresis, creatinine peaked at 4.73. Nephrology was consulted and this was felt to be due to cardiorenal syndrome. Renal ultrasound showed possible non-obstructive right renal calculus but otherwise unremarkable. RHC was not done during admission. Nephrology assisted w/ diuretic dose adjustment.  Ultimately diuresed 11 lb. Discharge weight 259 lbs (down from 281 lbs on admission). Nephrology recommended discharging on Lasix 40mg  twice daily (previously on 20mg  twice daily). No ACEi/ARB/ARNI or MRA given renal function. Home Losartan stopped due to renal function and Coreg stopped due to bradycardia. She was started on Imdur 90mg  daily. Continued on Hydralazine 100mg  three times daily and Amlodipine 10mg  daily. SCr day of discharge was 4.67, K 4.0. GFR 10.   She presents today for post hospital f/u. Here w/ her daughter. She says  she feels significantly better. No resting dyspnea, able to sleep at night w/o orthopnea. No SOB w/ basic daily to day activities, NYHA class II. Reports wt has been stable at home since d/c. Compliant w/ meds and reports good UOP w/ PO Lasix.   Despite symptomatic improvement, she has 2+ b/l LEE on exam and elevated JVD.   BP is well controlled, 120/80. EKG shows NSR w/ RBBB inferolateral infarct pattern. 71 bpm. She denies CP.    Cardiac Testing   Echocardiogram 09/26/2023: Impressions: 1. Left ventricular ejection fraction, by estimation, is 35 to 40%. The  left ventricle has moderately decreased function. The left ventricle  demonstrates global hypokinesis. There is moderate concentric left  ventricular hypertrophy. Left ventricular  diastolic parameters are consistent with Grade II diastolic dysfunction  (pseudonormalization).   2. Right ventricular systolic function is moderately reduced. The right  ventricular size is moderately enlarged. Tricuspid regurgitation signal is  inadequate for assessing PA pressure.   3. Left atrial size was mildly dilated.   4. The mitral valve is grossly normal. Trivial mitral valve  regurgitation. No evidence of mitral stenosis.   5. The aortic valve is tricuspid. Aortic valve regurgitation is not  visualized. No aortic stenosis is present.   6. The inferior vena cava is dilated in size with <50% respiratory  variability, suggesting right atrial pressure of 15 mmHg.    Past Medical History:  Diagnosis Date   Allergic rhinitis    Asthma    Atypical chest pain    a. 03/2014: normal nuclear stress test.   Chronic diastolic CHF (congestive heart failure) (HCC)    a.  Dx 03/2014 with echo - moderate LVH, EF 55-60%, grade 1 diastolic dysfunction, mildly dilated LA.   Chronic diastolic heart failure (HCC) 03/07/2021   CKD (chronic kidney disease), stage II    Diabetes mellitus type II    GERD (gastroesophageal reflux disease)    Headache(784.0)     when my bp is up   Hypertension    Hypertensive heart disease    Microcytic anemia    OSA (obstructive sleep apnea)    severe with AHI 77/hr with nocturnal hypoxemia   Osteoarthritis    Right upper quadrant pain 06/04/2022    Current Outpatient Medications  Medication Sig Dispense Refill   allopurinol (ZYLOPRIM) 300 MG tablet TAKE 1 TABLET BY MOUTH DAILY 90 tablet 3   amLODipine (NORVASC) 10 MG tablet Take 1 tablet (10 mg total) by mouth daily. 90 tablet 3   atorvastatin (LIPITOR) 20 MG tablet Take 1 tablet (20 mg total) by mouth daily. *Need appointment for further refills, call [phone].* 30 tablet 0   blood glucose meter kit and supplies Dispense based on patient and insurance preference. Use up to four times daily as directed. (FOR ICD-10 E10.9, E11.9). 1 each 0   Continuous Glucose Sensor (DEXCOM G7 SENSOR) MISC 1 Device by Does not apply route as directed. 9 each 3   ferrous sulfate 325 (65 FE) MG tablet Take 1 tablet (325 mg total) by mouth daily with breakfast. 30 tablet 1   fluticasone (FLONASE) 50 MCG/ACT nasal spray Place 2 sprays into both nostrils as needed for allergies or rhinitis.     furosemide (LASIX) 40 MG tablet Take 1 tablet (40 mg total) by mouth 2 (two) times daily. 60 tablet 2   hydrALAZINE (APRESOLINE) 100 MG tablet TAKE 1 TABLET BY MOUTH 3 TIMES  DAILY 270 tablet 1   insulin aspart (NOVOLOG FLEXPEN) 100 UNIT/ML FlexPen Continuing sliding scale as previously prescribed.     insulin glargine (LANTUS SOLOSTAR) 100 UNIT/ML Solostar Pen Inject 50 Units into the skin daily. In the afternoon 15 mL 1   Insulin Pen Needle 32G X 4 MM MISC 1 Device by Does not apply route in the morning, at noon, in the evening, and at bedtime. 400 each 2   isosorbide mononitrate (IMDUR) 30 MG 24 hr tablet Take 3 tablets (90 mg total) by mouth daily. 30 tablet 2   No current facility-administered medications for this encounter.    Allergies  Allergen Reactions   Latex Itching and  Rash   Lisinopril Other (See Comments)    Headache      Social History   Socioeconomic History   Marital status: Divorced    Spouse name: Not on file   Number of children: 2   Years of education: Not on file   Highest education level: Some college, no degree  Occupational History   Occupation: retired  Tobacco Use   Smoking status: Former    Current packs/day: 0.00    Types: Cigarettes    Quit date: 08/29/2010    Years since quitting: 13.1   Smokeless tobacco: Never  Vaping Use   Vaping status: Never Used  Substance and Sexual Activity   Alcohol use: No    Alcohol/week: 5.0 standard drinks of alcohol    Types: 5 Glasses of wine per week   Drug use: No   Sexual activity: Yes    Birth control/protection: Surgical  Other Topics Concern   Not on file  Social History Narrative   Regular Exercise- yes  Social Drivers of Corporate investment banker Strain: Low Risk  (09/30/2023)   Overall Financial Resource Strain (CARDIA)    Difficulty of Paying Living Expenses: Not very hard  Food Insecurity: No Food Insecurity (09/27/2023)   Hunger Vital Sign    Worried About Running Out of Food in the Last Year: Never true    Ran Out of Food in the Last Year: Never true  Transportation Needs: No Transportation Needs (09/30/2023)   PRAPARE - Administrator, Civil Service (Medical): No    Lack of Transportation (Non-Medical): No  Recent Concern: Transportation Needs - Unmet Transportation Needs (09/27/2023)   PRAPARE - Administrator, Civil Service (Medical): Yes    Lack of Transportation (Non-Medical): No  Physical Activity: Unknown (09/06/2023)   Exercise Vital Sign    Days of Exercise per Week: 0 days    Minutes of Exercise per Session: Not on file  Stress: No Stress Concern Present (09/06/2023)   [employer] of Occupational Health - Occupational Stress Questionnaire    Feeling of Stress : Not at all  Social Connections: Moderately Integrated (09/27/2023)    Social Connection and Isolation Panel [NHANES]    Frequency of Communication with Friends and Family: More than three times a week    Frequency of Social Gatherings with Friends and Family: Twice a week    Attends Religious Services: More than 4 times per year    Active Member of [employer] or Organizations: Yes    Attends Banker Meetings: Not on file    Marital Status: Divorced  Recent Concern: Social Connections - Moderately Isolated (09/06/2023)   Social Connection and Isolation Panel [NHANES]    Frequency of Communication with Friends and Family: More than three times a week    Frequency of Social Gatherings with Friends and Family: Once a week    Attends Religious Services: More than 4 times per year    Active Member of [employer] or Organizations: No    Attends Engineer, structural: Not on file    Marital Status: Divorced  Intimate Partner Violence: Not At Risk (09/27/2023)   Humiliation, Afraid, Rape, and Kick questionnaire    Fear of Current or Ex-Partner: No    Emotionally Abused: No    Physically Abused: No    Sexually Abused: No      Family History  Problem Relation Age of Onset   Heart attack Mother    Hypertension Mother    Cancer Father    Hypertension Brother    Hypertension Maternal Grandmother    Hypertension Maternal Grandfather    Diabetes Other    Hypertension Other    Stroke Other    Cancer Other        colon; prostate   Kidney disease Neg Hx    Alcohol abuse Neg Hx    Asthma Neg Hx    COPD Neg Hx    Depression Neg Hx    Drug abuse Neg Hx    Early death Neg Hx    Hearing loss Neg Hx    Heart disease Neg Hx    Hyperlipidemia Neg Hx     Vitals:   10/11/23 0953  BP: 120/80  Pulse: 67  SpO2: 99%  Weight: 119.3 kg (263 lb)    PHYSICAL EXAM: General:  Well appearing, moderately obese. No respiratory difficulty HEENT: normal Neck: supple. JVD 10-12 cm Carotids 2+ bilat; no bruits. No lymphadenopathy or thryomegaly appreciated. Cor:  PMI nondisplaced.  Regular rate & rhythm. No rubs, gallops or murmurs. Lungs: clear Abdomen: soft, nontender, nondistended. No hepatosplenomegaly. No bruits or masses. Good bowel sounds. Extremities: no cyanosis, clubbing, rash, 2+ b/l LE pitting edema Neuro: alert & oriented x 3, cranial nerves grossly intact. moves all 4 extremities w/o difficulty. Affect pleasant.  ECG: NSR 71 bpm w/ RBBB and inferolateral infarct pattern   ASSESSMENT & PLAN:  1. HFrEF - new diagnosis, 3/25 - previous echo 8/22 EF 60-65%, RV normal  - echo 3/25 EF 35-40%, Global HK, GIIDD, RV moderately reduced.  - Cause for drop in EF uncertain. Has risk factors for CAD but no recent CP and also not candidate for LHC given CKD IV. Gen cards considering cardiac PET for nonivasive ischemic testing/ risk stratification  - NYHA Class II. Much improved after hospital diuresis but remains edematous on exam w/ 2+ b/l LEE, though also in the setting of amlodipine use for HTN. JVD also up on exam  - will check BNP level - I think RHC will be helpful to better assess volume status and cardiac output, suspect elevated SCr likely cardiorenal w/ continue renal venous congestion/ possible low output from Bi-V failure. Discussed indication, risk/benefit for cath and pt willing to proceed. Will arrange w/ HF MD - for now continue Lasix  40 mg bid  - encouraged to wear TED hoses  - GDMT limited by renal fx. No MRA, ARNi   - continue Hydral 100 tid  - continue Imdur 90 mg daily. No further dose titration (careful not to lower BP too low w/ CKD IV)   2. CKD  IV: longstanding CKD. Previous baseline appears ~ 3.5. Up to 4.6 most recently - suspect cardiorenal, plan RHC per above - check BMP today  - she is followed by [facility] (Dr. Malen Gauze) will need to consult w/ MD to see if able to restart on SGLT2i (previously on Farxiga)   3. Hypertension  - controlled on current regimen, continue. No change - hydralazine 100 tid - Imdur 90 mg  daily - amlodipine 10 mg daily   4. OSA - followed by Dr. Mayford Knife  - on CPAP   5. Type 2DM - on insulin  - per PCP    Referred to HFSW (PCP, Medications, Transportation, ETOH Abuse, Drug Abuse, Insurance, Financial ): No  Refer to Pharmacy:  No Refer to Home Health:No Refer to Advanced Heart Failure Clinic: Pending RHC   Refer to General Cardiology: Yes (already followed by Dr. Duke Salvia)   Follow up : Arranging RHC to better guide diuretics/assess volume status and cardiac output. Suspect she can f/u w/ gen cards going forward. Will determine at next [facility] visit post RHC. F/u TOC in 4 wks.   Robbie Lis, PA-C 10/11/2023

## 2023-10-11 NOTE — Patient Instructions (Addendum)
 No change in medications. Labs today - will call you if abnormal. We have scheduled you to have a right heart catheterization with Dr. Elwyn Lade - see below. Return to Heart Failure TOC Clinic in 4 weeks - see below. Please call us at [phone] if any questions or concerns prior to your next visit.     [name] [address] [place] [address] Dept: [phone] Loc: [phone]  Lori Mclaughlin  10/11/2023  You are scheduled for a Cardiac Catheterization on Monday, March 31 with Dr. Clearnce Hasten.  1. Please arrive at the [facility] (Main Entrance A) at [facility]: [address] [place], [place] [address] at 5:30 AM (This time is 2 hour(s) before your procedure to ensure your preparation).   Free valet parking service is available. You will check in at ADMITTING. The support person will be asked to wait in the waiting room.  It is OK to have someone drop you off and come back when you are ready to be discharged.    Special note: Every effort is made to have your procedure done on time. Please understand that emergencies sometimes delay scheduled procedures.  2. Diet: Do not eat solid foods after midnight.  The patient may have clear liquids until 5am upon the day of the procedure.  3. Labs: have been drawn at today's visit.   4. Medication instructions in preparation for your procedure:   Contrast Allergy: No   Stop taking:  Take only 25 units of insulin the night before your procedure. Do not take any insulin on the day of the procedure. 2.   No sliding scale insulin the morning of your procedure.  3.   Hold your lasix the morning of the procedure.    On the morning of your procedure, take any morning medicines NOT listed above.  You may use sips of water.  5. Plan to go home the same day, you will only stay overnight if medically necessary. 6. Bring a current list of  your medications and current insurance cards. 7. You MUST have a responsible person to drive you home. 8. Someone MUST be with you the first 24 hours after you arrive home or your discharge will be delayed. 9. Please wear clothes that are easy to get on and off and wear slip-on shoes.  Thank you for allowing [place] to care for you!   -- [facility]

## 2023-10-11 NOTE — Progress Notes (Addendum)
 HEART & VASCULAR TRANSITION OF CARE CONSULT NOTE     Referring Physician: Dr. Servando Salina  PCP: Olive Bass, FNP  Primary Cardiologist: Dr. Duke Salvia   Nephrologist: Dr. Malen Gauze   Chief Complaint: Systolic Heart Failure   HPI: Referred to clinic by Dr. Servando Salina for heart failure consultation.   Lori Mclaughlin is a 63 y.o. female with a history of chronic HFpEF (prior Echo 8/22 EF 60-65%, RV nl), hypertension, hyperlipidemia, type 2 diabetes mellitus, CKD stage IV, asthma, GERD, anemia, and obstructive sleep apnea with nocturnal hypoxemia who was direct admitted from cardiology office on 09/27/2023 for acute on chronic CHF due to worsening shortness of breath and edema. Echo showed drop in EF down to 35-40% w/ global HK,GIIDD, moderately reduced RV and dilated IVC (assumed RAP 15). Drop in EF uncertain, did not undergo cath due to renal dysfunction.   She was started on IV Lasix but had worsening renal function so nephrology was consulted to assist. Creatinine was 3.44 on admission which is around baseline. However, creatinine continued to slowly increase with diuresis, creatinine peaked at 4.73. Nephrology was consulted and this was felt to be due to cardiorenal syndrome. Renal ultrasound showed possible non-obstructive right renal calculus but otherwise unremarkable. RHC was not done during admission. Nephrology assisted w/ diuretic dose adjustment.  Ultimately diuresed 11 lb. Discharge weight 259 lbs (down from 281 lbs on admission). Nephrology recommended discharging on Lasix 40mg  twice daily (previously on 20mg  twice daily). No ACEi/ARB/ARNI or MRA given renal function. Home Losartan stopped due to renal function and Coreg stopped due to bradycardia. She was started on Imdur 90mg  daily. Continued on Hydralazine 100mg  three times daily and Amlodipine 10mg  daily. SCr day of discharge was 4.67, K 4.0. GFR 10.   She presents today for post hospital f/u. Here w/ her daughter. She says  she feels significantly better. No resting dyspnea, able to sleep at night w/o orthopnea. No SOB w/ basic daily to day activities, NYHA class II. Reports wt has been stable at home since d/c. Compliant w/ meds and reports good UOP w/ PO Lasix.   Despite symptomatic improvement, she has 2+ b/l LEE on exam and elevated JVD.   BP is well controlled, 120/80. EKG shows NSR w/ RBBB inferolateral infarct pattern. 71 bpm. She denies CP.    Cardiac Testing   Echocardiogram 09/26/2023: Impressions: 1. Left ventricular ejection fraction, by estimation, is 35 to 40%. The  left ventricle has moderately decreased function. The left ventricle  demonstrates global hypokinesis. There is moderate concentric left  ventricular hypertrophy. Left ventricular  diastolic parameters are consistent with Grade II diastolic dysfunction  (pseudonormalization).   2. Right ventricular systolic function is moderately reduced. The right  ventricular size is moderately enlarged. Tricuspid regurgitation signal is  inadequate for assessing PA pressure.   3. Left atrial size was mildly dilated.   4. The mitral valve is grossly normal. Trivial mitral valve  regurgitation. No evidence of mitral stenosis.   5. The aortic valve is tricuspid. Aortic valve regurgitation is not  visualized. No aortic stenosis is present.   6. The inferior vena cava is dilated in size with <50% respiratory  variability, suggesting right atrial pressure of 15 mmHg.    Past Medical History:  Diagnosis Date   Allergic rhinitis    Asthma    Atypical chest pain    a. 03/2014: normal nuclear stress test.   Chronic diastolic CHF (congestive heart failure) (HCC)    a.  Dx 03/2014 with echo - moderate LVH, EF 55-60%, grade 1 diastolic dysfunction, mildly dilated LA.   Chronic diastolic heart failure (HCC) 03/07/2021   CKD (chronic kidney disease), stage II    Diabetes mellitus type II    GERD (gastroesophageal reflux disease)    Headache(784.0)     when my bp is up   Hypertension    Hypertensive heart disease    Microcytic anemia    OSA (obstructive sleep apnea)    severe with AHI 77/hr with nocturnal hypoxemia   Osteoarthritis    Right upper quadrant pain 06/04/2022    Current Outpatient Medications  Medication Sig Dispense Refill   allopurinol (ZYLOPRIM) 300 MG tablet TAKE 1 TABLET BY MOUTH DAILY 90 tablet 3   amLODipine (NORVASC) 10 MG tablet Take 1 tablet (10 mg total) by mouth daily. 90 tablet 3   atorvastatin (LIPITOR) 20 MG tablet Take 1 tablet (20 mg total) by mouth daily. *Need appointment for further refills, call [phone].* 30 tablet 0   blood glucose meter kit and supplies Dispense based on patient and insurance preference. Use up to four times daily as directed. (FOR ICD-10 E10.9, E11.9). 1 each 0   Continuous Glucose Sensor (DEXCOM G7 SENSOR) MISC 1 Device by Does not apply route as directed. 9 each 3   ferrous sulfate 325 (65 FE) MG tablet Take 1 tablet (325 mg total) by mouth daily with breakfast. 30 tablet 1   fluticasone (FLONASE) 50 MCG/ACT nasal spray Place 2 sprays into both nostrils as needed for allergies or rhinitis.     furosemide (LASIX) 40 MG tablet Take 1 tablet (40 mg total) by mouth 2 (two) times daily. 60 tablet 2   hydrALAZINE (APRESOLINE) 100 MG tablet TAKE 1 TABLET BY MOUTH 3 TIMES  DAILY 270 tablet 1   insulin aspart (NOVOLOG FLEXPEN) 100 UNIT/ML FlexPen Continuing sliding scale as previously prescribed.     insulin glargine (LANTUS SOLOSTAR) 100 UNIT/ML Solostar Pen Inject 50 Units into the skin daily. In the afternoon 15 mL 1   Insulin Pen Needle 32G X 4 MM MISC 1 Device by Does not apply route in the morning, at noon, in the evening, and at bedtime. 400 each 2   isosorbide mononitrate (IMDUR) 30 MG 24 hr tablet Take 3 tablets (90 mg total) by mouth daily. 30 tablet 2   No current facility-administered medications for this encounter.    Allergies  Allergen Reactions   Latex Itching and  Rash   Lisinopril Other (See Comments)    Headache      Social History   Socioeconomic History   Marital status: Divorced    Spouse name: Not on file   Number of children: 2   Years of education: Not on file   Highest education level: Some college, no degree  Occupational History   Occupation: retired  Tobacco Use   Smoking status: Former    Current packs/day: 0.00    Types: Cigarettes    Quit date: 08/29/2010    Years since quitting: 13.1   Smokeless tobacco: Never  Vaping Use   Vaping status: Never Used  Substance and Sexual Activity   Alcohol use: No    Alcohol/week: 5.0 standard drinks of alcohol    Types: 5 Glasses of wine per week   Drug use: No   Sexual activity: Yes    Birth control/protection: Surgical  Other Topics Concern   Not on file  Social History Narrative   Regular Exercise- yes  Social Drivers of Corporate investment banker Strain: Low Risk  (09/30/2023)   Overall Financial Resource Strain (CARDIA)    Difficulty of Paying Living Expenses: Not very hard  Food Insecurity: No Food Insecurity (09/27/2023)   Hunger Vital Sign    Worried About Running Out of Food in the Last Year: Never true    Ran Out of Food in the Last Year: Never true  Transportation Needs: No Transportation Needs (09/30/2023)   PRAPARE - Administrator, Civil Service (Medical): No    Lack of Transportation (Non-Medical): No  Recent Concern: Transportation Needs - Unmet Transportation Needs (09/27/2023)   PRAPARE - Administrator, Civil Service (Medical): Yes    Lack of Transportation (Non-Medical): No  Physical Activity: Unknown (09/06/2023)   Exercise Vital Sign    Days of Exercise per Week: 0 days    Minutes of Exercise per Session: Not on file  Stress: No Stress Concern Present (09/06/2023)   [employer] of Occupational Health - Occupational Stress Questionnaire    Feeling of Stress : Not at all  Social Connections: Moderately Integrated (09/27/2023)    Social Connection and Isolation Panel [NHANES]    Frequency of Communication with Friends and Family: More than three times a week    Frequency of Social Gatherings with Friends and Family: Twice a week    Attends Religious Services: More than 4 times per year    Active Member of [employer] or Organizations: Yes    Attends Banker Meetings: Not on file    Marital Status: Divorced  Recent Concern: Social Connections - Moderately Isolated (09/06/2023)   Social Connection and Isolation Panel [NHANES]    Frequency of Communication with Friends and Family: More than three times a week    Frequency of Social Gatherings with Friends and Family: Once a week    Attends Religious Services: More than 4 times per year    Active Member of [employer] or Organizations: No    Attends Engineer, structural: Not on file    Marital Status: Divorced  Intimate Partner Violence: Not At Risk (09/27/2023)   Humiliation, Afraid, Rape, and Kick questionnaire    Fear of Current or Ex-Partner: No    Emotionally Abused: No    Physically Abused: No    Sexually Abused: No      Family History  Problem Relation Age of Onset   Heart attack Mother    Hypertension Mother    Cancer Father    Hypertension Brother    Hypertension Maternal Grandmother    Hypertension Maternal Grandfather    Diabetes Other    Hypertension Other    Stroke Other    Cancer Other        colon; prostate   Kidney disease Neg Hx    Alcohol abuse Neg Hx    Asthma Neg Hx    COPD Neg Hx    Depression Neg Hx    Drug abuse Neg Hx    Early death Neg Hx    Hearing loss Neg Hx    Heart disease Neg Hx    Hyperlipidemia Neg Hx     Vitals:   10/11/23 0953  BP: 120/80  Pulse: 67  SpO2: 99%  Weight: 119.3 kg (263 lb)    PHYSICAL EXAM: General:  Well appearing, moderately obese. No respiratory difficulty HEENT: normal Neck: supple. JVD 10-12 cm Carotids 2+ bilat; no bruits. No lymphadenopathy or thryomegaly appreciated. Cor:  PMI nondisplaced.  Regular rate & rhythm. No rubs, gallops or murmurs. Lungs: clear Abdomen: soft, nontender, nondistended. No hepatosplenomegaly. No bruits or masses. Good bowel sounds. Extremities: no cyanosis, clubbing, rash, 2+ b/l LE pitting edema Neuro: alert & oriented x 3, cranial nerves grossly intact. moves all 4 extremities w/o difficulty. Affect pleasant.  ECG: NSR 71 bpm w/ RBBB and inferolateral infarct pattern   ASSESSMENT & PLAN:  1. HFrEF - new diagnosis, 3/25 - previous echo 8/22 EF 60-65%, RV normal  - echo 3/25 EF 35-40%, Global HK, GIIDD, RV moderately reduced.  - Cause for drop in EF uncertain. Has risk factors for CAD but no recent CP and also not candidate for LHC given CKD IV. Gen cards considering cardiac PET for nonivasive ischemic testing/ risk stratification  - NYHA Class II. Much improved after hospital diuresis but remains edematous on exam w/ 2+ b/l LEE, though also in the setting of amlodipine use for HTN. JVD also up on exam  - will check BNP level - I think RHC will be helpful to better assess volume status and cardiac output, suspect elevated SCr likely cardiorenal w/ continue renal venous congestion/ possible low output from Bi-V failure. Discussed indication, risk/benefit for cath and pt willing to proceed. Will arrange w/ HF MD - for now continue Lasix  40 mg bid  - encouraged to wear TED hoses  - GDMT limited by renal fx. No MRA, ARNi   - continue Hydral 100 tid  - continue Imdur 90 mg daily. No further dose titration (careful not to lower BP too low w/ CKD IV)   2. CKD  IV: longstanding CKD. Previous baseline appears ~ 3.5. Up to 4.6 most recently - suspect cardiorenal, plan RHC per above - check BMP today  - she is followed by [facility] (Dr. Malen Gauze) will need to consult w/ MD to see if able to restart on SGLT2i (previously on Farxiga)   3. Hypertension  - controlled on current regimen, continue. No change - hydralazine 100 tid - Imdur 90 mg  daily - amlodipine 10 mg daily   4. OSA - followed by Dr. Mayford Knife  - on CPAP   5. Type 2DM - on insulin  - per PCP    Referred to HFSW (PCP, Medications, Transportation, ETOH Abuse, Drug Abuse, Insurance, Financial ): No  Refer to Pharmacy:  No Refer to Home Health:No Refer to Advanced [facility] Failure Clinic: Pending RHC   Refer to General Cardiology: Yes (already followed by Dr. Duke Salvia)   Follow up : Arranging RHC to better guide diuretics/assess volume status and cardiac output. Suspect she can f/u w/ gen cards going forward. Will determine at next [facility] visit post RHC. F/u TOC in 4 wks.   Robbie Lis, PA-C 10/11/2023

## 2023-10-15 NOTE — Progress Notes (Signed)
 Care Guide Pharmacy Note  10/15/2023 Name: Lori Mclaughlin MRN: [id number] DOB: January 14, 1961  Referred By: Olive Bass, FNP Reason for referral: Care Coordination (Outreach to schedule with Pharm d )   Lori Mclaughlin is a 63 y.o. year old female who is a primary care patient of Olive Bass, FNP.  Lori Mclaughlin was referred to the pharmacist for assistance related to: HTN  A second unsuccessful telephone outreach was attempted today to contact the patient who was referred to the pharmacy team for assistance with medication management. Additional attempts will be made to contact the patient.  Penne Lash , RMA     [facility]  [facility] Guide  Direct Dial: [phone]  Website: [email or web address]

## 2023-10-21 NOTE — Progress Notes (Signed)
 Care Guide Pharmacy Note  10/21/2023 Name: Lori Mclaughlin MRN: [id number] DOB: July 25, 1961  Referred By: Olive Bass, FNP Reason for referral: Care Coordination (Outreach to schedule with Pharm d )   Lori Mclaughlin is a 63 y.o. year old female who is a primary care patient of Olive Bass, FNP.  Lori Mclaughlin was referred to the pharmacist for assistance related to: HTN  A third unsuccessful telephone outreach was attempted today to contact the patient who was referred to the pharmacy team for assistance with medication management. The Population Health team is pleased to engage with this patient at any time in the future upon receipt of referral and should he/she be interested in assistance from the [facility] Health team.  Lori Mclaughlin , RMA     [facility]  [facility] Guide  Direct Dial: [phone]  Website: [email or web address]

## 2023-10-25 ENCOUNTER — Ambulatory Visit (HOSPITAL_COMMUNITY)
Admission: RE | Admit: 2023-10-25 | Discharge: 2023-10-25 | Disposition: A | Discharge status: Home / Self Care | Attending: Cardiology | Admitting: Cardiology

## 2023-10-25 ENCOUNTER — Other Ambulatory Visit (HOSPITAL_COMMUNITY): Payer: Self-pay

## 2023-10-25 ENCOUNTER — Ambulatory Visit (HOSPITAL_BASED_OUTPATIENT_CLINIC_OR_DEPARTMENT_OTHER): Admitting: Family

## 2023-10-25 ENCOUNTER — Encounter (HOSPITAL_COMMUNITY)
Admission: RE | Disposition: A | Discharge status: Home / Self Care | Payer: Self-pay | Source: Home / Self Care | Attending: Cardiology

## 2023-10-25 ENCOUNTER — Other Ambulatory Visit (HOSPITAL_BASED_OUTPATIENT_CLINIC_OR_DEPARTMENT_OTHER): Payer: Self-pay

## 2023-10-25 ENCOUNTER — Telehealth: Payer: Self-pay | Admitting: Cardiovascular Disease

## 2023-10-25 ENCOUNTER — Other Ambulatory Visit: Payer: Self-pay

## 2023-10-25 DIAGNOSIS — E1122 Type 2 diabetes mellitus with diabetic chronic kidney disease: Secondary | ICD-10-CM | POA: Insufficient documentation

## 2023-10-25 DIAGNOSIS — G4733 Obstructive sleep apnea (adult) (pediatric): Secondary | ICD-10-CM | POA: Diagnosis not present

## 2023-10-25 DIAGNOSIS — N184 Chronic kidney disease, stage 4 (severe): Secondary | ICD-10-CM | POA: Diagnosis not present

## 2023-10-25 DIAGNOSIS — I5023 Acute on chronic systolic (congestive) heart failure: Secondary | ICD-10-CM

## 2023-10-25 DIAGNOSIS — Z8249 Family history of ischemic heart disease and other diseases of the circulatory system: Secondary | ICD-10-CM | POA: Diagnosis not present

## 2023-10-25 DIAGNOSIS — Z794 Long term (current) use of insulin: Secondary | ICD-10-CM | POA: Diagnosis not present

## 2023-10-25 DIAGNOSIS — I130 Hypertensive heart and chronic kidney disease with heart failure and stage 1 through stage 4 chronic kidney disease, or unspecified chronic kidney disease: Secondary | ICD-10-CM | POA: Diagnosis not present

## 2023-10-25 DIAGNOSIS — I509 Heart failure, unspecified: Secondary | ICD-10-CM

## 2023-10-25 DIAGNOSIS — Z79899 Other long term (current) drug therapy: Secondary | ICD-10-CM | POA: Diagnosis not present

## 2023-10-25 DIAGNOSIS — I5032 Chronic diastolic (congestive) heart failure: Secondary | ICD-10-CM

## 2023-10-25 DIAGNOSIS — Z87891 Personal history of nicotine dependence: Secondary | ICD-10-CM | POA: Diagnosis not present

## 2023-10-25 DIAGNOSIS — I2722 Pulmonary hypertension due to left heart disease: Secondary | ICD-10-CM | POA: Diagnosis not present

## 2023-10-25 DIAGNOSIS — I5040 Unspecified combined systolic (congestive) and diastolic (congestive) heart failure: Secondary | ICD-10-CM | POA: Insufficient documentation

## 2023-10-25 HISTORY — PX: RIGHT HEART CATH: CATH118263

## 2023-10-25 LAB — POCT I-STAT EG7
Acid-Base Excess: 0 mmol/L (ref 0.0–2.0)
Acid-Base Excess: 0 mmol/L (ref 0.0–2.0)
Acid-base deficit: 1 mmol/L (ref 0.0–2.0)
Bicarbonate: 23.8 mmol/L (ref 20.0–28.0)
Bicarbonate: 23.9 mmol/L (ref 20.0–28.0)
Bicarbonate: 24 mmol/L (ref 20.0–28.0)
Calcium, Ion: 1.13 mmol/L — ABNORMAL LOW (ref 1.15–1.40)
Calcium, Ion: 1.14 mmol/L — ABNORMAL LOW (ref 1.15–1.40)
Calcium, Ion: 1.15 mmol/L (ref 1.15–1.40)
HCT: 30 % — ABNORMAL LOW (ref 36.0–46.0)
HCT: 31 % — ABNORMAL LOW (ref 36.0–46.0)
HCT: 32 % — ABNORMAL LOW (ref 36.0–46.0)
Hemoglobin: 10.2 g/dL — ABNORMAL LOW (ref 12.0–15.0)
Hemoglobin: 10.5 g/dL — ABNORMAL LOW (ref 12.0–15.0)
Hemoglobin: 10.9 g/dL — ABNORMAL LOW (ref 12.0–15.0)
O2 Saturation: 71 %
O2 Saturation: 72 %
O2 Saturation: 72 %
Potassium: 3.8 mmol/L (ref 3.5–5.1)
Potassium: 3.8 mmol/L (ref 3.5–5.1)
Potassium: 3.8 mmol/L (ref 3.5–5.1)
Sodium: 140 mmol/L (ref 135–145)
Sodium: 140 mmol/L (ref 135–145)
Sodium: 140 mmol/L (ref 135–145)
TCO2: 25 mmol/L (ref 22–32)
TCO2: 25 mmol/L (ref 22–32)
TCO2: 25 mmol/L (ref 22–32)
pCO2, Ven: 35.7 mmHg — ABNORMAL LOW (ref 44–60)
pCO2, Ven: 36 mmHg — ABNORMAL LOW (ref 44–60)
pCO2, Ven: 37 mmHg — ABNORMAL LOW (ref 44–60)
pH, Ven: 7.417 (ref 7.25–7.43)
pH, Ven: 7.432 — ABNORMAL HIGH (ref 7.25–7.43)
pH, Ven: 7.433 — ABNORMAL HIGH (ref 7.25–7.43)
pO2, Ven: 36 mmHg (ref 32–45)
pO2, Ven: 36 mmHg (ref 32–45)
pO2, Ven: 37 mmHg (ref 32–45)

## 2023-10-25 LAB — GLUCOSE, CAPILLARY: Glucose-Capillary: 145 mg/dL — ABNORMAL HIGH (ref 70–99)

## 2023-10-25 SURGERY — RIGHT HEART CATH
Anesthesia: LOCAL

## 2023-10-25 MED ORDER — ISOSORBIDE MONONITRATE ER 120 MG PO TB24
120.0000 mg | ORAL_TABLET | Freq: Every day | ORAL | 3 refills | Status: DC
  Filled 2023-10-25: qty 90, 90d supply, fill #0
  Filled 2023-11-06 – 2024-02-13 (×2): qty 90, 90d supply, fill #1
  Filled 2024-05-26 (×2): qty 90, 90d supply, fill #2

## 2023-10-25 MED ORDER — FUROSEMIDE 80 MG PO TABS
80.0000 mg | ORAL_TABLET | Freq: Two times a day (BID) | ORAL | 11 refills | Status: DC
  Filled 2023-10-25: qty 60, 30d supply, fill #0

## 2023-10-25 MED ORDER — ASPIRIN 81 MG PO CHEW
CHEWABLE_TABLET | ORAL | Status: AC
  Filled 2023-10-25: qty 1

## 2023-10-25 MED ORDER — AMLODIPINE BESYLATE 10 MG PO TABS
10.0000 mg | ORAL_TABLET | Freq: Every day | ORAL | 3 refills | Status: DC
  Filled 2023-10-25: qty 90, 90d supply, fill #0

## 2023-10-25 MED ORDER — ATORVASTATIN CALCIUM 20 MG PO TABS
20.0000 mg | ORAL_TABLET | Freq: Every day | ORAL | 3 refills | Status: DC
  Filled 2023-10-25: qty 90, 90d supply, fill #0
  Filled 2024-01-20: qty 90, 90d supply, fill #1
  Filled 2024-04-11: qty 90, 90d supply, fill #2
  Filled 2024-07-17: qty 90, 90d supply, fill #3

## 2023-10-25 MED ORDER — SODIUM CHLORIDE 0.9 % IV SOLN: INTRAVENOUS | Status: DC

## 2023-10-25 MED ORDER — HYDRALAZINE HCL 100 MG PO TABS
100.0000 mg | ORAL_TABLET | Freq: Three times a day (TID) | ORAL | 1 refills | Status: DC
  Filled 2023-10-25: qty 270, 90d supply, fill #0

## 2023-10-25 MED ORDER — ATORVASTATIN CALCIUM 20 MG PO TABS
20.0000 mg | ORAL_TABLET | Freq: Every day | ORAL | 3 refills | Status: DC
  Filled 2023-10-25: qty 90, 90d supply, fill #0

## 2023-10-25 MED ORDER — ASPIRIN 81 MG PO CHEW: 81.0000 mg | CHEWABLE_TABLET | ORAL | Status: DC

## 2023-10-25 MED ORDER — LIDOCAINE HCL (PF) 1 % IJ SOLN
INTRAMUSCULAR | Status: AC
Start: 2023-10-25 — End: ?
  Filled 2023-10-25: qty 30

## 2023-10-25 MED ORDER — AMLODIPINE BESYLATE 10 MG PO TABS
10.0000 mg | ORAL_TABLET | Freq: Every day | ORAL | 3 refills | Status: DC
  Filled 2023-10-25: qty 90, 90d supply, fill #0
  Filled 2024-01-20: qty 90, 90d supply, fill #1
  Filled 2024-04-11: qty 90, 90d supply, fill #2
  Filled 2024-07-17: qty 90, 90d supply, fill #3

## 2023-10-25 MED ORDER — LIDOCAINE HCL (PF) 1 % IJ SOLN
INTRAMUSCULAR | Status: DC | PRN
  Administered 2023-10-25: 2 mL

## 2023-10-25 MED ORDER — HEPARIN (PORCINE) IN NACL 1000-0.9 UT/500ML-% IV SOLN
INTRAVENOUS | Status: DC | PRN
  Administered 2023-10-25: 500 mL

## 2023-10-25 MED ORDER — MIDAZOLAM HCL 2 MG/2ML IJ SOLN
INTRAMUSCULAR | Status: AC
  Filled 2023-10-25: qty 2

## 2023-10-25 MED ORDER — HYDRALAZINE HCL 100 MG PO TABS
100.0000 mg | ORAL_TABLET | Freq: Three times a day (TID) | ORAL | 1 refills | Status: DC
  Filled 2023-10-25: qty 270, 90d supply, fill #0
  Filled 2024-01-20: qty 270, 90d supply, fill #1

## 2023-10-25 MED ORDER — MIDAZOLAM HCL 2 MG/2ML IJ SOLN
INTRAMUSCULAR | Status: DC | PRN
  Administered 2023-10-25: 1 mg via INTRAVENOUS

## 2023-10-25 SURGICAL SUPPLY — 5 items
CATH BALLN WEDGE 5F 110CM (CATHETERS) IMPLANT
PACK CARDIAC CATHETERIZATION (CUSTOM PROCEDURE TRAY) ×1 IMPLANT
SHEATH GLIDE SLENDER 4/5FR (SHEATH) IMPLANT
TRANSDUCER W/STOPCOCK (MISCELLANEOUS) IMPLANT
TUBING ART PRESS 72 MALE/FEM (TUBING) IMPLANT

## 2023-10-25 NOTE — Interval H&P Note (Signed)
 History and Physical Interval Note:  10/25/2023 7:48 AM  Lori Mclaughlin  has presented today for surgery, with the diagnosis of heart failure.  The various methods of treatment have been discussed with the patient and family. After consideration of risks, benefits and other options for treatment, the patient has consented to  Procedure(s): RIGHT HEART CATH (N/A) as a surgical intervention.  The patient's history has been reviewed, patient examined, no change in status, stable for surgery.  I have reviewed the patient's chart and labs.  Questions were answered to the patient's satisfaction.     Romie Minus

## 2023-10-25 NOTE — Addendum Note (Signed)
 Addended by: Elwin Mocha on: 10/25/2023 10:40 AM   Modules accepted: Orders

## 2023-10-25 NOTE — Telephone Encounter (Signed)
*  STAT* If patient is at the pharmacy, call can be transferred to refill team.   1. Which medications need to be refilled? (please list name of each medication and dose if known)  atorvastatin (LIPITOR) 20 MG tablet  amLODipine (NORVASC) 10 MG tablet  hydrALAZINE (APRESOLINE) 100 MG tablet   allopurinol (ZYLOPRIM) 300 MG tablet   2. Which pharmacy/location (including street and city if local pharmacy) is medication to be sent to? [facility] Phone: [phone]  Fax: [phone]     3. Do they need a 30 day or 90 day supply? 90

## 2023-10-25 NOTE — Telephone Encounter (Signed)
 Refills sent to requested pharmacy for the following Heart Care meds...   atorvastatin (LIPITOR) 20 MG tablet  amLODipine (NORVASC) 10 MG tablet  hydrALAZINE (APRESOLINE) 100 MG tablet

## 2023-10-25 NOTE — Discharge Instructions (Signed)

## 2023-10-26 ENCOUNTER — Encounter (HOSPITAL_COMMUNITY): Payer: Self-pay | Admitting: Cardiology

## 2023-11-05 ENCOUNTER — Telehealth (HOSPITAL_COMMUNITY): Payer: Self-pay

## 2023-11-05 NOTE — Telephone Encounter (Signed)
 Called to confirm/remind patient of their appointment at the [facility] on 11/08/2023 1:30.   Appointment:   [x] Confirmed  [] Left mess   [] No answer/No voice mail  [] Phone not in service  Patient reminded to bring all medications and/or complete list.  Confirmed patient has transportation. Gave directions, instructed to utilize valet parking.

## 2023-11-06 ENCOUNTER — Other Ambulatory Visit (HOSPITAL_BASED_OUTPATIENT_CLINIC_OR_DEPARTMENT_OTHER): Payer: Self-pay

## 2023-11-08 ENCOUNTER — Other Ambulatory Visit: Payer: Self-pay

## 2023-11-08 ENCOUNTER — Other Ambulatory Visit (HOSPITAL_BASED_OUTPATIENT_CLINIC_OR_DEPARTMENT_OTHER): Payer: Self-pay

## 2023-11-08 ENCOUNTER — Ambulatory Visit (HOSPITAL_COMMUNITY)
Admission: RE | Admit: 2023-11-08 | Discharge: 2023-11-08 | Disposition: A | Discharge status: Home / Self Care | Source: Ambulatory Visit | Attending: Adult Health | Admitting: Adult Health

## 2023-11-08 VITALS — BP 154/86 | HR 96 | Wt 258.4 lb

## 2023-11-08 DIAGNOSIS — I10 Essential (primary) hypertension: Secondary | ICD-10-CM

## 2023-11-08 DIAGNOSIS — J45909 Unspecified asthma, uncomplicated: Secondary | ICD-10-CM | POA: Insufficient documentation

## 2023-11-08 DIAGNOSIS — I5023 Acute on chronic systolic (congestive) heart failure: Secondary | ICD-10-CM

## 2023-11-08 DIAGNOSIS — I5032 Chronic diastolic (congestive) heart failure: Secondary | ICD-10-CM

## 2023-11-08 DIAGNOSIS — Z87891 Personal history of nicotine dependence: Secondary | ICD-10-CM | POA: Insufficient documentation

## 2023-11-08 DIAGNOSIS — I5040 Unspecified combined systolic (congestive) and diastolic (congestive) heart failure: Secondary | ICD-10-CM | POA: Diagnosis not present

## 2023-11-08 DIAGNOSIS — G4733 Obstructive sleep apnea (adult) (pediatric): Secondary | ICD-10-CM | POA: Diagnosis not present

## 2023-11-08 DIAGNOSIS — Z5982 Transportation insecurity: Secondary | ICD-10-CM | POA: Diagnosis not present

## 2023-11-08 DIAGNOSIS — N184 Chronic kidney disease, stage 4 (severe): Secondary | ICD-10-CM | POA: Diagnosis not present

## 2023-11-08 DIAGNOSIS — I130 Hypertensive heart and chronic kidney disease with heart failure and stage 1 through stage 4 chronic kidney disease, or unspecified chronic kidney disease: Secondary | ICD-10-CM | POA: Insufficient documentation

## 2023-11-08 DIAGNOSIS — R944 Abnormal results of kidney function studies: Secondary | ICD-10-CM | POA: Diagnosis not present

## 2023-11-08 DIAGNOSIS — Z79899 Other long term (current) drug therapy: Secondary | ICD-10-CM | POA: Insufficient documentation

## 2023-11-08 DIAGNOSIS — E1122 Type 2 diabetes mellitus with diabetic chronic kidney disease: Secondary | ICD-10-CM | POA: Insufficient documentation

## 2023-11-08 LAB — BASIC METABOLIC PANEL WITH GFR
Anion gap: 12 (ref 5–15)
BUN: 75 mg/dL — ABNORMAL HIGH (ref 8–23)
CO2: 23 mmol/L (ref 22–32)
Calcium: 9.2 mg/dL (ref 8.9–10.3)
Chloride: 102 mmol/L (ref 98–111)
Creatinine, Ser: 4.08 mg/dL — ABNORMAL HIGH (ref 0.44–1.00)
GFR, Estimated: 12 mL/min — ABNORMAL LOW (ref >60)
Glucose, Bld: 157 mg/dL — ABNORMAL HIGH (ref 70–99)
Potassium: 3.5 mmol/L (ref 3.5–5.1)
Sodium: 137 mmol/L (ref 135–145)

## 2023-11-08 LAB — BRAIN NATRIURETIC PEPTIDE: B Natriuretic Peptide: 960.6 pg/mL — ABNORMAL HIGH (ref 0.0–100.0)

## 2023-11-08 MED ORDER — FUROSEMIDE 80 MG PO TABS
80.0000 mg | ORAL_TABLET | Freq: Two times a day (BID) | ORAL | 11 refills | Status: DC
  Filled 2023-11-08: qty 75, 25d supply, fill #0
  Filled 2023-11-09 – 2023-11-23 (×2): qty 75, 38d supply, fill #0
  Filled 2024-01-03: qty 75, 38d supply, fill #1
  Filled 2024-02-13: qty 75, 38d supply, fill #2
  Filled 2024-03-22: qty 75, 38d supply, fill #3
  Filled 2024-04-11: qty 75, 30d supply, fill #4
  Filled 2024-04-20: qty 75, 38d supply, fill #4
  Filled 2024-05-26: qty 75, 30d supply, fill #4
  Filled 2024-05-26: qty 75, 38d supply, fill #4
  Filled 2024-06-30 – 2024-07-17 (×2): qty 75, 30d supply, fill #5

## 2023-11-08 NOTE — Addendum Note (Signed)
 Encounter addended by: Remigio Carl, NP on: 11/08/2023 2:23 PM  Actions taken: Clinical Note Signed

## 2023-11-08 NOTE — Patient Instructions (Addendum)
 EKG done today.  RedsClip done today.   Labs done today. We will contact you only if your labs are abnormal.  INCREASE Lasix to 120mg  (1 &1/2 tablets) by mouth every morning and 80mg  (1 tablet) by mouth every evening for 2 days THEN DECREASE back to 80mg  (1 tablet) by mouth 2 times daily.  YOU MAY TAKE AN EXTRA OF 40MG  (1/2 TABLET) OF LASIX BY MOUTH DAILY AS NEEDED FOR SWELLING OR A WEIGHT GAIN OF 3 POUNDS OR MORE IN 24 HOURS OR 5 POUNDS IN 1 WEEK.   No other medication changes were made. Please continue all current medications as prescribed.  Thank you for allowing us  to provide your heart failure care after your recent hospitalization. Please follow-up with General Cardiology.

## 2023-11-08 NOTE — Progress Notes (Signed)
 ReDS Vest / Clip - 11/08/23 1400       ReDS Vest / Clip   Station Marker B    Ruler Value 32.5    ReDS Value Range High volume overload    ReDS Actual Value 53

## 2023-11-08 NOTE — Progress Notes (Addendum)
 HEART IMPACT TRANSITIONS OF CARE    PCP: Ria Clock FNP  Primary Cardiologist: Dr Duke Salvia  Chief Complaint: Heart Failure  HPI: Lori Mclaughlin is a 63 y.o. female with a history of chronic HFpEF (prior Echo 8/22 EF 60-65%, RV nl), hypertension, hyperlipidemia, type 2 diabetes mellitus, CKD stage IV, asthma, GERD, anemia, and obstructive sleep apnea with nocturnal hypoxemia who was direct admitted from cardiology office on 09/27/2023 for acute on chronic CHF due to worsening shortness of breath and edema. Echo showed drop in EF down to 35-40% w/ global HK,GIIDD, moderately reduced RV and dilated IVC (assumed RAP 15). Drop in EF uncertain, did not undergo cath due to renal dysfunction.    She was started on IV Lasix but had worsening renal function so nephrology was consulted to assist. Creatinine was 3.44 on admission which is around baseline. However, creatinine continued to slowly increase with diuresis, creatinine peaked at 4.73. Nephrology was consulted and this was felt to be due to cardiorenal syndrome. Renal ultrasound showed possible non-obstructive right renal calculus but otherwise unremarkable. RHC was not done during admission. Nephrology assisted w/ diuretic dose adjustment.  Ultimately diuresed 11 lb. Discharge weight 259 lbs (down from 281 lbs on admission). Nephrology recommended discharging on Lasix 40mg  twice daily (previously on 20mg  twice daily). No ACEi/ARB/ARNI or MRA given renal function. Home Losartan stopped due to renal function and Coreg stopped due to bradycardia. She was started on Imdur 90mg  daily. Continued on Hydralazine 100mg  three times daily and Amlodipine 10mg  daily. SCr day of discharge was 4.67, K 4.0. GFR 10.   She [facility] . Due to difficulty determining volume status she was set up for RHC. RHC showed elevated filling pressures. Lasix was increased to 80 mg twice a day   Today she returns for HF follow up wit her  daughter/ Says she went on vacation last week and was drinking more fluids than usual. Overall feeling fine. Mild SOB with exertion.  Denies PND/Orthopnea. Appetite ok. She has been eating pizza a little more often. No fever or chills. Weight at home  250 -->256.7 pounds.Taking all medications.   Cardiac Test Echo 09/2023 EF 35-40% RV moderately reduced.   RHC 10/25/2023  RA:       20 mmHg (mean) RV:       85/10,  mmHg PA:       85/30 mmHg (48 mean) PCWP: 30 mmHg (mean) with v waves to 45                                     Estimated Fick CO/CI   9.74L/min, 4.62L/min/m2                                        TPG  18  mmHg                                               PVR  1.8 Wood Units  PAPi  2.75   IMPRESSION: Right heart catheterization for worsening renal function. Elevated cardiac output by assumed Fick, high SVC sat with no evidence of shunt. Severely elevated biventricular filling pressures.  Severe  primarily group II pulmonary hypertension. Elevated BP during the case. Lasix was increased to 80 mg twice a day.   ROS: All systems negative except as listed in HPI, PMH and Problem List.  SH:  Social History   Socioeconomic History   Marital status: Divorced    Spouse name: Not on file   Number of children: 2   Years of education: Not on file   Highest education level: Some college, no degree  Occupational History   Occupation: retired  Tobacco Use   Smoking status: Former    Current packs/day: 0.00    Types: Cigarettes    Quit date: 08/29/2010    Years since quitting: 13.2   Smokeless tobacco: Never  Vaping Use   Vaping status: Never Used  Substance and Sexual Activity   Alcohol use: No    Alcohol/week: 5.0 standard drinks of alcohol    Types: 5 Glasses of wine per week   Drug use: No   Sexual activity: Yes    Birth control/protection: Surgical  Other Topics Concern   Not on file  Social History Narrative   Regular Exercise- yes   Social Drivers of Health    Financial Resource Strain: Low Risk  (09/30/2023)   Overall Financial Resource Strain (CARDIA)    Difficulty of Paying Living Expenses: Not very hard  Food Insecurity: No Food Insecurity (09/27/2023)   Hunger Vital Sign    Worried About Running Out of Food in the Last Year: Never true    Ran Out of Food in the Last Year: Never true  Transportation Needs: No Transportation Needs (09/30/2023)   PRAPARE - Administrator, Civil Service (Medical): No    Lack of Transportation (Non-Medical): No  Recent Concern: Transportation Needs - Unmet Transportation Needs (09/27/2023)   PRAPARE - Administrator, Civil Service (Medical): Yes    Lack of Transportation (Non-Medical): No  Physical Activity: Unknown (09/06/2023)   Exercise Vital Sign    Days of Exercise per Week: 0 days    Minutes of Exercise per Session: Not on file  Stress: No Stress Concern Present (09/06/2023)   [employer] of Occupational Health - Occupational Stress Questionnaire    Feeling of Stress : Not at all  Social Connections: Moderately Integrated (09/27/2023)   Social Connection and Isolation Panel [NHANES]    Frequency of Communication with Friends and Family: More than three times a week    Frequency of Social Gatherings with Friends and Family: Twice a week    Attends Religious Services: More than 4 times per year    Active Member of [employer] or Organizations: Yes    Attends Banker Meetings: Not on file    Marital Status: Divorced  Recent Concern: Social Connections - Moderately Isolated (09/06/2023)   Social Connection and Isolation Panel [NHANES]    Frequency of Communication with Friends and Family: More than three times a week    Frequency of Social Gatherings with Friends and Family: Once a week    Attends Religious Services: More than 4 times per year    Active Member of [employer] or Organizations: No    Attends Banker Meetings: Not on file    Marital Status: Divorced   Intimate Partner Violence: Not At Risk (09/27/2023)   Humiliation, Afraid, Rape, and Kick questionnaire    Fear of Current or Ex-Partner: No    Emotionally Abused: No    Physically Abused: No    Sexually Abused: No  FH:  Family History  Problem Relation Age of Onset   Heart attack Mother    Hypertension Mother    Cancer Father    Hypertension Brother    Hypertension Maternal Grandmother    Hypertension Maternal Grandfather    Diabetes Other    Hypertension Other    Stroke Other    Cancer Other        colon; prostate   Kidney disease Neg Hx    Alcohol abuse Neg Hx    Asthma Neg Hx    COPD Neg Hx    Depression Neg Hx    Drug abuse Neg Hx    Early death Neg Hx    Hearing loss Neg Hx    Heart disease Neg Hx    Hyperlipidemia Neg Hx     Past Medical History:  Diagnosis Date   Allergic rhinitis    Asthma    Atypical chest pain    a. 03/2014: normal nuclear stress test.   Chronic diastolic CHF (congestive heart failure) (HCC)    a. Dx 03/2014 with echo - moderate LVH, EF 55-60%, grade 1 diastolic dysfunction, mildly dilated LA.   Chronic diastolic heart failure (HCC) 03/07/2021   CKD (chronic kidney disease), stage II    Diabetes mellitus type II    GERD (gastroesophageal reflux disease)    Headache(784.0)    when my bp is up   Hypertension    Hypertensive heart disease    Microcytic anemia    OSA (obstructive sleep apnea)    severe with AHI 77/hr with nocturnal hypoxemia   Osteoarthritis    Right upper quadrant pain 06/04/2022    Current Outpatient Medications  Medication Sig Dispense Refill   amLODipine (NORVASC) 10 MG tablet Take 1 tablet (10 mg total) by mouth daily. 90 tablet 3   atorvastatin (LIPITOR) 20 MG tablet Take 1 tablet (20 mg total) by mouth daily. 90 tablet 3   blood glucose meter kit and supplies Dispense based on patient and insurance preference. Use up to four times daily as directed. (FOR ICD-10 E10.9, E11.9). 1 each 0   Continuous Glucose  Sensor (DEXCOM G7 SENSOR) MISC 1 Device by Does not apply route as directed. 9 each 3   ferrous sulfate 325 (65 FE) MG tablet Take 1 tablet (325 mg total) by mouth daily with breakfast. 30 tablet 1   furosemide (LASIX) 80 MG tablet Take 1 tablet (80 mg total) by mouth 2 (two) times daily. 60 tablet 11   hydrALAZINE (APRESOLINE) 100 MG tablet Take 1 tablet (100 mg total) by mouth 3 (three) times daily. 270 tablet 1   insulin aspart (NOVOLOG FLEXPEN) 100 UNIT/ML FlexPen Continuing sliding scale as previously prescribed. (Patient taking differently: Inject 0-2 Units into the skin daily as needed for high blood sugar. Sliding scale  Take 2 units if blood Glucose is over 200)     insulin glargine (LANTUS SOLOSTAR) 100 UNIT/ML Solostar Pen Inject 50 Units into the skin daily. In the afternoon 15 mL 1   Insulin Pen Needle 32G X 4 MM MISC 1 Device by Does not apply route in the morning, at noon, in the evening, and at bedtime. 400 each 2   isosorbide mononitrate (IMDUR) 120 MG 24 hr tablet Take 1 tablet (120 mg total) by mouth daily. 90 tablet 3   No current facility-administered medications for this encounter.    Vitals:   11/08/23 1349  BP: (!) 154/86  Pulse: 96  SpO2: 95%  Weight:  117.2 kg (258 lb 6.4 oz)   Wt Readings from Last 3 Encounters:  11/08/23 117.2 kg (258 lb 6.4 oz)  10/25/23 113.9 kg (251 lb)  10/11/23 119.3 kg (263 lb)    PHYSICAL EXAM: General:   No resp difficulty. Arrived in wheelchair.  Neck: supple. JVP 9-10   Cor: PMI nondisplaced. Regular rate & rhythm. No rubs, gallops or murmurs. Lungs: clear Abdomen: soft, nontender, nondistended.  Extremities: no cyanosis, clubbing, rash, R and LLE trace-1+ edema Neuro: alert & oriented x3  EKG: SR 62 bpm personally checked.   ASSESSMENT & PLAN:  1. Chronic Biventricular HFrEF - new diagnosis, 3/25 LVEF down 35-40% with RV moderately reduced. Previous ECHO LVEF 60-65%.  - Cause for drop in EF uncertain. Has risk factors for  CAD but no recent CP and also not candidate for LHC given CKD IV. Gen cards considering cardiac PET for nonivasive ischemic testing/ risk stratification  -RHC 10/25/23 showed elevated filling pressure. Placed on higher dose of lasix 80 /80.  - NYHA III. ReDs reading: 52 %, abnormal. Suggestive of fluid accumulation. Suspect elevated in the setting of high sodium diet.  - Increase morning lasix to 120 mg for 2 days and continue lasix 80 mg in pm. After 2 days cut back lasix to 80 mg twice a day. Down the road could consider switching to torsemide.  - GDMT limited by renal fx. No MRA, ARNi   - continue Hydral 100 tid  - continue Imdur 90 mg daily.   -Check BMET and BNP    2. CKD  IV: longstanding CKD. Previous baseline appears ~ 3.5. Up to 4.6 most recently - suspect cardiorenal -she is followed by [facility] (Dr. Yvonnie Heritage) will need to consult w/ MD to see if able to restart on SGLT2i (previously on Farxiga)  - Needs follow up with Dr Yvonnie Heritage.  Discussed avoiding NSAIDs.  - Check BMET    3. Hypertension  -Elevated but she has not had mid day hydralazine. Continue current regimen.  - hydralazine 100 tid - Imdur 90 mg daily - amlodipine 10 mg daily    4. OSA - followed by Dr. Micael Adas  - on CPAP    Follow up as needed. Back to Dr Theodis Fiscal.   Savio Albrecht NP-C  2:19 PM

## 2023-11-09 ENCOUNTER — Other Ambulatory Visit (HOSPITAL_BASED_OUTPATIENT_CLINIC_OR_DEPARTMENT_OTHER): Payer: Self-pay

## 2023-11-11 ENCOUNTER — Other Ambulatory Visit (HOSPITAL_BASED_OUTPATIENT_CLINIC_OR_DEPARTMENT_OTHER): Payer: Self-pay

## 2023-11-18 ENCOUNTER — Telehealth: Payer: Self-pay | Admitting: Cardiovascular Disease

## 2023-11-18 DIAGNOSIS — G4730 Sleep apnea, unspecified: Secondary | ICD-10-CM

## 2023-11-18 DIAGNOSIS — I5032 Chronic diastolic (congestive) heart failure: Secondary | ICD-10-CM

## 2023-11-18 DIAGNOSIS — I10 Essential (primary) hypertension: Secondary | ICD-10-CM

## 2023-11-18 NOTE — Telephone Encounter (Signed)
 Pt requesting cb regarding needing prescription for her cpap equipment-head gear, mask, tubing, filters

## 2023-11-23 ENCOUNTER — Other Ambulatory Visit (HOSPITAL_BASED_OUTPATIENT_CLINIC_OR_DEPARTMENT_OTHER): Payer: Self-pay

## 2023-11-23 NOTE — Telephone Encounter (Signed)
 Order placed to [facility] for cpap supplies  Upon patient request DME selection is [facility] Patient understands he will be contacted by [facility] to set up his cpap. Patient understands to call if [facility] does not contact him with new setup in a timely manner. Patient understands they will be called once confirmation has been received from [facility] that they have received their new machine to schedule 10 week follow up appointment.   [facility] notified of new cpap order  Please add to airview Patient was grateful for the call and thanked me

## 2023-11-24 ENCOUNTER — Telehealth: Payer: Self-pay | Admitting: Family

## 2023-11-24 ENCOUNTER — Other Ambulatory Visit: Payer: Self-pay | Admitting: Family

## 2023-11-24 DIAGNOSIS — E118 Type 2 diabetes mellitus with unspecified complications: Secondary | ICD-10-CM

## 2023-11-24 DIAGNOSIS — N1832 Chronic kidney disease, stage 3b: Secondary | ICD-10-CM

## 2023-11-24 NOTE — Telephone Encounter (Signed)
 Copied from CRM [id number]. Topic: Referral - Request for Referral >> Nov 24, 2023 11:09 AM [name] wrote: Did the patient discuss referral with their provider in the last year? Yes (If No - schedule appointment) (If Yes - send message)  Appointment offered? Yes  Type of order/referral and detailed reason for visit: Former Actor dismissed her, so wanted to fine one that was closer to her   Preference of office, provider, location:   [facility] - [facility] [address]  [address]  [place], [place] [address] [phone]  [phone] Lori Mclaughlin)   If referral order, have you been seen by this specialty before? Yes (If Yes, this issue or another issue? When? Where?  Can we respond through MyChart? Yes

## 2023-11-25 DIAGNOSIS — H3582 Retinal ischemia: Secondary | ICD-10-CM | POA: Diagnosis not present

## 2023-11-25 DIAGNOSIS — H35033 Hypertensive retinopathy, bilateral: Secondary | ICD-10-CM | POA: Diagnosis not present

## 2023-11-25 DIAGNOSIS — H31093 Other chorioretinal scars, bilateral: Secondary | ICD-10-CM | POA: Diagnosis not present

## 2023-11-25 DIAGNOSIS — E113513 Type 2 diabetes mellitus with proliferative diabetic retinopathy with macular edema, bilateral: Secondary | ICD-10-CM | POA: Diagnosis not present

## 2023-11-25 LAB — HM DIABETES EYE EXAM

## 2023-11-26 DIAGNOSIS — G4733 Obstructive sleep apnea (adult) (pediatric): Secondary | ICD-10-CM | POA: Diagnosis not present

## 2023-12-29 ENCOUNTER — Telehealth: Payer: Self-pay

## 2023-12-29 ENCOUNTER — Other Ambulatory Visit: Payer: Self-pay | Admitting: Cardiovascular Disease

## 2023-12-29 ENCOUNTER — Other Ambulatory Visit (HOSPITAL_BASED_OUTPATIENT_CLINIC_OR_DEPARTMENT_OTHER): Payer: Self-pay

## 2023-12-29 NOTE — Telephone Encounter (Signed)
 Copied from CRM [id number]. Topic: Referral - Status >> Dec 29, 2023  2:38 PM [place] wrote: Reason for CRM: [facility] please fax the referral to an updated number # [phone]

## 2023-12-30 ENCOUNTER — Other Ambulatory Visit (HOSPITAL_BASED_OUTPATIENT_CLINIC_OR_DEPARTMENT_OTHER): Payer: Self-pay

## 2023-12-30 MED ORDER — FERROUS SULFATE 325 (65 FE) MG PO TABS
325.0000 mg | ORAL_TABLET | Freq: Every day | ORAL | 0 refills | Status: DC
  Filled 2023-12-30: qty 30, 30d supply, fill #0

## 2024-02-14 ENCOUNTER — Other Ambulatory Visit (HOSPITAL_BASED_OUTPATIENT_CLINIC_OR_DEPARTMENT_OTHER): Payer: Self-pay

## 2024-02-14 ENCOUNTER — Other Ambulatory Visit: Payer: Self-pay | Admitting: Family

## 2024-02-17 ENCOUNTER — Encounter: Payer: Self-pay | Admitting: Family

## 2024-02-17 DIAGNOSIS — H35033 Hypertensive retinopathy, bilateral: Secondary | ICD-10-CM | POA: Diagnosis not present

## 2024-02-17 DIAGNOSIS — E113513 Type 2 diabetes mellitus with proliferative diabetic retinopathy with macular edema, bilateral: Secondary | ICD-10-CM | POA: Diagnosis not present

## 2024-02-17 DIAGNOSIS — H3582 Retinal ischemia: Secondary | ICD-10-CM | POA: Diagnosis not present

## 2024-02-17 DIAGNOSIS — H31093 Other chorioretinal scars, bilateral: Secondary | ICD-10-CM | POA: Diagnosis not present

## 2024-02-18 ENCOUNTER — Emergency Department (HOSPITAL_BASED_OUTPATIENT_CLINIC_OR_DEPARTMENT_OTHER)

## 2024-02-18 ENCOUNTER — Emergency Department (HOSPITAL_BASED_OUTPATIENT_CLINIC_OR_DEPARTMENT_OTHER): Admission: EM | Admit: 2024-02-18 | Discharge: 2024-02-18 | Disposition: A | Discharge status: Home / Self Care

## 2024-02-18 ENCOUNTER — Other Ambulatory Visit (HOSPITAL_BASED_OUTPATIENT_CLINIC_OR_DEPARTMENT_OTHER): Payer: Self-pay

## 2024-02-18 ENCOUNTER — Other Ambulatory Visit: Payer: Self-pay

## 2024-02-18 ENCOUNTER — Encounter (HOSPITAL_BASED_OUTPATIENT_CLINIC_OR_DEPARTMENT_OTHER): Payer: Self-pay | Admitting: Emergency Medicine

## 2024-02-18 DIAGNOSIS — Z91040 Latex allergy status: Secondary | ICD-10-CM | POA: Diagnosis not present

## 2024-02-18 DIAGNOSIS — Z794 Long term (current) use of insulin: Secondary | ICD-10-CM | POA: Insufficient documentation

## 2024-02-18 DIAGNOSIS — M109 Gout, unspecified: Secondary | ICD-10-CM | POA: Diagnosis not present

## 2024-02-18 DIAGNOSIS — M79675 Pain in left toe(s): Secondary | ICD-10-CM | POA: Diagnosis present

## 2024-02-18 MED ORDER — PREDNISONE 20 MG PO TABS
60.0000 mg | ORAL_TABLET | Freq: Every day | ORAL | 0 refills | Status: AC
  Filled 2024-02-18: qty 15, 5d supply, fill #0

## 2024-02-18 MED ORDER — ACETAMINOPHEN 500 MG PO TABS
1000.0000 mg | ORAL_TABLET | Freq: Once | ORAL | Status: AC
  Administered 2024-02-18: 1000 mg via ORAL
  Filled 2024-02-18: qty 2

## 2024-02-18 NOTE — ED Triage Notes (Signed)
 Gout flare to left great toe/inner aspect of left foot

## 2024-02-18 NOTE — ED Provider Notes (Signed)
 [place] EMERGENCY DEPARTMENT AT [facility] [facility] Provider Note   CSN: [id number] Arrival date & time: 02/18/24  9286     Patient presents with: Foot Pain (? GOUT)   Lori Mclaughlin is a 63 y.o. female.   63 year old female history of gout presented to the emergency department with pain to her left great toe and first MCP.  Symptoms started Wednesday and have worsened.  She was on allopurinol , reports last hospitalization she was taken off of it for unclear reason.  Reports feeling similar to prior gout flares.  No fevers or chills.  No numbness tingling changes in sensation.   Foot Pain       Prior to Admission medications   Medication Sig Start Date End Date Taking? Authorizing Provider  predniSONE (DELTASONE) 20 MG tablet Take 3 tablets (60 mg total) by mouth daily with breakfast for 5 days. 02/18/24 02/23/24 Yes Serrena Linderman, Caron PARAS, DO  amLODipine  (NORVASC ) 10 MG tablet Take 1 tablet (10 mg total) by mouth daily. 10/25/23   Raford Riggs, MD  atorvastatin  (LIPITOR) 20 MG tablet Take 1 tablet (20 mg total) by mouth daily. 10/25/23   Raford Riggs, MD  blood glucose meter kit and supplies Dispense based on patient and insurance preference. Use up to four times daily as directed. (FOR ICD-10 E10.9, E11.9). 03/24/22   Jason Leita Repine, FNP  Continuous Glucose Sensor (DEXCOM G7 SENSOR) MISC 1 Device by Does not apply route as directed. 07/16/23   Shamleffer, Donell Cardinal, MD  ferrous sulfate  (FEROSUL) 325 (65 FE) MG tablet Take 1 tablet (325 mg total) by mouth daily with breakfast. *OK to refill one time; in the future this needs to be filled by primary care provider.*   TCR 12/30/23   Raford Riggs, MD  furosemide  (LASIX ) 80 MG tablet Take 1 tablet (80 mg total) by mouth 2 (two) times daily. You may take an extra 40mg  (1/2 tablet) by mouth daily as needed for swelling. 11/08/23   Clegg, Amy D, NP  hydrALAZINE  (APRESOLINE ) 100 MG tablet Take 1 tablet (100 mg  total) by mouth 3 (three) times daily. 10/25/23   Raford Riggs, MD  insulin  aspart (NOVOLOG  FLEXPEN) 100 UNIT/ML FlexPen Continuing sliding scale as previously prescribed. Patient taking differently: Inject 0-2 Units into the skin daily as needed for high blood sugar. Sliding scale  Take 2 units if blood Glucose is over 200 10/05/23   Goodrich, Callie E, PA-C  insulin  glargine (LANTUS  SOLOSTAR) 100 UNIT/ML Solostar Pen Inject 50 Units into the skin daily. In the afternoon 08/09/23   Prosperi, Christian H, PA-C  Insulin  Pen Needle 32G X 4 MM MISC 1 Device by Does not apply route in the morning, at noon, in the evening, and at bedtime. 12/10/22   Shamleffer, Ibtehal Jaralla, MD  isosorbide  mononitrate (IMDUR ) 120 MG 24 hr tablet Take 1 tablet (120 mg total) by mouth daily. 10/25/23   Zenaida Morene PARAS, MD    Allergies: Latex and Lisinopril    Review of Systems  Updated Vital Signs BP (!) 171/64   Pulse 60   Temp 98.7 F (37.1 C) (Oral)   Resp 18   Ht 5' (1.524 m)   Wt 116.1 kg   LMP 02/23/2012   SpO2 95%   BMI 50.00 kg/m   Physical Exam Vitals and nursing note reviewed.  Constitutional:      General: She is not in acute distress. HENT:     Head: Normocephalic.     Nose: Nose  normal.     Mouth/Throat:     Mouth: Mucous membranes are moist.  Eyes:     Conjunctiva/sclera: Conjunctivae normal.  Cardiovascular:     Rate and Rhythm: Normal rate and regular rhythm.  Pulmonary:     Effort: Pulmonary effort is normal.  Abdominal:     General: Abdomen is flat.  Musculoskeletal:     Comments: Gouty tophi to the left first MCP.  2+ DP pulses bilaterally.  Brisk cap refill.  Skin:    Capillary Refill: Capillary refill takes less than 2 seconds.  Neurological:     Mental Status: She is alert.  Psychiatric:        Mood and Affect: Mood normal.        Behavior: Behavior normal.     (all labs ordered are listed, but only abnormal results are displayed) Labs Reviewed - No data to  display  EKG: None  Radiology: DG Foot 2 Views Left Result Date: 02/18/2024 CLINICAL DATA:  First toe pain EXAM: LEFT FOOT - 2 VIEW COMPARISON:  None Available. FINDINGS: No fracture or dislocation of mid foot or forefoot. The phalanges are normal. The calcaneus is normal. No soft tissue abnormality. IMPRESSION: No fracture or dislocation. Electronically Signed   By: Jackquline Boxer M.D.   On: 02/18/2024 08:34     Procedures   Medications Ordered in the ED  acetaminophen  (TYLENOL ) tablet 1,000 mg (1,000 mg Oral Given 02/18/24 0855)                                    Medical Decision Making 63 year old female with history of gout has been off of her allopurinol  since last hospitalization in March.  History and physical exam consistent with gout.  Did get x-ray which was normal.  Low suspicion for septic joint.  Does have CKD with a creatinine around 4 her last labs per my chart review.  NSAIDs contraindicated.  Will give short course of steroids and have patient follow-up with primary doctor to discuss restarting allopurinol .  Stable for discharge at this time.  Return precautions given.  Amount and/or Complexity of Data Reviewed Radiology: ordered.  Risk OTC drugs. Prescription drug management.      Final diagnoses:  Acute gout involving toe of left foot, unspecified cause    ED Discharge Orders          Ordered    predniSONE (DELTASONE) 20 MG tablet  Daily with breakfast        02/18/24 0843               Neysa Caron PARAS, DO 02/18/24 1207

## 2024-02-18 NOTE — Discharge Instructions (Signed)
 You may take Tylenol  every every 6-8 hours with dosages as directed on the packaging for baseline pain control.  We are also prescribing you a steroid to help with your symptoms.  It can cause some fluid retention, so you ma need to take an extra dose of your Lasix .  Please follow-up with your primary doctor soon as possible to discuss restarting allopurinol .  Return immediately for fevers, chills, chest pain, shortness of breath, severe pain or any new or worsening symptoms that are concerning to you.

## 2024-02-28 DIAGNOSIS — G4733 Obstructive sleep apnea (adult) (pediatric): Secondary | ICD-10-CM | POA: Diagnosis not present

## 2024-03-01 ENCOUNTER — Other Ambulatory Visit (HOSPITAL_BASED_OUTPATIENT_CLINIC_OR_DEPARTMENT_OTHER): Payer: Self-pay

## 2024-03-01 DIAGNOSIS — I10 Essential (primary) hypertension: Secondary | ICD-10-CM | POA: Diagnosis not present

## 2024-03-01 DIAGNOSIS — Z794 Long term (current) use of insulin: Secondary | ICD-10-CM | POA: Diagnosis not present

## 2024-03-01 DIAGNOSIS — E1165 Type 2 diabetes mellitus with hyperglycemia: Secondary | ICD-10-CM | POA: Insufficient documentation

## 2024-03-01 MED ORDER — LANTUS SOLOSTAR 100 UNIT/ML ~~LOC~~ SOPN
45.0000 [IU] | PEN_INJECTOR | Freq: Every morning | SUBCUTANEOUS | 5 refills | Status: DC
  Filled 2024-03-01: qty 15, 34d supply, fill #0
  Filled 2024-04-20 – 2024-05-12 (×2): qty 15, 34d supply, fill #1
  Filled 2024-05-26 – 2024-06-07 (×2): qty 15, 34d supply, fill #2
  Filled 2024-07-17: qty 15, 34d supply, fill #3

## 2024-03-01 MED ORDER — TRULICITY 0.75 MG/0.5ML ~~LOC~~ SOAJ
0.7500 mg | SUBCUTANEOUS | 0 refills | Status: DC
  Filled 2024-03-01: qty 2, 28d supply, fill #0

## 2024-03-01 MED ORDER — DEXCOM G7 SENSOR MISC
1 refills | Status: DC
  Filled 2024-03-01: qty 3, 30d supply, fill #0
  Filled 2024-04-11: qty 3, 30d supply, fill #1

## 2024-03-07 ENCOUNTER — Telehealth: Payer: Self-pay | Admitting: *Deleted

## 2024-03-07 DIAGNOSIS — I5043 Acute on chronic combined systolic (congestive) and diastolic (congestive) heart failure: Secondary | ICD-10-CM

## 2024-03-07 DIAGNOSIS — I5032 Chronic diastolic (congestive) heart failure: Secondary | ICD-10-CM

## 2024-03-07 DIAGNOSIS — I10 Essential (primary) hypertension: Secondary | ICD-10-CM

## 2024-03-07 DIAGNOSIS — N184 Chronic kidney disease, stage 4 (severe): Secondary | ICD-10-CM

## 2024-03-07 DIAGNOSIS — N1832 Chronic kidney disease, stage 3b: Secondary | ICD-10-CM

## 2024-03-07 DIAGNOSIS — E118 Type 2 diabetes mellitus with unspecified complications: Secondary | ICD-10-CM

## 2024-03-07 NOTE — Progress Notes (Signed)
 Complex Care Management Note  Care Guide Note 03/07/2024 Name: Lori Mclaughlin MRN: [id number] DOB: 06/25/1961  Lori Mclaughlin is a 63 y.o. year old female who sees Jason Leita Repine, FNP for primary care. I reached out to [name] by phone today to offer complex care management services.  [name] was given information about Complex Care Management services today including:   The Complex Care Management services include support from the care team which includes your Nurse Care Manager, Clinical Social Worker, or Pharmacist.  The Complex Care Management team is here to help remove barriers to the health concerns and goals most important to you. Complex Care Management services are voluntary, and the patient may decline or stop services at any time by request to their care team member.   Complex Care Management Consent Status: Patient agreed to services and verbal consent obtained.   Follow up plan:  Telephone appointment with complex care management team member scheduled for:  03/08/24  Encounter Outcome:  Patient Scheduled  Harlene Satterfield  [place] [facility]  [facility] Guide  Direct Dial: [phone]  Fax [phone]

## 2024-03-08 ENCOUNTER — Other Ambulatory Visit: Payer: Self-pay | Admitting: *Deleted

## 2024-03-08 NOTE — Patient Outreach (Signed)
 Complex Care Management   Visit Note  03/08/2024  Name:  Lori Mclaughlin MRN: [id number] DOB: Apr 29, 1961  RNCM spoke with patient today and introduced VBCI service and purpose for today's call. Patient receptive however requested a call back indicating she was not feeling well today. RNCM inquired further and offered to assist further however patient declined indicating she just wanted to rest.   Based upon patient's request will alert the care guide to follow up with pt on another day and reschedule this initial telephone visit. No further request from patient at this time.   Olam Ku, RN, BSN [place]  [facility] RN Care Manager Direct Dial: [phone]  Fax: [phone]

## 2024-03-22 ENCOUNTER — Other Ambulatory Visit (HOSPITAL_BASED_OUTPATIENT_CLINIC_OR_DEPARTMENT_OTHER): Payer: Self-pay

## 2024-03-22 MED ORDER — TRULICITY 1.5 MG/0.5ML ~~LOC~~ SOAJ
1.5000 mg | SUBCUTANEOUS | 2 refills | Status: DC
  Filled 2024-03-22: qty 2, 28d supply, fill #0
  Filled 2024-04-20 – 2024-05-26 (×2): qty 2, 28d supply, fill #1

## 2024-03-29 ENCOUNTER — Telehealth: Payer: Self-pay

## 2024-03-29 NOTE — Progress Notes (Signed)
 Complex Care Management Note Care Guide Note  03/29/2024 Name: Lori Mclaughlin MRN: [id number] DOB: [birth year]   Complex Care Management Outreach Attempts: An unsuccessful telephone outreach was attempted today to offer the patient information about available complex care management services.  Follow Up Plan:  Additional outreach attempts will be made to offer the patient complex care management information and services.   Encounter Outcome:  No Answer  Dreama [facility]  [facility], [facility] VBCI Assistant Direct Dial: [phone]  Fax: [phone]

## 2024-04-03 NOTE — Progress Notes (Signed)
 Complex Care Management Note Care Guide Note  04/03/2024 Name: Lori Mclaughlin MRN: [id number] DOB: [birth year]   Complex Care Management Outreach Attempts: A second unsuccessful outreach was attempted today to offer the patient with information about available complex care management services.  Follow Up Plan:  No further outreach attempts will be made at this time. We have been unable to contact the patient to offer or enroll patient in complex care management services.  Encounter Outcome:  No Answer  Dreama [facility]  [facility], [facility] VBCI Assistant Direct Dial: [phone]  Fax: [phone]

## 2024-04-11 ENCOUNTER — Other Ambulatory Visit: Payer: Self-pay | Admitting: Cardiovascular Disease

## 2024-04-11 ENCOUNTER — Other Ambulatory Visit: Payer: Self-pay

## 2024-04-11 ENCOUNTER — Other Ambulatory Visit (HOSPITAL_BASED_OUTPATIENT_CLINIC_OR_DEPARTMENT_OTHER): Payer: Self-pay

## 2024-04-11 MED ORDER — HYDRALAZINE HCL 100 MG PO TABS
100.0000 mg | ORAL_TABLET | Freq: Three times a day (TID) | ORAL | 1 refills | Status: DC
  Filled 2024-04-11: qty 270, 90d supply, fill #0
  Filled 2024-04-20 – 2024-07-17 (×2): qty 270, 90d supply, fill #1

## 2024-04-12 ENCOUNTER — Other Ambulatory Visit: Payer: Self-pay

## 2024-04-20 ENCOUNTER — Other Ambulatory Visit (HOSPITAL_BASED_OUTPATIENT_CLINIC_OR_DEPARTMENT_OTHER): Payer: Self-pay

## 2024-04-21 ENCOUNTER — Other Ambulatory Visit (HOSPITAL_BASED_OUTPATIENT_CLINIC_OR_DEPARTMENT_OTHER): Payer: Self-pay

## 2024-04-24 ENCOUNTER — Other Ambulatory Visit (HOSPITAL_BASED_OUTPATIENT_CLINIC_OR_DEPARTMENT_OTHER): Payer: Self-pay

## 2024-04-25 ENCOUNTER — Other Ambulatory Visit (HOSPITAL_BASED_OUTPATIENT_CLINIC_OR_DEPARTMENT_OTHER): Payer: Self-pay

## 2024-04-26 ENCOUNTER — Other Ambulatory Visit (HOSPITAL_BASED_OUTPATIENT_CLINIC_OR_DEPARTMENT_OTHER): Payer: Self-pay

## 2024-04-27 ENCOUNTER — Other Ambulatory Visit (HOSPITAL_BASED_OUTPATIENT_CLINIC_OR_DEPARTMENT_OTHER): Payer: Self-pay

## 2024-04-28 ENCOUNTER — Other Ambulatory Visit (HOSPITAL_BASED_OUTPATIENT_CLINIC_OR_DEPARTMENT_OTHER): Payer: Self-pay

## 2024-04-30 ENCOUNTER — Other Ambulatory Visit (HOSPITAL_BASED_OUTPATIENT_CLINIC_OR_DEPARTMENT_OTHER): Payer: Self-pay

## 2024-05-01 ENCOUNTER — Other Ambulatory Visit (HOSPITAL_BASED_OUTPATIENT_CLINIC_OR_DEPARTMENT_OTHER): Payer: Self-pay

## 2024-05-02 ENCOUNTER — Other Ambulatory Visit (HOSPITAL_BASED_OUTPATIENT_CLINIC_OR_DEPARTMENT_OTHER): Payer: Self-pay

## 2024-05-03 ENCOUNTER — Other Ambulatory Visit (HOSPITAL_BASED_OUTPATIENT_CLINIC_OR_DEPARTMENT_OTHER): Payer: Self-pay

## 2024-05-04 ENCOUNTER — Other Ambulatory Visit (HOSPITAL_BASED_OUTPATIENT_CLINIC_OR_DEPARTMENT_OTHER): Payer: Self-pay

## 2024-05-05 ENCOUNTER — Other Ambulatory Visit (HOSPITAL_BASED_OUTPATIENT_CLINIC_OR_DEPARTMENT_OTHER): Payer: Self-pay

## 2024-05-08 ENCOUNTER — Ambulatory Visit: Admitting: Family Medicine

## 2024-05-08 ENCOUNTER — Other Ambulatory Visit (HOSPITAL_BASED_OUTPATIENT_CLINIC_OR_DEPARTMENT_OTHER): Payer: Self-pay

## 2024-05-08 ENCOUNTER — Encounter: Payer: Self-pay | Admitting: Family Medicine

## 2024-05-08 VITALS — BP 162/85 | HR 78 | Ht 60.0 in | Wt 252.0 lb

## 2024-05-08 DIAGNOSIS — E118 Type 2 diabetes mellitus with unspecified complications: Secondary | ICD-10-CM

## 2024-05-08 DIAGNOSIS — N1832 Chronic kidney disease, stage 3b: Secondary | ICD-10-CM | POA: Diagnosis not present

## 2024-05-08 DIAGNOSIS — Z794 Long term (current) use of insulin: Secondary | ICD-10-CM | POA: Diagnosis not present

## 2024-05-08 DIAGNOSIS — Z0000 Encounter for general adult medical examination without abnormal findings: Secondary | ICD-10-CM | POA: Diagnosis not present

## 2024-05-08 DIAGNOSIS — H6121 Impacted cerumen, right ear: Secondary | ICD-10-CM

## 2024-05-08 DIAGNOSIS — I509 Heart failure, unspecified: Secondary | ICD-10-CM

## 2024-05-08 DIAGNOSIS — Z23 Encounter for immunization: Secondary | ICD-10-CM | POA: Diagnosis not present

## 2024-05-08 DIAGNOSIS — I10 Essential (primary) hypertension: Secondary | ICD-10-CM

## 2024-05-08 MED ORDER — BLOOD GLUCOSE TEST VI STRP
1.0000 | ORAL_STRIP | Freq: Three times a day (TID) | 0 refills | Status: DC
  Filled 2024-05-08: qty 100, 34d supply, fill #0

## 2024-05-08 MED ORDER — ACCU-CHEK SOFTCLIX LANCETS MISC
1.0000 | Freq: Three times a day (TID) | 0 refills | Status: DC
  Filled 2024-05-08: qty 100, 30d supply, fill #0

## 2024-05-08 MED ORDER — LANCET DEVICE MISC
1.0000 | Freq: Three times a day (TID) | 0 refills | Status: AC
  Filled 2024-05-08: qty 1, 30d supply, fill #0

## 2024-05-08 MED ORDER — BLOOD GLUCOSE METER KIT
1.0000 | PACK | Freq: Three times a day (TID) | 0 refills | Status: DC
  Filled 2024-05-08: qty 1, 30d supply, fill #0

## 2024-05-08 NOTE — Patient Instructions (Addendum)
 Earwax removal drops: Debrox drops   Blood pressure is not at goal for age and co-morbidities.  Reports she ate poorly this weekend and has not taken her medications yet today. Recommendations: continue current meds, take as directed. Monitor BP at home and notify cardiology if consistently >140/90 - BP goal <130/80 - monitor and log blood pressures at home - check around the same time each day in a relaxed setting - Limit salt to <2000 mg/day - Follow DASH eating plan (heart healthy diet) - limit alcohol to 2 standard drinks per day for men and 1 per day for women - avoid tobacco products - get at least 2 hours of regular aerobic exercise weekly Patient aware of signs/symptoms requiring further/urgent evaluation. Labs updated today.

## 2024-05-08 NOTE — Assessment & Plan Note (Signed)
 Update labs today. Avoid nephrotoxic agents.

## 2024-05-08 NOTE — Progress Notes (Addendum)
 Complete physical exam  Patient: Lori Mclaughlin   DOB: May 15, 1961   63 y.o. Female  MRN: [id number]  Subjective:    Chief Complaint  Patient presents with   Establish Care    Lori Mclaughlin is a 63 y.o. female who presents today for a complete physical exam. She reports consuming a general diet. The patient does not participate in regular exercise at present. She generally feels well. She reports sleeping well. She does not have additional problems to discuss today.   Currently lives with: daughter, grandsons Acute concerns or interim problems since last visit: no  Following with [facility] for diabetes. She saw them in August. A1c at that time was 7.8%.  She sees cardiology about once a year. No new concerns. Not checking BP regularly. Admits to eating poorly this weekend. States chronic edema is at baseline left leg worse than right. She has not taken any medications today.   Vision concerns: no Dental concerns: no  ETOH use: no Nicotine use: no Recreational drugs/illegal substances: no       Most recent fall risk assessment:    05/08/2024    1:47 PM  Fall Risk   Falls in the past year? 0  Number falls in past yr: 0  Injury with Fall? 0  Risk for fall due to : No Fall Risks  Follow up Falls evaluation completed     Most recent depression screenings:    05/08/2024    1:47 PM 09/07/2023    1:24 PM  PHQ 2/9 Scores  PHQ - 2 Score 0 0  PHQ- 9 Score 4             Patient Care Team: Almarie Lori NOVAK, NP as PCP - General (Family Medicine) Raford Riggs, MD as PCP - Cardiology (Cardiology)   Outpatient Medications Prior to Visit  Medication Sig   amLODipine  (NORVASC ) 10 MG tablet Take 1 tablet (10 mg total) by mouth daily.   atorvastatin  (LIPITOR) 20 MG tablet Take 1 tablet (20 mg total) by mouth daily.   blood glucose meter kit and supplies Dispense based on patient and insurance preference. Use up to four times daily as  directed. (FOR ICD-10 E10.9, E11.9).   Continuous Glucose Sensor (DEXCOM G7 SENSOR) MISC Use as directed; Place 1 sensor every 10 days   Dulaglutide  (TRULICITY ) 1.5 MG/0.5ML SOAJ Inject 1.5 mg into the skin once a week.   furosemide  (LASIX ) 80 MG tablet Take 1 tablet (80 mg total) by mouth 2 (two) times daily. You may take an extra 40mg  (1/2 tablet) by mouth daily as needed for swelling.   hydrALAZINE  (APRESOLINE ) 100 MG tablet Take 1 tablet (100 mg total) by mouth 3 (three) times daily.   insulin  aspart (NOVOLOG  FLEXPEN) 100 UNIT/ML FlexPen Continuing sliding scale as previously prescribed. (Patient taking differently: Inject 0-2 Units into the skin daily as needed for high blood sugar. Sliding scale  Take 2 units if blood Glucose is over 200)   insulin  glargine (LANTUS  SOLOSTAR) 100 UNIT/ML Solostar Pen Inject 45 Units into the skin in the morning.   Insulin  Pen Needle 32G X 4 MM MISC 1 Device by Does not apply route in the morning, at noon, in the evening, and at bedtime.   isosorbide  mononitrate (IMDUR ) 120 MG 24 hr tablet Take 1 tablet (120 mg total) by mouth daily.   [DISCONTINUED] Continuous Glucose Sensor (DEXCOM G7 SENSOR) MISC 1 Device by Does not apply route as directed.   [DISCONTINUED] ferrous  sulfate (FEROSUL) 325 (65 FE) MG tablet Take 1 tablet (325 mg total) by mouth daily with breakfast. *OK to refill one time; in the future this needs to be filled by primary care provider.*   TCR   [DISCONTINUED] insulin  glargine (LANTUS  SOLOSTAR) 100 UNIT/ML Solostar Pen Inject 50 Units into the skin daily. In the afternoon   No facility-administered medications prior to visit.    ROS All review of systems negative except what is listed in the HPI        Objective:     BP (!) 162/85   Pulse 78   Ht 5' (1.524 m)   Wt 252 lb (114.3 kg)   LMP 02/23/2012   SpO2 98%   BMI 49.22 kg/m    Physical Exam Vitals reviewed.  Constitutional:      General: She is not in acute distress.     Appearance: Normal appearance. She is not ill-appearing.  HENT:     Head: Normocephalic and atraumatic.     Right Ear: There is impacted cerumen.     Left Ear: Tympanic membrane normal.     Nose: Nose normal.     Mouth/Throat:     Mouth: Mucous membranes are moist.     Pharynx: Oropharynx is clear.  Eyes:     Extraocular Movements: Extraocular movements intact.     Conjunctiva/sclera: Conjunctivae normal.     Pupils: Pupils are equal, round, and reactive to light.  Neck:     Vascular: No carotid bruit.  Cardiovascular:     Rate and Rhythm: Normal rate and regular rhythm.     Pulses: Normal pulses.     Heart sounds: Murmur heard.  Pulmonary:     Effort: Pulmonary effort is normal.     Breath sounds: Normal breath sounds.  Abdominal:     General: Abdomen is flat. Bowel sounds are normal. There is no distension.     Palpations: Abdomen is soft. There is no mass.     Tenderness: There is no abdominal tenderness. There is no right CVA tenderness, left CVA tenderness, guarding or rebound.  Genitourinary:    Comments: Deferred exam Musculoskeletal:        General: Normal range of motion.     Cervical back: Normal range of motion and neck supple. No tenderness.     Right lower leg: No edema.     Left lower leg: Edema present.  Lymphadenopathy:     Cervical: No cervical adenopathy.  Skin:    General: Skin is warm and dry.     Capillary Refill: Capillary refill takes less than 2 seconds.  Neurological:     General: No focal deficit present.     Mental Status: She is alert and oriented to person, place, and time. Mental status is at baseline.  Psychiatric:        Mood and Affect: Mood normal.        Behavior: Behavior normal.        Thought Content: Thought content normal.        Judgment: Judgment normal.      No results found for any visits on 05/08/24.     Assessment & Plan:    Routine Health Maintenance and Physical Exam Discussed health promotion and safety  including diet and exercise recommendations, dental health, and injury prevention. Tobacco cessation if applicable. Seat belts, sunscreen, smoke detectors, etc.    Immunization History  Administered Date(s) Administered   INFLUENZA, HIGH DOSE SEASONAL PF 05/08/2024  Influenza Whole 04/26/2009   Influenza, Seasonal, Injecte, Preservative Fre 09/28/2023   Influenza,inj,Quad PF,6+ Mos 06/12/2015, 04/28/2016, 03/23/2018   PFIZER(Purple Top)SARS-COV-2 Vaccination 10/07/2019, 10/28/2019   PNEUMOCOCCAL CONJUGATE-20 05/08/2024   Pneumococcal Polysaccharide-23 05/16/2009, 04/28/2016   Td 07/30/1998   Tdap 06/12/2015    Health Maintenance  Topic Date Due   Hepatitis C Screening  Never done   Diabetic kidney evaluation - Urine ACR  08/05/2010   FOOT EXAM  12/10/2023   HEMOGLOBIN A1C  03/29/2024   Zoster Vaccines- Shingrix (1 of 2) 08/08/2024 (Originally 04/18/2009)   COVID-19 Vaccine (3 - 2025-26 season) 05/05/2025 (Originally 03/27/2024)   Cervical Cancer Screening (HPV/Pap Cotest)  05/08/2025 (Originally 12/07/2013)   Mammogram  05/08/2025 (Originally 09/12/2012)   DEXA SCAN  05/08/2025 (Originally 04/18/2024)   Colonoscopy  05/08/2025 (Originally 04/18/2004)   Diabetic kidney evaluation - eGFR measurement  11/07/2024   OPHTHALMOLOGY EXAM  02/16/2025   DTaP/Tdap/Td (3 - Td or Tdap) 06/11/2025   Pneumococcal Vaccine: 50+ Years  Completed   Influenza Vaccine  Completed   HIV Screening  Completed   Hepatitis B Vaccines 19-59 Average Risk  Aged Out   Meningococcal B Vaccine  Aged Out        Problem List Items Addressed This Visit       Active Problems   Essential hypertension   Blood pressure is not at goal for age and co-morbidities.  Reports she ate poorly this weekend and has not taken her medications yet today. Recommendations: continue current meds, take as directed. Monitor BP at home and notify cardiology if consistently >140/90 - BP goal <130/80 - monitor and log blood pressures  at home - check around the same time each day in a relaxed setting - Limit salt to <2000 mg/day - Follow DASH eating plan (heart healthy diet) - limit alcohol to 2 standard drinks per day for men and 1 per day for women - avoid tobacco products - get at least 2 hours of regular aerobic exercise weekly Patient aware of signs/symptoms requiring further/urgent evaluation. Labs updated today.      Chronic kidney disease (CKD) stage G3b/A1, moderately decreased glomerular filtration rate (GFR) between 30-44 mL/min/1.73 square meter and albuminuria creatinine ratio less than 30 mg/g (HCC)   Update labs today. Avoid nephrotoxic agents.       CHF (congestive heart failure) (HCC)   Type 2 diabetes mellitus with complication, with long-term current use of insulin  (HCC)   Relevant Medications   blood glucose meter kit and supplies   Glucose Blood (BLOOD GLUCOSE TEST STRIPS) STRP   Lancet Device MISC   Accu-Chek Softclix Lancets lancets   Other Relevant Orders   CBC with Differential/Platelet   Comprehensive metabolic panel with GFR   Lipid panel   TSH   Other Visit Diagnoses       Annual physical exam    -  Primary   Relevant Orders   CBC with Differential/Platelet   Comprehensive metabolic panel with GFR   Lipid panel   TSH     Encounter for medical examination to establish care         Immunization due       Relevant Orders   Flu vaccine HIGH DOSE PF(Fluzone  Trivalent) (Completed)   Pneumococcal conjugate vaccine 20-valent (Prevnar 20) (Completed)     Impacted cerumen of right ear     Unsuccessful irrigation in clinic. Recommend trying Debrox drops at home before we try again.  PATIENT COUNSELING:    Recommend that most people either abstain from alcohol or drink within safe limits (<=14/week and <=4 drinks/occasion for males, <=7/weeks and <= 3 drinks/occasion for females) and that the risk for alcohol disorders and other health effects rises proportionally with  the number of drinks per week and how often a drinker exceeds daily limits.   Diet: Recommend to adjust caloric intake to maintain or achieve ideal body weight, to reduce intake of dietary saturated fat and total fat, to limit sodium intake by avoiding high sodium foods and not adding table salt, and to maintain adequate dietary potassium and calcium  preferably from fresh fruits, vegetables, and low-fat dairy products.   Emphasized the importance of regular exercise.  Injury prevention: Recommend seatbelts, safety helmets, smoke detector, etc..   Dental health: Recommend regular tooth brushing, flossing, and dental visits.       Return in about 6 months (around 11/06/2024) for chronic disease management.     Lori Mclaughlin Mon, NP

## 2024-05-08 NOTE — Assessment & Plan Note (Signed)
 Blood pressure is not at goal for age and co-morbidities.  Reports she ate poorly this weekend and has not taken her medications yet today. Recommendations: continue current meds, take as directed. Monitor BP at home and notify cardiology if consistently >140/90 - BP goal <130/80 - monitor and log blood pressures at home - check around the same time each day in a relaxed setting - Limit salt to <2000 mg/day - Follow DASH eating plan (heart healthy diet) - limit alcohol to 2 standard drinks per day for men and 1 per day for women - avoid tobacco products - get at least 2 hours of regular aerobic exercise weekly Patient aware of signs/symptoms requiring further/urgent evaluation. Labs updated today.

## 2024-05-09 ENCOUNTER — Telehealth: Payer: Self-pay

## 2024-05-09 ENCOUNTER — Ambulatory Visit: Payer: Self-pay | Admitting: Family Medicine

## 2024-05-09 ENCOUNTER — Ambulatory Visit (INDEPENDENT_AMBULATORY_CARE_PROVIDER_SITE_OTHER)

## 2024-05-09 ENCOUNTER — Other Ambulatory Visit (HOSPITAL_BASED_OUTPATIENT_CLINIC_OR_DEPARTMENT_OTHER): Payer: Self-pay

## 2024-05-09 DIAGNOSIS — E1165 Type 2 diabetes mellitus with hyperglycemia: Secondary | ICD-10-CM | POA: Diagnosis not present

## 2024-05-09 DIAGNOSIS — I10 Essential (primary) hypertension: Secondary | ICD-10-CM | POA: Diagnosis not present

## 2024-05-09 DIAGNOSIS — D649 Anemia, unspecified: Secondary | ICD-10-CM

## 2024-05-09 DIAGNOSIS — E782 Mixed hyperlipidemia: Secondary | ICD-10-CM | POA: Diagnosis not present

## 2024-05-09 DIAGNOSIS — Z794 Long term (current) use of insulin: Secondary | ICD-10-CM | POA: Diagnosis not present

## 2024-05-09 LAB — CBC WITH DIFFERENTIAL/PLATELET
Basophils Absolute: 0.1 K/uL (ref 0.0–0.1)
Basophils Relative: 0.9 % (ref 0.0–3.0)
Eosinophils Absolute: 0.3 K/uL (ref 0.0–0.7)
Eosinophils Relative: 3.4 % (ref 0.0–5.0)
HCT: 29.3 % — ABNORMAL LOW (ref 36.0–46.0)
Hemoglobin: 9.2 g/dL — ABNORMAL LOW (ref 12.0–15.0)
Lymphocytes Relative: 13.6 % (ref 12.0–46.0)
Lymphs Abs: 1.3 K/uL (ref 0.7–4.0)
MCHC: 31.2 g/dL (ref 30.0–36.0)
MCV: 70.6 fl — ABNORMAL LOW (ref 78.0–100.0)
Monocytes Absolute: 0.9 K/uL (ref 0.1–1.0)
Monocytes Relative: 9.2 % (ref 3.0–12.0)
Neutro Abs: 6.8 K/uL (ref 1.4–7.7)
Neutrophils Relative %: 72.9 % (ref 43.0–77.0)
Platelets: 225 K/uL (ref 150.0–400.0)
RBC: 4.16 Mil/uL (ref 3.87–5.11)
RDW: 20.1 % — ABNORMAL HIGH (ref 11.5–15.5)
WBC: 9.3 K/uL (ref 4.0–10.5)

## 2024-05-09 LAB — COMPREHENSIVE METABOLIC PANEL WITH GFR
ALT: 13 U/L (ref 0–35)
AST: 19 U/L (ref 0–37)
Albumin: 4 g/dL (ref 3.5–5.2)
Alkaline Phosphatase: 125 U/L — ABNORMAL HIGH (ref 39–117)
BUN: 54 mg/dL — ABNORMAL HIGH (ref 6–23)
CO2: 23 meq/L (ref 19–32)
Calcium: 9.1 mg/dL (ref 8.4–10.5)
Chloride: 107 meq/L (ref 96–112)
Creatinine, Ser: 3.25 mg/dL — ABNORMAL HIGH (ref 0.40–1.20)
GFR: 14.42 mL/min — CL (ref >60.00)
Glucose, Bld: 95 mg/dL (ref 70–99)
Potassium: 4.2 meq/L (ref 3.5–5.1)
Sodium: 142 meq/L (ref 135–145)
Total Bilirubin: 0.8 mg/dL (ref 0.2–1.2)
Total Protein: 7.7 g/dL (ref 6.0–8.3)

## 2024-05-09 LAB — LIPID PANEL
Cholesterol: 110 mg/dL (ref 0–200)
HDL: 51.5 mg/dL (ref >39.00)
LDL Cholesterol: 49 mg/dL (ref 0–99)
NonHDL: 58.4
Total CHOL/HDL Ratio: 2
Triglycerides: 49 mg/dL (ref 0.0–149.0)
VLDL: 9.8 mg/dL (ref 0.0–40.0)

## 2024-05-09 LAB — IBC + FERRITIN
Ferritin: 54.8 ng/mL (ref 10.0–291.0)
Iron: 31 ug/dL — ABNORMAL LOW (ref 42–145)
Saturation Ratios: 8.6 % — ABNORMAL LOW (ref 20.0–50.0)
TIBC: 358.4 ug/dL (ref 250.0–450.0)
Transferrin: 256 mg/dL (ref 212.0–360.0)

## 2024-05-09 LAB — TSH: TSH: 2.03 u[IU]/mL (ref 0.35–5.50)

## 2024-05-09 MED ORDER — INSULIN ASPART FLEXPEN 100 UNIT/ML ~~LOC~~ SOPN
50.0000 [IU] | PEN_INJECTOR | Freq: Every day | SUBCUTANEOUS | 3 refills | Status: DC
  Filled 2024-05-09 – 2024-05-12 (×2): qty 15, 30d supply, fill #0
  Filled 2024-05-26 – 2024-06-07 (×2): qty 15, 30d supply, fill #1

## 2024-05-09 MED ORDER — LANTUS SOLOSTAR 100 UNIT/ML ~~LOC~~ SOPN
35.0000 [IU] | PEN_INJECTOR | Freq: Every morning | SUBCUTANEOUS | 1 refills | Status: DC
  Filled 2024-05-09 – 2024-07-17 (×2): qty 45, 128d supply, fill #0

## 2024-05-09 NOTE — Telephone Encounter (Signed)
 CRITICAL VALUE STICKER  CRITICAL VALUE: GFR 14.42  RECEIVER (on-site recipient of call): Melyssa Signor  DATE & TIME NOTIFIED:  05/09/24 10:56am  MESSENGER (representative from lab): [email or web address]  MD NOTIFIED: Waddell Mon  TIME OF NOTIFICATION: 05/09/24 10:57am  RESPONSE:

## 2024-05-09 NOTE — Telephone Encounter (Signed)
 Labs sent to [facility], will contact patient about labs and document in result note.

## 2024-05-09 NOTE — Telephone Encounter (Signed)
 History CKD, stable compared to recent readings. After results post please forward to [facility]. Patient needs to schedule a follow-up with them also (not seen since January 2024).

## 2024-05-10 ENCOUNTER — Ambulatory Visit: Payer: Self-pay | Admitting: Family Medicine

## 2024-05-10 ENCOUNTER — Other Ambulatory Visit (HOSPITAL_BASED_OUTPATIENT_CLINIC_OR_DEPARTMENT_OTHER): Payer: Self-pay

## 2024-05-10 MED ORDER — TRULICITY 1.5 MG/0.5ML ~~LOC~~ SOAJ
1.5000 mg | SUBCUTANEOUS | 2 refills | Status: DC
  Filled 2024-05-10 – 2024-05-12 (×2): qty 2, 28d supply, fill #0
  Filled 2024-06-07: qty 2, 28d supply, fill #1
  Filled 2024-07-17: qty 2, 28d supply, fill #2

## 2024-05-11 ENCOUNTER — Other Ambulatory Visit (HOSPITAL_BASED_OUTPATIENT_CLINIC_OR_DEPARTMENT_OTHER): Payer: Self-pay

## 2024-05-12 ENCOUNTER — Other Ambulatory Visit (HOSPITAL_BASED_OUTPATIENT_CLINIC_OR_DEPARTMENT_OTHER): Payer: Self-pay

## 2024-05-15 DIAGNOSIS — H3582 Retinal ischemia: Secondary | ICD-10-CM | POA: Diagnosis not present

## 2024-05-15 DIAGNOSIS — E113513 Type 2 diabetes mellitus with proliferative diabetic retinopathy with macular edema, bilateral: Secondary | ICD-10-CM | POA: Diagnosis not present

## 2024-05-15 DIAGNOSIS — H35033 Hypertensive retinopathy, bilateral: Secondary | ICD-10-CM | POA: Diagnosis not present

## 2024-05-15 DIAGNOSIS — H31093 Other chorioretinal scars, bilateral: Secondary | ICD-10-CM | POA: Diagnosis not present

## 2024-05-15 LAB — HM DIABETES EYE EXAM

## 2024-05-16 ENCOUNTER — Other Ambulatory Visit (HOSPITAL_BASED_OUTPATIENT_CLINIC_OR_DEPARTMENT_OTHER): Payer: Self-pay

## 2024-05-19 ENCOUNTER — Other Ambulatory Visit (HOSPITAL_BASED_OUTPATIENT_CLINIC_OR_DEPARTMENT_OTHER): Payer: Self-pay

## 2024-05-19 ENCOUNTER — Other Ambulatory Visit: Payer: Self-pay

## 2024-05-19 DIAGNOSIS — E113513 Type 2 diabetes mellitus with proliferative diabetic retinopathy with macular edema, bilateral: Secondary | ICD-10-CM | POA: Diagnosis not present

## 2024-05-19 DIAGNOSIS — H44001 Unspecified purulent endophthalmitis, right eye: Secondary | ICD-10-CM | POA: Diagnosis not present

## 2024-05-19 MED ORDER — PREDNISOLONE ACETATE 1 % OP SUSP
1.0000 [drp] | OPHTHALMIC | 0 refills | Status: DC | PRN
  Filled 2024-05-19: qty 10, 9d supply, fill #0

## 2024-05-19 MED ORDER — MOXIFLOXACIN HCL 0.5 % OP SOLN
1.0000 [drp] | OPHTHALMIC | 0 refills | Status: DC
  Filled 2024-05-19: qty 3, 5d supply, fill #0

## 2024-05-20 DIAGNOSIS — H44001 Unspecified purulent endophthalmitis, right eye: Secondary | ICD-10-CM | POA: Diagnosis not present

## 2024-05-22 DIAGNOSIS — Z794 Long term (current) use of insulin: Secondary | ICD-10-CM | POA: Diagnosis not present

## 2024-05-22 DIAGNOSIS — H44001 Unspecified purulent endophthalmitis, right eye: Secondary | ICD-10-CM | POA: Diagnosis not present

## 2024-05-22 DIAGNOSIS — I509 Heart failure, unspecified: Secondary | ICD-10-CM | POA: Diagnosis not present

## 2024-05-22 DIAGNOSIS — Z87891 Personal history of nicotine dependence: Secondary | ICD-10-CM | POA: Diagnosis not present

## 2024-05-22 DIAGNOSIS — E113591 Type 2 diabetes mellitus with proliferative diabetic retinopathy without macular edema, right eye: Secondary | ICD-10-CM | POA: Diagnosis not present

## 2024-05-22 DIAGNOSIS — E1122 Type 2 diabetes mellitus with diabetic chronic kidney disease: Secondary | ICD-10-CM | POA: Diagnosis not present

## 2024-05-22 DIAGNOSIS — H20051 Hypopyon, right eye: Secondary | ICD-10-CM | POA: Diagnosis not present

## 2024-05-22 DIAGNOSIS — I130 Hypertensive heart and chronic kidney disease with heart failure and stage 1 through stage 4 chronic kidney disease, or unspecified chronic kidney disease: Secondary | ICD-10-CM | POA: Diagnosis not present

## 2024-05-22 DIAGNOSIS — N189 Chronic kidney disease, unspecified: Secondary | ICD-10-CM | POA: Diagnosis not present

## 2024-05-23 ENCOUNTER — Other Ambulatory Visit: Payer: Self-pay

## 2024-05-23 ENCOUNTER — Other Ambulatory Visit (HOSPITAL_BASED_OUTPATIENT_CLINIC_OR_DEPARTMENT_OTHER): Payer: Self-pay

## 2024-05-25 ENCOUNTER — Other Ambulatory Visit (HOSPITAL_BASED_OUTPATIENT_CLINIC_OR_DEPARTMENT_OTHER): Payer: Self-pay

## 2024-05-26 ENCOUNTER — Other Ambulatory Visit (HOSPITAL_BASED_OUTPATIENT_CLINIC_OR_DEPARTMENT_OTHER): Payer: Self-pay

## 2024-05-26 ENCOUNTER — Other Ambulatory Visit: Payer: Self-pay

## 2024-05-26 ENCOUNTER — Other Ambulatory Visit (HOSPITAL_COMMUNITY): Payer: Self-pay

## 2024-05-26 ENCOUNTER — Other Ambulatory Visit: Payer: Self-pay | Admitting: Family Medicine

## 2024-05-26 DIAGNOSIS — E118 Type 2 diabetes mellitus with unspecified complications: Secondary | ICD-10-CM

## 2024-05-26 MED ORDER — BLOOD GLUCOSE METER KIT
1.0000 | PACK | Freq: Three times a day (TID) | 0 refills | Status: DC
  Filled 2024-05-26: qty 1, 1d supply, fill #0

## 2024-05-26 MED ORDER — BLOOD GLUCOSE TEST VI STRP
1.0000 | ORAL_STRIP | Freq: Three times a day (TID) | 0 refills | Status: AC
  Filled 2024-05-26: qty 100, 34d supply, fill #0

## 2024-05-26 MED ORDER — DEXCOM G7 SENSOR MISC
1 refills | Status: DC
  Filled 2024-05-26: qty 3, 30d supply, fill #0
  Filled 2024-06-30 – 2024-07-17 (×2): qty 3, 30d supply, fill #1

## 2024-05-26 MED ORDER — ACCU-CHEK SOFTCLIX LANCETS MISC
1.0000 | Freq: Three times a day (TID) | 0 refills | Status: AC
  Filled 2024-05-26: qty 100, 34d supply, fill #0

## 2024-06-07 ENCOUNTER — Other Ambulatory Visit (HOSPITAL_BASED_OUTPATIENT_CLINIC_OR_DEPARTMENT_OTHER): Payer: Self-pay

## 2024-06-07 MED ORDER — INSULIN LISPRO (1 UNIT DIAL) 100 UNIT/ML (KWIKPEN)
PEN_INJECTOR | SUBCUTANEOUS | 0 refills | Status: DC
  Filled 2024-06-07: qty 45, 90d supply, fill #0

## 2024-06-08 ENCOUNTER — Other Ambulatory Visit (HOSPITAL_BASED_OUTPATIENT_CLINIC_OR_DEPARTMENT_OTHER): Payer: Self-pay

## 2024-06-12 ENCOUNTER — Other Ambulatory Visit (HOSPITAL_BASED_OUTPATIENT_CLINIC_OR_DEPARTMENT_OTHER): Payer: Self-pay

## 2024-06-13 ENCOUNTER — Other Ambulatory Visit (HOSPITAL_COMMUNITY): Payer: Self-pay

## 2024-07-05 ENCOUNTER — Other Ambulatory Visit (HOSPITAL_BASED_OUTPATIENT_CLINIC_OR_DEPARTMENT_OTHER): Payer: Self-pay

## 2024-07-10 ENCOUNTER — Other Ambulatory Visit (HOSPITAL_BASED_OUTPATIENT_CLINIC_OR_DEPARTMENT_OTHER): Payer: Self-pay

## 2024-07-18 ENCOUNTER — Other Ambulatory Visit (HOSPITAL_BASED_OUTPATIENT_CLINIC_OR_DEPARTMENT_OTHER): Payer: Self-pay

## 2024-07-18 ENCOUNTER — Other Ambulatory Visit: Payer: Self-pay

## 2024-07-19 ENCOUNTER — Emergency Department (HOSPITAL_COMMUNITY)

## 2024-07-19 ENCOUNTER — Inpatient Hospital Stay (HOSPITAL_COMMUNITY)
Admission: EM | Admit: 2024-07-19 | Discharge: 2024-07-27 | Disposition: E | Attending: Pulmonary Disease | Admitting: Pulmonary Disease

## 2024-07-19 ENCOUNTER — Encounter (HOSPITAL_COMMUNITY): Payer: Self-pay

## 2024-07-19 DIAGNOSIS — I50810 Right heart failure, unspecified: Secondary | ICD-10-CM | POA: Insufficient documentation

## 2024-07-19 DIAGNOSIS — N184 Chronic kidney disease, stage 4 (severe): Secondary | ICD-10-CM | POA: Diagnosis present

## 2024-07-19 DIAGNOSIS — I2721 Secondary pulmonary arterial hypertension: Secondary | ICD-10-CM | POA: Insufficient documentation

## 2024-07-19 DIAGNOSIS — I959 Hypotension, unspecified: Secondary | ICD-10-CM | POA: Diagnosis present

## 2024-07-19 DIAGNOSIS — Z6841 Body Mass Index (BMI) 40.0 and over, adult: Secondary | ICD-10-CM

## 2024-07-19 DIAGNOSIS — F32A Depression, unspecified: Secondary | ICD-10-CM | POA: Diagnosis present

## 2024-07-19 DIAGNOSIS — Z66 Do not resuscitate: Secondary | ICD-10-CM | POA: Diagnosis not present

## 2024-07-19 DIAGNOSIS — N1832 Chronic kidney disease, stage 3b: Secondary | ICD-10-CM | POA: Diagnosis not present

## 2024-07-19 DIAGNOSIS — Z888 Allergy status to other drugs, medicaments and biological substances status: Secondary | ICD-10-CM

## 2024-07-19 DIAGNOSIS — I509 Heart failure, unspecified: Secondary | ICD-10-CM | POA: Diagnosis not present

## 2024-07-19 DIAGNOSIS — K219 Gastro-esophageal reflux disease without esophagitis: Secondary | ICD-10-CM | POA: Diagnosis present

## 2024-07-19 DIAGNOSIS — Z7985 Long-term (current) use of injectable non-insulin antidiabetic drugs: Secondary | ICD-10-CM

## 2024-07-19 DIAGNOSIS — Z1152 Encounter for screening for COVID-19: Secondary | ICD-10-CM | POA: Diagnosis not present

## 2024-07-19 DIAGNOSIS — R001 Bradycardia, unspecified: Principal | ICD-10-CM | POA: Diagnosis present

## 2024-07-19 DIAGNOSIS — Z8249 Family history of ischemic heart disease and other diseases of the circulatory system: Secondary | ICD-10-CM

## 2024-07-19 DIAGNOSIS — I10 Essential (primary) hypertension: Secondary | ICD-10-CM | POA: Diagnosis not present

## 2024-07-19 DIAGNOSIS — D631 Anemia in chronic kidney disease: Secondary | ICD-10-CM | POA: Diagnosis present

## 2024-07-19 DIAGNOSIS — Z79899 Other long term (current) drug therapy: Secondary | ICD-10-CM

## 2024-07-19 DIAGNOSIS — J309 Allergic rhinitis, unspecified: Secondary | ICD-10-CM | POA: Diagnosis present

## 2024-07-19 DIAGNOSIS — E872 Acidosis, unspecified: Secondary | ICD-10-CM | POA: Diagnosis present

## 2024-07-19 DIAGNOSIS — N179 Acute kidney failure, unspecified: Secondary | ICD-10-CM | POA: Diagnosis present

## 2024-07-19 DIAGNOSIS — D696 Thrombocytopenia, unspecified: Secondary | ICD-10-CM | POA: Diagnosis present

## 2024-07-19 DIAGNOSIS — Z794 Long term (current) use of insulin: Secondary | ICD-10-CM | POA: Diagnosis not present

## 2024-07-19 DIAGNOSIS — J189 Pneumonia, unspecified organism: Secondary | ICD-10-CM

## 2024-07-19 DIAGNOSIS — G9341 Metabolic encephalopathy: Secondary | ICD-10-CM | POA: Diagnosis not present

## 2024-07-19 DIAGNOSIS — I130 Hypertensive heart and chronic kidney disease with heart failure and stage 1 through stage 4 chronic kidney disease, or unspecified chronic kidney disease: Secondary | ICD-10-CM | POA: Diagnosis present

## 2024-07-19 DIAGNOSIS — I5082 Biventricular heart failure: Secondary | ICD-10-CM | POA: Diagnosis present

## 2024-07-19 DIAGNOSIS — E1122 Type 2 diabetes mellitus with diabetic chronic kidney disease: Secondary | ICD-10-CM | POA: Diagnosis present

## 2024-07-19 DIAGNOSIS — Z515 Encounter for palliative care: Secondary | ICD-10-CM | POA: Diagnosis not present

## 2024-07-19 DIAGNOSIS — Z833 Family history of diabetes mellitus: Secondary | ICD-10-CM

## 2024-07-19 DIAGNOSIS — Z91040 Latex allergy status: Secondary | ICD-10-CM

## 2024-07-19 DIAGNOSIS — R34 Anuria and oliguria: Secondary | ICD-10-CM | POA: Diagnosis not present

## 2024-07-19 DIAGNOSIS — I5022 Chronic systolic (congestive) heart failure: Secondary | ICD-10-CM | POA: Diagnosis not present

## 2024-07-19 DIAGNOSIS — I5043 Acute on chronic combined systolic (congestive) and diastolic (congestive) heart failure: Secondary | ICD-10-CM | POA: Diagnosis present

## 2024-07-19 DIAGNOSIS — R4182 Altered mental status, unspecified: Secondary | ICD-10-CM | POA: Diagnosis not present

## 2024-07-19 DIAGNOSIS — E785 Hyperlipidemia, unspecified: Secondary | ICD-10-CM | POA: Diagnosis not present

## 2024-07-19 DIAGNOSIS — J9601 Acute respiratory failure with hypoxia: Principal | ICD-10-CM | POA: Diagnosis present

## 2024-07-19 DIAGNOSIS — J690 Pneumonitis due to inhalation of food and vomit: Secondary | ICD-10-CM | POA: Insufficient documentation

## 2024-07-19 DIAGNOSIS — G4733 Obstructive sleep apnea (adult) (pediatric): Secondary | ICD-10-CM | POA: Diagnosis not present

## 2024-07-19 DIAGNOSIS — Z1159 Encounter for screening for other viral diseases: Secondary | ICD-10-CM

## 2024-07-19 DIAGNOSIS — I2722 Pulmonary hypertension due to left heart disease: Secondary | ICD-10-CM | POA: Diagnosis present

## 2024-07-19 DIAGNOSIS — I5040 Unspecified combined systolic (congestive) and diastolic (congestive) heart failure: Secondary | ICD-10-CM | POA: Insufficient documentation

## 2024-07-19 DIAGNOSIS — Z87891 Personal history of nicotine dependence: Secondary | ICD-10-CM

## 2024-07-19 LAB — I-STAT CG4 LACTIC ACID, ED: Lactic Acid, Venous: 5.1 mmol/L (ref 0.5–1.9)

## 2024-07-19 LAB — I-STAT VENOUS BLOOD GAS, ED
Acid-base deficit: 8 mmol/L — ABNORMAL HIGH (ref 0.0–2.0)
Acid-base deficit: 6 mmol/L — ABNORMAL HIGH (ref 0.0–2.0)
Bicarbonate: 18.3 mmol/L — ABNORMAL LOW (ref 20.0–28.0)
Bicarbonate: 20.8 mmol/L (ref 20.0–28.0)
Calcium, Ion: 1.03 mmol/L — ABNORMAL LOW (ref 1.15–1.40)
Calcium, Ion: 1.15 mmol/L (ref 1.15–1.40)
HCT: 35 % — ABNORMAL LOW (ref 36.0–46.0)
HCT: 29 % — ABNORMAL LOW (ref 36.0–46.0)
Hemoglobin: 11.9 g/dL — ABNORMAL LOW (ref 12.0–15.0)
Hemoglobin: 9.9 g/dL — ABNORMAL LOW (ref 12.0–15.0)
O2 Saturation: 95 %
O2 Saturation: 87 %
Patient temperature: 95.3
Potassium: 4.2 mmol/L (ref 3.5–5.1)
Potassium: 3.8 mmol/L (ref 3.5–5.1)
Sodium: 143 mmol/L (ref 135–145)
Sodium: 143 mmol/L (ref 135–145)
TCO2: 20 mmol/L — ABNORMAL LOW (ref 22–32)
TCO2: 22 mmol/L (ref 22–32)
pCO2, Ven: 41 mmHg — ABNORMAL LOW (ref 44–60)
pCO2, Ven: 43.4 mmHg — ABNORMAL LOW (ref 44–60)
pH, Ven: 7.259 (ref 7.25–7.43)
pH, Ven: 7.279 (ref 7.25–7.43)
pO2, Ven: 85 mmHg — ABNORMAL HIGH (ref 32–45)
pO2, Ven: 55 mmHg — ABNORMAL HIGH (ref 32–45)

## 2024-07-19 LAB — I-STAT CHEM 8, ED
BUN: 56 mg/dL — ABNORMAL HIGH (ref 8–23)
Calcium, Ion: 1.02 mmol/L — ABNORMAL LOW (ref 1.15–1.40)
Chloride: 111 mmol/L (ref 98–111)
Creatinine, Ser: 3.1 mg/dL — ABNORMAL HIGH (ref 0.44–1.00)
Glucose, Bld: 245 mg/dL — ABNORMAL HIGH (ref 70–99)
HCT: 35 % — ABNORMAL LOW (ref 36.0–46.0)
Hemoglobin: 11.9 g/dL — ABNORMAL LOW (ref 12.0–15.0)
Potassium: 4.2 mmol/L (ref 3.5–5.1)
Sodium: 143 mmol/L (ref 135–145)
TCO2: 18 mmol/L — ABNORMAL LOW (ref 22–32)

## 2024-07-19 LAB — CBC
HCT: 26.3 % — ABNORMAL LOW (ref 36.0–46.0)
Hemoglobin: 8.1 g/dL — ABNORMAL LOW (ref 12.0–15.0)
MCH: 22.3 pg — ABNORMAL LOW (ref 26.0–34.0)
MCHC: 30.8 g/dL (ref 30.0–36.0)
MCV: 72.3 fL — ABNORMAL LOW (ref 80.0–100.0)
Platelets: 142 K/uL — ABNORMAL LOW (ref 150–400)
RBC: 3.64 MIL/uL — ABNORMAL LOW (ref 3.87–5.11)
RDW: 21.7 % — ABNORMAL HIGH (ref 11.5–15.5)
WBC: 7.1 K/uL (ref 4.0–10.5)
nRBC: 0 % (ref 0.0–0.2)

## 2024-07-19 LAB — T4, FREE: Free T4: 1.41 ng/dL (ref 0.80–2.00)

## 2024-07-19 LAB — RESP PANEL BY RT-PCR (RSV, FLU A&B, COVID)  RVPGX2
Influenza A by PCR: NEGATIVE
Influenza B by PCR: NEGATIVE
Resp Syncytial Virus by PCR: NEGATIVE
SARS Coronavirus 2 by RT PCR: NEGATIVE

## 2024-07-19 LAB — COMPREHENSIVE METABOLIC PANEL WITH GFR
ALT: 28 U/L (ref 0–44)
AST: 59 U/L — ABNORMAL HIGH (ref 15–41)
Albumin: 3.8 g/dL (ref 3.5–5.0)
Alkaline Phosphatase: 246 U/L — ABNORMAL HIGH (ref 38–126)
Anion gap: 20 — ABNORMAL HIGH (ref 5–15)
BUN: 45 mg/dL — ABNORMAL HIGH (ref 8–23)
CO2: 16 mmol/L — ABNORMAL LOW (ref 22–32)
Calcium: 8.9 mg/dL (ref 8.9–10.3)
Chloride: 106 mmol/L (ref 98–111)
Creatinine, Ser: 3.07 mg/dL — ABNORMAL HIGH (ref 0.44–1.00)
GFR, Estimated: 16 mL/min — ABNORMAL LOW
Glucose, Bld: 251 mg/dL — ABNORMAL HIGH (ref 70–99)
Potassium: 4.2 mmol/L (ref 3.5–5.1)
Sodium: 141 mmol/L (ref 135–145)
Total Bilirubin: 1.1 mg/dL (ref 0.0–1.2)
Total Protein: 7.8 g/dL (ref 6.5–8.1)

## 2024-07-19 LAB — PROTIME-INR
INR: 1.3 — ABNORMAL HIGH (ref 0.8–1.2)
Prothrombin Time: 17 s — ABNORMAL HIGH (ref 11.4–15.2)

## 2024-07-19 LAB — URINALYSIS, W/ REFLEX TO CULTURE (INFECTION SUSPECTED)
Bilirubin Urine: NEGATIVE
Glucose, UA: 50 mg/dL — AB
Hgb urine dipstick: NEGATIVE
Ketones, ur: NEGATIVE mg/dL
Leukocytes,Ua: NEGATIVE
Nitrite: NEGATIVE
Protein, ur: >=300 mg/dL — AB
Specific Gravity, Urine: 1.012 (ref 1.005–1.030)
pH: 5 (ref 5.0–8.0)

## 2024-07-19 LAB — CBC WITH DIFFERENTIAL/PLATELET
Abs Immature Granulocytes: 0.05 K/uL (ref 0.00–0.07)
Basophils Absolute: 0.1 K/uL (ref 0.0–0.1)
Basophils Relative: 1 %
Eosinophils Absolute: 0.1 K/uL (ref 0.0–0.5)
Eosinophils Relative: 1 %
HCT: 32.4 % — ABNORMAL LOW (ref 36.0–46.0)
Hemoglobin: 9.8 g/dL — ABNORMAL LOW (ref 12.0–15.0)
Immature Granulocytes: 1 %
Lymphocytes Relative: 14 %
Lymphs Abs: 1.1 K/uL (ref 0.7–4.0)
MCH: 22.4 pg — ABNORMAL LOW (ref 26.0–34.0)
MCHC: 30.2 g/dL (ref 30.0–36.0)
MCV: 74 fL — ABNORMAL LOW (ref 80.0–100.0)
Monocytes Absolute: 0.3 K/uL (ref 0.1–1.0)
Monocytes Relative: 4 %
Neutro Abs: 6.2 K/uL (ref 1.7–7.7)
Neutrophils Relative %: 79 %
Platelets: 196 K/uL (ref 150–400)
RBC: 4.38 MIL/uL (ref 3.87–5.11)
RDW: 22.6 % — ABNORMAL HIGH (ref 11.5–15.5)
WBC: 7.8 K/uL (ref 4.0–10.5)
nRBC: 0 % (ref 0.0–0.2)

## 2024-07-19 LAB — GLUCOSE, CAPILLARY
Glucose-Capillary: 198 mg/dL — ABNORMAL HIGH (ref 70–99)
Glucose-Capillary: 184 mg/dL — ABNORMAL HIGH (ref 70–99)

## 2024-07-19 LAB — HEMOGLOBIN A1C
Hgb A1c MFr Bld: 7.2 % — ABNORMAL HIGH (ref 4.8–5.6)
Mean Plasma Glucose: 159.94 mg/dL

## 2024-07-19 LAB — TSH: TSH: 2 u[IU]/mL (ref 0.350–4.500)

## 2024-07-19 LAB — CREATININE, SERUM
Creatinine, Ser: 2.95 mg/dL — ABNORMAL HIGH (ref 0.44–1.00)
GFR, Estimated: 17 mL/min — ABNORMAL LOW

## 2024-07-19 LAB — MRSA NEXT GEN BY PCR, NASAL: MRSA by PCR Next Gen: NOT DETECTED

## 2024-07-19 LAB — PRO BRAIN NATRIURETIC PEPTIDE: Pro Brain Natriuretic Peptide: 23439 pg/mL — ABNORMAL HIGH

## 2024-07-19 MED ORDER — ONDANSETRON HCL 4 MG/2ML IJ SOLN
4.0000 mg | Freq: Four times a day (QID) | INTRAMUSCULAR | Status: DC | PRN
  Administered 2024-07-20: 4 mg via INTRAVENOUS
  Filled 2024-07-19: qty 2

## 2024-07-19 MED ORDER — FENTANYL CITRATE (PF) 50 MCG/ML IJ SOSY: 25.0000 ug | PREFILLED_SYRINGE | Freq: Once | INTRAMUSCULAR | Status: DC

## 2024-07-19 MED ORDER — ORAL CARE MOUTH RINSE: 15.0000 mL | OROMUCOSAL | Status: DC | PRN

## 2024-07-19 MED ORDER — SODIUM CHLORIDE 0.9 % IV SOLN
1.0000 g | INTRAVENOUS | Status: DC
  Administered 2024-07-19: 1 g via INTRAVENOUS
  Filled 2024-07-19: qty 10

## 2024-07-19 MED ORDER — SODIUM CHLORIDE 0.9 % IV SOLN
500.0000 mg | INTRAVENOUS | Status: DC
  Administered 2024-07-19 – 2024-07-21 (×3): 500 mg via INTRAVENOUS
  Filled 2024-07-19 (×3): qty 5

## 2024-07-19 MED ORDER — ORAL CARE MOUTH RINSE
15.0000 mL | OROMUCOSAL | Status: DC
  Administered 2024-07-19 – 2024-07-20 (×10): 15 mL via OROMUCOSAL

## 2024-07-19 MED ORDER — INSULIN ASPART 100 UNIT/ML IJ SOLN
0.0000 [IU] | INTRAMUSCULAR | Status: DC
  Administered 2024-07-19 – 2024-07-20 (×4): 2 [IU] via SUBCUTANEOUS
  Filled 2024-07-19 (×4): qty 2

## 2024-07-19 MED ORDER — FAMOTIDINE 20 MG PO TABS
20.0000 mg | ORAL_TABLET | Freq: Two times a day (BID) | ORAL | Status: DC
  Administered 2024-07-19: 20 mg
  Filled 2024-07-19 (×2): qty 1

## 2024-07-19 MED ORDER — CHLORHEXIDINE GLUCONATE CLOTH 2 % EX PADS
6.0000 | MEDICATED_PAD | Freq: Every day | CUTANEOUS | Status: DC
  Administered 2024-07-19 – 2024-07-21 (×2): 6 via TOPICAL

## 2024-07-19 MED ORDER — PANTOPRAZOLE SODIUM 40 MG IV SOLR
40.0000 mg | Freq: Every day | INTRAVENOUS | Status: DC
  Administered 2024-07-19: 40 mg via INTRAVENOUS
  Filled 2024-07-19: qty 10

## 2024-07-19 MED ORDER — FENTANYL BOLUS VIA INFUSION
25.0000 ug | INTRAVENOUS | Status: DC | PRN
  Administered 2024-07-19: 50 ug via INTRAVENOUS

## 2024-07-19 MED ORDER — PROPOFOL 1000 MG/100ML IV EMUL
0.0000 ug/kg/min | INTRAVENOUS | Status: DC
  Administered 2024-07-19: 30 ug/kg/min via INTRAVENOUS
  Administered 2024-07-20: 10 ug/kg/min via INTRAVENOUS
  Filled 2024-07-19 (×2): qty 100

## 2024-07-19 MED ORDER — IOHEXOL 350 MG/ML SOLN
100.0000 mL | Freq: Once | INTRAVENOUS | Status: AC | PRN
  Administered 2024-07-19: 100 mL via INTRAVENOUS

## 2024-07-19 MED ORDER — HEPARIN SODIUM (PORCINE) 5000 UNIT/ML IJ SOLN
5000.0000 [IU] | Freq: Three times a day (TID) | INTRAMUSCULAR | Status: DC
  Administered 2024-07-19 – 2024-07-21 (×7): 5000 [IU] via SUBCUTANEOUS
  Filled 2024-07-19 (×7): qty 1

## 2024-07-19 MED ORDER — PROPOFOL 1000 MG/100ML IV EMUL
INTRAVENOUS | Status: AC
  Administered 2024-07-19: 10 ug/kg/min via INTRAVENOUS
  Filled 2024-07-19: qty 100

## 2024-07-19 MED ORDER — FENTANYL 2500MCG IN NS 250ML (10MCG/ML) PREMIX INFUSION
0.0000 ug/h | INTRAVENOUS | Status: DC
  Administered 2024-07-20: 75 ug/h via INTRAVENOUS
  Filled 2024-07-19: qty 250

## 2024-07-19 MED ORDER — FENTANYL 2500MCG IN NS 250ML (10MCG/ML) PREMIX INFUSION
INTRAVENOUS | Status: AC
  Administered 2024-07-19: 50 ug/h via INTRAVENOUS
  Filled 2024-07-19: qty 250

## 2024-07-19 NOTE — H&P (Signed)
 "  NAME:  Lori Mclaughlin, MRN:  [id number], DOB:  05-26-1961, LOS: 0 ADMISSION DATE:  07/19/2024, CONSULTATION DATE:  07/19/2024 REFERRING MD:  Ozell Marine, DO, CHIEF COMPLAINT:  SOB  History of Present Illness:  63 y/o female with PMH for DMT2 on insulin , CKD,  HTN, Severe OSA, Morbid Obesity and HLD who presented from home after collapsing at the top of the stairs.  Apparently she was being pushed up the stairs by the family and became SOB.  She apparently collapesd and became unresponsive.  EMS arrived to find her hypertensive and bradycardiac as low as HR 20s.  She was intubated with 5mg  Versed .  In the ED she was following commands neurologically.  Labs show LA 5.1, Cr 3, BNP 23k,  WBC 7.4.  CT chest showing posterior consolidations, pneumonia vs dependent atelectasis.  Pertinent  Medical History  DMT2 on insulin , HTN, Severe OSA, Morbid Obesity and HLD  Significant Hospital Events: Including procedures, antibiotic start and stop dates in addition to other pertinent events   12/24: admit to ICU  Interim History / Subjective:  N/a  Objective    Blood pressure (!) 148/59, pulse 72, temperature (!) 96.7 F (35.9 C), temperature source Temporal, resp. rate (!) 30, height 5' (1.524 m), last menstrual period 02/23/2012, SpO2 100%.    Vent Mode: PRVC FiO2 (%):  [100 %] 100 % Set Rate:  [18 bmp] 18 bmp Vt Set:  [360 mL] 360 mL PEEP:  [5 cmH20] 5 cmH20 Plateau Pressure:  [16 cmH20] 16 cmH20   Intake/Output Summary (Last 24 hours) at 07/19/2024 1950 Last data filed at 07/19/2024 1917 Gross per 24 hour  Intake 2.05 ml  Output --  Net 2.05 ml   There were no vitals filed for this visit.  Examination: General: On Vent and sedated, but able to follow simple commands, NAD HENT: PERRLA no icterus, neck supple Lungs: CTA no wheezes or rales Cardiovascular: reg, s1s2 no murmurs or rubs Abdomen: soft obese nt nd bs pos Extremities: 2+ pitting LE b/l, no cyanosis, clubbing or  edema Neuro: follows commands, moving all extremities GU: n/a  Resolved problem list   Assessment and Plan  Acute hypoxic respiratory failure Possibly from Pulm HTN and decreased pre-load Pneumonia contribution Follow ABGs/VBGs/O2sats SAT/SBT when medically appropriate Pneumonia-CAP? Broad spectrum antibiotics Sputum c/s CHF 2D echo Right heart pressures Is/Os Even to negative fluids balance Increased BNP reflective of serum Cr as well as CHF HTN She was hypertensive this whole time BP management Check thyroid  function CKD Stage 3B Seems stable Monitor Is/Os Serum Cr OSA Currently intubated, not sure if she uses a CPAP Will address once extubated Likely Pulmonary HTN Will get 2 D echo Morbid Obesity Supportive care and dietary modification   Labs   CBC: Recent Labs  Lab 07/19/24 1852 07/19/24 1904  WBC 7.8  --   NEUTROABS 6.2  --   HGB 9.8* 11.9*  11.9*  HCT 32.4* 35.0*  35.0*  MCV 74.0*  --   PLT 196  --     Basic Metabolic Panel: Recent Labs  Lab 07/19/24 1852 07/19/24 1904  NA 141 143  143  K 4.2 4.2  4.2  CL 106 111  CO2 16*  --   GLUCOSE 251* 245*  BUN 45* 56*  CREATININE 3.07* 3.10*  CALCIUM  8.9  --    GFR: CrCl cannot be calculated (Unknown ideal weight.). Recent Labs  Lab 07/19/24 1852 07/19/24 1905  WBC 7.8  --   LATICACIDVEN  --  5.1*    Liver Function Tests: Recent Labs  Lab 07/19/24 1852  AST 59*  ALT 28  ALKPHOS 246*  BILITOT 1.1  PROT 7.8  ALBUMIN 3.8   No results for input(s): LIPASE, AMYLASE in the last 168 hours. No results for input(s): AMMONIA in the last 168 hours.  ABG    Component Value Date/Time   HCO3 18.3 (L) 07/19/2024 1904   TCO2 18 (L) 07/19/2024 1904   TCO2 20 (L) 07/19/2024 1904   ACIDBASEDEF 8.0 (H) 07/19/2024 1904   O2SAT 95 07/19/2024 1904     Coagulation Profile: Recent Labs  Lab 07/19/24 1852  INR 1.3*    Cardiac Enzymes: No results for input(s): CKTOTAL, CKMB,  CKMBINDEX, TROPONINI in the last 168 hours.  HbA1C: HbA1c POC (<> result, manual entry)  Date/Time Value Ref Range Status  12/10/2022 10:29 AM >15.0 4.0 - 5.6 % Final   Hgb A1c MFr Bld  Date/Time Value Ref Range Status  09/27/2023 09:22 PM 7.5 (H) 4.8 - 5.6 % Final    Comment:    (NOTE) Pre diabetes:          5.7%-6.4%  Diabetes:              >6.4%  Glycemic control for   <7.0% adults with diabetes   03/18/2022 11:24 AM >14.0 (H) <5.7 % of total Hgb Final    Comment:    Verified by repeat analysis. . For someone without known diabetes, a hemoglobin A1c value of 6.5% or greater indicates that they may have  diabetes and this should be confirmed with a follow-up  test. . For someone with known diabetes, a value <7% indicates  that their diabetes is well controlled and a value  greater than or equal to 7% indicates suboptimal  control. A1c targets should be individualized based on  duration of diabetes, age, comorbid conditions, and  other considerations. . Currently, no consensus exists regarding use of hemoglobin A1c for diagnosis of diabetes for children. .     CBG: No results for input(s): GLUCAP in the last 168 hours.  Review of Systems:   Intubated and sedated  Past Medical History:  She,  has a past medical history of Allergic rhinitis, Asthma, Atypical chest pain, Chronic diastolic CHF (congestive heart failure) (HCC), Chronic diastolic heart failure (HCC) (91/87/7977), CKD (chronic kidney disease), stage II, Diabetes mellitus type II, GERD (gastroesophageal reflux disease), Headache(784.0), Hypertension, Hypertensive heart disease, Microcytic anemia, OSA (obstructive sleep apnea), Osteoarthritis, and Right upper quadrant pain (06/04/2022).   Surgical History:   Past Surgical History:  Procedure Laterality Date   RIGHT HEART CATH N/A 10/25/2023   Procedure: RIGHT HEART CATH;  Surgeon: Zenaida Morene PARAS, MD;  Location: [facility] INVASIVE CV LAB;  Service:  Cardiovascular;  Laterality: N/A;   SHOULDER ARTHROSCOPY Bilateral    TUBAL LIGATION       Social History:   reports that she quit smoking about 13 years ago. Her smoking use included cigarettes. She has never used smokeless tobacco. She reports that she does not drink alcohol and does not use drugs.   Family History:  Her family history includes Cancer in her father and another family member; Diabetes in an other family member; Heart attack in her mother; Hypertension in her brother, maternal grandfather, maternal grandmother, mother, and another family member; Stroke in an other family member. There is no history of Kidney disease, Alcohol abuse, Asthma, COPD, Depression, Drug abuse, Early death, Hearing loss, Heart disease, or Hyperlipidemia.  Allergies Allergies[1]   Home Medications  Prior to Admission medications  Medication Sig Start Date End Date Taking? Authorizing Provider  amLODipine  (NORVASC ) 10 MG tablet Take 1 tablet (10 mg total) by mouth daily. 10/25/23   Raford Riggs, MD  atorvastatin  (LIPITOR) 20 MG tablet Take 1 tablet (20 mg total) by mouth daily. 10/25/23   Raford Riggs, MD  blood glucose meter kit and supplies Dispense based on patient and insurance preference. Use up to four times daily as directed. (FOR ICD-10 E10.9, E11.9). 03/24/22   Jason Leita Repine, FNP  blood glucose meter kit and supplies Use 1 each in the morning, at noon, and at bedtime. 05/26/24   Almarie Waddell NOVAK, NP  Continuous Glucose Sensor (DEXCOM G7 SENSOR) MISC Use as directed; Place 1 sensor every 10 days 05/26/24     Dulaglutide  (TRULICITY ) 1.5 MG/0.5ML SOAJ Inject 1.5 mg into the skin once a week. 03/22/24     Dulaglutide  (TRULICITY ) 1.5 MG/0.5ML SOAJ Inject 1.5 mg into the skin once a week. 05/09/24     furosemide  (LASIX ) 80 MG tablet Take 1 tablet (80 mg total) by mouth 2 (two) times daily. You may take an extra 40mg  (1/2 tablet) by mouth daily as needed for swelling. 11/08/23   Clegg,  Amy D, NP  hydrALAZINE  (APRESOLINE ) 100 MG tablet Take 1 tablet (100 mg total) by mouth 3 (three) times daily. 04/11/24   Raford Riggs, MD  insulin  aspart (NOVOLOG  FLEXPEN) 100 UNIT/ML FlexPen Continuing sliding scale as previously prescribed. Patient taking differently: Inject 0-2 Units into the skin daily as needed for high blood sugar. Sliding scale  Take 2 units if blood Glucose is over 200 10/05/23   Goodrich, Callie E, PA-C  insulin  glargine (LANTUS  SOLOSTAR) 100 UNIT/ML Solostar Pen Inject 45 Units into the skin in the morning. 03/01/24     insulin  glargine (LANTUS  SOLOSTAR) 100 UNIT/ML Solostar Pen Inject 35 units into the skin every morning. 05/09/24     insulin  lispro (HUMALOG ) 100 UNIT/ML KwikPen Use 3 times daily as directed. Max daily dose is 50 units. 06/07/24     Insulin  Pen Needle 32G X 4 MM MISC 1 Device by Does not apply route in the morning, at noon, in the evening, and at bedtime. 12/10/22   Shamleffer, Ibtehal Jaralla, MD  isosorbide  mononitrate (IMDUR ) 120 MG 24 hr tablet Take 1 tablet (120 mg total) by mouth daily. 10/25/23   Zenaida Morene PARAS, MD  moxifloxacin  (VIGAMOX ) 0.5 % ophthalmic solution Administer into right eye every 2 to 3 hours. 05/19/24     prednisoLONE  acetate (PRED FORTE ) 1 % ophthalmic suspension Administer into right eye every hour. 05/19/24        Critical care time: 11   The patient is critically ill with multiple organ system failure and requires high complexity decision making for assessment and support, frequent evaluation and titration of therapies, advanced monitoring, review of radiographic studies and interpretation of complex data.   Critical Care Time devoted to patient care services, exclusive of separately billable procedures, described in this note is 32 minutes.   Orlin Fairly, MD [facility] See Amion for pager  If no response to pager , please call [phone] until 7pm After 7:00 pm call Elink   [phone] 07/19/2024, 7:50 PM            [1]  Allergies Allergen Reactions   Latex Itching and Rash   Lisinopril Other (See Comments)    Headache   "

## 2024-07-19 NOTE — ED Triage Notes (Addendum)
 Pt BIB [facility] from home d/t unresponsiveness. Pt climbed to the top the stairs. Fell forwards. Nola in the 20s. Pt intubated. 5mg  versed .

## 2024-07-19 NOTE — Progress Notes (Signed)
" °   07/19/24 1930  Spiritual Encounters  Type of Visit Initial  Care provided to: [facility] partners present during encounter Physician  Referral source Nurse (RN/NT/LPN)  Reason for visit Urgent spiritual support  OnCall Visit Yes   Responded to call to ED for family of patient in trauma A. Spoke with Doctor who reported that patient will be admitted to the ICU. Visited with family in consult room A. Family preferred not to receive support from chaplain.   Chaplain revisited family (daughter) in waiting room to provided update. Per nurse patient will be moved to a room, however room is not yet available.      "

## 2024-07-19 NOTE — ED Provider Notes (Signed)
 " [place] [facility] AT [facility] Provider Note   CSN: [id number] Arrival date & time: 07/19/24  1845     Patient presents with: Bradycardia   Lori Mclaughlin is a 63 y.o. female.  With a history of heart failure, sleep apnea, type 2 diabetes, CKD and bradycardia who presents to the ED via EMS after an unresponsive episode.  Patient was at home with her family last night and 2 of her family members were helping her to the top of the stairs so that she could take a shower.  When she reached the top of the stairs she reported shortness of breath to her family members.  They then noticed her breathing to slow and she became unconscious.  EMS was called and noted patient to be apneic upon their arrival.  She was intubated for airway protection.  EMS also noted patient to be bradycardic and hypertensive.  She was placed on transcutaneous pacing and remained paced on arrival to the ED.  Family member's deny recent illness but do states she has had an episode of syncope recently but was not evaluated for this reason.   HPI     Prior to Admission medications  Medication Sig Start Date End Date Taking? Authorizing Provider  amLODipine  (NORVASC ) 10 MG tablet Take 1 tablet (10 mg total) by mouth daily. 10/25/23   Raford Riggs, MD  atorvastatin  (LIPITOR) 20 MG tablet Take 1 tablet (20 mg total) by mouth daily. 10/25/23   Raford Riggs, MD  blood glucose meter kit and supplies Dispense based on patient and insurance preference. Use up to four times daily as directed. (FOR ICD-10 E10.9, E11.9). 03/24/22   Jason Leita Repine, FNP  blood glucose meter kit and supplies Use 1 each in the morning, at noon, and at bedtime. 05/26/24   Almarie Waddell NOVAK, NP  Continuous Glucose Sensor (DEXCOM G7 SENSOR) MISC Use as directed; Place 1 sensor every 10 days 05/26/24     Dulaglutide  (TRULICITY ) 1.5 MG/0.5ML SOAJ Inject 1.5 mg into the skin once a week. 03/22/24     Dulaglutide   (TRULICITY ) 1.5 MG/0.5ML SOAJ Inject 1.5 mg into the skin once a week. 05/09/24     furosemide  (LASIX ) 80 MG tablet Take 1 tablet (80 mg total) by mouth 2 (two) times daily. You may take an extra 40mg  (1/2 tablet) by mouth daily as needed for swelling. 11/08/23   Clegg, Amy D, NP  hydrALAZINE  (APRESOLINE ) 100 MG tablet Take 1 tablet (100 mg total) by mouth 3 (three) times daily. 04/11/24   Raford Riggs, MD  insulin  aspart (NOVOLOG  FLEXPEN) 100 UNIT/ML FlexPen Continuing sliding scale as previously prescribed. Patient taking differently: Inject 0-2 Units into the skin daily as needed for high blood sugar. Sliding scale  Take 2 units if blood Glucose is over 200 10/05/23   Goodrich, Callie E, PA-C  insulin  glargine (LANTUS  SOLOSTAR) 100 UNIT/ML Solostar Pen Inject 45 Units into the skin in the morning. 03/01/24     insulin  glargine (LANTUS  SOLOSTAR) 100 UNIT/ML Solostar Pen Inject 35 units into the skin every morning. 05/09/24     insulin  lispro (HUMALOG ) 100 UNIT/ML KwikPen Use 3 times daily as directed. Max daily dose is 50 units. 06/07/24     Insulin  Pen Needle 32G X 4 MM MISC 1 Device by Does not apply route in the morning, at noon, in the evening, and at bedtime. 12/10/22   Shamleffer, Ibtehal Jaralla, MD  isosorbide  mononitrate (IMDUR ) 120 MG 24 hr tablet  Take 1 tablet (120 mg total) by mouth daily. 10/25/23   Zenaida Morene PARAS, MD  moxifloxacin  (VIGAMOX ) 0.5 % ophthalmic solution Administer into right eye every 2 to 3 hours. 05/19/24     prednisoLONE  acetate (PRED FORTE ) 1 % ophthalmic suspension Administer into right eye every hour. 05/19/24       Allergies: Latex and Lisinopril    Review of Systems  Updated Vital Signs BP (!) 142/92   Pulse 60   Temp (!) 96.7 F (35.9 C) (Temporal)   Resp 14   Ht 5' (1.524 m)   LMP 02/23/2012   SpO2 100%   BMI 49.22 kg/m   Physical Exam Vitals and nursing note reviewed.  HENT:     Head: Normocephalic and atraumatic.  Eyes:     Pupils: Pupils  are equal, round, and reactive to light.  Cardiovascular:     Rate and Rhythm: Normal rate and regular rhythm.  Pulmonary:     Effort: Pulmonary effort is normal.     Breath sounds: Normal breath sounds.     Comments: Intubated Abdominal:     Palpations: Abdomen is soft.     Tenderness: There is no abdominal tenderness.  Musculoskeletal:        General: No swelling.     Cervical back: Neck supple. No tenderness.  Skin:    General: Skin is warm and dry.  Neurological:     Mental Status: She is alert.     Comments: Awake alert blinking on command moving all 4 extremities on command while intubated  Psychiatric:        Mood and Affect: Mood normal.     (all labs ordered are listed, but only abnormal results are displayed) Labs Reviewed  COMPREHENSIVE METABOLIC PANEL WITH GFR - Abnormal; Notable for the following components:      Result Value   CO2 16 (*)    Glucose, Bld 251 (*)    BUN 45 (*)    Creatinine, Ser 3.07 (*)    AST 59 (*)    Alkaline Phosphatase 246 (*)    GFR, Estimated 16 (*)    Anion gap 20 (*)    All other components within normal limits  CBC WITH DIFFERENTIAL/PLATELET - Abnormal; Notable for the following components:   Hemoglobin 9.8 (*)    HCT 32.4 (*)    MCV 74.0 (*)    MCH 22.4 (*)    RDW 22.6 (*)    All other components within normal limits  PROTIME-INR - Abnormal; Notable for the following components:   Prothrombin Time 17.0 (*)    INR 1.3 (*)    All other components within normal limits  URINALYSIS, W/ REFLEX TO CULTURE (INFECTION SUSPECTED) - Abnormal; Notable for the following components:   APPearance HAZY (*)    Glucose, UA 50 (*)    Protein, ur >=300 (*)    Bacteria, UA RARE (*)    All other components within normal limits  PRO BRAIN NATRIURETIC PEPTIDE - Abnormal; Notable for the following components:   Pro Brain Natriuretic Peptide 23,439.0 (*)    All other components within normal limits  I-STAT CG4 LACTIC ACID, ED - Abnormal; Notable  for the following components:   Lactic Acid, Venous 5.1 (*)    All other components within normal limits  I-STAT CHEM 8, ED - Abnormal; Notable for the following components:   BUN 56 (*)    Creatinine, Ser 3.10 (*)    Glucose, Bld 245 (*)  Calcium , Ion 1.02 (*)    TCO2 18 (*)    Hemoglobin 11.9 (*)    HCT 35.0 (*)    All other components within normal limits  I-STAT VENOUS BLOOD GAS, ED - Abnormal; Notable for the following components:   pCO2, Ven 41.0 (*)    pO2, Ven 85 (*)    Bicarbonate 18.3 (*)    TCO2 20 (*)    Acid-base deficit 8.0 (*)    Calcium , Ion 1.03 (*)    HCT 35.0 (*)    Hemoglobin 11.9 (*)    All other components within normal limits  RESP PANEL BY RT-PCR (RSV, FLU A&B, COVID)  RVPGX2  MRSA NEXT GEN BY PCR, NASAL  CULTURE, RESPIRATORY W GRAM STAIN  TRIGLYCERIDES  TSH  T4, FREE  BLOOD GAS, ARTERIAL  CBC  CREATININE, SERUM  CBC  BASIC METABOLIC PANEL WITH GFR  BLOOD GAS, ARTERIAL  MAGNESIUM   PHOSPHORUS  HEMOGLOBIN A1C  I-STAT CG4 LACTIC ACID, ED    EKG: None  Radiology: DG Abd Portable 1 View Result Date: 07/19/2024 EXAM: 1 VIEW XRAY OF THE ABDOMEN 07/19/2024 08:14:00 PM COMPARISON: None available. CLINICAL HISTORY: OG tube placement FINDINGS: LINES, TUBES AND DEVICES: Enteric tube in place with tip overlying the midbody of the stomach. BOWEL: Distended stomach with air. Nonobstructive bowel gas pattern. SOFT TISSUES: Overlapping cardiac leads and defibrillator pads noted. No abnormal calcifications. BONES: No acute fracture. IMPRESSION: 1. Enteric tube in satisfactory position. Electronically signed by: Norman Gatlin MD 07/19/2024 08:23 PM EST RP Workstation: [id number]   CT Cervical Spine Wo Contrast Result Date: 07/19/2024 CLINICAL DATA:  Neck trauma (Age >= 65y) EXAM: CT CERVICAL SPINE WITHOUT CONTRAST TECHNIQUE: Multidetector CT imaging of the cervical spine was performed without intravenous contrast. Multiplanar CT image reconstructions were  also generated. RADIATION DOSE REDUCTION: This exam was performed according to the departmental dose-optimization program which includes automated exposure control, adjustment of the mA and/or kV according to patient size and/or use of iterative reconstruction technique. COMPARISON:  None Available. FINDINGS: Alignment: Straightening of normal lordosis. Trace anterolisthesis of C3 on C4. No traumatic subluxation. Skull base and vertebrae: No acute fracture. Vertebral body heights are maintained. The dens and skull base are intact. Soft tissues and spinal canal: No canal hematoma. Pharyngeal fluid may be related to intubation. There is patchy soft tissue gas within the left parapharyngeal and submandibular space with ill-defined fat planes. Disc levels: Mild diffuse degenerative disc disease. Mild multilevel facet hypertrophy. Upper chest: Assessed on concurrent chest CT, reported separately. Other: Carotid calcifications.  Patient is intubated. IMPRESSION: 1. No acute fracture or traumatic subluxation of the cervical spine. 2. Patchy soft tissue gas within the left parapharyngeal and submandibular space with ill-defined fat planes. This is of uncertain etiology, recommend correlation for laceration. No evidence of facial bone fracture to account for soft tissue gas. If there is clinical concern for infection, consider further assessment with soft tissue neck CT. Electronically Signed   By: Andrea Gasman M.D.   On: 07/19/2024 20:04   CT Angio Chest/Abd/Pel for Dissection W and/or Wo Contrast Result Date: 07/19/2024 EXAM: CTA CHEST, ABDOMEN AND PELVIS WITHOUT AND WITH CONTRAST 07/19/2024 07:45:09 PM TECHNIQUE: CTA of the chest was performed without and with the administration of 100 mL of intravenous iohexol  (OMNIPAQUE ) 350 MG/ML injection. CTA of the abdomen and pelvis was performed with the administration of 100 mL of intravenous iohexol  (OMNIPAQUE ) 350 MG/ML injection. Multiplanar reformatted images are  provided for review. MIP images are provided  for review. Automated exposure control, iterative reconstruction, and/or weight based adjustment of the mA/kV was utilized to reduce the radiation dose to as low as reasonably achievable. COMPARISON: Same day x-ray. CLINICAL HISTORY: Acute aortic syndrome (AAS) suspected. Unresponsiveness. Bradycardia. FINDINGS: VASCULATURE: AORTA: No acute aortic syndrome. Coronary artery and aortic atherosclerotic calcifications. No abdominal aortic aneurysm. No dissection. Reflux of contrast into the IVC and hepatic veins compatible with elevated right heart pressures. PULMONARY ARTERIES: Negative for pulmonary embolism. Dilated main pulmonary artery measuring 34 mm which can be seen with pulmonary hypertension. GREAT VESSELS OF AORTIC ARCH: No acute finding. No dissection. No arterial occlusion or significant stenosis. CELIAC TRUNK: No acute finding. No occlusion or significant stenosis. SUPERIOR MESENTERIC ARTERY: No acute finding. No occlusion or significant stenosis. INFERIOR MESENTERIC ARTERY: No acute finding. No occlusion or significant stenosis. RENAL ARTERIES: No acute finding. No occlusion or significant stenosis. ILIAC ARTERIES: No acute finding. No occlusion or significant stenosis. CHEST: MEDIASTINUM: Cardiomegaly. No mediastinal lymphadenopathy. No pericardial effusion. LUNGS AND PLEURA: Endotracheal tube tip at the origin of the right mainstem bronchus at the level of the carina. Recommend retraction. Bronchovascular consolidation in the left lower lobe suspicious for aspiration or pneumonia. Scarring and atelectasis in the right lower lobe. No evidence of pleural effusion or pneumothorax. THORACIC BONES AND SOFT TISSUES: Body wall anasarca. No acute bone or soft tissue abnormality. ABDOMEN AND PELVIS: LIVER: No acute. GALLBLADDER AND BILE DUCTS: Gallbladder is unremarkable. No biliary ductal dilatation. SPLEEN: The spleen is unremarkable. PANCREAS: The pancreas is  unremarkable. ADRENAL GLANDS: Bilateral adrenal glands demonstrate no acute abnormality. KIDNEYS, URETERS AND BLADDER: No stones in the kidneys or ureters. No hydronephrosis. No perinephric or periureteral stranding. Diffuse bladder wall thickening with perivesical stranding and fluid. Correlate for cystitis. GI AND BOWEL: Colonic diverticulosis without evidence of diverticulitis. Normal appendix. Stomach and duodenal sweep demonstrate no acute abnormality. There is no bowel obstruction. No abnormal bowel wall thickening or distension. REPRODUCTIVE: Reproductive organs are unremarkable. PERITONEUM AND RETROPERITONEUM: Small volume abdominal or pelvic ascites. No free air. LYMPH NODES: No lymphadenopathy. ABDOMINAL BONES AND SOFT TISSUES: Body wall anasarca. No acute abnormality of the bones. No acute soft tissue abnormality. IMPRESSION: 1. Endotracheal tube tip at the origin of the right mainstem bronchus at the level of the carina. Recommend retraction. 2. No acute aortic syndrome or pulmonary embolism . 3. Cardiomegaly with signs heart failure and of 3rd spacing of fluid including body wall anasarca, abdominal pelvic ascites, and reflux with contrast into the IVC and hepatic veins. 4. Dilated main pulmonary artery measuring 34 mm which can be seen with pulmonary hypertension. 5. Bronchovascular consolidation in the left lower lobe suspicious for aspiration or pneumonia. 6. Diffuse bladder wall thickening with perivesical stranding and fluid. Correlate for cystitis. Electronically signed by: Norman Gatlin MD 07/19/2024 08:04 PM EST RP Workstation: [id number]   CT Head Wo Contrast Result Date: 07/19/2024 CLINICAL DATA:  Provided history: Head trauma, minor (Age >= 65y) EXAM: CT HEAD WITHOUT CONTRAST TECHNIQUE: Contiguous axial images were obtained from the base of the skull through the vertex without intravenous contrast. RADIATION DOSE REDUCTION: This exam was performed according to the departmental  dose-optimization program which includes automated exposure control, adjustment of the mA and/or kV according to patient size and/or use of iterative reconstruction technique. COMPARISON:  None Available. FINDINGS: Brain: No intracranial hemorrhage, mass effect, or midline shift. Brain volume is normal for age. No hydrocephalus. The basilar cisterns are patent. Periventricular and deep white matter hypodensity, nonspecific but typical  of chronic small vessel ischemia. Faint symmetric basal gangliar mineralization. No evidence of territorial infarct or acute ischemia. No extra-axial or intracranial fluid collection. Vascular: Atherosclerosis of skullbase vasculature without hyperdense vessel or abnormal calcification. Skull: No fracture or focal lesion. Sinuses/Orbits: Small mucous retention cyst in the right maxillary sinus. Bilateral cataract resection Other: None. IMPRESSION: 1. No acute intracranial abnormality. No skull fracture. 2. Chronic small vessel ischemia. Electronically Signed   By: Andrea Gasman M.D.   On: 07/19/2024 19:58   DG Chest Portable 1 View Result Date: 07/19/2024 CLINICAL DATA:  Chest pain. EXAM: PORTABLE CHEST 1 VIEW COMPARISON:  Chest radiograph dated 08/09/2023. FINDINGS: Endotracheal tube approximately 2 cm above the carina. Shallow inspiration. There is cardiomegaly. No focal consolidation, pleural effusion, pneumothorax. No acute osseous pathology. IMPRESSION: 1. Endotracheal tube above the carina. 2. Cardiomegaly. Electronically Signed   By: Vanetta Chou M.D.   On: 07/19/2024 19:17     .Critical Care  Performed by: Pamella Ozell LABOR, DO Authorized by: Pamella Ozell LABOR, DO   Critical care provider statement:    Critical care time (minutes):  50   Critical care was necessary to treat or prevent imminent or life-threatening deterioration of the following conditions:  Respiratory failure and cardiac failure   Critical care was time spent personally by me on the  following activities:  Development of treatment plan with patient or surrogate, discussions with consultants, evaluation of patient's response to treatment, examination of patient, ordering and review of laboratory studies, ordering and review of radiographic studies, ordering and performing treatments and interventions, pulse oximetry, re-evaluation of patient's condition, review of old charts and obtaining history from patient or surrogate   I assumed direction of critical care for this patient from another provider in my specialty: no     Care discussed with: admitting provider   Ultrasound ED Echo  Date/Time: 07/19/2024 7:00 PM  Performed by: Pamella Ozell LABOR, DO Authorized by: Pamella Ozell LABOR, DO   Procedure details:    Indications: dyspnea and syncope     Views: subxiphoid, parasternal long axis view, parasternal short axis view, apical 4 chamber view and IVC view     Images: archived   Findings:    Pericardium: no pericardial effusion     LV Function: normal (>50% EF)     RV Diameter: normal     IVC: normal   Impression:    Impression: normal   Ultrasound ED Thoracic  Date/Time: 07/19/2024 8:40 PM  Performed by: Pamella Ozell LABOR, DO Authorized by: Pamella Ozell LABOR, DO   Procedure details:    Indications: dyspnea     Assessment for:  Interstitial syndrome   Left lung pleural:  Visualized   Right lung pleural:  Visualized   Images: archived   Findings:    B-lines noted throughout: identified   Right Lung Findings:     right lung sliding Left Lung Findings:     left lung sliding Impression:    Impression: interstitial syndrome   Comments:     Diffuse B-lines left greater than right    Medications Ordered in the ED  fentaNYL  (SUBLIMAZE ) injection 25-50 mcg ( Intravenous Not Given 07/19/24 1908)  fentaNYL  in NS (40mcg/ml) infusion-PREMIX (100 mcg/hr Intravenous Infusion Verify 07/19/24 2039)  fentaNYL  (SUBLIMAZE ) bolus via infusion 25-100 mcg (has no  administration in time range)  propofol  (DIPRIVAN ) 1000 MG/100ML infusion (30 mcg/kg/min  114 kg (Order-Specific) Intravenous Infusion Verify 07/19/24 2039)  Chlorhexidine  Gluconate Cloth 2 % PADS 6  each (has no administration in time range)  heparin  injection 5,000 Units (has no administration in time range)  famotidine  (PEPCID ) tablet 20 mg (has no administration in time range)  pantoprazole  (PROTONIX ) injection 40 mg (has no administration in time range)  ondansetron  (ZOFRAN ) injection 4 mg (has no administration in time range)  insulin  aspart (novoLOG ) injection 0-9 Units (has no administration in time range)  cefTRIAXone  (ROCEPHIN ) 1 g in sodium chloride  0.9 % 100 mL IVPB (has no administration in time range)  azithromycin  (ZITHROMAX ) 500 mg in sodium chloride  0.9 % 250 mL IVPB (has no administration in time range)  iohexol  (OMNIPAQUE ) 350 MG/ML injection 100 mL (100 mLs Intravenous Contrast Given 07/19/24 1951)                                    Medical Decision Making 63 year old female with history as above brought in by EMS after unresponsive episode at home.  This occurred after 2 of her family numbers helped her to climb stairs.  She was intubated in the field due to apnea and started on transcutaneous pacing due to bradycardia.  She has been hypertensive throughout and has maintained pulses.  Upon arrival she remains intubated but is awake alert following my commands moving all 4 extremities.  She remained on transcutaneous pacing until she was changed over to our ZOLL monitor.  At that time she regained her own native normal sinus rhythm with rate in the 70s.  Blood pressure may remains hypertensive.  Given the unclear cause of her respiratory distress and unresponsive episode she will remain intubated and we initiated sedation with propofol  fentanyl .  No evidence of right heart strain on my exam but she does have B-lines most notably in the left lung fields.  Differential diagnosis  includes heart failure exacerbation, dysrhythmia and heart block, acute hypoxemic respiratory failure, pneumonia, viral respiratory illness, PE and electrolyte balance.  Will obtain CT head C-spine to evaluate for traumatic injury as well as CTA chest abdomen pelvis to evaluate for PE or aortic/intra-abdominal pathology.  Discussed with ICU team who will accept patient to ICU.  I have informed the patient's family members here in the emergency department of her clinical status and plan for ICU admission.  Amount and/or Complexity of Data Reviewed Labs: ordered. Radiology: ordered.  Risk Prescription drug management. Decision regarding hospitalization.        Final diagnoses:  Bradycardia  Acute hypoxemic respiratory failure (HCC)  Acute on chronic heart failure, unspecified heart failure type [facility])    ED Discharge Orders     None          Pamella Ozell LABOR, DO 07/19/24 2041  "

## 2024-07-19 NOTE — Progress Notes (Signed)
 Pt transported via ventilator to CT and back w/o complications.

## 2024-07-19 NOTE — Progress Notes (Signed)
 eLink Physician-Brief Progress Note Patient Name: MIYA LUVIANO DOB: 16-Mar-1961 MRN: [id number]   Date of Service  07/19/2024  HPI/Events of Note  63/M with DM, CKD and hypertension, recent hx of syncope, who was brought in from home after she collapsed and became unresponsive.  EMS noted that the patient was hypertensive and bradycardic. She was intubated in the field.    BP 151/79, HR 42, RR 10, O2 sats 100%  Pt is intubated and sedated.   eICU Interventions  Continue empiric antibiotics.  Check echo.  Insulin  for glucose control.  Famotidine  for GI prophylaxis. Will d/c pantoprazole .  Heparin  for DVT Prophylaxis.      Intervention Category Evaluation Type: New Patient Evaluation  Myia Bergh 07/19/2024, 9:41 PM  6:29 AM Notified that patient became bradycardic with HR as low as 20s while she was being suctioned.  Pt was given atropine  with improvement in HR and BP.   BP 109/77, HR 57, RR 15, O2 sats 100%.   Plan> Hold all AV nodal blocking agents.   Consult cardiology.  Get another EKG. Atropine  PRN for HR <40.

## 2024-07-19 NOTE — Progress Notes (Signed)
 Pt transported via ventilator from ED-ICU w/o complications. RN RT, and ICU staff at bedside.

## 2024-07-19 NOTE — Progress Notes (Signed)
 ETT pulled back 2cm per MD, w/o complications. RT and RN at bedside.

## 2024-07-20 ENCOUNTER — Inpatient Hospital Stay (HOSPITAL_COMMUNITY)

## 2024-07-20 DIAGNOSIS — R4182 Altered mental status, unspecified: Secondary | ICD-10-CM

## 2024-07-20 DIAGNOSIS — I5022 Chronic systolic (congestive) heart failure: Secondary | ICD-10-CM

## 2024-07-20 DIAGNOSIS — D696 Thrombocytopenia, unspecified: Secondary | ICD-10-CM

## 2024-07-20 DIAGNOSIS — D631 Anemia in chronic kidney disease: Secondary | ICD-10-CM

## 2024-07-20 DIAGNOSIS — N1832 Chronic kidney disease, stage 3b: Secondary | ICD-10-CM | POA: Diagnosis not present

## 2024-07-20 DIAGNOSIS — G4733 Obstructive sleep apnea (adult) (pediatric): Secondary | ICD-10-CM

## 2024-07-20 DIAGNOSIS — I130 Hypertensive heart and chronic kidney disease with heart failure and stage 1 through stage 4 chronic kidney disease, or unspecified chronic kidney disease: Secondary | ICD-10-CM

## 2024-07-20 DIAGNOSIS — G9341 Metabolic encephalopathy: Secondary | ICD-10-CM

## 2024-07-20 DIAGNOSIS — E1122 Type 2 diabetes mellitus with diabetic chronic kidney disease: Secondary | ICD-10-CM | POA: Diagnosis not present

## 2024-07-20 DIAGNOSIS — J189 Pneumonia, unspecified organism: Secondary | ICD-10-CM

## 2024-07-20 DIAGNOSIS — I509 Heart failure, unspecified: Secondary | ICD-10-CM | POA: Diagnosis not present

## 2024-07-20 DIAGNOSIS — Z794 Long term (current) use of insulin: Secondary | ICD-10-CM

## 2024-07-20 DIAGNOSIS — J9601 Acute respiratory failure with hypoxia: Secondary | ICD-10-CM | POA: Diagnosis not present

## 2024-07-20 LAB — POCT I-STAT 7, (LYTES, BLD GAS, ICA,H+H)
Acid-base deficit: 6 mmol/L — ABNORMAL HIGH (ref 0.0–2.0)
Bicarbonate: 20.2 mmol/L (ref 20.0–28.0)
Calcium, Ion: 1.16 mmol/L (ref 1.15–1.40)
HCT: 28 % — ABNORMAL LOW (ref 36.0–46.0)
Hemoglobin: 9.5 g/dL — ABNORMAL LOW (ref 12.0–15.0)
O2 Saturation: 100 %
Patient temperature: 96.4
Potassium: 4.1 mmol/L (ref 3.5–5.1)
Sodium: 143 mmol/L (ref 135–145)
TCO2: 21 mmol/L — ABNORMAL LOW (ref 22–32)
pCO2 arterial: 39.6 mmHg (ref 32–48)
pH, Arterial: 7.31 — ABNORMAL LOW (ref 7.35–7.45)
pO2, Arterial: 325 mmHg — ABNORMAL HIGH (ref 83–108)

## 2024-07-20 LAB — CBC
HCT: 26.7 % — ABNORMAL LOW (ref 36.0–46.0)
Hemoglobin: 8.3 g/dL — ABNORMAL LOW (ref 12.0–15.0)
MCH: 22.6 pg — ABNORMAL LOW (ref 26.0–34.0)
MCHC: 31.1 g/dL (ref 30.0–36.0)
MCV: 72.8 fL — ABNORMAL LOW (ref 80.0–100.0)
Platelets: 118 K/uL — ABNORMAL LOW (ref 150–400)
RBC: 3.67 MIL/uL — ABNORMAL LOW (ref 3.87–5.11)
RDW: 21.9 % — ABNORMAL HIGH (ref 11.5–15.5)
WBC: 6.4 K/uL (ref 4.0–10.5)
nRBC: 0 % (ref 0.0–0.2)

## 2024-07-20 LAB — BASIC METABOLIC PANEL WITH GFR
Anion gap: 12 (ref 5–15)
BUN: 46 mg/dL — ABNORMAL HIGH (ref 8–23)
CO2: 21 mmol/L — ABNORMAL LOW (ref 22–32)
Calcium: 8.4 mg/dL — ABNORMAL LOW (ref 8.9–10.3)
Chloride: 109 mmol/L (ref 98–111)
Creatinine, Ser: 3 mg/dL — ABNORMAL HIGH (ref 0.44–1.00)
GFR, Estimated: 17 mL/min — ABNORMAL LOW
Glucose, Bld: 205 mg/dL — ABNORMAL HIGH (ref 70–99)
Potassium: 4.1 mmol/L (ref 3.5–5.1)
Sodium: 141 mmol/L (ref 135–145)

## 2024-07-20 LAB — GLUCOSE, CAPILLARY
Glucose-Capillary: 114 mg/dL — ABNORMAL HIGH (ref 70–99)
Glucose-Capillary: 121 mg/dL — ABNORMAL HIGH (ref 70–99)
Glucose-Capillary: 124 mg/dL — ABNORMAL HIGH (ref 70–99)
Glucose-Capillary: 147 mg/dL — ABNORMAL HIGH (ref 70–99)
Glucose-Capillary: 192 mg/dL — ABNORMAL HIGH (ref 70–99)
Glucose-Capillary: 193 mg/dL — ABNORMAL HIGH (ref 70–99)

## 2024-07-20 LAB — ECHOCARDIOGRAM COMPLETE
AR max vel: 1.65 cm2
AV Area VTI: 1.55 cm2
AV Area mean vel: 1.7 cm2
AV Mean grad: 3.3 mmHg
AV Peak grad: 7.2 mmHg
Ao pk vel: 1.34 m/s
Area-P 1/2: 3.34 cm2
Calc EF: 36.6 %
Height: 60 in
S' Lateral: 3.9 cm
Single Plane A2C EF: 38 %
Single Plane A4C EF: 40.2 %
Weight: 4687.86 [oz_av]

## 2024-07-20 LAB — PHOSPHORUS: Phosphorus: 4.8 mg/dL — ABNORMAL HIGH (ref 2.5–4.6)

## 2024-07-20 LAB — LACTIC ACID, PLASMA
Lactic Acid, Venous: 1.6 mmol/L (ref 0.5–1.9)
Lactic Acid, Venous: 1.5 mmol/L (ref 0.5–1.9)

## 2024-07-20 LAB — AMMONIA: Ammonia: 64 umol/L — ABNORMAL HIGH (ref 9–35)

## 2024-07-20 LAB — TRIGLYCERIDES: Triglycerides: 68 mg/dL

## 2024-07-20 LAB — MAGNESIUM: Magnesium: 2 mg/dL (ref 1.7–2.4)

## 2024-07-20 MED ORDER — ATROPINE SULFATE 1 MG/10ML IJ SOSY: 1.0000 mg | PREFILLED_SYRINGE | Freq: Once | INTRAMUSCULAR | Status: DC | PRN

## 2024-07-20 MED ORDER — ATROPINE SULFATE 1 MG/10ML IJ SOSY: 1.0000 mg | PREFILLED_SYRINGE | INTRAMUSCULAR | Status: DC | PRN

## 2024-07-20 MED ORDER — FAMOTIDINE 20 MG PO TABS
10.0000 mg | ORAL_TABLET | Freq: Every day | ORAL | Status: DC
  Administered 2024-07-21: 10 mg via ORAL
  Filled 2024-07-20: qty 1

## 2024-07-20 MED ORDER — FUROSEMIDE 10 MG/ML IJ SOLN
120.0000 mg | Freq: Two times a day (BID) | INTRAVENOUS | Status: DC
  Administered 2024-07-20: 120 mg via INTRAVENOUS
  Filled 2024-07-20: qty 12
  Filled 2024-07-20: qty 10

## 2024-07-20 MED ORDER — ARTIFICIAL TEARS OPHTHALMIC OINT
1.0000 | TOPICAL_OINTMENT | OPHTHALMIC | Status: DC | PRN
  Administered 2024-07-20: 1 via OPHTHALMIC
  Filled 2024-07-20: qty 3.5

## 2024-07-20 MED ORDER — FAMOTIDINE 20 MG PO TABS
10.0000 mg | ORAL_TABLET | Freq: Every day | ORAL | Status: DC
  Administered 2024-07-20: 10 mg
  Filled 2024-07-20: qty 1

## 2024-07-20 MED ORDER — FUROSEMIDE 10 MG/ML IJ SOLN: 120.0000 mg | Freq: Two times a day (BID) | INTRAVENOUS | Status: DC

## 2024-07-20 MED ORDER — MAGNESIUM SULFATE IN D5W 1-5 GM/100ML-% IV SOLN
1.0000 g | Freq: Once | INTRAVENOUS | Status: AC
  Administered 2024-07-20: 1 g via INTRAVENOUS
  Filled 2024-07-20: qty 100

## 2024-07-20 MED ORDER — HYDRALAZINE HCL 50 MG PO TABS
100.0000 mg | ORAL_TABLET | Freq: Three times a day (TID) | ORAL | Status: DC
  Administered 2024-07-20: 100 mg
  Filled 2024-07-20: qty 2

## 2024-07-20 MED ORDER — ISOSORBIDE MONONITRATE ER 60 MG PO TB24
120.0000 mg | ORAL_TABLET | Freq: Every day | ORAL | Status: DC
  Administered 2024-07-20: 120 mg via ORAL
  Filled 2024-07-20 (×2): qty 2

## 2024-07-20 MED ORDER — ORAL CARE MOUTH RINSE
15.0000 mL | OROMUCOSAL | Status: DC
  Administered 2024-07-20 – 2024-07-21 (×2): 15 mL via OROMUCOSAL

## 2024-07-20 MED ORDER — INSULIN ASPART 100 UNIT/ML IJ SOLN
0.0000 [IU] | INTRAMUSCULAR | Status: DC
  Administered 2024-07-20 – 2024-07-21 (×5): 2 [IU] via SUBCUTANEOUS
  Filled 2024-07-20 (×5): qty 2

## 2024-07-20 MED ORDER — HYDRALAZINE HCL 20 MG/ML IJ SOLN
10.0000 mg | Freq: Four times a day (QID) | INTRAMUSCULAR | Status: DC | PRN
  Administered 2024-07-20: 10 mg via INTRAVENOUS
  Filled 2024-07-20: qty 1
  Filled 2024-07-20: qty 0.5

## 2024-07-20 MED ORDER — MILRINONE LACTATE IN DEXTROSE 20-5 MG/100ML-% IV SOLN
0.2500 ug/kg/min | INTRAVENOUS | Status: DC
  Administered 2024-07-20 – 2024-07-21 (×2): 0.25 ug/kg/min via INTRAVENOUS
  Filled 2024-07-20 (×3): qty 100

## 2024-07-20 MED ORDER — ORAL CARE MOUTH RINSE: 15.0000 mL | OROMUCOSAL | Status: DC | PRN

## 2024-07-20 MED ORDER — HYDRALAZINE HCL 50 MG PO TABS
100.0000 mg | ORAL_TABLET | Freq: Three times a day (TID) | ORAL | Status: DC
  Filled 2024-07-20: qty 2

## 2024-07-20 MED ORDER — SODIUM CHLORIDE 0.9 % IV SOLN
2.0000 g | INTRAVENOUS | Status: DC
  Administered 2024-07-20 – 2024-07-21 (×2): 2 g via INTRAVENOUS
  Filled 2024-07-20 (×2): qty 20

## 2024-07-20 MED ORDER — FUROSEMIDE 10 MG/ML IJ SOLN
20.0000 mg/h | INTRAVENOUS | Status: DC
  Administered 2024-07-20: 15 mg/h via INTRAVENOUS
  Administered 2024-07-21: 20 mg/h via INTRAVENOUS
  Administered 2024-07-21: 15 mg/h via INTRAVENOUS
  Filled 2024-07-20 (×4): qty 20

## 2024-07-20 MED ORDER — FUROSEMIDE 10 MG/ML IJ SOLN
20.0000 mg | Freq: Once | INTRAMUSCULAR | Status: AC
  Administered 2024-07-20: 20 mg via INTRAVENOUS
  Filled 2024-07-20: qty 2

## 2024-07-20 MED ORDER — ATROPINE SULFATE 1 MG/10ML IJ SOSY
PREFILLED_SYRINGE | INTRAMUSCULAR | Status: AC
  Filled 2024-07-20: qty 10

## 2024-07-20 NOTE — Progress Notes (Signed)
" °  Echocardiogram 2D Echocardiogram has been performed.  Lori Mclaughlin 07/20/2024, 12:12 PM "

## 2024-07-20 NOTE — Procedures (Signed)
 Extubation Procedure Note  Patient Details:   Name: Lori Mclaughlin DOB: [birth year] MRN: [id number]   Airway Documentation:    Vent end date: 07/20/24 Vent end time: 1400   Evaluation  O2 sats: stable throughout Complications: No apparent complications Patient did tolerate procedure well. Bilateral Breath Sounds: Rhonchi, Coarse crackles   Yes  Pt extubated to 3L [place], per order. Pt tolerated well, no stridor present, cuff leak present, RN at bedside, BiPAP at bedside in standby,  CCM aware.   Lori Mclaughlin 07/20/2024, 2:29 PM

## 2024-07-20 NOTE — Consult Note (Addendum)
 "  Cardiology Consultation   Patient ID: Lori Mclaughlin MRN: [id number]; DOB: 01-28-1961  Admit date: 07/19/2024 Date of Consult: 07/20/2024  PCP:  Almarie Waddell NOVAK, NP   [facility] Providers Cardiologist:  Annabella Scarce, MD    Patient Profile: Lori Mclaughlin is a 63 y.o. female with a hx of chronic HFrEF, group 2 pulmonary hypertension, hypertension, hyperlipidemia, type 2 diabetes, CKD, asthma, GERD, anemia, OSA who is being seen 07/20/2024 for the evaluation of bradycardia at the request of critical care.  History of Present Illness: Lori Mclaughlin has history of chronic HFrEF with unknown etiology given underlying CKD preventing cardiac catheterization.  They have admission 09/2023 for CHF or echocardiogram showed new reduced EF 35 to 40% with moderately reduced RV.  Admission complicated by underlying CKD requiring nephrology assistance.  Seen outpatient and had a right heart catheterization 09/2023 demonstrating severely elevated filling pressures, preserved CO, severe primarily group 2 pulmonary hypertension.  GDMT limited by underlying renal disease.  Carvedilol  previously stopped secondary to bradycardia.  She was last seen in clinic 10/2023 and was slight weight gain of approximately 5 pounds after going on vacation and drinking more fluids.  Had mild SOB with exertion.  Lasix  increased for couple days but then resumed back on 80 mg twice daily.  She was on hydralazine  and Imdur  for GDMT.  Currently patient being evaluated for collapse/unresponsive episode while being pushed up the stairs by family.  Reportedly EMS had noted heart rates as low as in the 20s.  Eventually intubated in the field.  She is being worked up for pneumonia versus dependent atelectasis and started on antibiotics.  Lactic on arrival 5.1, no further lactic's.  Still remains to be intubated, last night atropine  was ordered but never given.  Patient is still on the vent however attempting  to be weaned off today.  Family is accompanied with her in the room.  They report that she had an episode approximately 1 month ago where patient had self-reported an episode of syncope but had reported this to be dizziness although this was not witnessed and unable to provide much detail.  Has not had any further episodes of syncope/dizziness until yesterday's event.  They report that she has been compliant with all of her medications, she had been taking her Lasix  and with only mild peripheral edema and chronic shortness of breath he did not report any chest pain or orthopnea at home.  She uses her CPAP.   Past Medical History:  Diagnosis Date   Allergic rhinitis    Asthma    Atypical chest pain    a. 03/2014: normal nuclear stress test.   Chronic diastolic CHF (congestive heart failure) (HCC)    a. Dx 03/2014 with echo - moderate LVH, EF 55-60%, grade 1 diastolic dysfunction, mildly dilated LA.   Chronic diastolic heart failure (HCC) 03/07/2021   CKD (chronic kidney disease), stage II    Diabetes mellitus type II    GERD (gastroesophageal reflux disease)    Headache(784.0)    when my bp is up   Hypertension    Hypertensive heart disease    Microcytic anemia    OSA (obstructive sleep apnea)    severe with AHI 77/hr with nocturnal hypoxemia   Osteoarthritis    Right upper quadrant pain 06/04/2022    Past Surgical History:  Procedure Laterality Date   RIGHT HEART CATH N/A 10/25/2023   Procedure: RIGHT HEART CATH;  Surgeon: Zenaida Morene PARAS, MD;  Location: [facility]  INVASIVE CV LAB;  Service: Cardiovascular;  Laterality: N/A;   SHOULDER ARTHROSCOPY Bilateral    TUBAL LIGATION       Scheduled Meds:  Chlorhexidine  Gluconate Cloth  6 each Topical Daily   famotidine   10 mg Per Tube Daily   heparin   5,000 Units Subcutaneous Q8H   insulin  aspart  0-15 Units Subcutaneous Q4H   mouth rinse  15 mL Mouth Rinse Q2H   Continuous Infusions:  azithromycin  Stopped (07/20/24 0026)   cefTRIAXone   (ROCEPHIN )  IV 2 g (07/20/24 0925)   fentaNYL  infusion INTRAVENOUS 75 mcg/hr (07/20/24 0944)   PRN Meds: artificial tears, atropine , fentaNYL , ondansetron  (ZOFRAN ) IV, mouth rinse  Allergies:   Allergies[1]  Social History:   Social History   Socioeconomic History   Marital status: Divorced    Spouse name: Not on file   Number of children: 2   Years of education: Not on file   Highest education level: Some college, no degree  Occupational History   Occupation: retired  Tobacco Use   Smoking status: Former    Current packs/day: 0.00    Types: Cigarettes    Quit date: 08/29/2010    Years since quitting: 13.9   Smokeless tobacco: Never  Vaping Use   Vaping status: Never Used  Substance and Sexual Activity   Alcohol use: No    Alcohol/week: 5.0 standard drinks of alcohol    Types: 5 Glasses of wine per week   Drug use: No   Sexual activity: Yes    Birth control/protection: Surgical  Other Topics Concern   Not on file  Social History Narrative   Regular Exercise- yes   Social Drivers of Health   Tobacco Use: Medium Risk (07/19/2024)   Patient History    Smoking Tobacco Use: Former    Smokeless Tobacco Use: Never    Passive Exposure: Not on Actuary Strain: Low Risk (05/07/2024)   Overall Financial Resource Strain (CARDIA)    Difficulty of Paying Living Expenses: Not hard at all  Food Insecurity: No Food Insecurity (05/22/2024)   Received from [facility]   Epic    Within the past 12 months, you worried that your food would run out before you got the money to buy more.: Never true    Within the past 12 months, the food you bought just didn't last and you didn't have money to get more.: Never true  Transportation Needs: No Transportation Needs (05/22/2024)   Received from [facility]    In the past 12 months, has lack of transportation kept you from medical appointments or from getting medications?: No    In the past 12 months, has lack of  transportation kept you from meetings, work, or from getting things needed for daily living?: No  Physical Activity: Inactive (05/07/2024)   Exercise Vital Sign    Days of Exercise per Week: 0 days    Minutes of Exercise per Session: Not on file  Stress: No Stress Concern Present (05/22/2024)   Received from [facility] - Occupational Stress Questionnaire    Do you feel stress - tense, restless, nervous, or anxious, or unable to sleep at night because your mind is troubled all the time - these days?: Not at all  Social Connections: Moderately Isolated (05/07/2024)   Social Connection and Isolation Panel    Frequency of Communication with Friends and Family: More than three times a week    Frequency  of Social Gatherings with Friends and Family: Once a week    Attends Religious Services: More than 4 times per year    Active Member of [employer] or Organizations: No    Attends Engineer, Structural: Not on file    Marital Status: Divorced  Intimate Partner Violence: Not At Risk (09/27/2023)   Humiliation, Afraid, Rape, and Kick questionnaire    Fear of Current or Ex-Partner: No    Emotionally Abused: No    Physically Abused: No    Sexually Abused: No  Depression (PHQ2-9): Low Risk (05/08/2024)   Depression (PHQ2-9)    PHQ-2 Score: 4  Alcohol Screen: Low Risk (09/30/2023)   Alcohol Screen    Last Alcohol Screening Score (AUDIT): 0  Housing: Unknown (05/22/2024)   Received from [facility]    In the last 12 months, was there a time when you were not able to pay the mortgage or rent on time?: No    Number of Times Moved in the Last Year: Not on file    At any time in the past 12 months, were you homeless or living in a shelter (including now)?: No  Utilities: Not At Risk (05/22/2024)   Received from [facility]    In the past 12 months has the electric, gas, oil, or water  company threatened to shut off services in your  home?: No  Health Literacy: Not on file    Family History:   Family History  Problem Relation Age of Onset   Heart attack Mother    Hypertension Mother    Cancer Father    Hypertension Brother    Hypertension Maternal Grandmother    Hypertension Maternal Grandfather    Diabetes Other    Hypertension Other    Stroke Other    Cancer Other        colon; prostate   Kidney disease Neg Hx    Alcohol abuse Neg Hx    Asthma Neg Hx    COPD Neg Hx    Depression Neg Hx    Drug abuse Neg Hx    Early death Neg Hx    Hearing loss Neg Hx    Heart disease Neg Hx    Hyperlipidemia Neg Hx      ROS:  Please see the history of present illness.  All other ROS reviewed and negative.     Physical Exam/Data: Vitals:   07/20/24 0815 07/20/24 0830 07/20/24 0845 07/20/24 0900  BP: 125/73 125/63 130/60 128/65  Pulse: (!) 51 (!) 52 (!) 50 (!) 49  Resp: 13 11 18 20   Temp: 98.6 F (37 C) 98.6 F (37 C) 98.6 F (37 C) 98.6 F (37 C)  TempSrc:      SpO2: 100% 100% 100% 100%  Weight:      Height:        Intake/Output Summary (Last 24 hours) at 07/20/2024 1037 Last data filed at 07/20/2024 9071 Gross per 24 hour  Intake 834.55 ml  Output 370 ml  Net 464.55 ml      07/20/2024    5:00 AM 07/20/2024   12:00 AM 05/08/2024    1:38 PM  Last 3 Weights  Weight (lbs) 292 lb 15.9 oz 292 lb 15.9 oz 252 lb  Weight (kg) 132.9 kg 132.9 kg 114.306 kg     Body mass index is 57.22 kg/m.  General:  Intubated, drowsy HEENT: normal Neck: Difficult to assess JVD Vascular: No carotid bruits; Distal  pulses 2+ bilaterally Cardiac:  normal S1, S2; RRR; no murmur. Lungs:  clear to auscultation bilaterally, no wheezing, rhonchi or rales. Anteriorly  Abd: soft, nontender, no hepatomegaly  Ext: 2-3+ edema Musculoskeletal:  No deformities, BUE and BLE strength normal and equal Skin: warm and dry  Neuro:  CNs 2-12 intact, no focal abnormalities noted Psych:  Normal affect   EKG:  The EKG was  personally reviewed and demonstrates: Sinus bradycardia, heart rate 58.  History AV block PR 202, right bundle branch block.  No acute ST-T wave changes. Telemetry:  Telemetry was personally reviewed and demonstrates: Sinus rhythm, heart rates 45-60.  2-second pauses occasionally.  Relevant CV Studies: Echocardiogram 10/25/2023 HEMODYNAMICS: RA:       20 mmHg (mean) RV:       85/10,  mmHg PA:       85/30 mmHg (48 mean) PCWP: 30 mmHg (mean) with v waves to 45                                      Estimated Fick CO/CI   9.74L/min, 4.62L/min/m2                                            TPG  18  mmHg                                               PVR  1.8 Wood Units  PAPi  2.75       IMPRESSION: Right heart catheterization for worsening renal function. Elevated cardiac output by assumed Fick, high SVC sat with no evidence of shunt. Severely elevated biventricular filling pressures.  Severe primarily group II pulmonary hypertension. Elevated BP during the case.   RECOMMENDATIONS: Patient reports overall feeling well at home, will continue outpatient management Increase diuretic regimen to 80mg  lasix  BID Increase imdur  to 120mg  daily  Echocardiogram 09/28/2023  1. Left ventricular ejection fraction, by estimation, is 35 to 40%. The  left ventricle has moderately decreased function. The left ventricle  demonstrates global hypokinesis. There is moderate concentric left  ventricular hypertrophy. Left ventricular  diastolic parameters are consistent with Grade II diastolic dysfunction  (pseudonormalization).   2. Right ventricular systolic function is moderately reduced. The right  ventricular size is moderately enlarged. Tricuspid regurgitation signal is  inadequate for assessing PA pressure.   3. Left atrial size was mildly dilated.   4. The mitral valve is grossly normal. Trivial mitral valve  regurgitation. No evidence of mitral stenosis.   5. The aortic valve is tricuspid. Aortic  valve regurgitation is not  visualized. No aortic stenosis is present.   6. The inferior vena cava is dilated in size with <50% respiratory  variability, suggesting right atrial pressure of 15 mmHg.   Comparison(s): LVEF now moderately reduced 35-40% with global HK. Moderate  RV dilation with dysfunction.    Laboratory Data: High Sensitivity Troponin:  No results for input(s): TROPONINIHS in the last 720 hours. No results for input(s): TRNPT in the last 720 hours.    Chemistry Recent Labs  Lab 07/19/24 1852 07/19/24 1904 07/19/24 2111 07/19/24 2239 07/20/24 0305 07/20/24 0353  NA 141 143  143 143  --  141 143  K 4.2 4.2  4.2 3.8  --  4.1 4.1  CL 106 111  --   --  109  --   CO2 16*  --   --   --  21*  --   GLUCOSE 251* 245*  --   --  205*  --   BUN 45* 56*  --   --  46*  --   CREATININE 3.07* 3.10*  --  2.95* 3.00*  --   CALCIUM  8.9  --   --   --  8.4*  --   MG  --   --   --   --  2.0  --   GFRNONAA 16*  --   --  17* 17*  --   ANIONGAP 20*  --   --   --  12  --     Recent Labs  Lab 07/19/24 1852  PROT 7.8  ALBUMIN 3.8  AST 59*  ALT 28  ALKPHOS 246*  BILITOT 1.1   Lipids  Recent Labs  Lab 07/20/24 0305  TRIG 68    Hematology Recent Labs  Lab 07/19/24 1852 07/19/24 1904 07/19/24 2239 07/20/24 0305 07/20/24 0353  WBC 7.8  --  7.1 6.4  --   RBC 4.38  --  3.64* 3.67*  --   HGB 9.8*   < > 8.1* 8.3* 9.5*  HCT 32.4*   < > 26.3* 26.7* 28.0*  MCV 74.0*  --  72.3* 72.8*  --   MCH 22.4*  --  22.3* 22.6*  --   MCHC 30.2  --  30.8 31.1  --   RDW 22.6*  --  21.7* 21.9*  --   PLT 196  --  142* 118*  --    < > = values in this interval not displayed.   Thyroid   Recent Labs  Lab 07/19/24 2239  TSH 2.000  FREET4 1.41    BNP Recent Labs  Lab 07/19/24 1852  PROBNP 23,439.0*    DDimer No results for input(s): DDIMER in the last 168 hours.  Radiology/Studies:  DG CHEST PORT 1 VIEW Result Date: 07/20/2024 CLINICAL DATA:  Ventilator dependence. EXAM:  PORTABLE CHEST 1 VIEW COMPARISON:  07/19/2024 FINDINGS: Endotracheal tube tip is 1.4 cm above the base of the carina. The NG tube passes into the stomach although the distal tip position is not included on the film. The cardio pericardial silhouette is enlarged. There is pulmonary vascular congestion without overt pulmonary edema. No overt airspace edema or pleural effusion. IMPRESSION: 1. Endotracheal tube tip is 1.4 cm above the base of the carina. 2. Pulmonary vascular congestion without overt pulmonary edema. Electronically Signed   By: Camellia Candle M.D.   On: 07/20/2024 10:16   DG Abd Portable 1 View Result Date: 07/19/2024 EXAM: 1 VIEW XRAY OF THE ABDOMEN 07/19/2024 08:14:00 PM COMPARISON: None available. CLINICAL HISTORY: OG tube placement FINDINGS: LINES, TUBES AND DEVICES: Enteric tube in place with tip overlying the midbody of the stomach. BOWEL: Distended stomach with air. Nonobstructive bowel gas pattern. SOFT TISSUES: Overlapping cardiac leads and defibrillator pads noted. No abnormal calcifications. BONES: No acute fracture. IMPRESSION: 1. Enteric tube in satisfactory position. Electronically signed by: Norman Gatlin MD 07/19/2024 08:23 PM EST RP Workstation: [id number]   CT Cervical Spine Wo Contrast Result Date: 07/19/2024 CLINICAL DATA:  Neck trauma (Age >= 65y) EXAM: CT CERVICAL SPINE WITHOUT CONTRAST TECHNIQUE: Multidetector CT imaging of the cervical spine was performed  without intravenous contrast. Multiplanar CT image reconstructions were also generated. RADIATION DOSE REDUCTION: This exam was performed according to the departmental dose-optimization program which includes automated exposure control, adjustment of the mA and/or kV according to patient size and/or use of iterative reconstruction technique. COMPARISON:  None Available. FINDINGS: Alignment: Straightening of normal lordosis. Trace anterolisthesis of C3 on C4. No traumatic subluxation. Skull base and vertebrae: No acute  fracture. Vertebral body heights are maintained. The dens and skull base are intact. Soft tissues and spinal canal: No canal hematoma. Pharyngeal fluid may be related to intubation. There is patchy soft tissue gas within the left parapharyngeal and submandibular space with ill-defined fat planes. Disc levels: Mild diffuse degenerative disc disease. Mild multilevel facet hypertrophy. Upper chest: Assessed on concurrent chest CT, reported separately. Other: Carotid calcifications.  Patient is intubated. IMPRESSION: 1. No acute fracture or traumatic subluxation of the cervical spine. 2. Patchy soft tissue gas within the left parapharyngeal and submandibular space with ill-defined fat planes. This is of uncertain etiology, recommend correlation for laceration. No evidence of facial bone fracture to account for soft tissue gas. If there is clinical concern for infection, consider further assessment with soft tissue neck CT. Electronically Signed   By: Andrea Gasman M.D.   On: 07/19/2024 20:04   CT Angio Chest/Abd/Pel for Dissection W and/or Wo Contrast Result Date: 07/19/2024 EXAM: CTA CHEST, ABDOMEN AND PELVIS WITHOUT AND WITH CONTRAST 07/19/2024 07:45:09 PM TECHNIQUE: CTA of the chest was performed without and with the administration of 100 mL of intravenous iohexol  (OMNIPAQUE ) 350 MG/ML injection. CTA of the abdomen and pelvis was performed with the administration of 100 mL of intravenous iohexol  (OMNIPAQUE ) 350 MG/ML injection. Multiplanar reformatted images are provided for review. MIP images are provided for review. Automated exposure control, iterative reconstruction, and/or weight based adjustment of the mA/kV was utilized to reduce the radiation dose to as low as reasonably achievable. COMPARISON: Same day x-ray. CLINICAL HISTORY: Acute aortic syndrome (AAS) suspected. Unresponsiveness. Bradycardia. FINDINGS: VASCULATURE: AORTA: No acute aortic syndrome. Coronary artery and aortic atherosclerotic  calcifications. No abdominal aortic aneurysm. No dissection. Reflux of contrast into the IVC and hepatic veins compatible with elevated right heart pressures. PULMONARY ARTERIES: Negative for pulmonary embolism. Dilated main pulmonary artery measuring 34 mm which can be seen with pulmonary hypertension. GREAT VESSELS OF AORTIC ARCH: No acute finding. No dissection. No arterial occlusion or significant stenosis. CELIAC TRUNK: No acute finding. No occlusion or significant stenosis. SUPERIOR MESENTERIC ARTERY: No acute finding. No occlusion or significant stenosis. INFERIOR MESENTERIC ARTERY: No acute finding. No occlusion or significant stenosis. RENAL ARTERIES: No acute finding. No occlusion or significant stenosis. ILIAC ARTERIES: No acute finding. No occlusion or significant stenosis. CHEST: MEDIASTINUM: Cardiomegaly. No mediastinal lymphadenopathy. No pericardial effusion. LUNGS AND PLEURA: Endotracheal tube tip at the origin of the right mainstem bronchus at the level of the carina. Recommend retraction. Bronchovascular consolidation in the left lower lobe suspicious for aspiration or pneumonia. Scarring and atelectasis in the right lower lobe. No evidence of pleural effusion or pneumothorax. THORACIC BONES AND SOFT TISSUES: Body wall anasarca. No acute bone or soft tissue abnormality. ABDOMEN AND PELVIS: LIVER: No acute. GALLBLADDER AND BILE DUCTS: Gallbladder is unremarkable. No biliary ductal dilatation. SPLEEN: The spleen is unremarkable. PANCREAS: The pancreas is unremarkable. ADRENAL GLANDS: Bilateral adrenal glands demonstrate no acute abnormality. KIDNEYS, URETERS AND BLADDER: No stones in the kidneys or ureters. No hydronephrosis. No perinephric or periureteral stranding. Diffuse bladder wall thickening with perivesical stranding and fluid.  Correlate for cystitis. GI AND BOWEL: Colonic diverticulosis without evidence of diverticulitis. Normal appendix. Stomach and duodenal sweep demonstrate no acute  abnormality. There is no bowel obstruction. No abnormal bowel wall thickening or distension. REPRODUCTIVE: Reproductive organs are unremarkable. PERITONEUM AND RETROPERITONEUM: Small volume abdominal or pelvic ascites. No free air. LYMPH NODES: No lymphadenopathy. ABDOMINAL BONES AND SOFT TISSUES: Body wall anasarca. No acute abnormality of the bones. No acute soft tissue abnormality. IMPRESSION: 1. Endotracheal tube tip at the origin of the right mainstem bronchus at the level of the carina. Recommend retraction. 2. No acute aortic syndrome or pulmonary embolism . 3. Cardiomegaly with signs heart failure and of 3rd spacing of fluid including body wall anasarca, abdominal pelvic ascites, and reflux with contrast into the IVC and hepatic veins. 4. Dilated main pulmonary artery measuring 34 mm which can be seen with pulmonary hypertension. 5. Bronchovascular consolidation in the left lower lobe suspicious for aspiration or pneumonia. 6. Diffuse bladder wall thickening with perivesical stranding and fluid. Correlate for cystitis. Electronically signed by: Norman Gatlin MD 07/19/2024 08:04 PM EST RP Workstation: [id number]   CT Head Wo Contrast Result Date: 07/19/2024 CLINICAL DATA:  Provided history: Head trauma, minor (Age >= 65y) EXAM: CT HEAD WITHOUT CONTRAST TECHNIQUE: Contiguous axial images were obtained from the base of the skull through the vertex without intravenous contrast. RADIATION DOSE REDUCTION: This exam was performed according to the departmental dose-optimization program which includes automated exposure control, adjustment of the mA and/or kV according to patient size and/or use of iterative reconstruction technique. COMPARISON:  None Available. FINDINGS: Brain: No intracranial hemorrhage, mass effect, or midline shift. Brain volume is normal for age. No hydrocephalus. The basilar cisterns are patent. Periventricular and deep white matter hypodensity, nonspecific but typical of chronic small  vessel ischemia. Faint symmetric basal gangliar mineralization. No evidence of territorial infarct or acute ischemia. No extra-axial or intracranial fluid collection. Vascular: Atherosclerosis of skullbase vasculature without hyperdense vessel or abnormal calcification. Skull: No fracture or focal lesion. Sinuses/Orbits: Small mucous retention cyst in the right maxillary sinus. Bilateral cataract resection Other: None. IMPRESSION: 1. No acute intracranial abnormality. No skull fracture. 2. Chronic small vessel ischemia. Electronically Signed   By: Andrea Gasman M.D.   On: 07/19/2024 19:58   DG Chest Portable 1 View Result Date: 07/19/2024 CLINICAL DATA:  Chest pain. EXAM: PORTABLE CHEST 1 VIEW COMPARISON:  Chest radiograph dated 08/09/2023. FINDINGS: Endotracheal tube approximately 2 cm above the carina. Shallow inspiration. There is cardiomegaly. No focal consolidation, pleural effusion, pneumothorax. No acute osseous pathology. IMPRESSION: 1. Endotracheal tube above the carina. 2. Cardiomegaly. Electronically Signed   By: Vanetta Chou M.D.   On: 07/19/2024 19:17     Assessment and Plan:  Syncope/collapse Bradycardia Records reports she was walking up the stairs, became short of breath and collapsed.  Family had reported that patient self-reported episode of syncope approximately 1 month ago although details are unclear. EKG/telemetry not very revealing and not clearly related to her episode.  She has first-degree AV block with a PR 202, right bundle branch block but without any evidence of high-grade AV blocks on telemetry.  Occasional nocturnal/early morning pauses are noted around 2.3 seconds but given nocturnal/early morning pattern seem more consistent with OSA related pauses.  Her degree of bradycardia here which has been around 45-60 seems disproportionate to her current presentation.  EMS had reported heart rates as low as 20 but not verifiable.  Not on BB prior to admission  Consider  live monitor  at DC. TSH normal. Echocardiogram pending.  Chronic combined HFrEF Pulmonary HTN  -09/2023 EF 35 to 40%, moderately reduced RV.  RHC with severely elevated filling pressures, primarily group 2 pulmonary hypertension. Unclear ischemic/nonischemic, has not had left heart catheterization secondary to CKD.  Volume status difficult to assess accurately however has had significant weight gain comparatively to previous office visit of almost 40 pounds, volume up with significant peripheral edema with very elevated proBNP 23,000+.  Wondering if RV failure causing lack of forward flow could have contributed to her episode here? Would recommend starting IV lasix  120mg  BID.  However may need nephrology's assistance with this degree of CKD and would like to get formal read back from echocardiogram to further assess biventricular failure.  Will discuss with MD. GDMT limited by underlying renal disease. Currently trying to extubate sometime today, GDMT at home: Hydralazine  100 mg 3 times daily, Imdur  120 mg.  Would resume when able to take p.o. safely. Eventually needs an ischemic evaluation, NM/PET maybe but body habitus also limiting.  Checking echocardiogram, obtain lactic acid.  On arrival was 5.1. Will ensure its clearing.   OSA Family reports compliancy with CPAP.  Morbid obesity and OSA likely contributing to pulm hypertension.  CKD Followed by nephrology outpatient. May need nephro help with diuresis.   Acute hypoxic respiratory failure Felt secondary to pneumonia, currently intubated with plans of extubation possibly today.  Prophylactically treating for pneumonia.  Continue management per critical care.   Risk Assessment/Risk Scores:   [place]  Heart Association (NYHA) Functional Class NYHA Class III  For questions or updates, please contact [facility] Please consult [email or web address] for contact info under   Signed, Thom LITTIE Sluder, PA-C  07/20/2024 10:37 AM   Agree  with above.  63 y/o women with morbid obesity, severe OSA on CPAP, CKD. Recent RHC with severe pulmonary venous HTN  Has been gaining fluid recently and severely SOB. Very weak and has had several falls. Today family was helping her up the steps and she got very SOB and had apparent syncopal episode. EMS summoned. Patient apparently bradycardc at the time. Intubated in field and brought to [facility].   Lactate 5.1 on arrival. Now extubated and LA has cleared  Echo EF 60% RV dilated with moderately reduced function  ECG sinus brady 50 1AVB Personally reviewed  Tele sinus brady occasional PVCs brief NSVT   General:  Weak appearing obese woman Sitting up in bed. No resp difficulty HEENT: normal Neck: supple. JVP to ear Cor: Regular rate & rhythm. 2/6 TR Lungs: clear Abdomen: obese soft, nontender, nondistended.Good bowel sounds. Extremities: no cyanosis, clubbing, rash, 3+ edema Neuro: alert & orientedx3, cranial nerves grossly intact. moves all 4 extremities w/o difficulty. 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 Suspect episodes likely due to hypoxia (PEA type event) but does have some underlying conduction disease.   Will start diuresis. Follow closely on monitor. Will need outpatient monitor to continue to follow after d/c,   CRITICAL CARE Performed by: Cherrie Sieving  Total critical care time: 55 minutes  Critical care time was exclusive of separately billable procedures and treating other patients.  Critical care was necessary to treat or prevent imminent or life-threatening deterioration.  Critical care was time spent personally by me (independent of midlevel providers or residents) on the following activities: development of treatment plan with patient and/or surrogate as well as nursing, discussions with consultants, evaluation of patient's response  to treatment, examination of patient, obtaining history from patient or  surrogate, ordering and performing treatments and interventions, ordering and review of laboratory studies, ordering and review of radiographic studies, pulse oximetry and re-evaluation of patient's condition.  Toribio Fuel, MD  4:19 PM     [1]  Allergies Allergen Reactions   Latex Itching and Rash   Lisinopril Other (See Comments)    Headache   "

## 2024-07-20 NOTE — Progress Notes (Addendum)
 eLink Physician-Brief Progress Note Patient Name: Lori Mclaughlin DOB: Nov 20, 1960 MRN: [id number]   Date of Service  07/20/2024  HPI/Events of Note  pt's rhythm changed, looks junctional at times. Getting an EKG, she was asking if you wanted labs, and she has not had any urine output since they started her on the Milrinone  and lasix  gtts.   eICU Interventions    EKG from AM sinus brady, RBBB, qtc 477.  Cardiology notes reviewed. EF 35% , Group 2 PH.  Get a Stat BMP, mag, po4 ordered Follow EKG      Intervention Category Intermediate Interventions: Arrhythmia - evaluation and management  [name] 07/20/2024, 9:32 PM  2146 EKG reviewed. I do see a P wave, inverted, could be sinus brady vs junctional. RBBB. Diffuse T down.  LA normalized. - get troponin level along with lytes.   2204 bladder scan is 27 ml. UO 15ml. when HR drops, pt stares off at nothing. lab tried to get labs but she is really swollen, so they could not get anything.  Camera: On nasal o2.  RR is fine HR 73, sats 98%. 141/71. Lasix  drip since 1920 -Lasix  IV once ordered.  -cardiology notes reviewed.  Combined CHF. Earlier potassium was > 4. Mag was 2.  - 1 gm magnesium  sulfate ordered  2249 1.) Patient is intubated- asking for CBG every 4 hours  2.) Diet order change to NPO  2250 2nd lab person came and could not get anything. there is only 2 of them in the hospital so we might need a more invasive line - discussed with RN. Get a IV team for 3 rd PIV for blood draws.   2311 IV team said due to low GFR, they can not get IV line for blood draws. Not on pressors. - will discuss with IV team if they have access to ultrasound guided mid line or IV site for blood draws. If not, will need a central line from ground team.   23:30 Ground team/ccm notified for a line for blood draws.   0114 troponin 519, first draw, from admit just had pro BNP level, that was elevated.   cret 3.7,-  worsening phos 5.1. GFR 13 ABG from 25 th AM- partially compensated metabolic acidosis. - trend troponin level. Get EKG also. Nola resolved on my camera evaluation earlier.   0306 EKG normal sinus RBBB, qtc 500. Non specific T down in all leads. Similar to earlier EKG. -trend troponin

## 2024-07-20 NOTE — Progress Notes (Signed)
" °   07/20/24 2310  BiPAP/CPAP/SIPAP  $ Non-Invasive Ventilator  Non-Invasive Vent Initial  $ Face Mask Large  Yes  BiPAP/CPAP/SIPAP Pt Type Adult  BiPAP/CPAP/SIPAP SERVO  Mask Type Full face mask  Dentures removed? Not applicable  Mask Size Large  Set Rate 18 breaths/min  Respiratory Rate 20 breaths/min  IPAP 10 cmH20  EPAP 5 cmH2O  PEEP 5 cmH20  FiO2 (%) 40 %  Minute Ventilation 12.1  Leak 31  Peak Inspiratory Pressure (PIP) 10  Tidal Volume (Vt) 469  Patient Home Machine No  Patient Home Mask No  Patient Home Tubing No  Auto Titrate No  Press High Alarm 25 cmH2O  CPAP/SIPAP surface wiped down Yes  Device Plugged into RED Power Outlet Yes  BiPAP/CPAP /SiPAP Vitals  Temp 98.2 F (36.8 C)  Pulse Rate 72  Resp 15  SpO2 97 %  MEWS Score/Color  MEWS Score 0  MEWS Score Color Green    "

## 2024-07-20 NOTE — Progress Notes (Signed)
 Initial Nutrition Assessment  DOCUMENTATION CODES:  Morbid obesity  INTERVENTION:  If the patient remains intubated, begin Vital AF 1.2 at 10 mL/hr via OGT. Increase by 10 mL/hr every 6 hours as tolerated to 60 mL/hr goal. Water  flushes per CCM. This enteral nutrition regimen provides 1440 mL formula, 1728 Kcals, 108 g protein, 159 g carbohydrates, and 1168 mL free water  daily, which meets 100% of the patient's estimated nutrition needs. Swallow eval if extubated and advance diet as tolerated to regular diet. Would not restrict the patient's diet at this time so as to encourage PO intake. Glucerna Shake PO TID when diet advances. Each supplement provides 220 Kcals and 10 grams of protein.  NUTRITION DIAGNOSIS:  Swallowing difficulty related to inability to eat (intubated) as evidenced by  (need for enteral nutrition to meet needs)   GOAL:  Patient will meet greater than or equal to 90% of their needs   MONITOR:  Vent status, Labs, Weight trends, TF tolerance, I & O's  REASON FOR ASSESSMENT:  Consult Enteral/tube feeding initiation and management  ASSESSMENT:  Patient presented with unresponsiveness after experiencing shortness of breath while being pushed up the stairs by family and was found to have PNA and CHF. PMH significant for DM2, severe OSA, morbid obesity, CKD, HTN, dyslipidemia.  12/24 intubated 12/25 remains intubated, vented, sedated  The patient is currently off Propofol . 16 Fr OGT present - imaging 12/24 confirms tip in midbody of stomach. Discussed with ICU attending - there is a possibility of extubation today so hold off on TF.  Scheduled Meds:  Chlorhexidine  Gluconate Cloth  6 each Topical Daily   famotidine   20 mg Per Tube BID   fentaNYL  (SUBLIMAZE ) injection  25-50 mcg Intravenous Once   heparin   5,000 Units Subcutaneous Q8H   insulin  aspart  0-9 Units Subcutaneous Q4H   mouth rinse  15 mL Mouth Rinse Q2H   Continuous Infusions:  azithromycin  Stopped  (07/20/24 0026)   cefTRIAXone  (ROCEPHIN )  IV Stopped (07/19/24 2308)   fentaNYL  infusion INTRAVENOUS 75 mcg/hr (07/20/24 0900)   propofol  (DIPRIVAN ) infusion Stopped (07/20/24 0423)   PRN Meds:.artificial tears, atropine , fentaNYL , ondansetron  (ZOFRAN ) IV, mouth rinse  Diet Order             Diet NPO time specified  Diet effective now                  Meal Intake: N/A  Labs:     Latest Ref Rng & Units 07/20/2024    3:53 AM 07/20/2024    3:05 AM 07/19/2024   10:39 PM  CMP  Glucose 70 - 99 mg/dL  794    BUN 8 - 23 mg/dL  46    Creatinine 9.55 - 1.00 mg/dL  6.99  7.04   Sodium 864 - 145 mmol/L 143  141    Potassium 3.5 - 5.1 mmol/L 4.1  4.1    Chloride 98 - 111 mmol/L  109    CO2 22 - 32 mmol/L  21    Calcium  8.9 - 10.3 mg/dL  8.4      I/O: +499 mL since admit  NUTRITION - FOCUSED PHYSICAL EXAM: Deferred due to remote RD coverage  EDUCATION NEEDS:  Not appropriate for education at this time  Skin:  Skin Assessment: Reviewed RN Assessment  Last BM:  PTA  Height:  Ht Readings from Last 1 Encounters:  07/19/24 5' (1.524 m)   Weight:  Wt Readings from Last 10 Encounters:  07/20/24 132.9 kg  05/08/24 114.3 kg  02/18/24 116.1 kg  11/08/23 117.2 kg  10/25/23 113.9 kg  10/11/23 119.3 kg  10/05/23 117.8 kg  09/27/23 119.7 kg  09/07/23 124.3 kg  12/10/22 114.3 kg   Weight Change: relatively stable weight the past year  Usual Body Weight: unable to determine  Edema: +2 generalized; patient noted with +3 BLE edema yesterday 12/24  Ideal Body Weight:  48 kg   BMI:  Body mass index is 57.22 kg/m.  Minute Volume: 7.9 L Temperature Max (past 12 hours): 36.7 C Propofol  rate: N/A   Estimated Daily Nutritional Needs:  Kcal:  1600-1800 Protein:  100-120 g Fluid:  >/=1600 or CCM mL    Lori Ruth, MS, RDN, LDN [place]. [facility] See AMION for contact information Secure chat preferred

## 2024-07-20 NOTE — TOC Initial Note (Signed)
 Transition of Care [facility]) - Initial/Assessment Note    Patient Details  Name: Lori Mclaughlin MRN: [id number] Date of Birth: 06-14-1961  Transition of Care [facility] CM/SW Contact:    Lori Mclaughlin, LCSWA Phone Number: 07/20/2024, 2:36 PM  Clinical Narrative:                  2:36 PM CSW introduced self and role to patient's daughter, Lori (per chart review, patient was intubated with feeding tube at time of call, 14:12). Lori Mclaughlin confirmed patient resides with her, her fiance, and their three young children. Lori Mclaughlin stated she and her sister drive patient to appointments. Lori Mclaughlin confirmed patient does not have SNF or HH history. Lori Mclaughlin stated patient uses DME (CPAP, RW, cane) at home (per chart review, DME history with [facility]). Lori Mclaughlin confirmed patient's PCP and insurance coverage. No TOC needs at this time. TOC will continue to follow.  Expected Discharge Plan: Home/Self Care Barriers to Discharge: Continued Medical Work up   Patient Goals and CMS Choice            Expected Discharge Plan and Services       Living arrangements for the past 2 months: Single Family Home                                      Prior Living Arrangements/Services Living arrangements for the past 2 months: Single Family Home Lives with:: Relatives, Adult Children Patient language and need for interpreter reviewed:: Yes        Need for Family Participation in Patient Care: Yes (Comment) Care giver support system in place?: Yes (comment) Current home services: DME Criminal Activity/Legal Involvement Pertinent to Current Situation/Hospitalization: No - Comment as needed  Activities of Daily Living      Permission Sought/Granted Permission sought to share information with : Family Supports Permission granted to share information with : No (Contact information on chart)  Share Information with NAME: Lori Mclaughlin     Permission granted to share info w Relationship:  Daughter  Permission granted to share info w Contact Information: [phone]  Emotional Assessment   Attitude/Demeanor/Rapport: Unable to Assess, Intubated (Following Commands or Not Following Commands) Affect (typically observed): Unable to Assess   Alcohol / Substance Use: Not Applicable Psych Involvement: No (comment)  Admission diagnosis:  Bradycardia [R00.1] Acute on chronic heart failure, unspecified heart failure type (HCC) [I50.9] Acute hypoxemic respiratory failure (HCC) [J96.01] Acute hypoxic respiratory failure (HCC) [J96.01] Patient Active Problem List   Diagnosis Date Noted   Acute hypoxic respiratory failure (HCC) 07/19/2024   Type 2 diabetes mellitus with hyperglycemia, with long-term current use of insulin  (HCC) 03/01/2024   Acute on chronic HFrEF (heart failure with reduced ejection fraction) (HCC) 10/05/2023   Type 2 diabetes mellitus with complication, with long-term current use of insulin  (HCC) 10/05/2023   Chronic kidney disease (CKD), stage IV (severe) (HCC) 10/05/2023   Microcytic anemia 10/05/2023   AKI (acute kidney injury) 09/28/2023   CHF (congestive heart failure) (HCC) 09/27/2023   Liver lesion 07/29/2022   Elevated LFTs 07/29/2022   Diabetes (HCC) 05/10/2021   Hypertensive emergency 03/08/2021   Chronic kidney disease (CKD) stage G3b/A1, moderately decreased glomerular filtration rate (GFR) between 30-44 mL/min/1.73 square meter and albuminuria creatinine ratio less than 30 mg/g (HCC) 03/08/2021   Thiamine deficiency 09/19/2017   Myalgia 03/10/2017   Screen for colon cancer 06/12/2015   Visit  for screening mammogram 06/12/2015   Hypersomnia 04/11/2014   Morbid obesity (HCC) 04/11/2014   Bradycardia, drug induced 04/11/2014   Chronic diastolic CHF (congestive heart failure), NYHA class 2 (HCC) 03/30/2014   Low back pain radiating to left leg 01/26/2012   Sleep apnea 11/13/2011   Hyperlipidemia with target LDL less than 70 01/14/2011   Deficiency  anemia 08/05/2009   Essential hypertension 08/05/2009   ALLERGIC RHINITIS 08/05/2009   GERD 08/05/2009   OSTEOARTHRITIS 08/05/2009   PCP:  Almarie Waddell NOVAK, NP Pharmacy:   [facility] [place] [facility] [address], [address] [place] [address] Phone: [phone] Fax: [phone]     Social Drivers of Health (SDOH) Social History: SDOH Screenings   Food Insecurity: Patient Unable To Answer (07/20/2024)  Housing: Patient Unable To Answer (07/20/2024)  Transportation Needs: Patient Unable To Answer (07/20/2024)  Utilities: Patient Unable To Answer (07/20/2024)  Alcohol Screen: Low Risk (09/30/2023)  Depression (PHQ2-9): Low Risk (05/08/2024)  Financial Resource Strain: Low Risk (05/07/2024)  Physical Activity: Inactive (05/07/2024)  Social Connections: Patient Unable To Answer (07/20/2024)  Recent Concern: Social Connections - Moderately Isolated (05/07/2024)  Stress: No Stress Concern Present (05/22/2024)   Received from [facility]  Tobacco Use: Medium Risk (07/19/2024)   SDOH Interventions:     Readmission Risk Interventions     No data to display

## 2024-07-20 NOTE — Progress Notes (Signed)
" °  Not diuresing with 120 IV lasix .   Start milrinone  and lasix  gtt.   Toribio Fuel, MD  7:51 PM  "

## 2024-07-20 NOTE — Progress Notes (Signed)
 "  NAME:  Lori Mclaughlin, MRN:  [id number], DOB:  [birth year], LOS: 1 ADMISSION DATE:  07/19/2024, CONSULTATION DATE:  07/19/2024 REFERRING MD:  Ozell Marine, DO, CHIEF COMPLAINT:  SOB  History of Present Illness:  63 y/o female with PMH for DMT2 on insulin , CKD,  HTN, Severe OSA, Morbid Obesity and HLD who presented from home after collapsing at the top of the stairs.  Apparently she was being pushed up the stairs by the family and became SOB.  She apparently collapesd and became unresponsive.  EMS arrived to find her hypertensive and bradycardiac as low as HR 20s.  She was intubated with 5mg  Versed .  In the ED she was following commands neurologically.  Labs show LA 5.1, Cr 3, BNP 23k,  WBC 7.4.  CT chest showing posterior consolidations, pneumonia vs dependent atelectasis.  Pertinent  Medical History  DMT2 on insulin , HTN, Severe OSA, Morbid Obesity and HLD  Significant Hospital Events: Including procedures, antibiotic start and stop dates in addition to other pertinent events   12/24: admit to ICU  Interim History / Subjective:  Overnight reported HR 20s. Atropine  ordered however unclear if given based on MAR Remains on vent FIO2 50% PEEP 5 Opens eyes to voice and wiggles feet to command RT pulled back ETT overnight for right mainstem placement  Objective    Blood pressure 103/66, pulse (!) 56, temperature 98.1 F (36.7 C), resp. rate (!) 8, height 5' (1.524 m), weight 132.9 kg, last menstrual period 02/23/2012, SpO2 100%.    Vent Mode: PRVC;SIMV;PSV FiO2 (%):  [40 %-100 %] 40 % Set Rate:  [15 bmp-18 bmp] 15 bmp Vt Set:  [360 mL] 360 mL PEEP:  [5 cmH20] 5 cmH20 Pressure Support:  [10 cmH20] 10 cmH20 Plateau Pressure:  [16 cmH20-19 cmH20] 19 cmH20   Intake/Output Summary (Last 24 hours) at 07/20/2024 0908 Last data filed at 07/20/2024 0600 Gross per 24 hour  Intake 740.97 ml  Output 260 ml  Net 480.97 ml   Filed Weights   07/20/24 0000 07/20/24 0500  Weight: 132.9  kg 132.9 kg   Physical Exam: General: Well-appearing, no acute distress HENT: [place], AT, ETT in place Eyes: EOMI, no scleral icterus Respiratory: Diminished but clear to auscultation bilaterally.  No crackles, wheezing or rales Cardiovascular: Bradycardic, RR, -M/R/G, no JVD GI: BS+, soft, nontender Extremities: Pedal edema, 2+,-tenderness Neuro: Opens eyes to voice, follows commands GU: Foley in place  Imaging, labs and test in EMR in the last 24 hours reviewed independently by me. Pertinent findings below: CXR 12/25 LLL consolidation. ETT 2 cm above carina  Resolved problem list   Assessment and Plan   Acute metabolic encephalopathy: Mild uremia could be contributing PAD protocol RASS -1 Re-orient as able  Acute hypoxemic respiratory failure secondary to pneumonia: neg covid/flu/RSV Severe OSA, unknown if on CPAP at home -Full vent support -LTVV, 4-8cc/kg IBW with goal Pplat<30 and DP<15 -VAP and PAD protocol: Fentanyl  for goal -1 -SBT/WUA when able. May need NIV post-extubation -Continue CAP: Ceftriaxone  and azithro  Bradycardia with altered mental status: remains bradycardic with reported episodes as low was 20s but hemodynamically stable. No significant BB/CCB on board. Amlodipine  however would not expect this level of hypotension even in setting of AKI. Patient reported unwitnessed LOC ~ month ago preceded by dizziness -Tele -Echo -PRN atropine  for HR <40 -Cardiology consulted -If hemodynamically stable will initiate pressors  Chronic combined heart failure EF 35-45% in 09/2023 PH Group II HTN -Holding antihypertensive agents.  -Cardiology consulted  Chronic anemia Thrombocytopenia: New, may be due to acute illness -Trend -Transfuse Hg >7 and Plt >10k  CKD IIIB - appears stable -Monitor UOP/Cr -Avoid nephrotoxic agents  DMII -SSI -Will likely need basal when diet started  Obtained history and updated daughter at bedside. Addressed questions and  concerns  Critical care time:    The patient is critically ill with multiple organ systems failure and requires high complexity decision making for assessment and support, frequent evaluation and titration of therapies, application of advanced monitoring technologies and extensive interpretation of multiple databases.  Independent Critical Care Time: 75 Minutes.   Slater Staff, M.D. [facility] Pulmonary/Critical Care Medicine 07/20/2024 9:08 AM   Please see Amion for pager number to reach on-call Pulmonary and Critical Care Team.         "

## 2024-07-21 ENCOUNTER — Inpatient Hospital Stay (HOSPITAL_COMMUNITY)

## 2024-07-21 DIAGNOSIS — J189 Pneumonia, unspecified organism: Secondary | ICD-10-CM | POA: Diagnosis not present

## 2024-07-21 DIAGNOSIS — N179 Acute kidney failure, unspecified: Secondary | ICD-10-CM

## 2024-07-21 DIAGNOSIS — G9341 Metabolic encephalopathy: Secondary | ICD-10-CM | POA: Diagnosis not present

## 2024-07-21 DIAGNOSIS — I509 Heart failure, unspecified: Secondary | ICD-10-CM

## 2024-07-21 DIAGNOSIS — J9601 Acute respiratory failure with hypoxia: Secondary | ICD-10-CM

## 2024-07-21 DIAGNOSIS — G4733 Obstructive sleep apnea (adult) (pediatric): Secondary | ICD-10-CM | POA: Diagnosis not present

## 2024-07-21 LAB — MAGNESIUM
Magnesium: 2.2 mg/dL (ref 1.7–2.4)
Magnesium: 2.2 mg/dL (ref 1.7–2.4)

## 2024-07-21 LAB — RENAL FUNCTION PANEL
Albumin: 3.7 g/dL (ref 3.5–5.0)
Anion gap: 24 — ABNORMAL HIGH (ref 5–15)
BUN: 55 mg/dL — ABNORMAL HIGH (ref 8–23)
CO2: 13 mmol/L — ABNORMAL LOW (ref 22–32)
Calcium: 9.5 mg/dL (ref 8.9–10.3)
Chloride: 104 mmol/L (ref 98–111)
Creatinine, Ser: 4.36 mg/dL — ABNORMAL HIGH (ref 0.44–1.00)
GFR, Estimated: 11 mL/min — ABNORMAL LOW
Glucose, Bld: 80 mg/dL (ref 70–99)
Phosphorus: 6.3 mg/dL — ABNORMAL HIGH (ref 2.5–4.6)
Potassium: 4.4 mmol/L (ref 3.5–5.1)
Sodium: 140 mmol/L (ref 135–145)

## 2024-07-21 LAB — CBC
HCT: 31 % — ABNORMAL LOW (ref 36.0–46.0)
Hemoglobin: 9.8 g/dL — ABNORMAL LOW (ref 12.0–15.0)
MCH: 22.6 pg — ABNORMAL LOW (ref 26.0–34.0)
MCHC: 31.6 g/dL (ref 30.0–36.0)
MCV: 71.6 fL — ABNORMAL LOW (ref 80.0–100.0)
Platelets: 173 K/uL (ref 150–400)
RBC: 4.33 MIL/uL (ref 3.87–5.11)
RDW: 22.4 % — ABNORMAL HIGH (ref 11.5–15.5)
WBC: 11.8 K/uL — ABNORMAL HIGH (ref 4.0–10.5)
nRBC: 0 % (ref 0.0–0.2)

## 2024-07-21 LAB — POCT I-STAT 7, (LYTES, BLD GAS, ICA,H+H)
Acid-base deficit: 19 mmol/L — ABNORMAL HIGH (ref 0.0–2.0)
Acid-base deficit: 21 mmol/L — ABNORMAL HIGH (ref 0.0–2.0)
Bicarbonate: 9.9 mmol/L — ABNORMAL LOW (ref 20.0–28.0)
Bicarbonate: 9.6 mmol/L — ABNORMAL LOW (ref 20.0–28.0)
Calcium, Ion: 1.11 mmol/L — ABNORMAL LOW (ref 1.15–1.40)
Calcium, Ion: 1.1 mmol/L — ABNORMAL LOW (ref 1.15–1.40)
HCT: 32 % — ABNORMAL LOW (ref 36.0–46.0)
HCT: 31 % — ABNORMAL LOW (ref 36.0–46.0)
Hemoglobin: 10.9 g/dL — ABNORMAL LOW (ref 12.0–15.0)
Hemoglobin: 10.5 g/dL — ABNORMAL LOW (ref 12.0–15.0)
O2 Saturation: 99 %
O2 Saturation: 98 %
Patient temperature: 97.2
Patient temperature: 36.4
Potassium: 4.2 mmol/L (ref 3.5–5.1)
Potassium: 3.9 mmol/L (ref 3.5–5.1)
Sodium: 141 mmol/L (ref 135–145)
Sodium: 139 mmol/L (ref 135–145)
TCO2: 11 mmol/L — ABNORMAL LOW (ref 22–32)
TCO2: 11 mmol/L — ABNORMAL LOW (ref 22–32)
pCO2 arterial: 31.6 mmHg — ABNORMAL LOW (ref 32–48)
pCO2 arterial: 37.6 mmHg (ref 32–48)
pH, Arterial: 7.101 — CL (ref 7.35–7.45)
pH, Arterial: 7.01 — CL (ref 7.35–7.45)
pO2, Arterial: 163 mmHg — ABNORMAL HIGH (ref 83–108)
pO2, Arterial: 148 mmHg — ABNORMAL HIGH (ref 83–108)

## 2024-07-21 LAB — GLUCOSE, CAPILLARY
Glucose-Capillary: 119 mg/dL — ABNORMAL HIGH (ref 70–99)
Glucose-Capillary: 127 mg/dL — ABNORMAL HIGH (ref 70–99)
Glucose-Capillary: 126 mg/dL — ABNORMAL HIGH (ref 70–99)
Glucose-Capillary: 79 mg/dL (ref 70–99)
Glucose-Capillary: 75 mg/dL (ref 70–99)
Glucose-Capillary: 65 mg/dL — ABNORMAL LOW (ref 70–99)

## 2024-07-21 LAB — BASIC METABOLIC PANEL WITH GFR
Anion gap: 15 (ref 5–15)
Anion gap: 15 (ref 5–15)
BUN: 51 mg/dL — ABNORMAL HIGH (ref 8–23)
BUN: 52 mg/dL — ABNORMAL HIGH (ref 8–23)
CO2: 20 mmol/L — ABNORMAL LOW (ref 22–32)
CO2: 20 mmol/L — ABNORMAL LOW (ref 22–32)
Calcium: 9.1 mg/dL (ref 8.9–10.3)
Calcium: 9.1 mg/dL (ref 8.9–10.3)
Chloride: 106 mmol/L (ref 98–111)
Chloride: 106 mmol/L (ref 98–111)
Creatinine, Ser: 3.73 mg/dL — ABNORMAL HIGH (ref 0.44–1.00)
Creatinine, Ser: 3.88 mg/dL — ABNORMAL HIGH (ref 0.44–1.00)
GFR, Estimated: 13 mL/min — ABNORMAL LOW
GFR, Estimated: 12 mL/min — ABNORMAL LOW
Glucose, Bld: 122 mg/dL — ABNORMAL HIGH (ref 70–99)
Glucose, Bld: 126 mg/dL — ABNORMAL HIGH (ref 70–99)
Potassium: 4.7 mmol/L (ref 3.5–5.1)
Potassium: 4.2 mmol/L (ref 3.5–5.1)
Sodium: 140 mmol/L (ref 135–145)
Sodium: 141 mmol/L (ref 135–145)

## 2024-07-21 LAB — HEPATIC FUNCTION PANEL
ALT: 24 U/L (ref 0–44)
AST: 38 U/L (ref 15–41)
Albumin: 3.6 g/dL (ref 3.5–5.0)
Alkaline Phosphatase: 206 U/L — ABNORMAL HIGH (ref 38–126)
Bilirubin, Direct: 0.6 mg/dL — ABNORMAL HIGH (ref 0.0–0.2)
Indirect Bilirubin: 0.3 mg/dL (ref 0.3–0.9)
Total Bilirubin: 0.9 mg/dL (ref 0.0–1.2)
Total Protein: 7.2 g/dL (ref 6.5–8.1)

## 2024-07-21 LAB — COOXEMETRY PANEL
Carboxyhemoglobin: 1.5 % (ref 0.5–1.5)
Methemoglobin: <0.7 % (ref 0.0–1.5)
O2 Saturation: 75.2 %
Total hemoglobin: 11.7 g/dL — ABNORMAL LOW (ref 12.0–16.0)

## 2024-07-21 LAB — LACTIC ACID, PLASMA
Lactic Acid, Venous: >9 mmol/L (ref 0.5–1.9)
Lactic Acid, Venous: >9 mmol/L (ref 0.5–1.9)

## 2024-07-21 LAB — TROPONIN T, HIGH SENSITIVITY
Troponin T High Sensitivity: 519 ng/L (ref 0–19)
Troponin T High Sensitivity: 530 ng/L (ref 0–19)

## 2024-07-21 LAB — PHOSPHORUS: Phosphorus: 5.1 mg/dL — ABNORMAL HIGH (ref 2.5–4.6)

## 2024-07-21 MED ORDER — ISOSORBIDE MONONITRATE ER 60 MG PO TB24
60.0000 mg | ORAL_TABLET | Freq: Every day | ORAL | Status: DC
  Administered 2024-07-21: 60 mg via ORAL
  Filled 2024-07-21: qty 1

## 2024-07-21 MED ORDER — AMIODARONE HCL IN DEXTROSE 360-4.14 MG/200ML-% IV SOLN
INTRAVENOUS | Status: AC
  Filled 2024-07-21: qty 200

## 2024-07-21 MED ORDER — METOLAZONE 2.5 MG PO TABS
5.0000 mg | ORAL_TABLET | Freq: Once | ORAL | Status: AC
  Administered 2024-07-21: 5 mg via ORAL
  Filled 2024-07-21: qty 2

## 2024-07-21 MED ORDER — ROCURONIUM BROMIDE 10 MG/ML (PF) SYRINGE
PREFILLED_SYRINGE | INTRAVENOUS | Status: AC
  Administered 2024-07-21: 50 mg via INTRAVENOUS
  Filled 2024-07-21: qty 10

## 2024-07-21 MED ORDER — ORAL CARE MOUTH RINSE
15.0000 mL | OROMUCOSAL | Status: DC
  Administered 2024-07-22: 15 mL via OROMUCOSAL

## 2024-07-21 MED ORDER — EPINEPHRINE HCL 5 MG/250ML IV SOLN IN NS
INTRAVENOUS | Status: AC
  Filled 2024-07-21: qty 250

## 2024-07-21 MED ORDER — ORAL CARE MOUTH RINSE: 15.0000 mL | OROMUCOSAL | Status: DC | PRN

## 2024-07-21 MED ORDER — AMIODARONE HCL IN DEXTROSE 360-4.14 MG/200ML-% IV SOLN
30.0000 mg/h | INTRAVENOUS | Status: DC
  Administered 2024-07-21: 30 mg/h via INTRAVENOUS

## 2024-07-21 MED ORDER — ETOMIDATE 2 MG/ML IV SOLN: 20.0000 mg | Freq: Once | INTRAVENOUS | Status: AC

## 2024-07-21 MED ORDER — HYDRALAZINE HCL 50 MG PO TABS: 50.0000 mg | ORAL_TABLET | Freq: Three times a day (TID) | ORAL | Status: DC

## 2024-07-21 MED ORDER — ORAL CARE MOUTH RINSE: 15.0000 mL | OROMUCOSAL | Status: DC

## 2024-07-21 MED ORDER — PHENYLEPHRINE 80 MCG/ML (10ML) SYRINGE FOR IV PUSH (FOR BLOOD PRESSURE SUPPORT): 80.0000 ug | PREFILLED_SYRINGE | Freq: Once | INTRAVENOUS | Status: AC | PRN

## 2024-07-21 MED ORDER — ETOMIDATE 2 MG/ML IV SOLN
INTRAVENOUS | Status: AC
  Administered 2024-07-21: 20 mg via INTRAVENOUS
  Filled 2024-07-21: qty 20

## 2024-07-21 MED ORDER — AMIODARONE IV BOLUS ONLY 150 MG/100ML
INTRAVENOUS | Status: AC
  Filled 2024-07-21: qty 100

## 2024-07-21 MED ORDER — STERILE WATER FOR INJECTION IV SOLN
INTRAVENOUS | Status: DC
  Filled 2024-07-21 (×2): qty 1000

## 2024-07-21 MED ORDER — SODIUM BICARBONATE 8.4 % IV SOLN
100.0000 meq | Freq: Once | INTRAVENOUS | Status: AC
  Administered 2024-07-21: 100 meq via INTRAVENOUS

## 2024-07-21 MED ORDER — MIDAZOLAM HCL 2 MG/2ML IJ SOLN
INTRAMUSCULAR | Status: AC
  Administered 2024-07-21: 2 mg via INTRAVENOUS
  Filled 2024-07-21: qty 2

## 2024-07-21 MED ORDER — PRISMASOL BGK 4/2.5 32-4-2.5 MEQ/L EC SOLN: Status: DC

## 2024-07-21 MED ORDER — ROCURONIUM BROMIDE 10 MG/ML (PF) SYRINGE: 50.0000 mg | PREFILLED_SYRINGE | Freq: Once | INTRAVENOUS | Status: AC

## 2024-07-21 MED ORDER — FENTANYL CITRATE (PF) 50 MCG/ML IJ SOSY
PREFILLED_SYRINGE | INTRAMUSCULAR | Status: AC
  Filled 2024-07-21: qty 2

## 2024-07-21 MED ORDER — SODIUM CHLORIDE 0.9 % FOR CRRT: INTRAVENOUS_CENTRAL | Status: DC | PRN

## 2024-07-21 MED ORDER — SODIUM CHLORIDE 0.9 % IV SOLN: INTRAVENOUS | Status: DC | PRN

## 2024-07-21 MED ORDER — PHENYLEPHRINE 80 MCG/ML (10ML) SYRINGE FOR IV PUSH (FOR BLOOD PRESSURE SUPPORT)
PREFILLED_SYRINGE | INTRAVENOUS | Status: AC
  Administered 2024-07-21: 160 ug via INTRAVENOUS
  Filled 2024-07-21: qty 10

## 2024-07-21 MED ORDER — AMIODARONE IV BOLUS ONLY 150 MG/100ML
150.0000 mg | Freq: Once | INTRAVENOUS | Status: AC
  Administered 2024-07-21: 150 mg via INTRAVENOUS

## 2024-07-21 MED ORDER — NOREPINEPHRINE 16 MG/250ML-% IV SOLN
0.0000 ug/min | INTRAVENOUS | Status: DC
  Administered 2024-07-21: 28 ug/min via INTRAVENOUS
  Administered 2024-07-21: 10 ug/min via INTRAVENOUS
  Filled 2024-07-21 (×2): qty 250

## 2024-07-21 MED ORDER — EPINEPHRINE HCL 5 MG/250ML IV SOLN IN NS
0.5000 ug/min | INTRAVENOUS | Status: DC
  Administered 2024-07-21: 0.5 ug/min via INTRAVENOUS

## 2024-07-21 MED ORDER — SODIUM BICARBONATE 8.4 % IV SOLN
50.0000 meq | Freq: Once | INTRAVENOUS | Status: AC
  Administered 2024-07-21: 50 meq via INTRAVENOUS
  Filled 2024-07-21: qty 50

## 2024-07-21 MED ORDER — VASOPRESSIN 20 UNITS/100 ML INFUSION FOR SHOCK
0.0000 [IU]/min | INTRAVENOUS | Status: DC
  Administered 2024-07-21: 0.03 [IU]/min via INTRAVENOUS
  Filled 2024-07-21: qty 100

## 2024-07-21 MED ORDER — NOREPINEPHRINE 4 MG/250ML-% IV SOLN
0.0000 ug/min | INTRAVENOUS | Status: DC
  Administered 2024-07-21: 2 ug/min via INTRAVENOUS
  Filled 2024-07-21: qty 250

## 2024-07-21 MED ORDER — DEXTROSE 50 % IV SOLN
INTRAVENOUS | Status: AC
  Administered 2024-07-21: 25 mL
  Filled 2024-07-21: qty 50

## 2024-07-21 MED ORDER — HEPARIN SODIUM (PORCINE) 1000 UNIT/ML DIALYSIS: 1000.0000 [IU] | INTRAMUSCULAR | Status: DC | PRN

## 2024-07-21 MED ORDER — SODIUM BICARBONATE 8.4 % IV SOLN
INTRAVENOUS | Status: AC
  Filled 2024-07-21: qty 100

## 2024-07-21 MED ORDER — SODIUM CHLORIDE 0.9 % IV SOLN: 250.0000 mL | INTRAVENOUS | Status: DC

## 2024-07-21 MED ORDER — ENSURE PLUS HIGH PROTEIN PO LIQD: 237.0000 mL | Freq: Two times a day (BID) | ORAL | Status: DC

## 2024-07-21 MED ORDER — VANCOMYCIN VARIABLE DOSE PER UNSTABLE RENAL FUNCTION (PHARMACIST DOSING): Status: DC

## 2024-07-21 MED ORDER — SODIUM CHLORIDE 0.9 % IV SOLN
2.0000 g | Freq: Three times a day (TID) | INTRAVENOUS | Status: DC
  Filled 2024-07-21: qty 12.5

## 2024-07-21 MED ORDER — AMIODARONE HCL IN DEXTROSE 360-4.14 MG/200ML-% IV SOLN
60.0000 mg/h | INTRAVENOUS | Status: AC
  Administered 2024-07-21 (×2): 60 mg/h via INTRAVENOUS
  Filled 2024-07-21: qty 200

## 2024-07-21 MED ORDER — FUROSEMIDE 10 MG/ML IJ SOLN
80.0000 mg | Freq: Once | INTRAMUSCULAR | Status: AC
  Administered 2024-07-21: 80 mg via INTRAVENOUS
  Filled 2024-07-21: qty 8

## 2024-07-21 MED ORDER — VANCOMYCIN HCL 2000 MG/400ML IV SOLN
2000.0000 mg | Freq: Once | INTRAVENOUS | Status: AC
  Administered 2024-07-21: 2000 mg via INTRAVENOUS
  Filled 2024-07-21: qty 400

## 2024-07-21 MED ORDER — MIDAZOLAM HCL (PF) 2 MG/2ML IJ SOLN: 2.0000 mg | Freq: Once | INTRAMUSCULAR | Status: AC

## 2024-07-21 NOTE — Procedures (Signed)
 Intubation Procedure Note  Lori Mclaughlin  [id number]  1961-01-07  Date:07/21/2024  Time:10:37 PM   Provider Performing:Sohana Austell Maree    Procedure: Intubation (31500)  Indication(s) Respiratory Failure  Consent Risks of the procedure as well as the alternatives and risks of each were explained to the patient and/or caregiver.  Consent for the procedure was obtained and is signed in the bedside chart   Anesthesia Etomidate , Versed , and Rocuronium    Time Out Verified patient identification, verified procedure, site/side was marked, verified correct patient position, special equipment/implants available, medications/allergies/relevant history reviewed, required imaging and test results available.   Sterile Technique Usual hand hygeine, masks, and gloves were used   Procedure Description Patient positioned in bed supine.  Sedation given as noted above.  Patient was intubated with endotracheal tube using Glidescope.  View was Grade 2 only posterior commissure .  Number of attempts was 1.  Colorimetric CO2 detector was consistent with tracheal placement.   Complications/Tolerance None; patient tolerated the procedure well. Chest X-ray is ordered to verify placement.   EBL 0   Specimen(s) None

## 2024-07-21 NOTE — Consult Note (Signed)
 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 Patient did receive contrast for the CT scan on 12/24. -She was already CKD4 being followed by Dr. Jerrye - Hopefully she will respond to milrinone  plus IV Lasix ; I discussed that with the patient as she did not know she was nearing dialysis.  If poor response we will initiate CRRT to help offload the patient; I discussed with the patient, educating, counseling that sometimes when we initiate dialysis patients with advanced disease they do not come off dialysis.  She remained hopeful but realistic, expressing understanding.  -Monitor Daily I/Os, Daily weight  -Maintain MAP>65 for optimal renal perfusion.  - Avoid nephrotoxic agents such as IV contrast, NSAIDs, and phosphate containing bowel preps (FLEETS)  HFrEF previously followed by the advanced heart failure team failing high-dose Lasix  push transition to milrinone  as well as Lasix  drip.  Urine output has been very poor since being hospitalized. Severe pulmonary hypertension partly compliant with CPAP at home Hypertension -actually on the lower side at time of consultation PNA treated with antibiotics for community-acquired pneumonia  HPI: Lori Mclaughlin is an 63 y.o. female with a history of T2DM, hypertension, severe OSA, HFrEF (found to have HFrEF 35 to 40% in March 2025 with reduced RV function), morbid obesity, hyperlipidemia presenting with shortness of breath after collapsing at the top of the stairs becoming unresponsive with EMS finding her hypertensive and bradycardic with heart rates as low as the 20s.  Patient noted to have a BNP of 23,000, creatinine of 3 with posterior elevations on CT treated with Rocephin  and azithromycin .  She has been compliant with her medications including diuretics as  well as CPAP for OSA. She has noted decreased urine output, any worsening of her lower extremity edema, obstructive like symptoms at home but has had a poor appetite for the past week.  She denied any nausea, vomiting, diarrhea, chest pain.  She has been seen by cardiology and advanced heart failure team feeling high-dose Lasix  (escalating to 120 mg twice a day on 12/25) being transitioned to IV Lasix  as well as milrinone .  Blood pressure has been as high as 160s over 70s and later dropping to as low as 99-100s systolic.   ROS Pertinent items are noted in HPI.  Chemistry and CBC: Creatinine, Ser  Date/Time Value Ref Range Status  07/21/2024 04:13 AM 3.88 (H) 0.44 - 1.00 mg/dL Final  87/73/7974 87:83 AM 3.73 (H) 0.44 - 1.00 mg/dL Final  87/74/7974 96:94 AM 3.00 (H) 0.44 - 1.00 mg/dL Final  87/75/7974 89:60 PM 2.95 (H) 0.44 - 1.00 mg/dL Final  87/75/7974 92:95 PM 3.10 (H) 0.44 - 1.00 mg/dL Final  87/75/7974 93:47 PM 3.07 (H) 0.44 - 1.00 mg/dL Final  89/86/7974 97:71 PM 3.25 (H) 0.40 - 1.20 mg/dL Final  95/85/7974 97:80 PM 4.08 (H) 0.44 - 1.00 mg/dL Final  96/82/7974 89:76 AM 3.81 (H) 0.44 - 1.00 mg/dL Final  96/88/7974 94:78 AM 4.67 (H) 0.44 - 1.00 mg/dL Final  96/89/7974 90:81 AM 4.73 (H) 0.44 - 1.00 mg/dL Final  96/90/7974 94:47 AM 4.32 (H) 0.44 - 1.00 mg/dL Final  96/91/7974 95:76 AM 4.10 (H) 0.44 - 1.00 mg/dL Final  96/92/7974 95:53 AM 4.17 (H) 0.44 - 1.00 mg/dL Final  96/93/7974 95:44 AM 3.84 (H) 0.44 - 1.00 mg/dL Final  96/94/7974 95:46 AM 3.85 (H) 0.44 -  1.00 mg/dL Final  96/95/7974 93:64 AM 3.39 (H) 0.44 - 1.00 mg/dL Final  96/96/7974 90:77 PM 3.44 (H) 0.44 - 1.00 mg/dL Final  98/86/7974 89:80 AM 3.54 (H) 0.44 - 1.00 mg/dL Final  88/82/7976 88:66 AM 2.95 (H) 0.57 - 1.00 mg/dL Final  88/98/7976 97:41 PM 2.71 (H) 0.44 - 1.00 mg/dL Final  91/70/7976 88:92 AM 3.01 (H) 0.40 - 1.20 mg/dL Final  91/77/7976 98:50 PM 2.65 (H) 0.40 - 1.20 mg/dL Final  88/81/7977 87:95 PM 1.76 (H) 0.40 -  1.20 mg/dL Final  91/76/7977 87:84 PM 2.46 (H) 0.40 - 1.20 mg/dL Final  91/80/7977 96:62 AM 2.64 (H) 0.44 - 1.00 mg/dL Final  91/81/7977 98:80 AM 2.82 (H) 0.44 - 1.00 mg/dL Final  91/82/7977 97:48 AM 2.71 (H) 0.44 - 1.00 mg/dL Final  91/83/7977 95:93 AM 2.64 (H) 0.44 - 1.00 mg/dL Final  91/84/7977 91:63 AM 2.45 (H) 0.44 - 1.00 mg/dL Final  91/85/7977 95:81 AM 2.25 (H) 0.44 - 1.00 mg/dL Final  91/86/7977 96:73 AM 1.70 (H) 0.44 - 1.00 mg/dL Final  91/87/7977 94:61 PM 1.79 (H) 0.44 - 1.00 mg/dL Final  93/90/7977 89:63 AM 1.93 (H) 0.57 - 1.00 mg/dL Final  94/86/7977 89:61 AM 2.03 (H) 0.40 - 1.20 mg/dL Final  96/91/7978 88:73 AM 1.61 (H) 0.40 - 1.20 mg/dL Final  91/96/7979 88:66 AM 1.49 (H) 0.40 - 1.20 mg/dL Final  91/71/7980 88:90 AM 1.43 (H) 0.40 - 1.20 mg/dL Final  93/79/7980 95:47 PM 1.56 (H) 0.44 - 1.00 mg/dL Final  93/81/7980 95:87 PM 1.58 (H) 0.40 - 1.20 mg/dL Final  96/79/7980 96:75 PM 1.33 (H) 0.40 - 1.20 mg/dL Final  97/80/7980 89:81 AM 1.42 (H) 0.40 - 1.20 mg/dL Final  91/84/7981 88:64 AM 1.37 (H) 0.40 - 1.20 mg/dL Final  89/96/7982 89:40 AM 1.31 (H) 0.40 - 1.20 mg/dL Final  88/83/7983 95:85 PM 1.41 (H) 0.44 - 1.00 mg/dL Final  88/83/7983 90:77 AM 1.34 (H) 0.40 - 1.20 mg/dL Final  93/78/7983 90:88 AM 1.26 (H) 0.40 - 1.20 mg/dL Final  94/96/7983 90:42 AM 1.02 (H) 0.44 - 1.00 mg/dL Final  90/88/7984 89:89 AM 1.1 0.4 - 1.2 mg/dL Final  90/94/7984 97:52 AM 1.13 (H) 0.50 - 1.10 mg/dL Final  90/95/7984 87:58 PM 0.98 0.50 - 1.10 mg/dL Final  91/68/7984 98:46 PM 1.00 0.50 - 1.10 mg/dL Final   Recent Labs  Lab 07/19/24 1852 07/19/24 1904 07/19/24 2111 07/19/24 2239 07/20/24 0305 07/20/24 0353 07/21/24 0016 07/21/24 0413  NA 141 143  143 143  --  141 143 140 141  K 4.2 4.2  4.2 3.8  --  4.1 4.1 4.7 4.2  CL 106 111  --   --  109  --  106 106  CO2 16*  --   --   --  21*  --  20* 20*  GLUCOSE 251* 245*  --   --  205*  --  122* 126*  BUN 45* 56*  --   --  46*  --  51* 52*   CREATININE 3.07* 3.10*  --  2.95* 3.00*  --  3.73* 3.88*  CALCIUM  8.9  --   --   --  8.4*  --  9.1 9.1  PHOS  --   --   --   --  4.8*  --  5.1*  --    Recent Labs  Lab 07/19/24 1852 07/19/24 1904 07/19/24 2239 07/20/24 0305 07/20/24 0353 07/21/24 0016  WBC 7.8  --  7.1 6.4  --  11.8*  NEUTROABS  6.2  --   --   --   --   --   HGB 9.8*   < > 8.1* 8.3* 9.5* 9.8*  HCT 32.4*   < > 26.3* 26.7* 28.0* 31.0*  MCV 74.0*  --  72.3* 72.8*  --  71.6*  PLT 196  --  142* 118*  --  173   < > = values in this interval not displayed.   Liver Function Tests: Recent Labs  Lab 07/19/24 1852 07/21/24 0413  AST 59* 38  ALT 28 24  ALKPHOS 246* 206*  BILITOT 1.1 0.9  PROT 7.8 7.2  ALBUMIN 3.8 3.6   No results for input(s): LIPASE, AMYLASE in the last 168 hours. Recent Labs  Lab 07/20/24 1052  AMMONIA 64*   Cardiac Enzymes: No results for input(s): CKTOTAL, CKMB, CKMBINDEX, TROPONINI in the last 168 hours. Iron  Studies: No results for input(s): IRON , TIBC, TRANSFERRIN, FERRITIN in the last 72 hours. PT/INR: @LABRCNTIP (inr:5)  Xrays/Other Studies: ) Results for orders placed or performed during the hospital encounter of 07/19/24 (from the past 48 hours)  Comprehensive metabolic panel     Status: Abnormal   Collection Time: 07/19/24  6:52 PM  Result Value Ref Range   Sodium 141 135 - 145 mmol/L   Potassium 4.2 3.5 - 5.1 mmol/L    Comment: HEMOLYSIS AT THIS LEVEL MAY AFFECT RESULT   Chloride 106 98 - 111 mmol/L   CO2 16 (L) 22 - 32 mmol/L   Glucose, Bld 251 (H) 70 - 99 mg/dL    Comment: Glucose reference range applies only to samples taken after fasting for at least 8 hours.   BUN 45 (H) 8 - 23 mg/dL   Creatinine, Ser 6.92 (H) 0.44 - 1.00 mg/dL   Calcium  8.9 8.9 - 10.3 mg/dL   Total Protein 7.8 6.5 - 8.1 g/dL   Albumin 3.8 3.5 - 5.0 g/dL   AST 59 (H) 15 - 41 U/L    Comment: HEMOLYSIS AT THIS LEVEL MAY AFFECT RESULT   ALT 28 0 - 44 U/L   Alkaline Phosphatase  246 (H) 38 - 126 U/L   Total Bilirubin 1.1 0.0 - 1.2 mg/dL   GFR, Estimated 16 (L) >60 mL/min    Comment: (NOTE) Calculated using the CKD-EPI Creatinine Equation (2021)    Anion gap 20 (H) 5 - 15    Comment: Performed at [facility], [address] [place], [place] [address]  CBC with Differential     Status: Abnormal   Collection Time: 07/19/24  6:52 PM  Result Value Ref Range   WBC 7.8 4.0 - 10.5 K/uL   RBC 4.38 3.87 - 5.11 MIL/uL   Hemoglobin 9.8 (L) 12.0 - 15.0 g/dL   HCT 67.5 (L) 63.9 - 53.9 %   MCV 74.0 (L) 80.0 - 100.0 fL   MCH 22.4 (L) 26.0 - 34.0 pg   MCHC 30.2 30.0 - 36.0 g/dL   RDW 77.3 (H) 88.4 - 84.4 %   Platelets 196 150 - 400 K/uL    Comment: REPEATED TO VERIFY   nRBC 0.0 0.0 - 0.2 %   Neutrophils Relative % 79 %   Neutro Abs 6.2 1.7 - 7.7 K/uL   Lymphocytes Relative 14 %   Lymphs Abs 1.1 0.7 - 4.0 K/uL   Monocytes Relative 4 %   Monocytes Absolute 0.3 0.1 - 1.0 K/uL   Eosinophils Relative 1 %   Eosinophils Absolute 0.1 0.0 - 0.5 K/uL  Basophils Relative 1 %   Basophils Absolute 0.1 0.0 - 0.1 K/uL   Immature Granulocytes 1 %   Abs Immature Granulocytes 0.05 0.00 - 0.07 K/uL    Comment: Performed at [facility], [address] [place], [place] [address]  Protime-INR     Status: Abnormal   Collection Time: 07/19/24  6:52 PM  Result Value Ref Range   Prothrombin Time 17.0 (H) 11.4 - 15.2 seconds   INR 1.3 (H) 0.8 - 1.2    Comment: (NOTE) INR goal varies based on device and disease states. Performed at [facility], [address] [place], [place] [address]   Pro Brain natriuretic peptide     Status: Abnormal   Collection Time: 07/19/24  6:52 PM  Result Value Ref Range   Pro Brain Natriuretic Peptide 23,439.0 (H) <300.0 pg/mL    Comment: (NOTE) Age Group        Cut-Points    Interpretation  < 50 years     450 pg/mL       NT-proBNP > 450 pg/mL indicates                                ADHF is likely              50 to 75 years   900 pg/mL      NT-proBNP > 900 pg/mL indicates          ADHF is likely  > 75 years      1800 pg/mL     NT-proBNP > 1800 pg/mL indicates          ADHF is likely                           All ages    Results between       Indeterminate. Further clinical             300 and the cut-   information is needed to determine            point for age group   if ADHF is present.                                                             Elecsys proBNP II/ Elecsys proBNP II STAT           Cut-Point                       Interpretation  300 pg/mL                    NT-proBNP <300pg/mL indicates                             ADHF is not likely  Performed at [facility], [address] [place], [place] [address]   I-stat chem 8, ED (not at [facility], [facility] or [facility])     Status: Abnormal   Collection Time: 07/19/24  7:04 PM  Result Value Ref Range   Sodium 143 135 - 145 mmol/L   Potassium 4.2 3.5 - 5.1  mmol/L   Chloride 111 98 - 111 mmol/L   BUN 56 (H) 8 - 23 mg/dL   Creatinine, Ser 6.89 (H) 0.44 - 1.00 mg/dL   Glucose, Bld 754 (H) 70 - 99 mg/dL    Comment: Glucose reference range applies only to samples taken after fasting for at least 8 hours.   Calcium , Ion 1.02 (L) 1.15 - 1.40 mmol/L   TCO2 18 (L) 22 - 32 mmol/L   Hemoglobin 11.9 (L) 12.0 - 15.0 g/dL   HCT 64.9 (L) 63.9 - 53.9 %  I-Stat venous blood gas, ([facility] ED, [facility], [facility])     Status: Abnormal   Collection Time: 07/19/24  7:04 PM  Result Value Ref Range   pH, Ven 7.259 7.25 - 7.43   pCO2, Ven 41.0 (L) 44 - 60 mmHg   pO2, Ven 85 (H) 32 - 45 mmHg   Bicarbonate 18.3 (L) 20.0 - 28.0 mmol/L   TCO2 20 (L) 22 - 32 mmol/L   O2 Saturation 95 %   Acid-base deficit 8.0 (H) 0.0 - 2.0 mmol/L   Sodium 143 135 - 145 mmol/L   Potassium 4.2 3.5 - 5.1 mmol/L   Calcium , Ion 1.03 (L) 1.15 - 1.40 mmol/L   HCT 35.0 (L) 36.0 - 46.0 %   Hemoglobin 11.9 (L) 12.0 - 15.0 g/dL   Sample type VENOUS   I-Stat Lactic Acid, ED     Status: Abnormal   Collection  Time: 07/19/24  7:05 PM  Result Value Ref Range   Lactic Acid, Venous 5.1 (HH) 0.5 - 1.9 mmol/L   Comment NOTIFIED PHYSICIAN   Urinalysis, w/ Reflex to Culture (Infection Suspected) -Urine, Clean Catch     Status: Abnormal   Collection Time: 07/19/24  8:00 PM  Result Value Ref Range   Specimen Source URINE, CATHETERIZED    Color, Urine YELLOW YELLOW   APPearance HAZY (A) CLEAR   Specific Gravity, Urine 1.012 1.005 - 1.030   pH 5.0 5.0 - 8.0   Glucose, UA 50 (A) NEGATIVE mg/dL   Hgb urine dipstick NEGATIVE NEGATIVE   Bilirubin Urine NEGATIVE NEGATIVE   Ketones, ur NEGATIVE NEGATIVE mg/dL   Protein, ur >=699 (A) NEGATIVE mg/dL   Nitrite NEGATIVE NEGATIVE   Leukocytes,Ua NEGATIVE NEGATIVE   RBC / HPF 6-10 0 - 5 RBC/hpf   WBC, UA 0-5 0 - 5 WBC/hpf    Comment:        Reflex urine culture not performed if WBC <=10, OR if Squamous epithelial cells >5. If Squamous epithelial cells >5 suggest recollection.    Bacteria, UA RARE (A) NONE SEEN   Squamous Epithelial / HPF 0-5 0 - 5 /HPF    Comment: Performed at [facility], [address] [place], [place] [address]  Resp panel by RT-PCR (RSV, Flu A&B, Covid) Anterior Nasal Swab     Status: None   Collection Time: 07/19/24  8:00 PM   Specimen: Anterior Nasal Swab  Result Value Ref Range   SARS Coronavirus 2 by RT PCR NEGATIVE NEGATIVE   Influenza A by PCR NEGATIVE NEGATIVE   Influenza B by PCR NEGATIVE NEGATIVE    Comment: (NOTE) The Xpert Xpress SARS-CoV-2/FLU/RSV plus assay is intended as an aid in the diagnosis of influenza from Nasopharyngeal swab specimens and should not be used as a sole basis for treatment. Nasal washings and aspirates are unacceptable for Xpert Xpress SARS-CoV-2/FLU/RSV testing.  Fact Sheet for Patients: bloggercourse.com  Fact Sheet for Healthcare Providers: seriousbroker.it  This  test is not yet approved or cleared by the [place]  FDA and has  been authorized for detection and/or diagnosis of SARS-CoV-2 by FDA under an Emergency Use Authorization (EUA). This EUA will remain in effect (meaning this test can be used) for the duration of the COVID-19 declaration under Section 564(b)(1) of the Act, 21 U.S.C. section 360bbb-3(b)(1), unless the authorization is terminated or revoked.     Resp Syncytial Virus by PCR NEGATIVE NEGATIVE    Comment: (NOTE) Fact Sheet for Patients: bloggercourse.com  Fact Sheet for Healthcare Providers: seriousbroker.it  This test is not yet approved or cleared by the [place]  FDA and has been authorized for detection and/or diagnosis of SARS-CoV-2 by FDA under an Emergency Use Authorization (EUA). This EUA will remain in effect (meaning this test can be used) for the duration of the COVID-19 declaration under Section 564(b)(1) of the Act, 21 U.S.C. section 360bbb-3(b)(1), unless the authorization is terminated or revoked.  Performed at [facility], [address], [place], [place] [address]   MRSA Next Gen by PCR, Nasal     Status: None   Collection Time: 07/19/24  8:13 PM   Specimen: Nasal Mucosa; Nasal Swab  Result Value Ref Range   MRSA by PCR Next Gen NOT DETECTED NOT DETECTED    Comment: (NOTE) The GeneXpert MRSA Assay (FDA approved for NASAL specimens only), is one component of a comprehensive MRSA colonization surveillance program. It is not intended to diagnose MRSA infection nor to guide or monitor treatment for MRSA infections. Test performance is not FDA approved in patients less than 21 years old. Performed at [facility], [address] [place], [place] [address]   I-Stat venous blood gas, ED     Status: Abnormal   Collection Time: 07/19/24  9:11 PM  Result Value Ref Range   pH, Ven 7.279 7.25 - 7.43   pCO2, Ven 43.4 (L) 44 - 60 mmHg   pO2, Ven 55 (H) 32 - 45 mmHg   Bicarbonate 20.8 20.0 - 28.0 mmol/L    TCO2 22 22 - 32 mmol/L   O2 Saturation 87 %   Acid-base deficit 6.0 (H) 0.0 - 2.0 mmol/L   Sodium 143 135 - 145 mmol/L   Potassium 3.8 3.5 - 5.1 mmol/L   Calcium , Ion 1.15 1.15 - 1.40 mmol/L   HCT 29.0 (L) 36.0 - 46.0 %   Hemoglobin 9.9 (L) 12.0 - 15.0 g/dL   Patient temperature 04.6 F    Collection site RADIAL, ALLEN'S TEST ACCEPTABLE    Drawn by RT    Sample type VENOUS   Glucose, capillary     Status: Abnormal   Collection Time: 07/19/24  9:27 PM  Result Value Ref Range   Glucose-Capillary 198 (H) 70 - 99 mg/dL    Comment: Glucose reference range applies only to samples taken after fasting for at least 8 hours.  TSH     Status: None   Collection Time: 07/19/24 10:39 PM  Result Value Ref Range   TSH 2.000 0.350 - 4.500 uIU/mL    Comment: Performed at [facility], [address] [place], [place] [address]  T4, free     Status: None   Collection Time: 07/19/24 10:39 PM  Result Value Ref Range   Free T4 1.41 0.80 - 2.00 ng/dL    Comment: Performed at [facility], [address] [place], [place] [address]  CBC     Status: Abnormal  Collection Time: 07/19/24 10:39 PM  Result Value Ref Range   WBC 7.1 4.0 - 10.5 K/uL   RBC 3.64 (L) 3.87 - 5.11 MIL/uL   Hemoglobin 8.1 (L) 12.0 - 15.0 g/dL    Comment: Reticulocyte Hemoglobin testing may be clinically indicated, consider ordering this additional test [id number]    HCT 26.3 (L) 36.0 - 46.0 %   MCV 72.3 (L) 80.0 - 100.0 fL   MCH 22.3 (L) 26.0 - 34.0 pg   MCHC 30.8 30.0 - 36.0 g/dL   RDW 78.2 (H) 88.4 - 84.4 %   Platelets 142 (L) 150 - 400 K/uL    Comment: REPEATED TO VERIFY   nRBC 0.0 0.0 - 0.2 %    Comment: Performed at [facility], [address], [place], [place] [address]  Creatinine, serum     Status: Abnormal   Collection Time: 07/19/24 10:39 PM  Result Value Ref Range   Creatinine, Ser 2.95 (H) 0.44 - 1.00 mg/dL   GFR, Estimated 17 (L) >60 mL/min    Comment: (NOTE) Calculated using the CKD-EPI  Creatinine Equation (2021) Performed at [facility], [address], [place], [place] [address]   Hemoglobin A1c     Status: Abnormal   Collection Time: 07/19/24 10:39 PM  Result Value Ref Range   Hgb A1c MFr Bld 7.2 (H) 4.8 - 5.6 %    Comment: (NOTE) Diagnosis of Diabetes The following HbA1c ranges recommended by the American Diabetes Association (ADA) may be used as an aid in the diagnosis of diabetes mellitus.  Hemoglobin             Suggested A1C NGSP%              Diagnosis  <5.7                   Non Diabetic  5.7-6.4                Pre-Diabetic  >6.4                   Diabetic  <7.0                   Glycemic control for                       adults with diabetes.     Mean Plasma Glucose 159.94 mg/dL    Comment: Performed at [facility], [address], [place], [place] [address]  Glucose, capillary     Status: Abnormal   Collection Time: 07/19/24 11:08 PM  Result Value Ref Range   Glucose-Capillary 184 (H) 70 - 99 mg/dL    Comment: Glucose reference range applies only to samples taken after fasting for at least 8 hours.  Triglycerides     Status: None   Collection Time: 07/20/24  3:05 AM  Result Value Ref Range   Triglycerides 68 <150 mg/dL    Comment: Performed at [facility], [address], [place], [place] [address]  CBC     Status: Abnormal   Collection Time: 07/20/24  3:05 AM  Result Value Ref Range   WBC 6.4 4.0 - 10.5 K/uL   RBC 3.67 (L) 3.87 - 5.11 MIL/uL   Hemoglobin 8.3 (L) 12.0 - 15.0 g/dL    Comment: Reticulocyte Hemoglobin testing may be clinically indicated, consider ordering this additional test [id number]    HCT 26.7 (L) 36.0 -  46.0 %   MCV 72.8 (L) 80.0 - 100.0 fL   MCH 22.6 (L) 26.0 - 34.0 pg   MCHC 31.1 30.0 - 36.0 g/dL   RDW 78.0 (H) 88.4 - 84.4 %   Platelets 118 (L) 150 - 400 K/uL    Comment: REPEATED TO VERIFY   nRBC 0.0 0.0 - 0.2 %    Comment: Performed at [facility], [address], [place],  [place] [address]  Basic metabolic panel     Status: Abnormal   Collection Time: 07/20/24  3:05 AM  Result Value Ref Range   Sodium 141 135 - 145 mmol/L   Potassium 4.1 3.5 - 5.1 mmol/L   Chloride 109 98 - 111 mmol/L   CO2 21 (L) 22 - 32 mmol/L   Glucose, Bld 205 (H) 70 - 99 mg/dL    Comment: Glucose reference range applies only to samples taken after fasting for at least 8 hours.   BUN 46 (H) 8 - 23 mg/dL   Creatinine, Ser 6.99 (H) 0.44 - 1.00 mg/dL   Calcium  8.4 (L) 8.9 - 10.3 mg/dL   GFR, Estimated 17 (L) >60 mL/min    Comment: (NOTE) Calculated using the CKD-EPI Creatinine Equation (2021)    Anion gap 12 5 - 15    Comment: Performed at [facility], [address] [place], [place] [address]  Magnesium      Status: None   Collection Time: 07/20/24  3:05 AM  Result Value Ref Range   Magnesium  2.0 1.7 - 2.4 mg/dL    Comment: Performed at [facility], [address] [place], [place] [address]  Phosphorus     Status: Abnormal   Collection Time: 07/20/24  3:05 AM  Result Value Ref Range   Phosphorus 4.8 (H) 2.5 - 4.6 mg/dL    Comment: Performed at [facility], [address], [place], [place] [address]  Glucose, capillary     Status: Abnormal   Collection Time: 07/20/24  3:22 AM  Result Value Ref Range   Glucose-Capillary 192 (H) 70 - 99 mg/dL    Comment: Glucose reference range applies only to samples taken after fasting for at least 8 hours.  I-STAT 7, (LYTES, BLD GAS, ICA, H+H)     Status: Abnormal   Collection Time: 07/20/24  3:53 AM  Result Value Ref Range   pH, Arterial 7.310 (L) 7.35 - 7.45   pCO2 arterial 39.6 32 - 48 mmHg   pO2, Arterial 325 (H) 83 - 108 mmHg   Bicarbonate 20.2 20.0 - 28.0 mmol/L   TCO2 21 (L) 22 - 32 mmol/L   O2 Saturation 100 %   Acid-base deficit 6.0 (H) 0.0 - 2.0 mmol/L   Sodium 143 135 - 145 mmol/L   Potassium 4.1 3.5 - 5.1 mmol/L   Calcium , Ion 1.16 1.15 - 1.40 mmol/L   HCT 28.0 (L) 36.0 - 46.0 %   Hemoglobin 9.5 (L) 12.0 -  15.0 g/dL   Patient temperature 03.5 F    Sample type ARTERIAL   Glucose, capillary     Status: Abnormal   Collection Time: 07/20/24  7:55 AM  Result Value Ref Range   Glucose-Capillary 193 (H) 70 - 99 mg/dL    Comment: Glucose reference range applies only to samples taken after fasting for at least 8 hours.  Ammonia     Status: Abnormal   Collection Time: 07/20/24 10:52 AM  Result Value Ref Range   Ammonia 64 (  H) 9 - 35 umol/L    Comment: Performed at [facility], [address], [place], [place] [address]  Lactic acid, plasma     Status: None   Collection Time: 07/20/24 10:52 AM  Result Value Ref Range   Lactic Acid, Venous 1.6 0.5 - 1.9 mmol/L    Comment: Performed at [facility], [address] [place], [place] [address]  Glucose, capillary     Status: Abnormal   Collection Time: 07/20/24 11:20 AM  Result Value Ref Range   Glucose-Capillary 147 (H) 70 - 99 mg/dL    Comment: Glucose reference range applies only to samples taken after fasting for at least 8 hours.  Lactic acid, plasma     Status: None   Collection Time: 07/20/24 12:59 PM  Result Value Ref Range   Lactic Acid, Venous 1.5 0.5 - 1.9 mmol/L    Comment: Performed at [facility], [address] [place], [place] [address]  Glucose, capillary     Status: Abnormal   Collection Time: 07/20/24  3:23 PM  Result Value Ref Range   Glucose-Capillary 124 (H) 70 - 99 mg/dL    Comment: Glucose reference range applies only to samples taken after fasting for at least 8 hours.  Glucose, capillary     Status: Abnormal   Collection Time: 07/20/24  8:06 PM  Result Value Ref Range   Glucose-Capillary 121 (H) 70 - 99 mg/dL    Comment: Glucose reference range applies only to samples taken after fasting for at least 8 hours.  Glucose, capillary     Status: Abnormal   Collection Time: 07/20/24 11:31 PM  Result Value Ref Range   Glucose-Capillary 114 (H) 70 - 99 mg/dL    Comment: Glucose reference range applies  only to samples taken after fasting for at least 8 hours.  CBC     Status: Abnormal   Collection Time: 07/21/24 12:16 AM  Result Value Ref Range   WBC 11.8 (H) 4.0 - 10.5 K/uL   RBC 4.33 3.87 - 5.11 MIL/uL   Hemoglobin 9.8 (L) 12.0 - 15.0 g/dL   HCT 68.9 (L) 63.9 - 53.9 %   MCV 71.6 (L) 80.0 - 100.0 fL   MCH 22.6 (L) 26.0 - 34.0 pg   MCHC 31.6 30.0 - 36.0 g/dL   RDW 77.5 (H) 88.4 - 84.4 %   Platelets 173 150 - 400 K/uL   nRBC 0.0 0.0 - 0.2 %    Comment: Performed at [facility], [address] [place], [place] [address]  Cooxemetry Panel (carboxy, met, total hgb, O2 sat)     Status: Abnormal   Collection Time: 07/21/24 12:16 AM  Result Value Ref Range   Total hemoglobin 11.7 (L) 12.0 - 16.0 g/dL   O2 Saturation 24.7 %   Carboxyhemoglobin 1.5 0.5 - 1.5 %   Methemoglobin <0.7 0.0 - 1.5 %    Comment: Performed at [facility], [address], [place], [place] [address]  Basic metabolic panel     Status: Abnormal   Collection Time: 07/21/24 12:16 AM  Result Value Ref Range   Sodium 140 135 - 145 mmol/L   Potassium 4.7 3.5 - 5.1 mmol/L    Comment: HEMOLYSIS AT THIS LEVEL MAY AFFECT RESULT   Chloride 106 98 - 111 mmol/L   CO2 20 (L) 22 - 32 mmol/L   Glucose, Bld 122 (H) 70 - 99 mg/dL    Comment: Glucose reference  range applies only to samples taken after fasting for at least 8 hours.   BUN 51 (H) 8 - 23 mg/dL   Creatinine, Ser 6.26 (H) 0.44 - 1.00 mg/dL   Calcium  9.1 8.9 - 10.3 mg/dL   GFR, Estimated 13 (L) >60 mL/min    Comment: (NOTE) Calculated using the CKD-EPI Creatinine Equation (2021)    Anion gap 15 5 - 15    Comment: Performed at [facility], [address] [place], [place] [address]  Magnesium      Status: None   Collection Time: 07/21/24 12:16 AM  Result Value Ref Range   Magnesium  2.2 1.7 - 2.4 mg/dL    Comment: Performed at [facility], [address], [place], [place] [address]  Phosphorus     Status: Abnormal   Collection Time:  07/21/24 12:16 AM  Result Value Ref Range   Phosphorus 5.1 (H) 2.5 - 4.6 mg/dL    Comment: Performed at [facility], [address] [place], [place] [address]  Troponin T, High Sensitivity     Status: Abnormal   Collection Time: 07/21/24 12:16 AM  Result Value Ref Range   Troponin T High Sensitivity 519 (HH) 0 - 19 ng/L    Comment: Critical Value, Read Back and verified with HOFFMAN A, RN 0103 07/21/2024 SANDOVAL K  (NOTE) Biotin concentrations > 1000 ng/mL falsely decrease TnT results.  Serial cardiac troponin measurements are suggested.  Refer to the Links section for chest pain algorithms and additional  guidance. Performed at [facility], [address], [place], [place] [address]   Glucose, capillary     Status: Abnormal   Collection Time: 07/21/24  3:19 AM  Result Value Ref Range   Glucose-Capillary 119 (H) 70 - 99 mg/dL    Comment: Glucose reference range applies only to samples taken after fasting for at least 8 hours.  Basic metabolic panel with GFR     Status: Abnormal   Collection Time: 07/21/24  4:13 AM  Result Value Ref Range   Sodium 141 135 - 145 mmol/L   Potassium 4.2 3.5 - 5.1 mmol/L   Chloride 106 98 - 111 mmol/L   CO2 20 (L) 22 - 32 mmol/L   Glucose, Bld 126 (H) 70 - 99 mg/dL    Comment: Glucose reference range applies only to samples taken after fasting for at least 8 hours.   BUN 52 (H) 8 - 23 mg/dL   Creatinine, Ser 6.11 (H) 0.44 - 1.00 mg/dL   Calcium  9.1 8.9 - 10.3 mg/dL   GFR, Estimated 12 (L) >60 mL/min    Comment: (NOTE) Calculated using the CKD-EPI Creatinine Equation (2021)    Anion gap 15 5 - 15    Comment: Performed at [facility], [address] [place], [place] [address]  Hepatic function panel     Status: Abnormal   Collection Time: 07/21/24  4:13 AM  Result Value Ref Range   Total Protein 7.2 6.5 - 8.1 g/dL   Albumin 3.6 3.5 - 5.0 g/dL   AST 38 15 - 41 U/L   ALT 24 0 - 44 U/L   Alkaline Phosphatase 206 (H) 38 -  126 U/L   Total Bilirubin 0.9 0.0 - 1.2 mg/dL   Bilirubin, Direct 0.6 (H) 0.0 - 0.2 mg/dL   Indirect Bilirubin 0.3 0.3 - 0.9 mg/dL    Comment: Performed at [facility], [address], [place], [place] [address]  Magnesium      Status: None   Collection Time: 07/21/24  4:13 AM  Result Value Ref Range   Magnesium  2.2 1.7 - 2.4 mg/dL    Comment: Performed at [facility], [address], [place], [place] [address]  Troponin T, High Sensitivity     Status: Abnormal   Collection Time: 07/21/24  4:13 AM  Result Value Ref Range   Troponin T High Sensitivity 530 (HH) 0 - 19 ng/L    Comment: Critical value noted. Value is consistent with previously reported and called value  (NOTE) Biotin concentrations > 1000 ng/mL falsely decrease TnT results.  Serial cardiac troponin measurements are suggested.  Refer to the Links section for chest pain algorithms and additional  guidance. Performed at [facility], [address] [place], [place] [address]   Glucose, capillary     Status: Abnormal   Collection Time: 07/21/24  7:30 AM  Result Value Ref Range   Glucose-Capillary 127 (H) 70 - 99 mg/dL    Comment: Glucose reference range applies only to samples taken after fasting for at least 8 hours.   ECHOCARDIOGRAM COMPLETE Result Date: 07/20/2024    ECHOCARDIOGRAM REPORT   Patient Name:   MIARA EMMINGER Date of Exam: 07/20/2024 Medical Rec #:  [id number]              Height:       60.0 in Accession #:    [id number]             Weight:       293.0 lb Date of Birth:  1960/12/28              BSA:          2.195 m Patient Age:    63 years               BP:           147/82 mmHg Patient Gender: F                      HR:           60 bpm. Exam Location:  Inpatient Procedure: 2D Echo (Both Spectral and Color Flow Doppler were utilized during            procedure). Indications:    congestive heart failure  History:        Patient has prior history of Echocardiogram examinations, most                  recent 09/28/2023. Chronic kidney disease, Arrythmias:Bradycardia;                 Risk Factors:Hypertension, Dyslipidemia, Sleep Apnea and                 Diabetes.  Sonographer:    Tinnie Barefoot RDCS Referring Phys: [id number] SHENG L HALEY  Sonographer Comments: Patient is obese and echo performed with patient supine and on artificial respirator. Image acquisition challenging due to respiratory motion. IMPRESSIONS  1. Left ventricular ejection fraction, by estimation, is 35 to 40%. The left ventricle has moderately decreased function. The left ventricle demonstrates global hypokinesis. Left ventricular diastolic parameters are consistent with Grade I diastolic dysfunction (impaired relaxation). There is the interventricular septum is flattened in systole and diastole, consistent with right ventricular pressure and volume overload.  2. Right ventricular systolic function Moderately to severely reduced. The right ventricular size is moderately to severely  enlarged. There is moderately elevated pulmonary artery systolic pressure.  3. Right atrial size was severely dilated.  4. The mitral valve is normal in structure. No evidence of mitral valve regurgitation. No evidence of mitral stenosis.  5. Hepatic systolic flow reversal suggestive of severe tricuspid regurgitation. . The tricuspid valve is abnormal. Tricuspid valve regurgitation is severe.  6. The aortic valve is tricuspid. Aortic valve regurgitation is not visualized. No aortic stenosis is present.  7. The inferior vena cava is dilated in size with <50% respiratory variability, suggesting right atrial pressure of 15 mmHg. FINDINGS  Left Ventricle: Left ventricular ejection fraction, by estimation, is 35 to 40%. The left ventricle has moderately decreased function. The left ventricle demonstrates global hypokinesis. The left ventricular internal cavity size was normal in size. There is no left ventricular hypertrophy. The interventricular septum is  flattened in systole and diastole, consistent with right ventricular pressure and volume overload. Left ventricular diastolic parameters are consistent with Grade I diastolic dysfunction (impaired relaxation). Right Ventricle: The right ventricular size is moderately to severely enlarged. Right vetricular wall thickness was not well visualized. Right ventricular systolic function Moderately to severely reduced. There is moderately elevated pulmonary artery systolic pressure. The tricuspid regurgitant velocity is 3.24 m/s, and with an assumed right atrial pressure of 15 mmHg, the estimated right ventricular systolic pressure is 57.0 mmHg. Left Atrium: Left atrial size was normal in size. Right Atrium: Right atrial size was severely dilated. Pericardium: There is no evidence of pericardial effusion. Mitral Valve: The mitral valve is normal in structure. There is mild thickening of the mitral valve leaflet(s). There is mild calcification of the mitral valve leaflet(s). Mild mitral annular calcification. No evidence of mitral valve regurgitation. No evidence of mitral valve stenosis. Tricuspid Valve: Hepatic systolic flow reversal suggestive of severe tricuspid regurgitation. The tricuspid valve is abnormal. Tricuspid valve regurgitation is severe. No evidence of tricuspid stenosis. Aortic Valve: The aortic valve is tricuspid. Aortic valve regurgitation is not visualized. No aortic stenosis is present. Aortic valve mean gradient measures 3.3 mmHg. Aortic valve peak gradient measures 7.2 mmHg. Aortic valve area, by VTI measures 1.55 cm. Pulmonic Valve: The pulmonic valve was not well visualized. Pulmonic valve regurgitation is not visualized. No evidence of pulmonic stenosis. Aorta: The aortic root and ascending aorta are structurally normal, with no evidence of dilitation. Venous: The inferior vena cava is dilated in size with less than 50% respiratory variability, suggesting right atrial pressure of 15 mmHg.  IAS/Shunts: No atrial level shunt detected by color flow Doppler.  LEFT VENTRICLE PLAX 2D LVIDd:         5.20 cm      Diastology LVIDs:         3.90 cm      LV e' medial:    3.26 cm/s LV PW:         1.00 cm      LV E/e' medial:  22.0 LV IVS:        0.90 cm      LV e' lateral:   4.35 cm/s LVOT diam:     2.00 cm      LV E/e' lateral: 16.5 LV SV:         51 LV SV Index:   23 LVOT Area:     3.14 cm LV IVRT:       274 msec  LV Volumes (MOD) LV vol d, MOD A2C: 83.9 ml LV vol d, MOD A4C: 112.0 ml LV vol s, MOD  A2C: 52.0 ml LV vol s, MOD A4C: 67.0 ml LV SV MOD A2C:     31.9 ml LV SV MOD A4C:     112.0 ml LV SV MOD BP:      35.6 ml RIGHT VENTRICLE            IVC RV Basal diam:  3.10 cm    IVC diam: 2.60 cm RV S prime:     7.72 cm/s TAPSE (M-mode): 1.6 cm LEFT ATRIUM             Index        RIGHT ATRIUM           Index LA diam:        4.40 cm 2.00 cm/m   RA Area:     19.00 cm LA Vol (A2C):   65.1 ml 29.65 ml/m  RA Volume:   58.30 ml  26.55 ml/m LA Vol (A4C):   66.2 ml 30.15 ml/m LA Biplane Vol: 69.6 ml 31.70 ml/m  AORTIC VALVE                    PULMONIC VALVE AV Area (Vmax):    1.65 cm     PR End Diast Vel: 8.18 msec AV Area (Vmean):   1.70 cm AV Area (VTI):     1.55 cm AV Vmax:           134.38 cm/s AV Vmean:          82.392 cm/s AV VTI:            0.326 m AV Peak Grad:      7.2 mmHg AV Mean Grad:      3.3 mmHg LVOT Vmax:         70.40 cm/s LVOT Vmean:        44.700 cm/s LVOT VTI:          0.161 m LVOT/AV VTI ratio: 0.49  AORTA Ao Root diam: 3.00 cm Ao Asc diam:  3.40 cm MITRAL VALVE               TRICUSPID VALVE MV Area (PHT): 3.34 cm    TR Peak grad:   42.0 mmHg MV Decel Time: 227 msec    TR Vmax:        324.00 cm/s MV E velocity: 71.70 cm/s MV A velocity: 89.30 cm/s  SHUNTS MV E/A ratio:  0.80        Systemic VTI:  0.16 m                            Systemic Diam: 2.00 cm Dorn Ross MD Electronically signed by Dorn Ross MD Signature Date/Time: 07/20/2024/5:26:07 PM    Final    DG CHEST PORT 1  VIEW Result Date: 07/20/2024 CLINICAL DATA:  Ventilator dependence. EXAM: PORTABLE CHEST 1 VIEW COMPARISON:  07/19/2024 FINDINGS: Endotracheal tube tip is 1.4 cm above the base of the carina. The NG tube passes into the stomach although the distal tip position is not included on the film. The cardio pericardial silhouette is enlarged. There is pulmonary vascular congestion without overt pulmonary edema. No overt airspace edema or pleural effusion. IMPRESSION: 1. Endotracheal tube tip is 1.4 cm above the base of the carina. 2. Pulmonary vascular congestion without overt pulmonary edema. Electronically Signed   By: Camellia Candle M.D.   On: 07/20/2024 10:16   DG Abd Portable 1 View Result Date: 07/19/2024  EXAM: 1 VIEW XRAY OF THE ABDOMEN 07/19/2024 08:14:00 PM COMPARISON: None available. CLINICAL HISTORY: OG tube placement FINDINGS: LINES, TUBES AND DEVICES: Enteric tube in place with tip overlying the midbody of the stomach. BOWEL: Distended stomach with air. Nonobstructive bowel gas pattern. SOFT TISSUES: Overlapping cardiac leads and defibrillator pads noted. No abnormal calcifications. BONES: No acute fracture. IMPRESSION: 1. Enteric tube in satisfactory position. Electronically signed by: Norman Gatlin MD 07/19/2024 08:23 PM EST RP Workstation: [id number]   CT Cervical Spine Wo Contrast Result Date: 07/19/2024 CLINICAL DATA:  Neck trauma (Age >= 65y) EXAM: CT CERVICAL SPINE WITHOUT CONTRAST TECHNIQUE: Multidetector CT imaging of the cervical spine was performed without intravenous contrast. Multiplanar CT image reconstructions were also generated. RADIATION DOSE REDUCTION: This exam was performed according to the departmental dose-optimization program which includes automated exposure control, adjustment of the mA and/or kV according to patient size and/or use of iterative reconstruction technique. COMPARISON:  None Available. FINDINGS: Alignment: Straightening of normal lordosis. Trace anterolisthesis  of C3 on C4. No traumatic subluxation. Skull base and vertebrae: No acute fracture. Vertebral body heights are maintained. The dens and skull base are intact. Soft tissues and spinal canal: No canal hematoma. Pharyngeal fluid may be related to intubation. There is patchy soft tissue gas within the left parapharyngeal and submandibular space with ill-defined fat planes. Disc levels: Mild diffuse degenerative disc disease. Mild multilevel facet hypertrophy. Upper chest: Assessed on concurrent chest CT, reported separately. Other: Carotid calcifications.  Patient is intubated. IMPRESSION: 1. No acute fracture or traumatic subluxation of the cervical spine. 2. Patchy soft tissue gas within the left parapharyngeal and submandibular space with ill-defined fat planes. This is of uncertain etiology, recommend correlation for laceration. No evidence of facial bone fracture to account for soft tissue gas. If there is clinical concern for infection, consider further assessment with soft tissue neck CT. Electronically Signed   By: Andrea Gasman M.D.   On: 07/19/2024 20:04   CT Angio Chest/Abd/Pel for Dissection W and/or Wo Contrast Result Date: 07/19/2024 EXAM: CTA CHEST, ABDOMEN AND PELVIS WITHOUT AND WITH CONTRAST 07/19/2024 07:45:09 PM TECHNIQUE: CTA of the chest was performed without and with the administration of 100 mL of intravenous iohexol  (OMNIPAQUE ) 350 MG/ML injection. CTA of the abdomen and pelvis was performed with the administration of 100 mL of intravenous iohexol  (OMNIPAQUE ) 350 MG/ML injection. Multiplanar reformatted images are provided for review. MIP images are provided for review. Automated exposure control, iterative reconstruction, and/or weight based adjustment of the mA/kV was utilized to reduce the radiation dose to as low as reasonably achievable. COMPARISON: Same day x-ray. CLINICAL HISTORY: Acute aortic syndrome (AAS) suspected. Unresponsiveness. Bradycardia. FINDINGS: VASCULATURE: AORTA: No  acute aortic syndrome. Coronary artery and aortic atherosclerotic calcifications. No abdominal aortic aneurysm. No dissection. Reflux of contrast into the IVC and hepatic veins compatible with elevated right heart pressures. PULMONARY ARTERIES: Negative for pulmonary embolism. Dilated main pulmonary artery measuring 34 mm which can be seen with pulmonary hypertension. GREAT VESSELS OF AORTIC ARCH: No acute finding. No dissection. No arterial occlusion or significant stenosis. CELIAC TRUNK: No acute finding. No occlusion or significant stenosis. SUPERIOR MESENTERIC ARTERY: No acute finding. No occlusion or significant stenosis. INFERIOR MESENTERIC ARTERY: No acute finding. No occlusion or significant stenosis. RENAL ARTERIES: No acute finding. No occlusion or significant stenosis. ILIAC ARTERIES: No acute finding. No occlusion or significant stenosis. CHEST: MEDIASTINUM: Cardiomegaly. No mediastinal lymphadenopathy. No pericardial effusion. LUNGS AND PLEURA: Endotracheal tube tip at the origin of the right mainstem  bronchus at the level of the carina. Recommend retraction. Bronchovascular consolidation in the left lower lobe suspicious for aspiration or pneumonia. Scarring and atelectasis in the right lower lobe. No evidence of pleural effusion or pneumothorax. THORACIC BONES AND SOFT TISSUES: Body wall anasarca. No acute bone or soft tissue abnormality. ABDOMEN AND PELVIS: LIVER: No acute. GALLBLADDER AND BILE DUCTS: Gallbladder is unremarkable. No biliary ductal dilatation. SPLEEN: The spleen is unremarkable. PANCREAS: The pancreas is unremarkable. ADRENAL GLANDS: Bilateral adrenal glands demonstrate no acute abnormality. KIDNEYS, URETERS AND BLADDER: No stones in the kidneys or ureters. No hydronephrosis. No perinephric or periureteral stranding. Diffuse bladder wall thickening with perivesical stranding and fluid. Correlate for cystitis. GI AND BOWEL: Colonic diverticulosis without evidence of diverticulitis.  Normal appendix. Stomach and duodenal sweep demonstrate no acute abnormality. There is no bowel obstruction. No abnormal bowel wall thickening or distension. REPRODUCTIVE: Reproductive organs are unremarkable. PERITONEUM AND RETROPERITONEUM: Small volume abdominal or pelvic ascites. No free air. LYMPH NODES: No lymphadenopathy. ABDOMINAL BONES AND SOFT TISSUES: Body wall anasarca. No acute abnormality of the bones. No acute soft tissue abnormality. IMPRESSION: 1. Endotracheal tube tip at the origin of the right mainstem bronchus at the level of the carina. Recommend retraction. 2. No acute aortic syndrome or pulmonary embolism . 3. Cardiomegaly with signs heart failure and of 3rd spacing of fluid including body wall anasarca, abdominal pelvic ascites, and reflux with contrast into the IVC and hepatic veins. 4. Dilated main pulmonary artery measuring 34 mm which can be seen with pulmonary hypertension. 5. Bronchovascular consolidation in the left lower lobe suspicious for aspiration or pneumonia. 6. Diffuse bladder wall thickening with perivesical stranding and fluid. Correlate for cystitis. Electronically signed by: Norman Gatlin MD 07/19/2024 08:04 PM EST RP Workstation: [id number]   CT Head Wo Contrast Result Date: 07/19/2024 CLINICAL DATA:  Provided history: Head trauma, minor (Age >= 65y) EXAM: CT HEAD WITHOUT CONTRAST TECHNIQUE: Contiguous axial images were obtained from the base of the skull through the vertex without intravenous contrast. RADIATION DOSE REDUCTION: This exam was performed according to the departmental dose-optimization program which includes automated exposure control, adjustment of the mA and/or kV according to patient size and/or use of iterative reconstruction technique. COMPARISON:  None Available. FINDINGS: Brain: No intracranial hemorrhage, mass effect, or midline shift. Brain volume is normal for age. No hydrocephalus. The basilar cisterns are patent. Periventricular and deep white  matter hypodensity, nonspecific but typical of chronic small vessel ischemia. Faint symmetric basal gangliar mineralization. No evidence of territorial infarct or acute ischemia. No extra-axial or intracranial fluid collection. Vascular: Atherosclerosis of skullbase vasculature without hyperdense vessel or abnormal calcification. Skull: No fracture or focal lesion. Sinuses/Orbits: Small mucous retention cyst in the right maxillary sinus. Bilateral cataract resection Other: None. IMPRESSION: 1. No acute intracranial abnormality. No skull fracture. 2. Chronic small vessel ischemia. Electronically Signed   By: Andrea Gasman M.D.   On: 07/19/2024 19:58   DG Chest Portable 1 View Result Date: 07/19/2024 CLINICAL DATA:  Chest pain. EXAM: PORTABLE CHEST 1 VIEW COMPARISON:  Chest radiograph dated 08/09/2023. FINDINGS: Endotracheal tube approximately 2 cm above the carina. Shallow inspiration. There is cardiomegaly. No focal consolidation, pleural effusion, pneumothorax. No acute osseous pathology. IMPRESSION: 1. Endotracheal tube above the carina. 2. Cardiomegaly. Electronically Signed   By: Vanetta Chou M.D.   On: 07/19/2024 19:17    PMH:   Past Medical History:  Diagnosis Date   Allergic rhinitis    Asthma    Atypical chest pain  a. 03/2014: normal nuclear stress test.   Chronic diastolic CHF (congestive heart failure) (HCC)    a. Dx 03/2014 with echo - moderate LVH, EF 55-60%, grade 1 diastolic dysfunction, mildly dilated LA.   Chronic diastolic heart failure (HCC) 03/07/2021   CKD (chronic kidney disease), stage II    Diabetes mellitus type II    GERD (gastroesophageal reflux disease)    Headache(784.0)    when my bp is up   Hypertension    Hypertensive heart disease    Microcytic anemia    OSA (obstructive sleep apnea)    severe with AHI 77/hr with nocturnal hypoxemia   Osteoarthritis    Right upper quadrant pain 06/04/2022    PSH:   Past Surgical History:  Procedure Laterality  Date   RIGHT HEART CATH N/A 10/25/2023   Procedure: RIGHT HEART CATH;  Surgeon: Zenaida Morene PARAS, MD;  Location: [facility] INVASIVE CV LAB;  Service: Cardiovascular;  Laterality: N/A;   SHOULDER ARTHROSCOPY Bilateral    TUBAL LIGATION      Allergies: Allergies[1]  Medications:   Prior to Admission medications  Medication Sig Start Date End Date Taking? Authorizing Provider  amLODipine  (NORVASC ) 10 MG tablet Take 1 tablet (10 mg total) by mouth daily. 10/25/23  Yes Raford Riggs, MD  atorvastatin  (LIPITOR) 20 MG tablet Take 1 tablet (20 mg total) by mouth daily. 10/25/23  Yes Raford Riggs, MD  Dulaglutide  (TRULICITY ) 1.5 MG/0.5ML SOAJ Inject 1.5 mg into the skin once a week. 03/22/24  Yes   furosemide  (LASIX ) 80 MG tablet Take 1 tablet (80 mg total) by mouth 2 (two) times daily. You may take an extra 40mg  (1/2 tablet) by mouth daily as needed for swelling. 11/08/23  Yes Clegg, Amy D, NP  hydrALAZINE  (APRESOLINE ) 100 MG tablet Take 1 tablet (100 mg total) by mouth 3 (three) times daily. 04/11/24  Yes Raford Riggs, MD  insulin  glargine (LANTUS  SOLOSTAR) 100 UNIT/ML Solostar Pen Inject 45 Units into the skin in the morning. 03/01/24  Yes   insulin  lispro (HUMALOG ) 100 UNIT/ML KwikPen Use 3 times daily as directed. Max daily dose is 50 units. Patient taking differently: Inject 2 Units into the skin 3 (three) times daily between meals as needed (per sliding scale). 06/07/24  Yes   isosorbide  mononitrate (IMDUR ) 120 MG 24 hr tablet Take 1 tablet (120 mg total) by mouth daily. 10/25/23  Yes Zenaida Morene PARAS, MD  blood glucose meter kit and supplies Use 1 each in the morning, at noon, and at bedtime. Patient not taking: Reported on 07/20/2024 05/26/24   Almarie Waddell NOVAK, NP  Continuous Glucose Sensor (DEXCOM G7 SENSOR) MISC Use as directed; Place 1 sensor every 10 days 05/26/24     Dulaglutide  (TRULICITY ) 1.5 MG/0.5ML SOAJ Inject 1.5 mg into the skin once a week. Patient not taking: Reported on  07/20/2024 05/09/24     insulin  aspart (NOVOLOG  FLEXPEN) 100 UNIT/ML FlexPen Continuing sliding scale as previously prescribed. Patient not taking: Reported on 07/20/2024 10/05/23   Goodrich, Callie E, PA-C  insulin  glargine (LANTUS  SOLOSTAR) 100 UNIT/ML Solostar Pen Inject 35 units into the skin every morning. Patient not taking: Reported on 07/20/2024 05/09/24     Insulin  Pen Needle 32G X 4 MM MISC 1 Device by Does not apply route in the morning, at noon, in the evening, and at bedtime. 12/10/22   Shamleffer, Ibtehal Jaralla, MD    Discontinued Meds:   Medications Discontinued During This Encounter  Medication Reason   pantoprazole  (PROTONIX ) injection  40 mg    atropine  1 MG/10ML injection 1 mg    blood glucose meter kit and supplies One time medication   moxifloxacin  (VIGAMOX ) 0.5 % ophthalmic solution Completed Course   prednisoLONE  acetate (PRED FORTE ) 1 % ophthalmic suspension Completed Course   insulin  lispro (HUMALOG  KWIKPEN) 100 UNIT/ML KwikPen Duplicate   famotidine  (PEPCID ) tablet 20 mg    propofol  (DIPRIVAN ) 1000 MG/100ML infusion    cefTRIAXone  (ROCEPHIN ) 1 g in sodium chloride  0.9 % 100 mL IVPB    insulin  aspart (novoLOG ) injection 0-9 Units    atropine  1 MG/10ML injection 1 mg    fentaNYL  (SUBLIMAZE ) injection 25-50 mcg    furosemide  (LASIX ) 120 mg in dextrose  5 % 50 mL IVPB    famotidine  (PEPCID ) tablet 10 mg    hydrALAZINE  (APRESOLINE ) tablet 100 mg    Oral care mouth rinse    Oral care mouth rinse    furosemide  (LASIX ) 120 mg in dextrose  5 % 50 mL IVPB    fentaNYL  in NS (82mcg/ml) infusion-PREMIX    fentaNYL  (SUBLIMAZE ) bolus via infusion 25-100 mcg    hydrALAZINE  (APRESOLINE ) tablet 100 mg    isosorbide  mononitrate (IMDUR ) 24 hr tablet 120 mg     Social History:  reports that she quit smoking about 13 years ago. Her smoking use included cigarettes. She has never used smokeless tobacco. She reports that she does not drink alcohol and does not use  drugs.  Family History:   Family History  Problem Relation Age of Onset   Heart attack Mother    Hypertension Mother    Cancer Father    Hypertension Brother    Hypertension Maternal Grandmother    Hypertension Maternal Grandfather    Diabetes Other    Hypertension Other    Stroke Other    Cancer Other        colon; prostate   Kidney disease Neg Hx    Alcohol abuse Neg Hx    Asthma Neg Hx    COPD Neg Hx    Depression Neg Hx    Drug abuse Neg Hx    Early death Neg Hx    Hearing loss Neg Hx    Heart disease Neg Hx    Hyperlipidemia Neg Hx     Blood pressure (!) 133/54, pulse 70, temperature 98.1 F (36.7 C), resp. rate 17, height 5' (1.524 m), weight 133 kg, last menstrual period 02/23/2012, SpO2 99%. General appearance: alert, cooperative, and appears stated age Head: Normocephalic, without obvious abnormality, atraumatic Eyes: negative Neck: no adenopathy, no carotid bruit, supple, symmetrical, trachea midline, and thyroid  not enlarged, symmetric, no tenderness/mass/nodules Back: symmetric, no curvature. ROM normal. No CVA tenderness. Resp: Crackles on rotation Cardio: regular rate and rhythm GI: soft, non-tender; bowel sounds normal; no masses,  no organomegaly Extremities: edema 2+ LE Pulses: 2+ and symmetric Neurologic: Grossly normal       Gotham Raden, LYNWOOD ORN, MD 07/21/2024, 8:40 AM      [1]  Allergies Allergen Reactions   Latex Itching and Rash   Lisinopril Other (See Comments)    Headache

## 2024-07-21 NOTE — Plan of Care (Signed)
" °  Problem: Education: Goal: Ability to describe self-care measures that may prevent or decrease complications (Diabetes Survival Skills Education) will improve Outcome: Progressing Goal: Individualized Educational Video(s) Outcome: Progressing   Problem: Coping: Goal: Ability to adjust to condition or change in health will improve Outcome: Progressing   Problem: Fluid Volume: Goal: Ability to maintain a balanced intake and output will improve Outcome: Progressing   Problem: Metabolic: Goal: Ability to maintain appropriate glucose levels will improve Outcome: Progressing   Problem: Nutritional: Goal: Maintenance of adequate nutrition will improve Outcome: Progressing Goal: Progress toward achieving an optimal weight will improve Outcome: Progressing   Problem: Skin Integrity: Goal: Risk for impaired skin integrity will decrease Outcome: Progressing   Problem: Tissue Perfusion: Goal: Adequacy of tissue perfusion will improve Outcome: Progressing   Problem: Education: Goal: Knowledge of General Education information will improve Description: Including pain rating scale, medication(s)/side effects and non-pharmacologic comfort measures Outcome: Progressing   Problem: Clinical Measurements: Goal: Ability to maintain clinical measurements within normal limits will improve Outcome: Progressing Goal: Will remain free from infection Outcome: Progressing Goal: Diagnostic test results will improve Outcome: Progressing Goal: Respiratory complications will improve Outcome: Progressing Goal: Cardiovascular complication will be avoided Outcome: Progressing   Problem: Activity: Goal: Risk for activity intolerance will decrease Outcome: Progressing   Problem: Coping: Goal: Level of anxiety will decrease Outcome: Progressing   Problem: Pain Managment: Goal: General experience of comfort will improve and/or be controlled Outcome: Progressing   Problem: Skin Integrity: Goal:  Risk for impaired skin integrity will decrease Outcome: Progressing   Problem: Safety: Goal: Ability to remain free from injury will improve Outcome: Progressing   "

## 2024-07-21 NOTE — Significant Event (Signed)
 PCCM interim progress note:   Pt rapidly declined overnight, initially encephalopathic and increasingly hypotensive on three pressors with rising lactic acid and worsening acidosis despite maximal support.  Minimal EF on POCUS, Spoke with cardiology who confirmed with HF team that patient was not a candidate for ECMO or other cardiac support devices.  Despite maximal pressors and epi gtt pt briefly lost pulses.   Discussed pt's course with family members multiple times and they decided to transition to DNR and comfort care.    Lori SAUNDERS Ladesha Pacini, PA-C'

## 2024-07-21 NOTE — Progress Notes (Signed)
 I was notified by the critical care team that the patient is having escalating doses of pressors and is undergoing intubation the setting of worsening encephalopathy.  Lactate was >9.  She had been started on CRRT for volume control earlier in the day; however, given hypotension, team was not able to pull fluid.  When I examined the patient, she was intubated, on 38 mcg/min of levophed  and 0.5 mcg/min of epinephrine . Given the worsening clinical status, I discussed with Dr. Bensimohn candidacy for MCS, and given her BMI, poor baseline functional capacity, and her current kidney disease, she's not candidate for MCS escalation. Discussed this with the critical care team as well as continuing to support her with vasopressors, obtaining co-ox, and resuming fluid pull on CRRT when clinical status allows.

## 2024-07-21 NOTE — Progress Notes (Signed)
 Heart Failure Navigator Progress Note  Assessed for Heart & Vascular TOC clinic readiness.  Patient does not meet criteria due to the Advanced Heart Failure team was consulted. .   Navigator will sign off at this time.  Stephane Haddock, BSN, Scientist, Clinical (histocompatibility And Immunogenetics) Only

## 2024-07-21 NOTE — Progress Notes (Signed)
 Pharmacy Antibiotic Note  Lori Mclaughlin is a 63 y.o. female admitted on 07/19/2024 with sepsis, pna.  Pharmacy has been consulted for Vancomycin  and Cefepime  dosing. Pt continues on CRRT. Pt has been on Rocephin  and Azithromycin  for PNA.  Plan: Cefepime  2gm IV q8h Vancomycin  2000 mg IV now. Consider random level in ~24h to help guide further dosing Will f/u CRRT tolerance, micro data, and pt's clinical condition   Height: 5' (152.4 cm) Weight: 133 kg (293 lb 3.4 oz) IBW/kg (Calculated) : 45.5  Temp (24hrs), Avg:97.4 F (36.3 C), Min:96.4 F (35.8 C), Max:98.2 F (36.8 C)  Recent Labs  Lab 07/19/24 1852 07/19/24 1904 07/19/24 1905 07/19/24 2239 07/20/24 0305 07/20/24 1052 07/20/24 1259 07/21/24 0016 07/21/24 0413 07/21/24 1600 07/21/24 2031  WBC 7.8  --   --  7.1 6.4  --   --  11.8*  --   --   --   CREATININE 3.07*   < >  --  2.95* 3.00*  --   --  3.73* 3.88* 4.36*  --   LATICACIDVEN  --   --  5.1*  --   --  1.6 1.5  --   --   --  >9.0*   < > = values in this interval not displayed.    Estimated Creatinine Clearance: 16.3 mL/min (A) (by C-G formula based on SCr of 4.36 mg/dL (H)).    Allergies[1]  Antimicrobials this admission: 12/24 Rocephin [place] >>12/26 12/26 Vanc >>  12/26 Cefepime  >>   Microbiology results: 12/26 BCx:  Trach cx 12/24 MRSA PCR: neg  Thank you for allowing pharmacy to be a part of this patients care.  Vito Ralph, PharmD, BCPS Please see amion for complete clinical pharmacist phone list 07/21/2024 10:10 PM     [1]  Allergies Allergen Reactions   Latex Itching and Rash   Lisinopril Other (See Comments)    Headache

## 2024-07-21 NOTE — Progress Notes (Addendum)
 "    Advanced Heart Failure Rounding Note  Cardiologist: Annabella Scarce, MD  [facility] Cardiologist: Dr. Cherrie Chief Complaint: RVF Patient Profile   Lori Mclaughlin is a 63 y.o. female with chronic HFrEF, group 2 pulmonary hypertension, hypertension, hyperlipidemia, type 2 diabetes, CKD, asthma, GERD, anemia, OSA.  Significant events:   12/25: milrinone  and IV lasix  gtt for poor UOP. Echo with worsening RVF (severe), EF 35-40%  Subjective:    Near anuric on Lasix  gtt with worsening Cr. Hypotensive this morning. Extubated yesterday to [place], now on BiPAP this morning.   Objective:    Weight Range: 133 kg Body mass index is 57.26 kg/m.   Vital Signs:   Temp:  [97.7 F (36.5 C)-99 F (37.2 C)] 98.1 F (36.7 C) (12/26 0600) Pulse Rate:  [41-80] 70 (12/26 0600) Resp:  [0-22] 17 (12/26 0600) BP: (103-181)/(48-122) 133/54 (12/26 0600) SpO2:  [96 %-100 %] 99 % (12/26 0600) FiO2 (%):  [40 %] 40 % (12/25 2310) Weight:  [866 kg] 133 kg (12/26 0358) Last BM Date :  (PTA)  Weight change: Filed Weights   07/20/24 0000 07/20/24 0500 07/21/24 0358  Weight: 132.9 kg 132.9 kg 133 kg   Intake/Output:  Intake/Output Summary (Last 24 hours) at 07/21/2024 0718 Last data filed at 07/21/2024 0418 Gross per 24 hour  Intake 740.33 ml  Output 160 ml  Net 580.33 ml    Physical Exam   General: Critically-ill appearing.   Cor: Regular rate & rhythm. No murmurs. JVD to jaw cm.  Lungs: crackles Extremities: 2+ edema up to thighs  Telemetry   SR 80s with LBBB and frequent PVCs  Labs   CBC Recent Labs    07/19/24 1852 07/19/24 1904 07/20/24 0305 07/20/24 0353 07/21/24 0016  WBC 7.8   < > 6.4  --  11.8*  NEUTROABS 6.2  --   --   --   --   HGB 9.8*   < > 8.3* 9.5* 9.8*  HCT 32.4*   < > 26.7* 28.0* 31.0*  MCV 74.0*   < > 72.8*  --  71.6*  PLT 196   < > 118*  --  173   < > = values in this interval not displayed.   Basic Metabolic Panel Recent Labs    87/74/74 0305  07/20/24 0353 07/21/24 0016 07/21/24 0413  NA 141   < > 140 141  K 4.1   < > 4.7 4.2  CL 109  --  106 106  CO2 21*  --  20* 20*  GLUCOSE 205*  --  122* 126*  BUN 46*  --  51* 52*  CREATININE 3.00*  --  3.73* 3.88*  CALCIUM  8.4*  --  9.1 9.1  MG 2.0  --  2.2 2.2  PHOS 4.8*  --  5.1*  --    < > = values in this interval not displayed.   Liver Function Tests Recent Labs    07/19/24 1852 07/21/24 0413  AST 59* 38  ALT 28 24  ALKPHOS 246* 206*  BILITOT 1.1 0.9  PROT 7.8 7.2  ALBUMIN 3.8 3.6   BNP (last 3 results) Recent Labs    09/27/23 2122 10/11/23 1023 11/08/23 1419  BNP 1,677.7* 1,010.0* 960.6*   ProBNP (last 3 results) Recent Labs    07/19/24 1852  PROBNP 23,439.0*   Hemoglobin A1C Recent Labs    07/19/24 2239  HGBA1C 7.2*   Fasting Lipid Panel Recent Labs    07/20/24 0305  TRIG 68   Medications:    Scheduled Medications:  Chlorhexidine  Gluconate Cloth  6 each Topical Daily   famotidine   10 mg Oral Daily   heparin   5,000 Units Subcutaneous Q8H   hydrALAZINE   100 mg Oral TID   insulin  aspart  0-15 Units Subcutaneous Q4H   isosorbide  mononitrate  120 mg Oral Daily   mouth rinse  15 mL Mouth Rinse 4 times per day    Infusions:  azithromycin  Stopped (07/20/24 2055)   cefTRIAXone  (ROCEPHIN )  IV Stopped (07/20/24 1001)   furosemide  (LASIX ) 200 mg in dextrose  5 % 100 mL (2 mg/mL) infusion 15 mg/hr (07/21/24 0615)   milrinone  0.25 mcg/kg/min (07/21/24 0418)    PRN Medications: artificial tears, atropine , hydrALAZINE , ondansetron  (ZOFRAN ) IV, mouth rinse  Assessment/Plan   1. Acute on chronic HFrEF, Primarily RV failure, pHTN - 3/25 EF 35-40%, mod reduced RV and RHC with severely elelvated filling pressues, and group 2 pHTN.  - Echo 12/25 with worsening RV function, now severely reduced. EF 35-40% - ischemic eval has not been completed previously d/t CKD - poor UOP with IV lasix , started on milrinone  0.25 mcg/kg/min; continue - co-ox 75%; near  anuric on lasix  15/hr - rebolus lasix  80 mg, increase gtt to 20/hr, give metolazone  5 mg once - will reach out to nephrology; suspect will need CVVHD - decrease hydral to 50 mg tid and imdur  to 60 mg; hold if BP sags  2. Syncope, Bradycardia - HR as low as 20s on EMS arrival - s/p atropine  - ECG overnight SB 58 bpm; NSR this am - on tele   3. AKI on CKD - baseline Cr 1.4 - Cr up to 3.88; oliguric to anuric - diuretic plan as above - consult to nephology  4. NSTEMI - hs-trops 519>530 - suspect demand ischemic - ECG without STE or ischemic changes - no chest pain  5. PVCs - on tele - in the setting of volume overload and RVF  6. Obesity, OSA - Body mass index is 57.26 kg/m. - on CPAP HS  Length of Stay: 2  CRITICAL CARE Performed by: Jordan Lee  Total critical care time: 13 minutes  -Critical care time was exclusive of separately billable procedures and treating other patients. -Critical care was necessary to treat or prevent imminent or life-threatening deterioration. -Critical care was time spent personally by me on the following activities: development of treatment plan with patient and/or surrogate as well as nursing, discussions with consultants, evaluation of patient's response to treatment, examination of patient, obtaining history from patient or surrogate, ordering and performing treatments and interventions, ordering and review of laboratory studies, ordering and review of radiographic studies, pulse oximetry and re-evaluation of patient's condition.  Jordan Lee, NP  07/21/2024, 7:18 AM  Advanced Heart Failure Team Pager [phone] (M-F; 7a - 5p)   Please visit [email or web address]: For overnight coverage please call cardiology fellow first. If fellow not available call Shock/ECMO MD on call.  For ECMO / Mechanical Support (Impella, IABP, LVAD) issues call Shock / ECMO MD on call.   Agree with above.   No response to milrinone  and IV lasix . More SOB and  uncomfortable. Renal function worse. Co-ox 75%  General:  Sitting up in bed. + SOB HEENT: normal Neck: supple. JVP to jaw  Cor: Regular rate & rhythm. No rubs, gallops or murmurs. Lungs: + crackles Abdomen: obese soft, nontender, +distended.Good bowel sounds. Extremities: no cyanosis, clubbing, rash, 3+ edema Neuro: alert & orientedx3, cranial nerves grossly intact. moves  all 4 extremities w/o difficulty. Affect pleasant  She is markedly fluid overloaded with severe RV failure. Now with AKI.   Will need CRRT for fluid removal.   D/w CCM at bedside.   CRITICAL CARE Performed by: Cherrie Sieving  Total critical care time: 41 minutes  Critical care time was exclusive of separately billable procedures and treating other patients.  Critical care was necessary to treat or prevent imminent or life-threatening deterioration.  Critical care was time spent personally by me (independent of midlevel providers or residents) on the following activities: development of treatment plan with patient and/or surrogate as well as nursing, discussions with consultants, evaluation of patient's response to treatment, examination of patient, obtaining history from patient or surrogate, ordering and performing treatments and interventions, ordering and review of laboratory studies, ordering and review of radiographic studies, pulse oximetry and re-evaluation of patient's condition.  Sieving Cherrie, MD  5:37 PM    "

## 2024-07-21 NOTE — Progress Notes (Signed)
 RT attempted A-line with no success. Pt still with inconsistent Blood pressures. Informed MD on call Dr. Kassie. Night shift Team to place A-Line.

## 2024-07-21 NOTE — Progress Notes (Signed)
 PT Cancellation Note  Patient Details Name: Lori Mclaughlin MRN: [id number] DOB: Oct 27, 1960   Cancelled Treatment:    Reason Eval/Treat Not Completed: Fatigue/lethargy limiting ability to participate  Spoke with RN. Pt recently underwent CVC placement. Lethargic. Recommended follow-up another time. Will check back tomorrow for PT evaluation.  Lori Mclaughlin, PT, DPT [facility]  Rehabilitation Services Physical Therapist Office: [phone] Website: [email or web address]  Lori Mclaughlin 07/21/2024, 2:36 PM

## 2024-07-21 NOTE — Progress Notes (Signed)
 "  NAME:  Lori Mclaughlin, MRN:  [id number], DOB:  10-31-1960, LOS: 2 ADMISSION DATE:  07/19/2024, CONSULTATION DATE:  07/19/2024 REFERRING MD:  Ozell Marine, DO, CHIEF COMPLAINT:  SOB  History of Present Illness:  63 y/o female with PMH for DMT2 on insulin , CKD,  HTN, Severe OSA, Morbid Obesity and HLD who presented from home after collapsing at the top of the stairs.  Apparently she was being pushed up the stairs by the family and became SOB.  She apparently collapesd and became unresponsive.  EMS arrived to find her hypertensive and bradycardiac as low as HR 20s.  She was intubated with 5mg  Versed .  In the ED she was following commands neurologically.  Labs show LA 5.1, Cr 3, BNP 23k,  WBC 7.4.  CT chest showing posterior consolidations, pneumonia vs dependent atelectasis.  Pertinent  Medical History  DMT2 on insulin , HTN, Severe OSA, Morbid Obesity and HLD  Significant Hospital Events: Including procedures, antibiotic start and stop dates in addition to other pertinent events   12/24: admit to ICU 12/25 Extubated 12/26 Started on milrinone  and lasix  gtt due to minimum UOP however remains oliguric  Interim History / Subjective:  Extubated yesterday Intermittent bradycardia to 40s for ~30 min that resolved without intervention Tolerated BiPAP overnight Started on milrinone  and lasix  gtt due to minimum UOP however remains oliguric  Objective    Blood pressure (!) 133/54, pulse 70, temperature 98.1 F (36.7 C), resp. rate 17, height 5' (1.524 m), weight 133 kg, last menstrual period 02/23/2012, SpO2 99%.    Vent Mode: PSV;CPAP FiO2 (%):  [40 %] 40 % PEEP:  [5 cmH20] 5 cmH20 Pressure Support:  [5 cmH20] 5 cmH20   Intake/Output Summary (Last 24 hours) at 07/21/2024 0909 Last data filed at 07/21/2024 0418 Gross per 24 hour  Intake 718.89 ml  Output 160 ml  Net 558.89 ml   Filed Weights   07/20/24 0000 07/20/24 0500 07/21/24 0358  Weight: 132.9 kg 132.9 kg 133 kg    Physical Exam: General: Well-appearing, no acute distress HENT: Bud, AT, ETT in place Eyes: EOMI, no scleral icterus Respiratory: Diminished but clear to auscultation bilaterally.  No crackles, wheezing or rales Cardiovascular: Bradycardic, RR, -M/R/G, no JVD GI: BS+, soft, nontender Extremities: Pedal edema, 2+,-tenderness Neuro: Opens eyes to voice, follows commands GU: Foley in place  Imaging, labs and test in EMR in the last 24 hours reviewed independently by me. Pertinent findings below: CXR 12/25 LLL consolidation. ETT 2 cm above carina  Resolved problem list   Assessment and Plan   Acute metabolic encephalopathy: Mild uremia could be contributing PAD protocol RASS -1 Re-orient as able  Acute hypoxemic respiratory failure secondary to pneumonia: neg covid/flu/RSV Severe OSA on CPAP -Extubated 12/25 -Continue CAP: Ceftriaxone  and azithro. End 12/28 for five days  Bradycardia with altered mental status: Intermittent but improved Reported episodes as low was 20s but hemodynamically stable. No significant BB/CCB on board. Amlodipine  however would not expect this level of hypotension even in setting of AKI. Patient reported unwitnessed LOC ~ month ago preceded by dizziness -Tele -PRN atropine  for HR <40 -Appreciate Cardiology input. 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 Diurese per team recs. Repeat lasix  bolus and gtt increased and metolazone  added -Hold antihypertensives with plan to optimize volume removal  Chronic  anemia Thrombocytopenia: New, may be due to acute illness -Trend -Transfuse Hg >7 and Plt >10k  AoCKD IIIB  -Nephrology consulted. Possible dialysis if not responsive to milrinone  and lasix  -Monitor UOP/Cr -Avoid  nephrotoxic agents  DMII -SSI -Will likely need basal when diet started  Obtained history and updated daughter at bedside. Addressed questions and concerns  Critical care time:    The patient is critically ill with multiple organ systems failure and requires high complexity decision making for assessment and support, frequent evaluation and titration of therapies, application of advanced monitoring technologies and extensive interpretation of multiple databases.  Independent Critical Care Time: 40 Minutes.   Slater Staff, M.D. [facility] Pulmonary/Critical Care Medicine 07/21/2024 9:09 AM   Please see Amion for pager number to reach on-call Pulmonary and Critical Care Team.      "

## 2024-07-21 NOTE — Progress Notes (Signed)
 eLink Physician-Brief Progress Note Patient Name: Lori Mclaughlin DOB: 1961/03/16 MRN: [id number]   Date of Service  07/21/2024  HPI/Events of Note  Notified of LA > 9. Seen on camera. Lethargic in BIPAP. Not really waking up quickly. Is on Amio, levophed  (30 mic) and bicarb drips. Bicarb was only recently started. Bedside had seen her and placed art line and ordered bicarb drip. CRRT started earlier with goal to pull fluid but this was recently stopped as BP is also too low to pull fluid. Has RV failure and episodes of bradycardia which are not new.   eICU Interventions  Add epinephrine  infusion Amio rate is being lowered now and we may have to stop it entirely if bradycardia continues to be an issue Discussed with PA Gleason who will also evaluate at bedside. I feel she is headed toward reintubation.      Intervention Category Major Interventions: Respiratory failure - evaluation and management;Arrhythmia - evaluation and management  Leonel Mccollum G [name] 07/21/2024, 10:05 PM

## 2024-07-21 NOTE — Progress Notes (Signed)
 Pt transitioned from BiPAP to 2L [place] and is tolerating well at this time.

## 2024-07-21 NOTE — Progress Notes (Deleted)
 PT Cancellation Note  Patient Details Name: Lori Mclaughlin MRN: [id number] DOB: 1961-05-26   Cancelled Treatment:    Reason Eval/Treat Not Completed: Patient at procedure or test/unavailable  Currently undergoing dialysis treatment. Will finish up around 12:30 per team. Will check back for PT evaluation after completing treatment.  Leontine Roads, PT, DPT [facility]  Rehabilitation Services Physical Therapist Office: [phone] Website: [email or web address]   Leontine GORMAN Roads 07/21/2024, 10:38 AM

## 2024-07-21 NOTE — Procedures (Signed)
 Arterial Catheter Insertion Procedure Note  Lori Mclaughlin  [id number]  12-Jun-1961  Date:07/21/2024  Time:9:33 PM    Provider Performing: Leita SAUNDERS [name]    Procedure: Insertion of Arterial Line (63379) with US  guidance (23062)   Indication(s) Blood pressure monitoring and/or need for frequent ABGs  Consent Unable to obtain consent due to emergent nature of procedure.  Anesthesia None   Time Out Verified patient identification, verified procedure, site/side was marked, verified correct patient position, special equipment/implants available, medications/allergies/relevant history reviewed, required imaging and test results available.   Sterile Technique Maximal sterile technique including full sterile barrier drape, hand hygiene, sterile gown, sterile gloves, mask, hair covering, sterile ultrasound probe cover (if used).   Procedure Description Area of catheter insertion was cleaned with chlorhexidine  and draped in sterile fashion. With real-time ultrasound guidance an arterial catheter was placed into the left axillary artery.  Appropriate arterial tracings confirmed on monitor.     Complications/Tolerance None; patient tolerated the procedure well.   EBL Minimal   Specimen(s) None   Leita SAUNDERS Taneisha Fuson, PA-C

## 2024-07-21 NOTE — Procedures (Signed)
 Central Venous Catheter Insertion Procedure Note  Lori Mclaughlin  [id number]  [birth year]  Date:07/21/2024  Time:2:31 PM   Provider Performing:Shirlean Berman E Angelynn Lemus   Procedure: Insertion of Non-tunneled Central Venous Catheter(36556)with US  guidance (23062)    Indication(s) Hemodialysis  Consent Risks of the procedure as well as the alternatives and risks of each were explained to the patient and/or caregiver.  Consent for the procedure was obtained and is signed in the bedside chart  Anesthesia Topical only with 1% lidocaine    Timeout Verified patient identification, verified procedure, site/side was marked, verified correct patient position, special equipment/implants available, medications/allergies/relevant history reviewed, required imaging and test results available.  Sterile Technique Maximal sterile technique including full sterile barrier drape, hand hygiene, sterile gown, sterile gloves, mask, hair covering, sterile ultrasound probe cover (if used).  Procedure Description Area of catheter insertion was cleaned with chlorhexidine  and draped in sterile fashion.   With real-time ultrasound guidance a HD catheter was placed into the right internal jugular vein.  Positioning of guidewire within right internal jugular vein was verified with ultrasound prior to dilation of vessel and placement of catheter. Nonpulsatile blood flow and easy flushing noted in all ports.  The catheter was sutured in place at 16cm and sterile dressing applied.  Complications/Tolerance None; patient tolerated the procedure well. Chest X-ray is ordered to verify placement for internal jugular or subclavian cannulation, and is pending   EBL Minimal  Specimen(s) None    Ronnald Gave MSN, AGACNP-BC [facility] Amion for pager  07/21/2024, 2:33 PM

## 2024-07-21 NOTE — Progress Notes (Signed)
 Date and time results received: 07/21/2024 2147 (use smartphrase .now to insert current time)  Test: Lactic Acid Critical Value: >9.0  Name of Provider Notified: Elink  Orders Received? Or Actions Taken?:

## 2024-07-21 NOTE — Progress Notes (Signed)
" °   07/21/24 2003  BiPAP/CPAP/SIPAP  BiPAP/CPAP/SIPAP Pt Type Adult  BiPAP/CPAP/SIPAP SERVO  Mask Type Full face mask  Dentures removed? Not applicable  Mask Size Large  Set Rate (S)  22 breaths/min  Respiratory Rate 24 breaths/min  IPAP 10 cmH20  EPAP 5 cmH2O  PEEP 5 cmH20  FiO2 (%) 40 %  Minute Ventilation 12.6  Leak 41  Peak Inspiratory Pressure (PIP) 10  Tidal Volume (Vt) 741  Patient Home Machine No  Patient Home Mask No  Patient Home Tubing No  Auto Titrate No  Press High Alarm 25 cmH2O  CPAP/SIPAP surface wiped down Yes  Device Plugged into RED Power Outlet Yes  Oxygen Percent 40 %  BiPAP/CPAP /SiPAP Vitals  Resp 16  SpO2 99 %  MEWS Score/Color  MEWS Score 5  MEWS Score Color Red    "

## 2024-07-22 MED ORDER — SODIUM CHLORIDE 0.9 % IV SOLN: INTRAVENOUS | Status: DC

## 2024-07-22 MED ORDER — ACETAMINOPHEN 650 MG RE SUPP: 650.0000 mg | Freq: Four times a day (QID) | RECTAL | Status: DC | PRN

## 2024-07-22 MED ORDER — ACETAMINOPHEN 325 MG PO TABS: 650.0000 mg | ORAL_TABLET | Freq: Four times a day (QID) | ORAL | Status: DC | PRN

## 2024-07-22 MED ORDER — GLYCOPYRROLATE 0.2 MG/ML IJ SOLN: 0.2000 mg | INTRAMUSCULAR | Status: DC | PRN

## 2024-07-22 MED ORDER — GLYCOPYRROLATE 1 MG PO TABS: 1.0000 mg | ORAL_TABLET | ORAL | Status: DC | PRN

## 2024-07-22 MED ORDER — GLYCOPYRROLATE 0.2 MG/ML IJ SOLN
0.2000 mg | INTRAMUSCULAR | Status: DC | PRN
  Administered 2024-07-22: 0.2 mg via INTRAVENOUS
  Filled 2024-07-22: qty 1

## 2024-07-22 MED ORDER — MORPHINE BOLUS VIA INFUSION
5.0000 mg | INTRAVENOUS | Status: DC | PRN
  Administered 2024-07-22: 5 mg via INTRAVENOUS

## 2024-07-22 MED ORDER — MORPHINE 100MG IN NS 100ML (1MG/ML) PREMIX INFUSION
0.0000 mg/h | INTRAVENOUS | Status: DC
  Administered 2024-07-22: 5 mg/h via INTRAVENOUS
  Filled 2024-07-22: qty 100

## 2024-07-24 ENCOUNTER — Other Ambulatory Visit (HOSPITAL_BASED_OUTPATIENT_CLINIC_OR_DEPARTMENT_OTHER): Payer: Self-pay

## 2024-07-24 ENCOUNTER — Other Ambulatory Visit: Payer: Self-pay

## 2024-07-27 NOTE — Progress Notes (Signed)
 Patient made comfort care by patients two daughters. CRRT stopped and patient extubated with family at bedside. Time of death called by this RN and Rhoda Mink, RN at [phone]. Family still at bedside. Patient placement card given for them to call when they know what funeral home they want to use. Will provide post mortem care when family leaves.

## 2024-07-27 NOTE — Progress Notes (Signed)
" °   2024/08/09 0031  Spiritual Encounters  Type of Visit Initial  Care provided to: [facility] partners present during encounter Nurse  Reason for visit Urgent spiritual support  OnCall Visit Yes   Responded to code blue. Provided care for the family. Family request doctor return to discuss new concerns.  "

## 2024-07-27 NOTE — Progress Notes (Signed)
" °   07-25-24 0143  Spiritual Encounters  Type of Visit Follow up  Care provided to: [facility] partners present during encounter Physician;Nurse  Reason for visit Urgent spiritual support  OnCall Visit Yes   Provided additional support to daughters. Daughters want a DNR. Also want patient moved to comfort care. Present with family while they discussed with doctor.   "

## 2024-07-27 NOTE — Procedures (Signed)
 Extubation Procedure Note  Patient Details:   Name: Lori Mclaughlin DOB: 08-05-1960 MRN: [id number]   Airway Documentation:    Vent end date: Jul 24, 2024 Vent end time: 0211   Evaluation  O2 sats: transiently fell during during procedure Complications: No apparent complications Patient did tolerate procedure well. Bilateral Breath Sounds: Rhonchi   No  Pt extubated to comfort care per CCM order. RN and family at bedside.  [name] 07/24/2024, 2:27 AM

## 2024-07-27 NOTE — Death Summary Note (Signed)
 " DEATH SUMMARY   Patient Details  Name: Lori Mclaughlin MRN: [id number] DOB: Jun 21, 1961  Admission/Discharge Information   Admit Date:  07-26-24  Date of Death: Date of Death: Jul 29, 2024  Time of Death: Time of Death: 0233  Length of Stay: 3  Referring Physician: Almarie Waddell NOVAK, NP   Reason(s) for Hospitalization  {Trauma:[id number]::***}  Diagnoses  Preliminary cause of death: Right heart failure Secondary Diagnoses (including complications and co-morbidities):  Principal Problem:   Acute hypoxic respiratory failure (HCC) Active Problems:   Acute on chronic heart failure [facility])   Brief Hospital Course (including significant findings, care, treatment, and services provided and events leading to death)  MELODIE Mclaughlin is a 64 y.o. year old female who ***    Pertinent Labs and Studies  Significant Diagnostic Studies Portable Chest x-ray Result Date: 07/21/2024 EXAM: 1 VIEW(S) XRAY OF THE CHEST 07/21/2024 11:06:00 PM COMPARISON: XR ABDOMEN dated 07/21/2024. CLINICAL HISTORY: Endotracheal tube present. FINDINGS: LINES, TUBES AND DEVICES: Right IJ sheath stable in position with tip overlying the superior vena cava. Enteric tube in place with side port and tip overlying the stomach. Endotracheal tube in place with tip 3.5 cm above carina. LUNGS AND PLEURA: Left retrocardiac airspace opacity. No right pleural effusion. Left pleural effusion not excluded. No pneumothorax. HEART AND MEDIASTINUM: Stable cardiomegaly. Atherosclerotic calcifications of aortic arch. BONES AND SOFT TISSUES: No acute osseous abnormality. IMPRESSION: 1. Left retrocardiac airspace opacity. 2. Line and tubes as above. Electronically signed by: Morgane Naveau MD 07/21/2024 11:40 PM EST RP Workstation: [id number]   DG Abd Portable 1V Result Date: 07/21/2024 EXAM: 1 VIEW XRAY OF THE ABDOMEN 07/21/2024 11:04:00 PM COMPARISON: XR ABD 07-26-24, CXR 07/21/2024. CLINICAL HISTORY: Encounter for orogastric  (OG) tube placement. FINDINGS: LINES, TUBES AND DEVICES: Enteric tube in place with side port and tip overlying the stomach. BOWEL: Nonobstructive bowel gas pattern. Right flank and pelvis collimated off view. SOFT TISSUES: No abnormal calcifications. Atherosclerotic plaque. BONES: No acute fracture. IMPRESSION: 1. Enteric tube in place with side port and tip overlying the stomach. 2. Nonobstructive bowel gas pattern. Electronically signed by: Morgane Naveau MD 07/21/2024 11:39 PM EST RP Workstation: [id number]   DG CHEST PORT 1 VIEW Result Date: 07/21/2024 CLINICAL DATA:  Central line placement EXAM: PORTABLE CHEST 1 VIEW COMPARISON:  Yesterday FINDINGS: Stable cardiomegaly. Endotracheal and nasogastric tubes have been removed. Interval placement of right internal jugular catheter with distal tip in expected position of the SVC. No definite pneumothorax is noted. IMPRESSION: Interval placement of right internal jugular catheter with distal tip in expected position of the SVC. Electronically Signed   By: Lynwood Landy Raddle M.D.   On: 07/21/2024 15:24   ECHOCARDIOGRAM COMPLETE Result Date: 07/20/2024    ECHOCARDIOGRAM REPORT   Patient Name:   Lori Mclaughlin Date of Exam: 07/20/2024 Medical Rec #:  [id number]              Height:       60.0 in Accession #:    [id number]             Weight:       293.0 lb Date of Birth:  18-May-1961              BSA:          2.195 m Patient Age:    64 years               BP:           147/82  mmHg Patient Gender: F                      HR:           60 bpm. Exam Location:  Inpatient Procedure: 2D Echo (Both Spectral and Color Flow Doppler were utilized during            procedure). Indications:    congestive heart failure  History:        Patient has prior history of Echocardiogram examinations, most                 recent 09/28/2023. Chronic kidney disease, Arrythmias:Bradycardia;                 Risk Factors:Hypertension, Dyslipidemia, Sleep Apnea and                  Diabetes.  Sonographer:    Tinnie Barefoot RDCS Referring Phys: [id number] SHENG L HALEY  Sonographer Comments: Patient is obese and echo performed with patient supine and on artificial respirator. Image acquisition challenging due to respiratory motion. IMPRESSIONS  1. Left ventricular ejection fraction, by estimation, is 35 to 40%. The left ventricle has moderately decreased function. The left ventricle demonstrates global hypokinesis. Left ventricular diastolic parameters are consistent with Grade I diastolic dysfunction (impaired relaxation). There is the interventricular septum is flattened in systole and diastole, consistent with right ventricular pressure and volume overload.  2. Right ventricular systolic function Moderately to severely reduced. The right ventricular size is moderately to severely enlarged. There is moderately elevated pulmonary artery systolic pressure.  3. Right atrial size was severely dilated.  4. The mitral valve is normal in structure. No evidence of mitral valve regurgitation. No evidence of mitral stenosis.  5. Hepatic systolic flow reversal suggestive of severe tricuspid regurgitation. . The tricuspid valve is abnormal. Tricuspid valve regurgitation is severe.  6. The aortic valve is tricuspid. Aortic valve regurgitation is not visualized. No aortic stenosis is present.  7. The inferior vena cava is dilated in size with <50% respiratory variability, suggesting right atrial pressure of 15 mmHg. FINDINGS  Left Ventricle: Left ventricular ejection fraction, by estimation, is 35 to 40%. The left ventricle has moderately decreased function. The left ventricle demonstrates global hypokinesis. The left ventricular internal cavity size was normal in size. There is no left ventricular hypertrophy. The interventricular septum is flattened in systole and diastole, consistent with right ventricular pressure and volume overload. Left ventricular diastolic parameters are consistent with Grade I  diastolic dysfunction (impaired relaxation). Right Ventricle: The right ventricular size is moderately to severely enlarged. Right vetricular wall thickness was not well visualized. Right ventricular systolic function Moderately to severely reduced. There is moderately elevated pulmonary artery systolic pressure. The tricuspid regurgitant velocity is 3.24 m/s, and with an assumed right atrial pressure of 15 mmHg, the estimated right ventricular systolic pressure is 57.0 mmHg. Left Atrium: Left atrial size was normal in size. Right Atrium: Right atrial size was severely dilated. Pericardium: There is no evidence of pericardial effusion. Mitral Valve: The mitral valve is normal in structure. There is mild thickening of the mitral valve leaflet(s). There is mild calcification of the mitral valve leaflet(s). Mild mitral annular calcification. No evidence of mitral valve regurgitation. No evidence of mitral valve stenosis. Tricuspid Valve: Hepatic systolic flow reversal suggestive of severe tricuspid regurgitation. The tricuspid valve is abnormal. Tricuspid valve regurgitation is severe. No evidence of tricuspid stenosis. Aortic Valve: The aortic valve is tricuspid. Aortic  valve regurgitation is not visualized. No aortic stenosis is present. Aortic valve mean gradient measures 3.3 mmHg. Aortic valve peak gradient measures 7.2 mmHg. Aortic valve area, by VTI measures 1.55 cm. Pulmonic Valve: The pulmonic valve was not well visualized. Pulmonic valve regurgitation is not visualized. No evidence of pulmonic stenosis. Aorta: The aortic root and ascending aorta are structurally normal, with no evidence of dilitation. Venous: The inferior vena cava is dilated in size with less than 50% respiratory variability, suggesting right atrial pressure of 15 mmHg. IAS/Shunts: No atrial level shunt detected by color flow Doppler.  LEFT VENTRICLE PLAX 2D LVIDd:         5.20 cm      Diastology LVIDs:         3.90 cm      LV e' medial:     3.26 cm/s LV PW:         1.00 cm      LV E/e' medial:  22.0 LV IVS:        0.90 cm      LV e' lateral:   4.35 cm/s LVOT diam:     2.00 cm      LV E/e' lateral: 16.5 LV SV:         51 LV SV Index:   23 LVOT Area:     3.14 cm LV IVRT:       274 msec  LV Volumes (MOD) LV vol d, MOD A2C: 83.9 ml LV vol d, MOD A4C: 112.0 ml LV vol s, MOD A2C: 52.0 ml LV vol s, MOD A4C: 67.0 ml LV SV MOD A2C:     31.9 ml LV SV MOD A4C:     112.0 ml LV SV MOD BP:      35.6 ml RIGHT VENTRICLE            IVC RV Basal diam:  3.10 cm    IVC diam: 2.60 cm RV S prime:     7.72 cm/s TAPSE (M-mode): 1.6 cm LEFT ATRIUM             Index        RIGHT ATRIUM           Index LA diam:        4.40 cm 2.00 cm/m   RA Area:     19.00 cm LA Vol (A2C):   65.1 ml 29.65 ml/m  RA Volume:   58.30 ml  26.55 ml/m LA Vol (A4C):   66.2 ml 30.15 ml/m LA Biplane Vol: 69.6 ml 31.70 ml/m  AORTIC VALVE                    PULMONIC VALVE AV Area (Vmax):    1.65 cm     PR End Diast Vel: 8.18 msec AV Area (Vmean):   1.70 cm AV Area (VTI):     1.55 cm AV Vmax:           134.38 cm/s AV Vmean:          82.392 cm/s AV VTI:            0.326 m AV Peak Grad:      7.2 mmHg AV Mean Grad:      3.3 mmHg LVOT Vmax:         70.40 cm/s LVOT Vmean:        44.700 cm/s LVOT VTI:          0.161 m LVOT/AV VTI ratio: 0.49  AORTA Ao Root diam: 3.00 cm Ao Asc diam:  3.40 cm MITRAL VALVE               TRICUSPID VALVE MV Area (PHT): 3.34 cm    TR Peak grad:   42.0 mmHg MV Decel Time: 227 msec    TR Vmax:        324.00 cm/s MV E velocity: 71.70 cm/s MV A velocity: 89.30 cm/s  SHUNTS MV E/A ratio:  0.80        Systemic VTI:  0.16 m                            Systemic Diam: 2.00 cm Dorn Ross MD Electronically signed by Dorn Ross MD Signature Date/Time: 07/20/2024/5:26:07 PM    Final    DG CHEST PORT 1 VIEW Result Date: 07/20/2024 CLINICAL DATA:  Ventilator dependence. EXAM: PORTABLE CHEST 1 VIEW COMPARISON:  07/19/2024 FINDINGS: Endotracheal tube tip is 1.4 cm above the base  of the carina. The NG tube passes into the stomach although the distal tip position is not included on the film. The cardio pericardial silhouette is enlarged. There is pulmonary vascular congestion without overt pulmonary edema. No overt airspace edema or pleural effusion. IMPRESSION: 1. Endotracheal tube tip is 1.4 cm above the base of the carina. 2. Pulmonary vascular congestion without overt pulmonary edema. Electronically Signed   By: Camellia Candle M.D.   On: 07/20/2024 10:16   DG Abd Portable 1 View Result Date: 07/19/2024 EXAM: 1 VIEW XRAY OF THE ABDOMEN 07/19/2024 08:14:00 PM COMPARISON: None available. CLINICAL HISTORY: OG tube placement FINDINGS: LINES, TUBES AND DEVICES: Enteric tube in place with tip overlying the midbody of the stomach. BOWEL: Distended stomach with air. Nonobstructive bowel gas pattern. SOFT TISSUES: Overlapping cardiac leads and defibrillator pads noted. No abnormal calcifications. BONES: No acute fracture. IMPRESSION: 1. Enteric tube in satisfactory position. Electronically signed by: Norman Gatlin MD 07/19/2024 08:23 PM EST RP Workstation: [id number]   CT Cervical Spine Wo Contrast Result Date: 07/19/2024 CLINICAL DATA:  Neck trauma (Age >= 65y) EXAM: CT CERVICAL SPINE WITHOUT CONTRAST TECHNIQUE: Multidetector CT imaging of the cervical spine was performed without intravenous contrast. Multiplanar CT image reconstructions were also generated. RADIATION DOSE REDUCTION: This exam was performed according to the departmental dose-optimization program which includes automated exposure control, adjustment of the mA and/or kV according to patient size and/or use of iterative reconstruction technique. COMPARISON:  None Available. FINDINGS: Alignment: Straightening of normal lordosis. Trace anterolisthesis of C3 on C4. No traumatic subluxation. Skull base and vertebrae: No acute fracture. Vertebral body heights are maintained. The dens and skull base are intact. Soft tissues and  spinal canal: No canal hematoma. Pharyngeal fluid may be related to intubation. There is patchy soft tissue gas within the left parapharyngeal and submandibular space with ill-defined fat planes. Disc levels: Mild diffuse degenerative disc disease. Mild multilevel facet hypertrophy. Upper chest: Assessed on concurrent chest CT, reported separately. Other: Carotid calcifications.  Patient is intubated. IMPRESSION: 1. No acute fracture or traumatic subluxation of the cervical spine. 2. Patchy soft tissue gas within the left parapharyngeal and submandibular space with ill-defined fat planes. This is of uncertain etiology, recommend correlation for laceration. No evidence of facial bone fracture to account for soft tissue gas. If there is clinical concern for infection, consider further assessment with soft tissue neck CT. Electronically Signed   By: Andrea Gasman M.D.   On: 07/19/2024 20:04  CT Angio Chest/Abd/Pel for Dissection W and/or Wo Contrast Result Date: 07/19/2024 EXAM: CTA CHEST, ABDOMEN AND PELVIS WITHOUT AND WITH CONTRAST 07/19/2024 07:45:09 PM TECHNIQUE: CTA of the chest was performed without and with the administration of 100 mL of intravenous iohexol  (OMNIPAQUE ) 350 MG/ML injection. CTA of the abdomen and pelvis was performed with the administration of 100 mL of intravenous iohexol  (OMNIPAQUE ) 350 MG/ML injection. Multiplanar reformatted images are provided for review. MIP images are provided for review. Automated exposure control, iterative reconstruction, and/or weight based adjustment of the mA/kV was utilized to reduce the radiation dose to as low as reasonably achievable. COMPARISON: Same day x-ray. CLINICAL HISTORY: Acute aortic syndrome (AAS) suspected. Unresponsiveness. Bradycardia. FINDINGS: VASCULATURE: AORTA: No acute aortic syndrome. Coronary artery and aortic atherosclerotic calcifications. No abdominal aortic aneurysm. No dissection. Reflux of contrast into the IVC and hepatic veins  compatible with elevated right heart pressures. PULMONARY ARTERIES: Negative for pulmonary embolism. Dilated main pulmonary artery measuring 34 mm which can be seen with pulmonary hypertension. GREAT VESSELS OF AORTIC ARCH: No acute finding. No dissection. No arterial occlusion or significant stenosis. CELIAC TRUNK: No acute finding. No occlusion or significant stenosis. SUPERIOR MESENTERIC ARTERY: No acute finding. No occlusion or significant stenosis. INFERIOR MESENTERIC ARTERY: No acute finding. No occlusion or significant stenosis. RENAL ARTERIES: No acute finding. No occlusion or significant stenosis. ILIAC ARTERIES: No acute finding. No occlusion or significant stenosis. CHEST: MEDIASTINUM: Cardiomegaly. No mediastinal lymphadenopathy. No pericardial effusion. LUNGS AND PLEURA: Endotracheal tube tip at the origin of the right mainstem bronchus at the level of the carina. Recommend retraction. Bronchovascular consolidation in the left lower lobe suspicious for aspiration or pneumonia. Scarring and atelectasis in the right lower lobe. No evidence of pleural effusion or pneumothorax. THORACIC BONES AND SOFT TISSUES: Body wall anasarca. No acute bone or soft tissue abnormality. ABDOMEN AND PELVIS: LIVER: No acute. GALLBLADDER AND BILE DUCTS: Gallbladder is unremarkable. No biliary ductal dilatation. SPLEEN: The spleen is unremarkable. PANCREAS: The pancreas is unremarkable. ADRENAL GLANDS: Bilateral adrenal glands demonstrate no acute abnormality. KIDNEYS, URETERS AND BLADDER: No stones in the kidneys or ureters. No hydronephrosis. No perinephric or periureteral stranding. Diffuse bladder wall thickening with perivesical stranding and fluid. Correlate for cystitis. GI AND BOWEL: Colonic diverticulosis without evidence of diverticulitis. Normal appendix. Stomach and duodenal sweep demonstrate no acute abnormality. There is no bowel obstruction. No abnormal bowel wall thickening or distension. REPRODUCTIVE:  Reproductive organs are unremarkable. PERITONEUM AND RETROPERITONEUM: Small volume abdominal or pelvic ascites. No free air. LYMPH NODES: No lymphadenopathy. ABDOMINAL BONES AND SOFT TISSUES: Body wall anasarca. No acute abnormality of the bones. No acute soft tissue abnormality. IMPRESSION: 1. Endotracheal tube tip at the origin of the right mainstem bronchus at the level of the carina. Recommend retraction. 2. No acute aortic syndrome or pulmonary embolism . 3. Cardiomegaly with signs heart failure and of 3rd spacing of fluid including body wall anasarca, abdominal pelvic ascites, and reflux with contrast into the IVC and hepatic veins. 4. Dilated main pulmonary artery measuring 34 mm which can be seen with pulmonary hypertension. 5. Bronchovascular consolidation in the left lower lobe suspicious for aspiration or pneumonia. 6. Diffuse bladder wall thickening with perivesical stranding and fluid. Correlate for cystitis. Electronically signed by: Norman Gatlin MD 07/19/2024 08:04 PM EST RP Workstation: [id number]   CT Head Wo Contrast Result Date: 07/19/2024 CLINICAL DATA:  Provided history: Head trauma, minor (Age >= 65y) EXAM: CT HEAD WITHOUT CONTRAST TECHNIQUE: Contiguous axial images were obtained from the base  of the skull through the vertex without intravenous contrast. RADIATION DOSE REDUCTION: This exam was performed according to the departmental dose-optimization program which includes automated exposure control, adjustment of the mA and/or kV according to patient size and/or use of iterative reconstruction technique. COMPARISON:  None Available. FINDINGS: Brain: No intracranial hemorrhage, mass effect, or midline shift. Brain volume is normal for age. No hydrocephalus. The basilar cisterns are patent. Periventricular and deep white matter hypodensity, nonspecific but typical of chronic small vessel ischemia. Faint symmetric basal gangliar mineralization. No evidence of territorial infarct or acute  ischemia. No extra-axial or intracranial fluid collection. Vascular: Atherosclerosis of skullbase vasculature without hyperdense vessel or abnormal calcification. Skull: No fracture or focal lesion. Sinuses/Orbits: Small mucous retention cyst in the right maxillary sinus. Bilateral cataract resection Other: None. IMPRESSION: 1. No acute intracranial abnormality. No skull fracture. 2. Chronic small vessel ischemia. Electronically Signed   By: Andrea Gasman M.D.   On: 07/19/2024 19:58   DG Chest Portable 1 View Result Date: 07/19/2024 CLINICAL DATA:  Chest pain. EXAM: PORTABLE CHEST 1 VIEW COMPARISON:  Chest radiograph dated 08/09/2023. FINDINGS: Endotracheal tube approximately 2 cm above the carina. Shallow inspiration. There is cardiomegaly. No focal consolidation, pleural effusion, pneumothorax. No acute osseous pathology. IMPRESSION: 1. Endotracheal tube above the carina. 2. Cardiomegaly. Electronically Signed   By: Vanetta Chou M.D.   On: 07/19/2024 19:17    Microbiology Recent Results (from the past 240 hours)  Resp panel by RT-PCR (RSV, Flu A&B, Covid) Anterior Nasal Swab     Status: None   Collection Time: 07/19/24  8:00 PM   Specimen: Anterior Nasal Swab  Result Value Ref Range Status   SARS Coronavirus 2 by RT PCR NEGATIVE NEGATIVE Final   Influenza A by PCR NEGATIVE NEGATIVE Final   Influenza B by PCR NEGATIVE NEGATIVE Final    Comment: (NOTE) The Xpert Xpress SARS-CoV-2/FLU/RSV plus assay is intended as an aid in the diagnosis of influenza from Nasopharyngeal swab specimens and should not be used as a sole basis for treatment. Nasal washings and aspirates are unacceptable for Xpert Xpress SARS-CoV-2/FLU/RSV testing.  Fact Sheet for Patients: bloggercourse.com  Fact Sheet for Healthcare Providers: seriousbroker.it  This test is not yet approved or cleared by the [place]  FDA and has been authorized for detection  and/or diagnosis of SARS-CoV-2 by FDA under an Emergency Use Authorization (EUA). This EUA will remain in effect (meaning this test can be used) for the duration of the COVID-19 declaration under Section 564(b)(1) of the Act, 21 U.S.C. section 360bbb-3(b)(1), unless the authorization is terminated or revoked.     Resp Syncytial Virus by PCR NEGATIVE NEGATIVE Final    Comment: (NOTE) Fact Sheet for Patients: bloggercourse.com  Fact Sheet for Healthcare Providers: seriousbroker.it  This test is not yet approved or cleared by the [place]  FDA and has been authorized for detection and/or diagnosis of SARS-CoV-2 by FDA under an Emergency Use Authorization (EUA). This EUA will remain in effect (meaning this test can be used) for the duration of the COVID-19 declaration under Section 564(b)(1) of the Act, 21 U.S.C. section 360bbb-3(b)(1), unless the authorization is terminated or revoked.  Performed at [facility], [address], [place], [place] [address]   MRSA Next Gen by PCR, Nasal     Status: None   Collection Time: 07/19/24  8:13 PM   Specimen: Nasal Mucosa; Nasal Swab  Result Value Ref Range Status   MRSA by PCR Next Gen NOT DETECTED NOT  DETECTED Final    Comment: (NOTE) The GeneXpert MRSA Assay (FDA approved for NASAL specimens only), is one component of a comprehensive MRSA colonization surveillance program. It is not intended to diagnose MRSA infection nor to guide or monitor treatment for MRSA infections. Test performance is not FDA approved in patients less than 12 years old. Performed at [facility], [address] [place], [place] [address]     Lab Basic Metabolic Panel: Recent Labs  Lab 07/19/24 1852 07/19/24 1904 07/19/24 2111 07/19/24 2239 07/20/24 0305 07/20/24 0353 07/21/24 0016 07/21/24 0413 07/21/24 1600 07/21/24 2002 07/21/24 2307  NA 141 143  143   < >  --  141   < > 140  141 140 141 139  K 4.2 4.2  4.2   < >  --  4.1   < > 4.7 4.2 4.4 4.2 3.9  CL 106 111  --   --  109  --  106 106 104  --   --   CO2 16*  --   --   --  21*  --  20* 20* 13*  --   --   GLUCOSE 251* 245*  --   --  205*  --  122* 126* 80  --   --   BUN 45* 56*  --   --  46*  --  51* 52* 55*  --   --   CREATININE 3.07* 3.10*  --  2.95* 3.00*  --  3.73* 3.88* 4.36*  --   --   CALCIUM  8.9  --   --   --  8.4*  --  9.1 9.1 9.5  --   --   MG  --   --   --   --  2.0  --  2.2 2.2  --   --   --   PHOS  --   --   --   --  4.8*  --  5.1*  --  6.3*  --   --    < > = values in this interval not displayed.   Liver Function Tests: Recent Labs  Lab 07/19/24 1852 07/21/24 0413 07/21/24 1600  AST 59* 38  --   ALT 28 24  --   ALKPHOS 246* 206*  --   BILITOT 1.1 0.9  --   PROT 7.8 7.2  --   ALBUMIN 3.8 3.6 3.7   No results for input(s): LIPASE, AMYLASE in the last 168 hours. Recent Labs  Lab 07/20/24 1052  AMMONIA 64*   CBC: Recent Labs  Lab 07/19/24 1852 07/19/24 1904 07/19/24 2239 07/20/24 0305 07/20/24 0353 07/21/24 0016 07/21/24 2002 07/21/24 2307  WBC 7.8  --  7.1 6.4  --  11.8*  --   --   NEUTROABS 6.2  --   --   --   --   --   --   --   HGB 9.8*   < > 8.1* 8.3* 9.5* 9.8* 10.9* 10.5*  HCT 32.4*   < > 26.3* 26.7* 28.0* 31.0* 32.0* 31.0*  MCV 74.0*  --  72.3* 72.8*  --  71.6*  --   --   PLT 196  --  142* 118*  --  173  --   --    < > = values in this interval not displayed.   Cardiac Enzymes: No results for input(s): CKTOTAL, CKMB, CKMBINDEX, TROPONINI in the last 168 hours. Sepsis Labs: Recent Labs  Lab 07/19/24 [phone]  07/19/24 1905 07/19/24 2239 07/20/24 0305 07/20/24 1052 07/20/24 1259 07/21/24 0016 07/21/24 2031 07/21/24 2315  WBC 7.8  --  7.1 6.4  --   --  11.8*  --   --   LATICACIDVEN  --    < >  --   --  1.6 1.5  --  >9.0* >9.0*   < > = values in this interval not displayed.    Procedures/Operations  ***   Lainie Daubert Slater Staff 07/26/2024, 1:07  PM   "

## 2024-07-27 NOTE — Progress Notes (Signed)
 Responded to CODE BLUE. Pt has central access and peripheral access in place.

## 2024-07-27 DEATH — deceased

## 2024-07-28 DIAGNOSIS — I50810 Right heart failure, unspecified: Secondary | ICD-10-CM | POA: Insufficient documentation

## 2024-07-28 DIAGNOSIS — I5040 Unspecified combined systolic (congestive) and diastolic (congestive) heart failure: Secondary | ICD-10-CM | POA: Insufficient documentation

## 2024-07-28 DIAGNOSIS — D696 Thrombocytopenia, unspecified: Secondary | ICD-10-CM | POA: Insufficient documentation

## 2024-07-28 DIAGNOSIS — I2721 Secondary pulmonary arterial hypertension: Secondary | ICD-10-CM | POA: Insufficient documentation

## 2024-07-28 DIAGNOSIS — G9341 Metabolic encephalopathy: Secondary | ICD-10-CM | POA: Insufficient documentation

## 2024-07-28 DIAGNOSIS — J690 Pneumonitis due to inhalation of food and vomit: Secondary | ICD-10-CM | POA: Insufficient documentation
